# Patient Record
Sex: Female | Born: 1935 | Race: White | Hispanic: No | Smoking: Former smoker
Health system: Southern US, Community
[De-identification: ages and names within clinical notes are randomized; demographics above are authoritative.]

## PROBLEM LIST (undated history)

## (undated) DIAGNOSIS — Z9221 Personal history of antineoplastic chemotherapy: Secondary | ICD-10-CM

## (undated) DIAGNOSIS — H9190 Unspecified hearing loss, unspecified ear: Secondary | ICD-10-CM

## (undated) DIAGNOSIS — C139 Malignant neoplasm of hypopharynx, unspecified: Secondary | ICD-10-CM

## (undated) DIAGNOSIS — K5790 Diverticulosis of intestine, part unspecified, without perforation or abscess without bleeding: Secondary | ICD-10-CM

## (undated) DIAGNOSIS — C801 Malignant (primary) neoplasm, unspecified: Secondary | ICD-10-CM

## (undated) DIAGNOSIS — Z923 Personal history of irradiation: Secondary | ICD-10-CM

## (undated) DIAGNOSIS — C189 Malignant neoplasm of colon, unspecified: Secondary | ICD-10-CM

## (undated) DIAGNOSIS — K649 Unspecified hemorrhoids: Secondary | ICD-10-CM

## (undated) DIAGNOSIS — T884XXA Failed or difficult intubation, initial encounter: Secondary | ICD-10-CM

## (undated) DIAGNOSIS — Z8619 Personal history of other infectious and parasitic diseases: Secondary | ICD-10-CM

## (undated) DIAGNOSIS — M1990 Unspecified osteoarthritis, unspecified site: Secondary | ICD-10-CM

## (undated) DIAGNOSIS — R0600 Dyspnea, unspecified: Secondary | ICD-10-CM

## (undated) DIAGNOSIS — I10 Essential (primary) hypertension: Secondary | ICD-10-CM

## (undated) DIAGNOSIS — M8580 Other specified disorders of bone density and structure, unspecified site: Secondary | ICD-10-CM

## (undated) HISTORY — PX: ABDOMINAL HYSTERECTOMY: SHX81

## (undated) HISTORY — PX: JOINT REPLACEMENT: SHX530

## (undated) HISTORY — DX: Malignant neoplasm of hypopharynx, unspecified: C13.9

## (undated) HISTORY — DX: Malignant neoplasm of colon, unspecified: C18.9

## (undated) HISTORY — PX: BREAST CYST ASPIRATION: SHX578

## (undated) HISTORY — PX: APPENDECTOMY: SHX54

## (undated) HISTORY — DX: Diverticulosis of intestine, part unspecified, without perforation or abscess without bleeding: K57.90

## (undated) HISTORY — PX: BREAST SURGERY: SHX581

## (undated) HISTORY — PX: TONSILLECTOMY: SUR1361

## (undated) HISTORY — DX: Other specified disorders of bone density and structure, unspecified site: M85.80

## (undated) HISTORY — PX: COLONOSCOPY: SHX174

## (undated) HISTORY — DX: Personal history of other infectious and parasitic diseases: Z86.19

## (undated) HISTORY — DX: Unspecified hemorrhoids: K64.9

---
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
## 1958-11-02 HISTORY — PX: BREAST SURGERY: SHX581

## 1978-11-02 HISTORY — PX: ABDOMINAL HYSTERECTOMY: SHX81

## 2004-08-25 ENCOUNTER — Ambulatory Visit: Payer: Self-pay | Admitting: Family Medicine

## 2005-09-16 ENCOUNTER — Ambulatory Visit: Payer: Self-pay | Admitting: Family Medicine

## 2006-10-07 ENCOUNTER — Ambulatory Visit: Payer: Self-pay | Admitting: General Surgery

## 2007-10-14 LAB — HM DEXA SCAN

## 2007-11-09 ENCOUNTER — Ambulatory Visit: Payer: Self-pay | Admitting: General Surgery

## 2008-03-10 ENCOUNTER — Other Ambulatory Visit: Payer: Self-pay

## 2008-03-11 ENCOUNTER — Inpatient Hospital Stay: Payer: Self-pay | Admitting: Surgery

## 2008-03-11 HISTORY — PX: APPENDECTOMY: SHX54

## 2008-03-24 ENCOUNTER — Inpatient Hospital Stay: Payer: Self-pay | Admitting: Surgery

## 2008-04-02 ENCOUNTER — Emergency Department: Payer: Self-pay | Admitting: Emergency Medicine

## 2008-04-11 ENCOUNTER — Ambulatory Visit: Payer: Self-pay | Admitting: Family Medicine

## 2008-11-14 ENCOUNTER — Ambulatory Visit: Payer: Self-pay | Admitting: General Surgery

## 2009-12-30 ENCOUNTER — Ambulatory Visit: Payer: Self-pay | Admitting: Family Medicine

## 2010-11-02 DIAGNOSIS — C801 Malignant (primary) neoplasm, unspecified: Secondary | ICD-10-CM

## 2010-11-02 HISTORY — DX: Malignant (primary) neoplasm, unspecified: C80.1

## 2011-02-12 ENCOUNTER — Ambulatory Visit: Payer: Self-pay | Admitting: Family Medicine

## 2012-02-17 DIAGNOSIS — H2510 Age-related nuclear cataract, unspecified eye: Secondary | ICD-10-CM | POA: Diagnosis not present

## 2012-03-04 DIAGNOSIS — R5383 Other fatigue: Secondary | ICD-10-CM | POA: Diagnosis not present

## 2012-03-04 DIAGNOSIS — M899 Disorder of bone, unspecified: Secondary | ICD-10-CM | POA: Diagnosis not present

## 2012-03-04 DIAGNOSIS — M949 Disorder of cartilage, unspecified: Secondary | ICD-10-CM | POA: Diagnosis not present

## 2012-03-04 DIAGNOSIS — R5381 Other malaise: Secondary | ICD-10-CM | POA: Diagnosis not present

## 2012-03-04 DIAGNOSIS — I10 Essential (primary) hypertension: Secondary | ICD-10-CM | POA: Diagnosis not present

## 2012-03-04 DIAGNOSIS — E559 Vitamin D deficiency, unspecified: Secondary | ICD-10-CM | POA: Diagnosis not present

## 2012-03-04 DIAGNOSIS — L989 Disorder of the skin and subcutaneous tissue, unspecified: Secondary | ICD-10-CM | POA: Diagnosis not present

## 2012-03-10 ENCOUNTER — Ambulatory Visit: Payer: Self-pay | Admitting: Family Medicine

## 2012-03-10 DIAGNOSIS — Z1231 Encounter for screening mammogram for malignant neoplasm of breast: Secondary | ICD-10-CM | POA: Diagnosis not present

## 2012-04-28 DIAGNOSIS — L821 Other seborrheic keratosis: Secondary | ICD-10-CM | POA: Diagnosis not present

## 2012-05-03 ENCOUNTER — Ambulatory Visit: Payer: Self-pay | Admitting: Family Medicine

## 2012-05-03 DIAGNOSIS — J449 Chronic obstructive pulmonary disease, unspecified: Secondary | ICD-10-CM | POA: Diagnosis not present

## 2012-05-03 DIAGNOSIS — J40 Bronchitis, not specified as acute or chronic: Secondary | ICD-10-CM | POA: Diagnosis not present

## 2012-05-03 DIAGNOSIS — K649 Unspecified hemorrhoids: Secondary | ICD-10-CM | POA: Diagnosis not present

## 2012-05-03 DIAGNOSIS — R059 Cough, unspecified: Secondary | ICD-10-CM | POA: Diagnosis not present

## 2012-05-03 DIAGNOSIS — R195 Other fecal abnormalities: Secondary | ICD-10-CM | POA: Diagnosis not present

## 2012-05-03 DIAGNOSIS — R05 Cough: Secondary | ICD-10-CM | POA: Diagnosis not present

## 2012-05-24 DIAGNOSIS — K922 Gastrointestinal hemorrhage, unspecified: Secondary | ICD-10-CM | POA: Diagnosis not present

## 2012-08-02 DIAGNOSIS — J40 Bronchitis, not specified as acute or chronic: Secondary | ICD-10-CM | POA: Diagnosis not present

## 2012-08-02 DIAGNOSIS — Z23 Encounter for immunization: Secondary | ICD-10-CM | POA: Diagnosis not present

## 2012-08-02 DIAGNOSIS — R05 Cough: Secondary | ICD-10-CM | POA: Diagnosis not present

## 2012-12-22 DIAGNOSIS — R197 Diarrhea, unspecified: Secondary | ICD-10-CM | POA: Diagnosis not present

## 2012-12-26 DIAGNOSIS — K529 Noninfective gastroenteritis and colitis, unspecified: Secondary | ICD-10-CM | POA: Diagnosis not present

## 2012-12-26 LAB — HEPATIC FUNCTION PANEL
ALT: 12 U/L (ref 7–35)
AST: 20 U/L (ref 13–35)

## 2012-12-26 LAB — CBC AND DIFFERENTIAL
HEMATOCRIT: 42 % (ref 36–46)
HEMOGLOBIN: 13.8 g/dL (ref 12.0–16.0)
Platelets: 253 10*3/uL (ref 150–399)
WBC: 7.5 10^3/mL

## 2012-12-26 LAB — BASIC METABOLIC PANEL
BUN: 37 mg/dL — AB (ref 4–21)
Creatinine: 1.7 mg/dL — AB (ref 0.5–1.1)
Glucose: 118 mg/dL
Potassium: 4.1 mmol/L (ref 3.4–5.3)
SODIUM: 139 mmol/L (ref 137–147)

## 2012-12-26 LAB — HM SIGMOIDOSCOPY

## 2012-12-30 DIAGNOSIS — K922 Gastrointestinal hemorrhage, unspecified: Secondary | ICD-10-CM | POA: Diagnosis not present

## 2012-12-31 ENCOUNTER — Ambulatory Visit: Payer: Self-pay | Admitting: Oncology

## 2013-01-02 ENCOUNTER — Inpatient Hospital Stay: Payer: Self-pay | Admitting: Internal Medicine

## 2013-01-02 DIAGNOSIS — E876 Hypokalemia: Secondary | ICD-10-CM | POA: Diagnosis present

## 2013-01-02 DIAGNOSIS — N3000 Acute cystitis without hematuria: Secondary | ICD-10-CM | POA: Diagnosis not present

## 2013-01-02 DIAGNOSIS — D62 Acute posthemorrhagic anemia: Secondary | ICD-10-CM | POA: Diagnosis present

## 2013-01-02 DIAGNOSIS — K5731 Diverticulosis of large intestine without perforation or abscess with bleeding: Secondary | ICD-10-CM | POA: Diagnosis not present

## 2013-01-02 DIAGNOSIS — C20 Malignant neoplasm of rectum: Secondary | ICD-10-CM | POA: Diagnosis present

## 2013-01-02 DIAGNOSIS — K5289 Other specified noninfective gastroenteritis and colitis: Secondary | ICD-10-CM | POA: Diagnosis not present

## 2013-01-02 DIAGNOSIS — E860 Dehydration: Secondary | ICD-10-CM | POA: Diagnosis not present

## 2013-01-02 DIAGNOSIS — R109 Unspecified abdominal pain: Secondary | ICD-10-CM | POA: Diagnosis not present

## 2013-01-02 DIAGNOSIS — D126 Benign neoplasm of colon, unspecified: Secondary | ICD-10-CM | POA: Diagnosis present

## 2013-01-02 LAB — URINALYSIS, COMPLETE
Bilirubin,UR: NEGATIVE
Hyaline Cast: 14
Ketone: NEGATIVE
Protein: NEGATIVE

## 2013-01-02 LAB — PROTIME-INR: INR: 0.9

## 2013-01-02 LAB — CBC
HCT: 44.1 % (ref 35.0–47.0)
HGB: 14.4 g/dL (ref 12.0–16.0)
MCH: 30.5 pg (ref 26.0–34.0)
MCHC: 32.6 g/dL (ref 32.0–36.0)
Platelet: 261 10*3/uL (ref 150–440)
RDW: 13.1 % (ref 11.5–14.5)
WBC: 18.1 10*3/uL — ABNORMAL HIGH (ref 3.6–11.0)

## 2013-01-02 LAB — COMPREHENSIVE METABOLIC PANEL
Albumin: 2.8 g/dL — ABNORMAL LOW (ref 3.4–5.0)
Alkaline Phosphatase: 79 U/L (ref 50–136)
Anion Gap: 12 (ref 7–16)
BUN: 27 mg/dL — ABNORMAL HIGH (ref 7–18)
Bilirubin,Total: 0.3 mg/dL (ref 0.2–1.0)
Calcium, Total: 7.9 mg/dL — ABNORMAL LOW (ref 8.5–10.1)
Co2: 23 mmol/L (ref 21–32)
Creatinine: 1.1 mg/dL (ref 0.60–1.30)
EGFR (African American): 56 — ABNORMAL LOW
EGFR (Non-African Amer.): 48 — ABNORMAL LOW
Osmolality: 284 (ref 275–301)
Potassium: 2.2 mmol/L — CL (ref 3.5–5.1)
SGOT(AST): 25 U/L (ref 15–37)
SGPT (ALT): 14 U/L (ref 12–78)

## 2013-01-02 LAB — MAGNESIUM: Magnesium: 2.5 mg/dL — ABNORMAL HIGH

## 2013-01-03 LAB — BASIC METABOLIC PANEL
Creatinine: 1.57 mg/dL — ABNORMAL HIGH (ref 0.60–1.30)
EGFR (African American): 36 — ABNORMAL LOW
EGFR (Non-African Amer.): 31 — ABNORMAL LOW
Osmolality: 280 (ref 275–301)
Potassium: 3.4 mmol/L — ABNORMAL LOW (ref 3.5–5.1)
Sodium: 135 mmol/L — ABNORMAL LOW (ref 136–145)

## 2013-01-03 LAB — CBC WITH DIFFERENTIAL/PLATELET
Basophil #: 0 10*3/uL (ref 0.0–0.1)
Basophil %: 0.1 %
Eosinophil %: 0.1 %
HCT: 31 % — ABNORMAL LOW (ref 35.0–47.0)
HGB: 12.8 g/dL (ref 12.0–16.0)
Lymphocyte #: 1.4 10*3/uL (ref 1.0–3.6)
MCH: 30.9 pg (ref 26.0–34.0)
MCH: 32.2 pg (ref 26.0–34.0)
MCHC: 34.5 g/dL (ref 32.0–36.0)
MCV: 93 fL (ref 80–100)
MCV: 93 fL (ref 80–100)
Monocyte #: 0.9 x10 3/mm (ref 0.2–0.9)
Monocyte #: 1.1 x10 3/mm — ABNORMAL HIGH (ref 0.2–0.9)
Monocyte %: 6.5 %
Monocyte %: 9.7 %
Neutrophil #: 11.2 10*3/uL — ABNORMAL HIGH (ref 1.4–6.5)
Neutrophil %: 82.9 %
Neutrophil %: 76.5 %
RBC: 4.13 10*6/uL (ref 3.80–5.20)
RDW: 13.1 % (ref 11.5–14.5)
WBC: 11.1 10*3/uL — ABNORMAL HIGH (ref 3.6–11.0)

## 2013-01-03 LAB — HEMOGLOBIN: HGB: 11.8 g/dL — ABNORMAL LOW (ref 12.0–16.0)

## 2013-01-04 LAB — CBC WITH DIFFERENTIAL/PLATELET
Basophil #: 0 10*3/uL (ref 0.0–0.1)
Basophil %: 0.3 %
Eosinophil %: 0.4 %
HCT: 28.2 % — ABNORMAL LOW (ref 35.0–47.0)
HGB: 10.1 g/dL — ABNORMAL LOW (ref 12.0–16.0)
Lymphocyte #: 1.6 10*3/uL (ref 1.0–3.6)
Lymphocyte %: 16.7 %
Monocyte #: 0.9 x10 3/mm (ref 0.2–0.9)
Neutrophil #: 7.1 10*3/uL — ABNORMAL HIGH (ref 1.4–6.5)
RBC: 3.03 10*6/uL — ABNORMAL LOW (ref 3.80–5.20)
RDW: 13.2 % (ref 11.5–14.5)
WBC: 9.7 10*3/uL (ref 3.6–11.0)

## 2013-01-04 LAB — BASIC METABOLIC PANEL
Anion Gap: 8 (ref 7–16)
BUN: 18 mg/dL (ref 7–18)
Calcium, Total: 8.6 mg/dL (ref 8.5–10.1)
Chloride: 106 mmol/L (ref 98–107)
Co2: 25 mmol/L (ref 21–32)
Creatinine: 1.37 mg/dL — ABNORMAL HIGH (ref 0.60–1.30)
Glucose: 136 mg/dL — ABNORMAL HIGH (ref 65–99)
Osmolality: 282 (ref 275–301)
Potassium: 3 mmol/L — ABNORMAL LOW (ref 3.5–5.1)
Sodium: 139 mmol/L (ref 136–145)

## 2013-01-05 LAB — BASIC METABOLIC PANEL
Anion Gap: 7 (ref 7–16)
Chloride: 111 mmol/L — ABNORMAL HIGH (ref 98–107)
Creatinine: 1.35 mg/dL — ABNORMAL HIGH (ref 0.60–1.30)
EGFR (African American): 44 — ABNORMAL LOW
Glucose: 119 mg/dL — ABNORMAL HIGH (ref 65–99)
Potassium: 3.3 mmol/L — ABNORMAL LOW (ref 3.5–5.1)
Sodium: 139 mmol/L (ref 136–145)

## 2013-01-05 LAB — CBC WITH DIFFERENTIAL/PLATELET
Basophil #: 0 10*3/uL (ref 0.0–0.1)
Basophil %: 0.3 %
HCT: 27.7 % — ABNORMAL LOW (ref 35.0–47.0)
Lymphocyte #: 1.9 10*3/uL (ref 1.0–3.6)
MCHC: 33.6 g/dL (ref 32.0–36.0)
MCV: 94 fL (ref 80–100)
Monocyte #: 0.9 x10 3/mm (ref 0.2–0.9)
Monocyte %: 10 %
RBC: 2.96 10*6/uL — ABNORMAL LOW (ref 3.80–5.20)
RDW: 13.2 % (ref 11.5–14.5)
WBC: 8.9 10*3/uL (ref 3.6–11.0)

## 2013-01-05 LAB — HM COLONOSCOPY

## 2013-01-06 LAB — CBC WITH DIFFERENTIAL/PLATELET
Eosinophil #: 0.2 10*3/uL (ref 0.0–0.7)
Eosinophil %: 1.9 %
HCT: 26.5 % — ABNORMAL LOW (ref 35.0–47.0)
HGB: 8.9 g/dL — ABNORMAL LOW (ref 12.0–16.0)
Lymphocyte %: 27.3 %
MCHC: 33.7 g/dL (ref 32.0–36.0)
MCV: 94 fL (ref 80–100)
Neutrophil #: 4.6 10*3/uL (ref 1.4–6.5)
RBC: 2.84 10*6/uL — ABNORMAL LOW (ref 3.80–5.20)
RDW: 14 % (ref 11.5–14.5)
WBC: 7.7 10*3/uL (ref 3.6–11.0)

## 2013-01-06 LAB — BASIC METABOLIC PANEL
Anion Gap: 10 (ref 7–16)
BUN: 7 mg/dL (ref 7–18)
Calcium, Total: 8.3 mg/dL — ABNORMAL LOW (ref 8.5–10.1)
Creatinine: 1.2 mg/dL (ref 0.60–1.30)
EGFR (African American): 50 — ABNORMAL LOW
EGFR (Non-African Amer.): 44 — ABNORMAL LOW
Osmolality: 281 (ref 275–301)
Potassium: 4.1 mmol/L (ref 3.5–5.1)
Sodium: 142 mmol/L (ref 136–145)

## 2013-01-07 LAB — CBC WITH DIFFERENTIAL/PLATELET
Basophil #: 0 10*3/uL (ref 0.0–0.1)
Basophil %: 0.4 %
MCV: 93 fL (ref 80–100)
Monocyte #: 0.8 x10 3/mm (ref 0.2–0.9)
Monocyte %: 11.5 %
Neutrophil #: 3.9 10*3/uL (ref 1.4–6.5)
Neutrophil %: 59.2 %
Platelet: 175 10*3/uL (ref 150–440)
RBC: 2.84 10*6/uL — ABNORMAL LOW (ref 3.80–5.20)
RDW: 13.5 % (ref 11.5–14.5)
WBC: 6.6 10*3/uL (ref 3.6–11.0)

## 2013-01-09 LAB — CBC WITH DIFFERENTIAL/PLATELET
Basophil %: 0.6 %
Eosinophil %: 1.9 %
HCT: 26.2 % — ABNORMAL LOW (ref 35.0–47.0)
Lymphocyte #: 1.7 10*3/uL (ref 1.0–3.6)
Lymphocyte %: 26.5 %
MCH: 31.7 pg (ref 26.0–34.0)
MCV: 93 fL (ref 80–100)
Monocyte #: 0.8 x10 3/mm (ref 0.2–0.9)
Monocyte %: 13.1 %
Neutrophil #: 3.7 10*3/uL (ref 1.4–6.5)
Platelet: 184 10*3/uL (ref 150–440)
RBC: 2.81 10*6/uL — ABNORMAL LOW (ref 3.80–5.20)
RDW: 13.6 % (ref 11.5–14.5)
WBC: 6.4 10*3/uL (ref 3.6–11.0)

## 2013-01-09 LAB — CREATININE, SERUM: EGFR (African American): 48 — ABNORMAL LOW

## 2013-01-11 ENCOUNTER — Ambulatory Visit: Payer: Self-pay | Admitting: Oncology

## 2013-01-11 DIAGNOSIS — Z79899 Other long term (current) drug therapy: Secondary | ICD-10-CM | POA: Diagnosis not present

## 2013-01-11 DIAGNOSIS — K649 Unspecified hemorrhoids: Secondary | ICD-10-CM | POA: Diagnosis not present

## 2013-01-11 DIAGNOSIS — M7989 Other specified soft tissue disorders: Secondary | ICD-10-CM | POA: Diagnosis not present

## 2013-01-11 DIAGNOSIS — K5730 Diverticulosis of large intestine without perforation or abscess without bleeding: Secondary | ICD-10-CM | POA: Diagnosis not present

## 2013-01-11 DIAGNOSIS — I10 Essential (primary) hypertension: Secondary | ICD-10-CM | POA: Diagnosis not present

## 2013-01-11 DIAGNOSIS — C20 Malignant neoplasm of rectum: Secondary | ICD-10-CM | POA: Diagnosis not present

## 2013-01-11 LAB — PATHOLOGY REPORT

## 2013-01-16 ENCOUNTER — Other Ambulatory Visit: Payer: Self-pay

## 2013-01-16 ENCOUNTER — Telehealth: Payer: Self-pay

## 2013-01-16 NOTE — Telephone Encounter (Signed)
Pt has been instructed and meds reviewed paperwork was also faxed to her son Jonny Ruiz at [phone] per pt request Son is also aware of the appt

## 2013-01-20 ENCOUNTER — Ambulatory Visit (HOSPITAL_COMMUNITY)
Admission: RE | Admit: 2013-01-20 | Discharge: 2013-01-20 | Disposition: A | Discharge status: Home / Self Care | Payer: Medicare Other | Source: Ambulatory Visit | Attending: Gastroenterology | Admitting: Gastroenterology

## 2013-01-20 ENCOUNTER — Encounter (HOSPITAL_COMMUNITY): Payer: Self-pay | Admitting: *Deleted

## 2013-01-20 ENCOUNTER — Encounter (HOSPITAL_COMMUNITY)
Admission: RE | Disposition: A | Discharge status: Home / Self Care | Payer: Self-pay | Source: Ambulatory Visit | Attending: Gastroenterology

## 2013-01-20 DIAGNOSIS — K625 Hemorrhage of anus and rectum: Secondary | ICD-10-CM | POA: Insufficient documentation

## 2013-01-20 DIAGNOSIS — Z9089 Acquired absence of other organs: Secondary | ICD-10-CM | POA: Insufficient documentation

## 2013-01-20 DIAGNOSIS — I10 Essential (primary) hypertension: Secondary | ICD-10-CM | POA: Diagnosis not present

## 2013-01-20 DIAGNOSIS — D129 Benign neoplasm of anus and anal canal: Secondary | ICD-10-CM | POA: Diagnosis not present

## 2013-01-20 DIAGNOSIS — R198 Other specified symptoms and signs involving the digestive system and abdomen: Secondary | ICD-10-CM | POA: Diagnosis not present

## 2013-01-20 DIAGNOSIS — C20 Malignant neoplasm of rectum: Secondary | ICD-10-CM | POA: Diagnosis not present

## 2013-01-20 DIAGNOSIS — D128 Benign neoplasm of rectum: Secondary | ICD-10-CM | POA: Diagnosis not present

## 2013-01-20 HISTORY — DX: Unspecified osteoarthritis, unspecified site: M19.90

## 2013-01-20 HISTORY — DX: Malignant (primary) neoplasm, unspecified: C80.1

## 2013-01-20 HISTORY — PX: EUS: SHX5427

## 2013-01-20 HISTORY — DX: Essential (primary) hypertension: I10

## 2013-01-20 SURGERY — ULTRASOUND, LOWER GI TRACT, ENDOSCOPIC
Anesthesia: Moderate Sedation

## 2013-01-20 MED ORDER — SODIUM CHLORIDE 0.9 % IV SOLN
INTRAVENOUS | Status: DC
  Administered 2013-01-20: 500 mL via INTRAVENOUS

## 2013-01-20 MED ORDER — FENTANYL CITRATE 0.05 MG/ML IJ SOLN
INTRAMUSCULAR | Status: AC
  Filled 2013-01-20: qty 2

## 2013-01-20 MED ORDER — FENTANYL CITRATE 0.05 MG/ML IJ SOLN
INTRAMUSCULAR | Status: DC | PRN
  Administered 2013-01-20: 25 ug via INTRAVENOUS

## 2013-01-20 MED ORDER — MIDAZOLAM HCL 10 MG/2ML IJ SOLN
INTRAMUSCULAR | Status: DC | PRN
  Administered 2013-01-20: 2 mg via INTRAVENOUS

## 2013-01-20 MED ORDER — MIDAZOLAM HCL 10 MG/2ML IJ SOLN
INTRAMUSCULAR | Status: AC
  Filled 2013-01-20: qty 2

## 2013-01-20 MED ORDER — FLEET ENEMA 7-19 GM/118ML RE ENEM: 1.0000 | ENEMA | Freq: Once | RECTAL | Status: DC

## 2013-01-20 NOTE — OR Nursing (Signed)
Patient arrived at 9:00 am for her 12:00 appt stating that she had a half teaspoon of blood in her stool after drinking Mag citrate and wanted to be in a controlled environment for the enema.  Spoke with Clayborne Dana at [facility] who wanted the enemas given 1 hour prior to the procedure.  The patient is admitted to the department at 9:15am with the enemas administered at 9:45am.  Results - small amount of blood in toilet.  Patient tolerated enemas with minimal amount of discomfort.

## 2013-01-20 NOTE — H&P (Signed)
  HPI: This is a woman with rectal bleeding, underwent flex sig earlier this month, found to have several polyps (Iftikhar) and malignant apperaing rectal mass. PET showed no distant disease.    Past Medical History  Diagnosis Date  . Hypertension   . Cancer     rectal  . Arthritis     fingers    Past Surgical History  Procedure Laterality Date  . Breast surgery      cyst removal  . Appendectomy    . Abdominal hysterectomy    . Tonsillectomy      Current Facility-Administered Medications  Medication Dose Route Frequency Provider Last Rate Last Dose  . 0.9 %  sodium chloride infusion   Intravenous Continuous Rachael Fee, MD      . sodium phosphate (FLEET) 7-19 GM/118ML enema 1 enema  1 enema Rectal Once Rachael Fee, MD        Allergies as of 01/16/2013  . (Not on File)    History reviewed. No pertinent family history.  History   Social History  . Marital Status: Widowed    Spouse Name: N/A    Number of Children: N/A  . Years of Education: N/A   Occupational History  . Not on file.   Social History Main Topics  . Smoking status: Current Every Day Smoker -- 0.50 packs/day    Types: Cigarettes  . Smokeless tobacco: Never Used  . Alcohol Use: Yes  . Drug Use: No  . Sexually Active: Not on file   Other Topics Concern  . Not on file   Social History Narrative  . No narrative on file      Physical Exam: BP 146/74  Temp(Src) 97.5 F (36.4 C) (Oral)  Resp 23  Ht 5\' 5"  (1.651 m)  Wt 118 lb (53.524 kg)  BMI 19.64 kg/m2  SpO2 97% Constitutional: generally well-appearing Psychiatric: alert and oriented x3 Abdomen: soft, nontender, nondistended, no obvious ascites, no peritoneal signs, normal bowel sounds     Assessment and plan: 79 y.o. female with likely rectal cancer  For lower EUS today (staging and repeat biopsy of mass)

## 2013-01-23 ENCOUNTER — Encounter (HOSPITAL_COMMUNITY): Payer: Self-pay | Admitting: Gastroenterology

## 2013-01-26 DIAGNOSIS — M7989 Other specified soft tissue disorders: Secondary | ICD-10-CM | POA: Diagnosis not present

## 2013-01-26 DIAGNOSIS — C20 Malignant neoplasm of rectum: Secondary | ICD-10-CM | POA: Diagnosis not present

## 2013-01-26 LAB — COMPREHENSIVE METABOLIC PANEL
Anion Gap: 10 (ref 7–16)
BUN: 36 mg/dL — ABNORMAL HIGH (ref 7–18)
Calcium, Total: 10.6 mg/dL — ABNORMAL HIGH (ref 8.5–10.1)
Chloride: 102 mmol/L (ref 98–107)
Creatinine: 1.9 mg/dL — ABNORMAL HIGH (ref 0.60–1.30)
EGFR (African American): 29 — ABNORMAL LOW
EGFR (Non-African Amer.): 25 — ABNORMAL LOW
Glucose: 105 mg/dL — ABNORMAL HIGH (ref 65–99)
Osmolality: 288 (ref 275–301)
Potassium: 3.8 mmol/L (ref 3.5–5.1)
SGOT(AST): 25 U/L (ref 15–37)
SGPT (ALT): 24 U/L (ref 12–78)
Sodium: 140 mmol/L (ref 136–145)

## 2013-01-26 LAB — CBC CANCER CENTER
Basophil #: 0.1 x10 3/mm (ref 0.0–0.1)
Eosinophil %: 0.9 %
HCT: 37.2 % (ref 35.0–47.0)
HGB: 12.3 g/dL (ref 12.0–16.0)
MCHC: 33.2 g/dL (ref 32.0–36.0)
MCV: 95 fL (ref 80–100)
Monocyte #: 1.2 x10 3/mm — ABNORMAL HIGH (ref 0.2–0.9)
Monocyte %: 11.8 %
Neutrophil %: 65.3 %
Platelet: 313 x10 3/mm (ref 150–440)
RBC: 3.92 10*6/uL (ref 3.80–5.20)
RDW: 14.6 % — ABNORMAL HIGH (ref 11.5–14.5)
WBC: 10.3 x10 3/mm (ref 3.6–11.0)

## 2013-01-27 DIAGNOSIS — R198 Other specified symptoms and signs involving the digestive system and abdomen: Secondary | ICD-10-CM | POA: Diagnosis not present

## 2013-01-30 NOTE — Op Note (Signed)
[facility] [address] [place], [address]   ENDOSCOPIC ULTRASOUND PROCEDURE REPORT  PATIENT: NESIAH, JUMP  MR#: [id number] BIRTHDATE: [birth year]  GENDER: Female ENDOSCOPIST: Rachael Fee, MD REFERRED BY:  Sallee Lange, M.D. PROCEDURE DATE:  01/20/2013 PROCEDURE:   Lower EUS  , flex sig with biopsy ASA CLASS:      Class III INDICATIONS:   1.  newly diagnosed rectal mass, previous biopsies Dr.  Niel Hummer did not prove invasive cancer on incomplete colonoscopy; PET avid rectal lesion, no clear distant disease. MEDICATIONS: Fentanyl 25 mcg IV and Versed 2 mg IV  DESCRIPTION OF PROCEDURE:   After the risks benefits and alternatives of the procedure were  explained, informed consent was obtained. The patient was then placed in the left, lateral, decubitus postion and IV sedation was administered. Throughout the procedure, the patients blood pressure, pulse and oxygen saturations were monitored continuously.  Under direct visualization, the PENTAX EUS SCOPE  endoscope was introduced through the anus  and advanced to the sigmoid colon .  Water was used as necessary to provide an acoustic interface.  Upon completion of the imaging, water was removed and the patient was sent to the recovery room in satisfactory condition.   Sigmoidoscopy findings: 1. Large, bulky, friable mass in rectum with distal edge 5-6cm from anal verge. The mass was non-circumferential but somewhat obstructing given its size (4cm). This was biopsied extensively following EUS examination, sent to pathology.  EUS findings: 1. The mass above was laying on a rectal fold and imaging was somewhat tangential. This can lead to erroneous [place] T staging, however I did not see clear invasion through the muscularis propria layer. The mass does invade into the muscularis propria and so is uT2 staging with the above caveat. 2. No perirectal adenopathy  Impression: uT2N0 rectal adencarcinoma  (repeat biopsies taken today to prove invasive nature). This is 4cm across, non-circumferential and distal edge is 5-6cm from anal verge.  Should consider surgical resection as first therapy.  I will communicate these recommendations with Dr. Doylene Canning at [facility].  She has not had complete colonoscopy evaluation and that should be considered as well.  _______________________________ eSigned:  Rachael Fee, MD 01/20/2013 1:50 PM Revised: 01/20/2013 1:50 PM

## 2013-01-31 ENCOUNTER — Ambulatory Visit: Payer: Self-pay | Admitting: Oncology

## 2013-01-31 DIAGNOSIS — C20 Malignant neoplasm of rectum: Secondary | ICD-10-CM | POA: Diagnosis not present

## 2013-02-02 DIAGNOSIS — Z79899 Other long term (current) drug therapy: Secondary | ICD-10-CM | POA: Diagnosis not present

## 2013-02-02 DIAGNOSIS — M7989 Other specified soft tissue disorders: Secondary | ICD-10-CM | POA: Diagnosis not present

## 2013-02-02 DIAGNOSIS — I10 Essential (primary) hypertension: Secondary | ICD-10-CM | POA: Diagnosis not present

## 2013-02-02 DIAGNOSIS — C20 Malignant neoplasm of rectum: Secondary | ICD-10-CM | POA: Diagnosis not present

## 2013-02-02 DIAGNOSIS — K5730 Diverticulosis of large intestine without perforation or abscess without bleeding: Secondary | ICD-10-CM | POA: Diagnosis not present

## 2013-02-02 DIAGNOSIS — R630 Anorexia: Secondary | ICD-10-CM | POA: Diagnosis not present

## 2013-02-02 DIAGNOSIS — Z5111 Encounter for antineoplastic chemotherapy: Secondary | ICD-10-CM | POA: Diagnosis not present

## 2013-02-02 DIAGNOSIS — K649 Unspecified hemorrhoids: Secondary | ICD-10-CM | POA: Diagnosis not present

## 2013-02-02 DIAGNOSIS — Z510 Encounter for antineoplastic radiation therapy: Secondary | ICD-10-CM | POA: Diagnosis not present

## 2013-02-06 ENCOUNTER — Encounter: Payer: Self-pay | Admitting: General Surgery

## 2013-02-06 ENCOUNTER — Ambulatory Visit (INDEPENDENT_AMBULATORY_CARE_PROVIDER_SITE_OTHER): Payer: Medicare Other | Admitting: General Surgery

## 2013-02-06 VITALS — BP 128/60 | HR 70 | Resp 16 | Ht 65.0 in | Wt 119.0 lb

## 2013-02-06 DIAGNOSIS — Z79899 Other long term (current) drug therapy: Secondary | ICD-10-CM | POA: Diagnosis not present

## 2013-02-06 DIAGNOSIS — Z5111 Encounter for antineoplastic chemotherapy: Secondary | ICD-10-CM | POA: Diagnosis not present

## 2013-02-06 DIAGNOSIS — C20 Malignant neoplasm of rectum: Secondary | ICD-10-CM | POA: Diagnosis not present

## 2013-02-06 DIAGNOSIS — K5730 Diverticulosis of large intestine without perforation or abscess without bleeding: Secondary | ICD-10-CM | POA: Diagnosis not present

## 2013-02-06 DIAGNOSIS — I10 Essential (primary) hypertension: Secondary | ICD-10-CM | POA: Diagnosis not present

## 2013-02-06 DIAGNOSIS — Z510 Encounter for antineoplastic radiation therapy: Secondary | ICD-10-CM | POA: Diagnosis not present

## 2013-02-06 DIAGNOSIS — K649 Unspecified hemorrhoids: Secondary | ICD-10-CM | POA: Diagnosis not present

## 2013-02-06 DIAGNOSIS — R630 Anorexia: Secondary | ICD-10-CM | POA: Diagnosis not present

## 2013-02-06 DIAGNOSIS — M7989 Other specified soft tissue disorders: Secondary | ICD-10-CM | POA: Diagnosis not present

## 2013-02-06 NOTE — Patient Instructions (Addendum)
The risks associated with central venous access including arterial, pulmonary and venous injury were reviewed. The possible need for additional treatment if pulmonary injury occurs (chest tube placement) was discussed.  Patient's surgery has been scheduled for 02-09-13 at [facility].

## 2013-02-06 NOTE — Progress Notes (Signed)
  Patient ID: Emily [name], female   DOB: 1932-12-08, 79 y.o.   MRN: [id number]  Chief Complaint  Patient presents with  . Other    eval port placement    HPI Emily [name] is a 79 y.o. female here for a evaluation of a port placement.Marland Kitchen HPI  Past Medical History  Diagnosis Date  . Hypertension   . Cancer     rectal  . Arthritis     fingers    Past Surgical History  Procedure Laterality Date  . Breast surgery      cyst removal  . Appendectomy    . Abdominal hysterectomy    . Tonsillectomy    . Eus N/A 01/20/2013    Procedure: LOWER ENDOSCOPIC ULTRASOUND (EUS);  Surgeon: Rachael Fee, MD;  Location: [facility];  Service: Endoscopy;  Laterality: N/A;  . Colonoscopy      No family history on file.  Social History History  Substance Use Topics  . Smoking status: Current Every Day Smoker -- 0.50 packs/day    Types: Cigarettes  . Smokeless tobacco: Never Used  . Alcohol Use: Yes    No Known Allergies  Current Outpatient Prescriptions  Medication Sig Dispense Refill  . Ferrous Sulfate (IRON SUPPLEMENT PO) Take 65 mg by mouth 3 (three) times daily.      . metoprolol tartrate (LOPRESSOR) 25 MG tablet Take 25 mg by mouth 2 (two) times daily. Took 1/2 tab      . pantoprazole (PROTONIX) 40 MG tablet Take 40 mg by mouth daily.      . potassium chloride SA (KLOR-CON M20) 20 MEQ tablet Take 20 mEq by mouth 2 (two) times daily.      Marland Kitchen triamterene-hydrochlorothiazide (MAXZIDE) 75-50 MG per tablet Take 1 tablet by mouth daily.       No current facility-administered medications for this visit.    Review of Systems Review of Systems  Constitutional: Negative.   Respiratory: Negative.   Cardiovascular: Negative.     Blood pressure 128/60, pulse 70, resp. rate 16, height 5\' 5"  (1.651 m), weight 119 lb (53.978 kg).  Physical Exam Physical Exam  Constitutional: She appears well-developed and well-nourished.  Eyes: Conjunctivae are normal. Pupils are equal,  round, and reactive to light.  Neck: Normal range of motion. Neck supple.  Cardiovascular: Normal rate, regular rhythm and normal heart sounds.   Pulmonary/Chest: Effort normal and breath sounds normal.  Abdominal: Soft. Bowel sounds are normal.   no evidence of venous obstruction or arm swelling noted on either side. Data Reviewed Laboratory studies dated 01/26/2013 showed a hemoglobin of 12.3, white blood cell count 10,300, platelet count 3 13,000. Electrolytes were notable for an elevated serum creatinine of 1.9 and a serum calcium of 10.6. Liver function studies were normal. Estimated GFR is depressed at 25. Pro time dated 01/02/2013 was 12.5 with an INR of 0.9. Previous colonoscopy showed evidence of a 6 cm mass at approximately 6-8 cm above the anal verge. He'll S. #1 showed a T2 N0 lesion, EUS #2 showed a T3, N1 lesion. CEA elevated at 63. Assessment    Advanced rectal cancer, candidate for neoadjuvant chemoradiation. Need for central venous access.    Plan    Power port placement was reviewed. The patient is right-hand dominant and likely make use of the left subclavian vein.       Ples Specter 02/06/2013, 3:25 PM

## 2013-02-07 ENCOUNTER — Encounter: Payer: Self-pay | Admitting: General Surgery

## 2013-02-07 ENCOUNTER — Other Ambulatory Visit: Payer: Self-pay | Admitting: General Surgery

## 2013-02-07 DIAGNOSIS — C20 Malignant neoplasm of rectum: Secondary | ICD-10-CM | POA: Diagnosis not present

## 2013-02-07 DIAGNOSIS — Z510 Encounter for antineoplastic radiation therapy: Secondary | ICD-10-CM | POA: Diagnosis not present

## 2013-02-07 DIAGNOSIS — M7989 Other specified soft tissue disorders: Secondary | ICD-10-CM | POA: Diagnosis not present

## 2013-02-07 DIAGNOSIS — Z79899 Other long term (current) drug therapy: Secondary | ICD-10-CM | POA: Diagnosis not present

## 2013-02-07 DIAGNOSIS — R630 Anorexia: Secondary | ICD-10-CM | POA: Diagnosis not present

## 2013-02-07 DIAGNOSIS — Z5111 Encounter for antineoplastic chemotherapy: Secondary | ICD-10-CM | POA: Diagnosis not present

## 2013-02-09 ENCOUNTER — Ambulatory Visit: Payer: Self-pay | Admitting: General Surgery

## 2013-02-09 ENCOUNTER — Encounter: Payer: Self-pay | Admitting: *Deleted

## 2013-02-09 DIAGNOSIS — C785 Secondary malignant neoplasm of large intestine and rectum: Secondary | ICD-10-CM | POA: Diagnosis not present

## 2013-02-09 DIAGNOSIS — Z452 Encounter for adjustment and management of vascular access device: Secondary | ICD-10-CM | POA: Diagnosis not present

## 2013-02-09 DIAGNOSIS — M129 Arthropathy, unspecified: Secondary | ICD-10-CM | POA: Diagnosis not present

## 2013-02-09 DIAGNOSIS — R609 Edema, unspecified: Secondary | ICD-10-CM | POA: Diagnosis not present

## 2013-02-09 DIAGNOSIS — C20 Malignant neoplasm of rectum: Secondary | ICD-10-CM | POA: Diagnosis not present

## 2013-02-09 DIAGNOSIS — R059 Cough, unspecified: Secondary | ICD-10-CM | POA: Diagnosis not present

## 2013-02-09 DIAGNOSIS — I10 Essential (primary) hypertension: Secondary | ICD-10-CM | POA: Diagnosis not present

## 2013-02-09 DIAGNOSIS — F17200 Nicotine dependence, unspecified, uncomplicated: Secondary | ICD-10-CM | POA: Diagnosis not present

## 2013-02-09 DIAGNOSIS — Z79899 Other long term (current) drug therapy: Secondary | ICD-10-CM | POA: Diagnosis not present

## 2013-02-13 ENCOUNTER — Encounter: Payer: Self-pay | Admitting: General Surgery

## 2013-02-13 DIAGNOSIS — M7989 Other specified soft tissue disorders: Secondary | ICD-10-CM | POA: Diagnosis not present

## 2013-02-13 DIAGNOSIS — Z510 Encounter for antineoplastic radiation therapy: Secondary | ICD-10-CM | POA: Diagnosis not present

## 2013-02-13 DIAGNOSIS — R630 Anorexia: Secondary | ICD-10-CM | POA: Diagnosis not present

## 2013-02-13 DIAGNOSIS — Z79899 Other long term (current) drug therapy: Secondary | ICD-10-CM | POA: Diagnosis not present

## 2013-02-13 DIAGNOSIS — Z5111 Encounter for antineoplastic chemotherapy: Secondary | ICD-10-CM | POA: Diagnosis not present

## 2013-02-13 DIAGNOSIS — C20 Malignant neoplasm of rectum: Secondary | ICD-10-CM | POA: Diagnosis not present

## 2013-02-13 LAB — COMPREHENSIVE METABOLIC PANEL
Alkaline Phosphatase: 71 U/L (ref 50–136)
Anion Gap: 13 (ref 7–16)
BUN: 31 mg/dL — ABNORMAL HIGH (ref 7–18)
Calcium, Total: 10.3 mg/dL — ABNORMAL HIGH (ref 8.5–10.1)
Chloride: 101 mmol/L (ref 98–107)
Co2: 27 mmol/L (ref 21–32)
Creatinine: 1.61 mg/dL — ABNORMAL HIGH (ref 0.60–1.30)
Glucose: 133 mg/dL — ABNORMAL HIGH (ref 65–99)
Osmolality: 290 (ref 275–301)
Potassium: 3.2 mmol/L — ABNORMAL LOW (ref 3.5–5.1)
SGOT(AST): 21 U/L (ref 15–37)
Total Protein: 6.8 g/dL (ref 6.4–8.2)

## 2013-02-13 LAB — CBC CANCER CENTER
Basophil #: 0 x10 3/mm (ref 0.0–0.1)
HGB: 11.2 g/dL — ABNORMAL LOW (ref 12.0–16.0)
Lymphocyte #: 2.5 x10 3/mm (ref 1.0–3.6)
Lymphocyte %: 34.4 %
MCV: 94 fL (ref 80–100)
Monocyte #: 0.7 x10 3/mm (ref 0.2–0.9)
Monocyte %: 9 %
Neutrophil %: 54.5 %
Platelet: 184 x10 3/mm (ref 150–440)
RBC: 3.53 10*6/uL — ABNORMAL LOW (ref 3.80–5.20)
WBC: 7.3 x10 3/mm (ref 3.6–11.0)

## 2013-02-14 DIAGNOSIS — Z5111 Encounter for antineoplastic chemotherapy: Secondary | ICD-10-CM | POA: Diagnosis not present

## 2013-02-14 DIAGNOSIS — R630 Anorexia: Secondary | ICD-10-CM | POA: Diagnosis not present

## 2013-02-14 DIAGNOSIS — C20 Malignant neoplasm of rectum: Secondary | ICD-10-CM | POA: Diagnosis not present

## 2013-02-14 DIAGNOSIS — Z510 Encounter for antineoplastic radiation therapy: Secondary | ICD-10-CM | POA: Diagnosis not present

## 2013-02-14 DIAGNOSIS — M7989 Other specified soft tissue disorders: Secondary | ICD-10-CM | POA: Diagnosis not present

## 2013-02-14 DIAGNOSIS — Z79899 Other long term (current) drug therapy: Secondary | ICD-10-CM | POA: Diagnosis not present

## 2013-02-15 DIAGNOSIS — Z510 Encounter for antineoplastic radiation therapy: Secondary | ICD-10-CM | POA: Diagnosis not present

## 2013-02-15 DIAGNOSIS — Z5111 Encounter for antineoplastic chemotherapy: Secondary | ICD-10-CM | POA: Diagnosis not present

## 2013-02-15 DIAGNOSIS — R630 Anorexia: Secondary | ICD-10-CM | POA: Diagnosis not present

## 2013-02-15 DIAGNOSIS — M7989 Other specified soft tissue disorders: Secondary | ICD-10-CM | POA: Diagnosis not present

## 2013-02-15 DIAGNOSIS — Z79899 Other long term (current) drug therapy: Secondary | ICD-10-CM | POA: Diagnosis not present

## 2013-02-15 DIAGNOSIS — C20 Malignant neoplasm of rectum: Secondary | ICD-10-CM | POA: Diagnosis not present

## 2013-02-16 DIAGNOSIS — C20 Malignant neoplasm of rectum: Secondary | ICD-10-CM | POA: Diagnosis not present

## 2013-02-16 DIAGNOSIS — Z510 Encounter for antineoplastic radiation therapy: Secondary | ICD-10-CM | POA: Diagnosis not present

## 2013-02-16 DIAGNOSIS — R630 Anorexia: Secondary | ICD-10-CM | POA: Diagnosis not present

## 2013-02-16 DIAGNOSIS — Z79899 Other long term (current) drug therapy: Secondary | ICD-10-CM | POA: Diagnosis not present

## 2013-02-16 DIAGNOSIS — Z5111 Encounter for antineoplastic chemotherapy: Secondary | ICD-10-CM | POA: Diagnosis not present

## 2013-02-16 DIAGNOSIS — M7989 Other specified soft tissue disorders: Secondary | ICD-10-CM | POA: Diagnosis not present

## 2013-02-18 DIAGNOSIS — R630 Anorexia: Secondary | ICD-10-CM | POA: Diagnosis not present

## 2013-02-18 DIAGNOSIS — Z510 Encounter for antineoplastic radiation therapy: Secondary | ICD-10-CM | POA: Diagnosis not present

## 2013-02-18 DIAGNOSIS — M7989 Other specified soft tissue disorders: Secondary | ICD-10-CM | POA: Diagnosis not present

## 2013-02-18 DIAGNOSIS — Z5111 Encounter for antineoplastic chemotherapy: Secondary | ICD-10-CM | POA: Diagnosis not present

## 2013-02-18 DIAGNOSIS — Z79899 Other long term (current) drug therapy: Secondary | ICD-10-CM | POA: Diagnosis not present

## 2013-02-18 DIAGNOSIS — C20 Malignant neoplasm of rectum: Secondary | ICD-10-CM | POA: Diagnosis not present

## 2013-02-20 DIAGNOSIS — Z510 Encounter for antineoplastic radiation therapy: Secondary | ICD-10-CM | POA: Diagnosis not present

## 2013-02-20 DIAGNOSIS — M7989 Other specified soft tissue disorders: Secondary | ICD-10-CM | POA: Diagnosis not present

## 2013-02-20 DIAGNOSIS — R630 Anorexia: Secondary | ICD-10-CM | POA: Diagnosis not present

## 2013-02-20 DIAGNOSIS — Z79899 Other long term (current) drug therapy: Secondary | ICD-10-CM | POA: Diagnosis not present

## 2013-02-20 DIAGNOSIS — Z5111 Encounter for antineoplastic chemotherapy: Secondary | ICD-10-CM | POA: Diagnosis not present

## 2013-02-20 DIAGNOSIS — C20 Malignant neoplasm of rectum: Secondary | ICD-10-CM | POA: Diagnosis not present

## 2013-02-20 LAB — CBC CANCER CENTER
Basophil #: 0 x10 3/mm (ref 0.0–0.1)
Basophil %: 0.5 %
Eosinophil %: 2.6 %
HCT: 33.5 % — ABNORMAL LOW (ref 35.0–47.0)
Lymphocyte %: 33.4 %
MCH: 31.5 pg (ref 26.0–34.0)
MCHC: 33.4 g/dL (ref 32.0–36.0)
MCV: 94 fL (ref 80–100)
Monocyte #: 0.2 x10 3/mm (ref 0.2–0.9)
Monocyte %: 4.5 %
RBC: 3.55 10*6/uL — ABNORMAL LOW (ref 3.80–5.20)
RDW: 13.7 % (ref 11.5–14.5)

## 2013-02-21 DIAGNOSIS — Z79899 Other long term (current) drug therapy: Secondary | ICD-10-CM | POA: Diagnosis not present

## 2013-02-21 DIAGNOSIS — Z5111 Encounter for antineoplastic chemotherapy: Secondary | ICD-10-CM | POA: Diagnosis not present

## 2013-02-21 DIAGNOSIS — R630 Anorexia: Secondary | ICD-10-CM | POA: Diagnosis not present

## 2013-02-21 DIAGNOSIS — Z510 Encounter for antineoplastic radiation therapy: Secondary | ICD-10-CM | POA: Diagnosis not present

## 2013-02-21 DIAGNOSIS — M7989 Other specified soft tissue disorders: Secondary | ICD-10-CM | POA: Diagnosis not present

## 2013-02-21 DIAGNOSIS — C20 Malignant neoplasm of rectum: Secondary | ICD-10-CM | POA: Diagnosis not present

## 2013-02-22 DIAGNOSIS — M7989 Other specified soft tissue disorders: Secondary | ICD-10-CM | POA: Diagnosis not present

## 2013-02-22 DIAGNOSIS — R630 Anorexia: Secondary | ICD-10-CM | POA: Diagnosis not present

## 2013-02-22 DIAGNOSIS — Z79899 Other long term (current) drug therapy: Secondary | ICD-10-CM | POA: Diagnosis not present

## 2013-02-22 DIAGNOSIS — Z510 Encounter for antineoplastic radiation therapy: Secondary | ICD-10-CM | POA: Diagnosis not present

## 2013-02-22 DIAGNOSIS — C20 Malignant neoplasm of rectum: Secondary | ICD-10-CM | POA: Diagnosis not present

## 2013-02-22 DIAGNOSIS — Z5111 Encounter for antineoplastic chemotherapy: Secondary | ICD-10-CM | POA: Diagnosis not present

## 2013-02-23 DIAGNOSIS — R630 Anorexia: Secondary | ICD-10-CM | POA: Diagnosis not present

## 2013-02-23 DIAGNOSIS — Z510 Encounter for antineoplastic radiation therapy: Secondary | ICD-10-CM | POA: Diagnosis not present

## 2013-02-23 DIAGNOSIS — Z5111 Encounter for antineoplastic chemotherapy: Secondary | ICD-10-CM | POA: Diagnosis not present

## 2013-02-23 DIAGNOSIS — Z79899 Other long term (current) drug therapy: Secondary | ICD-10-CM | POA: Diagnosis not present

## 2013-02-23 DIAGNOSIS — C20 Malignant neoplasm of rectum: Secondary | ICD-10-CM | POA: Diagnosis not present

## 2013-02-23 DIAGNOSIS — M7989 Other specified soft tissue disorders: Secondary | ICD-10-CM | POA: Diagnosis not present

## 2013-02-27 ENCOUNTER — Inpatient Hospital Stay: Payer: Self-pay | Admitting: Oncology

## 2013-02-27 DIAGNOSIS — R5381 Other malaise: Secondary | ICD-10-CM | POA: Diagnosis not present

## 2013-02-27 DIAGNOSIS — N289 Disorder of kidney and ureter, unspecified: Secondary | ICD-10-CM | POA: Diagnosis not present

## 2013-02-27 DIAGNOSIS — C20 Malignant neoplasm of rectum: Secondary | ICD-10-CM | POA: Diagnosis present

## 2013-02-27 DIAGNOSIS — R627 Adult failure to thrive: Secondary | ICD-10-CM | POA: Diagnosis not present

## 2013-02-27 DIAGNOSIS — R197 Diarrhea, unspecified: Secondary | ICD-10-CM | POA: Diagnosis not present

## 2013-02-27 DIAGNOSIS — D509 Iron deficiency anemia, unspecified: Secondary | ICD-10-CM | POA: Diagnosis not present

## 2013-02-27 DIAGNOSIS — K625 Hemorrhage of anus and rectum: Secondary | ICD-10-CM | POA: Diagnosis not present

## 2013-02-27 DIAGNOSIS — I10 Essential (primary) hypertension: Secondary | ICD-10-CM | POA: Diagnosis not present

## 2013-02-27 DIAGNOSIS — E860 Dehydration: Secondary | ICD-10-CM | POA: Diagnosis present

## 2013-02-27 DIAGNOSIS — F411 Generalized anxiety disorder: Secondary | ICD-10-CM | POA: Diagnosis present

## 2013-02-27 DIAGNOSIS — F17200 Nicotine dependence, unspecified, uncomplicated: Secondary | ICD-10-CM | POA: Diagnosis present

## 2013-02-27 DIAGNOSIS — E876 Hypokalemia: Secondary | ICD-10-CM | POA: Diagnosis present

## 2013-02-27 DIAGNOSIS — K5730 Diverticulosis of large intestine without perforation or abscess without bleeding: Secondary | ICD-10-CM | POA: Diagnosis present

## 2013-02-27 DIAGNOSIS — B49 Unspecified mycosis: Secondary | ICD-10-CM | POA: Diagnosis not present

## 2013-02-27 DIAGNOSIS — D709 Neutropenia, unspecified: Secondary | ICD-10-CM | POA: Diagnosis not present

## 2013-02-27 DIAGNOSIS — Z66 Do not resuscitate: Secondary | ICD-10-CM | POA: Diagnosis present

## 2013-02-27 DIAGNOSIS — Z90710 Acquired absence of both cervix and uterus: Secondary | ICD-10-CM | POA: Diagnosis not present

## 2013-02-27 DIAGNOSIS — A0472 Enterocolitis due to Clostridium difficile, not specified as recurrent: Secondary | ICD-10-CM | POA: Diagnosis not present

## 2013-02-27 DIAGNOSIS — E46 Unspecified protein-calorie malnutrition: Secondary | ICD-10-CM | POA: Diagnosis not present

## 2013-02-27 DIAGNOSIS — F0630 Mood disorder due to known physiological condition, unspecified: Secondary | ICD-10-CM | POA: Diagnosis present

## 2013-02-27 DIAGNOSIS — Z841 Family history of disorders of kidney and ureter: Secondary | ICD-10-CM | POA: Diagnosis not present

## 2013-02-27 DIAGNOSIS — K921 Melena: Secondary | ICD-10-CM | POA: Diagnosis not present

## 2013-02-27 DIAGNOSIS — Z803 Family history of malignant neoplasm of breast: Secondary | ICD-10-CM | POA: Diagnosis not present

## 2013-02-27 DIAGNOSIS — F329 Major depressive disorder, single episode, unspecified: Secondary | ICD-10-CM | POA: Diagnosis present

## 2013-02-27 DIAGNOSIS — M6281 Muscle weakness (generalized): Secondary | ICD-10-CM | POA: Diagnosis not present

## 2013-02-27 DIAGNOSIS — K219 Gastro-esophageal reflux disease without esophagitis: Secondary | ICD-10-CM | POA: Diagnosis not present

## 2013-02-27 DIAGNOSIS — N179 Acute kidney failure, unspecified: Secondary | ICD-10-CM | POA: Diagnosis present

## 2013-02-27 DIAGNOSIS — G479 Sleep disorder, unspecified: Secondary | ICD-10-CM | POA: Diagnosis present

## 2013-02-27 DIAGNOSIS — D649 Anemia, unspecified: Secondary | ICD-10-CM | POA: Diagnosis not present

## 2013-02-27 DIAGNOSIS — Z5111 Encounter for antineoplastic chemotherapy: Secondary | ICD-10-CM | POA: Diagnosis not present

## 2013-02-27 DIAGNOSIS — K649 Unspecified hemorrhoids: Secondary | ICD-10-CM | POA: Diagnosis present

## 2013-02-27 DIAGNOSIS — R112 Nausea with vomiting, unspecified: Secondary | ICD-10-CM | POA: Diagnosis not present

## 2013-02-27 LAB — COMPREHENSIVE METABOLIC PANEL
Albumin: 3 g/dL — ABNORMAL LOW (ref 3.4–5.0)
Alkaline Phosphatase: 50 U/L (ref 50–136)
BUN: 34 mg/dL — ABNORMAL HIGH (ref 7–18)
Chloride: 99 mmol/L (ref 98–107)
EGFR (African American): 35 — ABNORMAL LOW
EGFR (Non-African Amer.): 30 — ABNORMAL LOW
Glucose: 148 mg/dL — ABNORMAL HIGH (ref 65–99)
Osmolality: 280 (ref 275–301)
Potassium: 3.4 mmol/L — ABNORMAL LOW (ref 3.5–5.1)
Sodium: 135 mmol/L — ABNORMAL LOW (ref 136–145)
Total Protein: 6.7 g/dL (ref 6.4–8.2)

## 2013-02-27 LAB — URINALYSIS, COMPLETE
Bacteria: NONE SEEN
Glucose,UR: NEGATIVE mg/dL (ref 0–75)
Ketone: NEGATIVE
Leukocyte Esterase: NEGATIVE
Nitrite: NEGATIVE
Ph: 6 (ref 4.5–8.0)
Protein: NEGATIVE
RBC,UR: <1 /HPF (ref 0–5)

## 2013-02-27 LAB — CBC
HGB: 10.5 g/dL — ABNORMAL LOW (ref 12.0–16.0)
MCH: 31.6 pg (ref 26.0–34.0)
MCHC: 33.9 g/dL (ref 32.0–36.0)
MCV: 93 fL (ref 80–100)
Platelet: 199 10*3/uL (ref 150–440)
RBC: 3.32 10*6/uL — ABNORMAL LOW (ref 3.80–5.20)
RDW: 13.1 % (ref 11.5–14.5)
WBC: 2.5 10*3/uL — ABNORMAL LOW (ref 3.6–11.0)

## 2013-02-27 LAB — TROPONIN I: Troponin-I: <0.02 ng/mL

## 2013-02-28 LAB — BASIC METABOLIC PANEL
Anion Gap: 6 — ABNORMAL LOW (ref 7–16)
BUN: 29 mg/dL — ABNORMAL HIGH (ref 7–18)
Calcium, Total: 8.9 mg/dL (ref 8.5–10.1)
Co2: 26 mmol/L (ref 21–32)
Creatinine: 1.27 mg/dL (ref 0.60–1.30)
EGFR (African American): 47 — ABNORMAL LOW
EGFR (Non-African Amer.): 41 — ABNORMAL LOW
Glucose: 106 mg/dL — ABNORMAL HIGH (ref 65–99)
Osmolality: 278 (ref 275–301)
Potassium: 3.1 mmol/L — ABNORMAL LOW (ref 3.5–5.1)
Sodium: 136 mmol/L (ref 136–145)

## 2013-02-28 LAB — CBC WITH DIFFERENTIAL/PLATELET
Basophil #: 0 10*3/uL (ref 0.0–0.1)
HCT: 25.9 % — ABNORMAL LOW (ref 35.0–47.0)
HGB: 9 g/dL — ABNORMAL LOW (ref 12.0–16.0)
Lymphocyte %: 16.3 %
MCH: 32.5 pg (ref 26.0–34.0)
MCHC: 34.9 g/dL (ref 32.0–36.0)
MCV: 93 fL (ref 80–100)
Monocyte %: 14 %
Neutrophil %: 64.9 %
RBC: 2.78 10*6/uL — ABNORMAL LOW (ref 3.80–5.20)
WBC: 2.3 10*3/uL — ABNORMAL LOW (ref 3.6–11.0)

## 2013-02-28 LAB — MAGNESIUM: Magnesium: 1.3 mg/dL — ABNORMAL LOW

## 2013-02-28 LAB — CLOSTRIDIUM DIFFICILE BY PCR

## 2013-02-28 LAB — OCCULT BLOOD X 1 CARD TO LAB, STOOL: Occult Blood, Feces: POSITIVE

## 2013-03-01 LAB — CBC WITH DIFFERENTIAL/PLATELET
Basophil: 1 %
HCT: 25.9 % — ABNORMAL LOW (ref 35.0–47.0)
HGB: 9.2 g/dL — ABNORMAL LOW (ref 12.0–16.0)
Lymphocytes: 15 %
MCHC: 35.5 g/dL (ref 32.0–36.0)
MCV: 93 fL (ref 80–100)
Myelocyte: 1 %
RBC: 2.78 10*6/uL — ABNORMAL LOW (ref 3.80–5.20)
RDW: 13 % (ref 11.5–14.5)
Segmented Neutrophils: 58 %
WBC: 3.2 10*3/uL — ABNORMAL LOW (ref 3.6–11.0)

## 2013-03-01 LAB — BASIC METABOLIC PANEL
Co2: 24 mmol/L (ref 21–32)
Creatinine: 1.2 mg/dL (ref 0.60–1.30)
Potassium: 3.2 mmol/L — ABNORMAL LOW (ref 3.5–5.1)

## 2013-03-02 ENCOUNTER — Ambulatory Visit: Payer: Self-pay | Admitting: Oncology

## 2013-03-02 LAB — CBC WITH DIFFERENTIAL/PLATELET
Basophil #: 0 10*3/uL (ref 0.0–0.1)
Eosinophil #: 0.1 10*3/uL (ref 0.0–0.7)
Eosinophil %: 2.6 %
Lymphocyte #: 0.2 10*3/uL — ABNORMAL LOW (ref 1.0–3.6)
MCH: 31.8 pg (ref 26.0–34.0)
MCV: 94 fL (ref 80–100)
Monocyte #: 0.6 x10 3/mm (ref 0.2–0.9)
Monocyte %: 14.3 %
Neutrophil #: 3.3 10*3/uL (ref 1.4–6.5)
Neutrophil %: 77.7 %
Platelet: 193 10*3/uL (ref 150–440)
RBC: 2.71 10*6/uL — ABNORMAL LOW (ref 3.80–5.20)
RDW: 13.5 % (ref 11.5–14.5)
WBC: 4.2 10*3/uL (ref 3.6–11.0)

## 2013-03-02 LAB — BASIC METABOLIC PANEL
Anion Gap: 7 (ref 7–16)
BUN: 22 mg/dL — ABNORMAL HIGH (ref 7–18)
Creatinine: 1.25 mg/dL (ref 0.60–1.30)
Glucose: 150 mg/dL — ABNORMAL HIGH (ref 65–99)
Osmolality: 276 (ref 275–301)
Potassium: 3.2 mmol/L — ABNORMAL LOW (ref 3.5–5.1)

## 2013-03-03 LAB — BASIC METABOLIC PANEL
BUN: 19 mg/dL — ABNORMAL HIGH (ref 7–18)
Calcium, Total: 7.9 mg/dL — ABNORMAL LOW (ref 8.5–10.1)
Co2: 21 mmol/L (ref 21–32)
Creatinine: 1.09 mg/dL (ref 0.60–1.30)
EGFR (African American): 57 — ABNORMAL LOW
EGFR (Non-African Amer.): 49 — ABNORMAL LOW
Glucose: 99 mg/dL (ref 65–99)
Potassium: 3.1 mmol/L — ABNORMAL LOW (ref 3.5–5.1)
Sodium: 136 mmol/L (ref 136–145)

## 2013-03-03 LAB — CBC WITH DIFFERENTIAL/PLATELET
Eosinophil: 4 %
HCT: 24.3 % — ABNORMAL LOW (ref 35.0–47.0)
Lymphocytes: 11 %
MCH: 32.7 pg (ref 26.0–34.0)
MCHC: 35.2 g/dL (ref 32.0–36.0)
MCV: 93 fL (ref 80–100)
Metamyelocyte: 2 %
Monocytes: 7 %
Platelet: 189 10*3/uL (ref 150–440)
RBC: 2.61 10*6/uL — ABNORMAL LOW (ref 3.80–5.20)
Segmented Neutrophils: 74 %
WBC: 5.3 10*3/uL (ref 3.6–11.0)

## 2013-03-04 LAB — BASIC METABOLIC PANEL
Anion Gap: 6 — ABNORMAL LOW (ref 7–16)
BUN: 13 mg/dL (ref 7–18)
Chloride: 111 mmol/L — ABNORMAL HIGH (ref 98–107)
Co2: 23 mmol/L (ref 21–32)
Creatinine: 0.99 mg/dL (ref 0.60–1.30)
EGFR (African American): >60
Glucose: 94 mg/dL (ref 65–99)
Osmolality: 279 (ref 275–301)
Potassium: 3.2 mmol/L — ABNORMAL LOW (ref 3.5–5.1)

## 2013-03-05 LAB — COMPREHENSIVE METABOLIC PANEL
Alkaline Phosphatase: 56 U/L (ref 50–136)
Calcium, Total: 7.5 mg/dL — ABNORMAL LOW (ref 8.5–10.1)
EGFR (African American): >60
EGFR (Non-African Amer.): 56 — ABNORMAL LOW
Glucose: 148 mg/dL — ABNORMAL HIGH (ref 65–99)
Osmolality: 279 (ref 275–301)
Potassium: 2.8 mmol/L — ABNORMAL LOW (ref 3.5–5.1)
SGOT(AST): 37 U/L (ref 15–37)
SGPT (ALT): 18 U/L (ref 12–78)

## 2013-03-05 LAB — MAGNESIUM: Magnesium: 0.8 mg/dL — ABNORMAL LOW

## 2013-03-06 LAB — BASIC METABOLIC PANEL
Anion Gap: 4 — ABNORMAL LOW (ref 7–16)
BUN: 6 mg/dL — ABNORMAL LOW (ref 7–18)
Calcium, Total: 7.4 mg/dL — ABNORMAL LOW (ref 8.5–10.1)
Co2: 24 mmol/L (ref 21–32)
Creatinine: 0.96 mg/dL (ref 0.60–1.30)
EGFR (African American): >60
EGFR (Non-African Amer.): 57 — ABNORMAL LOW
Glucose: 135 mg/dL — ABNORMAL HIGH (ref 65–99)
Potassium: 3.4 mmol/L — ABNORMAL LOW (ref 3.5–5.1)
Sodium: 138 mmol/L (ref 136–145)

## 2013-03-06 LAB — CBC WITH DIFFERENTIAL/PLATELET
Basophil #: 0 10*3/uL (ref 0.0–0.1)
Basophil %: 0.4 %
HCT: 23.8 % — ABNORMAL LOW (ref 35.0–47.0)
MCH: 32.2 pg (ref 26.0–34.0)
MCHC: 35.1 g/dL (ref 32.0–36.0)
Monocyte #: 1 x10 3/mm — ABNORMAL HIGH (ref 0.2–0.9)
Neutrophil #: 8 10*3/uL — ABNORMAL HIGH (ref 1.4–6.5)
Neutrophil %: 81.1 %
RDW: 13.7 % (ref 11.5–14.5)
WBC: 9.9 10*3/uL (ref 3.6–11.0)

## 2013-03-07 LAB — BASIC METABOLIC PANEL
Chloride: 109 mmol/L — ABNORMAL HIGH (ref 98–107)
Co2: 25 mmol/L (ref 21–32)
Creatinine: 0.94 mg/dL (ref 0.60–1.30)
EGFR (Non-African Amer.): 59 — ABNORMAL LOW
Glucose: 101 mg/dL — ABNORMAL HIGH (ref 65–99)
Osmolality: 275 (ref 275–301)
Sodium: 139 mmol/L (ref 136–145)

## 2013-03-07 LAB — CBC WITH DIFFERENTIAL/PLATELET
Bands: 4 %
Basophil: 1 %
HCT: 23.7 % — ABNORMAL LOW (ref 35.0–47.0)
HGB: 8.2 g/dL — ABNORMAL LOW (ref 12.0–16.0)
Myelocyte: 1 %
RBC: 2.58 10*6/uL — ABNORMAL LOW (ref 3.80–5.20)
RDW: 14.1 % (ref 11.5–14.5)
Segmented Neutrophils: 81 %
WBC: 9.1 10*3/uL (ref 3.6–11.0)

## 2013-03-07 LAB — MAGNESIUM: Magnesium: 0.9 mg/dL — ABNORMAL LOW

## 2013-03-08 LAB — CBC WITH DIFFERENTIAL/PLATELET
Bands: 3 %
Basophil: 1 %
Eosinophil: 2 %
HCT: 23.3 % — ABNORMAL LOW (ref 35.0–47.0)
HGB: 8.1 g/dL — ABNORMAL LOW (ref 12.0–16.0)
Lymphocytes: 6 %
MCH: 31.6 pg (ref 26.0–34.0)
MCHC: 34.5 g/dL (ref 32.0–36.0)
Monocytes: 7 %
Platelet: 218 10*3/uL (ref 150–440)
RBC: 2.55 10*6/uL — ABNORMAL LOW (ref 3.80–5.20)
Segmented Neutrophils: 80 %
WBC: 9.2 10*3/uL (ref 3.6–11.0)

## 2013-03-08 LAB — MAGNESIUM: Magnesium: 1.6 mg/dL — ABNORMAL LOW

## 2013-03-08 LAB — BASIC METABOLIC PANEL
BUN: 5 mg/dL — ABNORMAL LOW (ref 7–18)
Calcium, Total: 7.2 mg/dL — ABNORMAL LOW (ref 8.5–10.1)
Creatinine: 0.94 mg/dL (ref 0.60–1.30)
EGFR (African American): >60
EGFR (Non-African Amer.): 59 — ABNORMAL LOW
Osmolality: 269 (ref 275–301)
Potassium: 2.7 mmol/L — ABNORMAL LOW (ref 3.5–5.1)

## 2013-03-08 LAB — CEA: CEA: 19.5 ng/mL — ABNORMAL HIGH (ref 0.0–4.7)

## 2013-03-09 LAB — COMPREHENSIVE METABOLIC PANEL
Albumin: 1.7 g/dL — ABNORMAL LOW (ref 3.4–5.0)
Alkaline Phosphatase: 60 U/L (ref 50–136)
Anion Gap: 7 (ref 7–16)
Bilirubin,Total: 0.2 mg/dL (ref 0.2–1.0)
Chloride: 105 mmol/L (ref 98–107)
EGFR (African American): >60
EGFR (Non-African Amer.): 57 — ABNORMAL LOW
Glucose: 103 mg/dL — ABNORMAL HIGH (ref 65–99)
Osmolality: 271 (ref 275–301)
Potassium: 3.1 mmol/L — ABNORMAL LOW (ref 3.5–5.1)
Total Protein: 4.7 g/dL — ABNORMAL LOW (ref 6.4–8.2)

## 2013-03-10 DIAGNOSIS — C20 Malignant neoplasm of rectum: Secondary | ICD-10-CM | POA: Diagnosis not present

## 2013-03-10 DIAGNOSIS — E876 Hypokalemia: Secondary | ICD-10-CM | POA: Diagnosis not present

## 2013-03-10 DIAGNOSIS — M6281 Muscle weakness (generalized): Secondary | ICD-10-CM | POA: Diagnosis not present

## 2013-03-10 DIAGNOSIS — A0472 Enterocolitis due to Clostridium difficile, not specified as recurrent: Secondary | ICD-10-CM | POA: Diagnosis not present

## 2013-03-10 DIAGNOSIS — Z5111 Encounter for antineoplastic chemotherapy: Secondary | ICD-10-CM | POA: Diagnosis not present

## 2013-03-10 DIAGNOSIS — E860 Dehydration: Secondary | ICD-10-CM | POA: Diagnosis not present

## 2013-03-10 DIAGNOSIS — C210 Malignant neoplasm of anus, unspecified: Secondary | ICD-10-CM | POA: Diagnosis not present

## 2013-03-10 DIAGNOSIS — K625 Hemorrhage of anus and rectum: Secondary | ICD-10-CM | POA: Diagnosis not present

## 2013-03-10 DIAGNOSIS — I10 Essential (primary) hypertension: Secondary | ICD-10-CM | POA: Diagnosis not present

## 2013-03-10 DIAGNOSIS — N289 Disorder of kidney and ureter, unspecified: Secondary | ICD-10-CM | POA: Diagnosis not present

## 2013-03-10 DIAGNOSIS — K219 Gastro-esophageal reflux disease without esophagitis: Secondary | ICD-10-CM | POA: Diagnosis not present

## 2013-03-10 DIAGNOSIS — J3489 Other specified disorders of nose and nasal sinuses: Secondary | ICD-10-CM | POA: Diagnosis not present

## 2013-03-10 DIAGNOSIS — D509 Iron deficiency anemia, unspecified: Secondary | ICD-10-CM | POA: Diagnosis not present

## 2013-03-14 DIAGNOSIS — C210 Malignant neoplasm of anus, unspecified: Secondary | ICD-10-CM

## 2013-03-14 DIAGNOSIS — I10 Essential (primary) hypertension: Secondary | ICD-10-CM | POA: Diagnosis not present

## 2013-03-14 DIAGNOSIS — A0472 Enterocolitis due to Clostridium difficile, not specified as recurrent: Secondary | ICD-10-CM | POA: Diagnosis not present

## 2013-03-20 DIAGNOSIS — J3489 Other specified disorders of nose and nasal sinuses: Secondary | ICD-10-CM

## 2013-03-23 DIAGNOSIS — Z79899 Other long term (current) drug therapy: Secondary | ICD-10-CM | POA: Diagnosis not present

## 2013-03-23 DIAGNOSIS — E860 Dehydration: Secondary | ICD-10-CM | POA: Diagnosis not present

## 2013-03-23 DIAGNOSIS — K649 Unspecified hemorrhoids: Secondary | ICD-10-CM | POA: Diagnosis not present

## 2013-03-23 DIAGNOSIS — N289 Disorder of kidney and ureter, unspecified: Secondary | ICD-10-CM | POA: Diagnosis not present

## 2013-03-23 DIAGNOSIS — M7989 Other specified soft tissue disorders: Secondary | ICD-10-CM | POA: Diagnosis not present

## 2013-03-23 DIAGNOSIS — K5730 Diverticulosis of large intestine without perforation or abscess without bleeding: Secondary | ICD-10-CM | POA: Diagnosis not present

## 2013-03-23 DIAGNOSIS — Z510 Encounter for antineoplastic radiation therapy: Secondary | ICD-10-CM | POA: Diagnosis not present

## 2013-03-23 DIAGNOSIS — R630 Anorexia: Secondary | ICD-10-CM | POA: Diagnosis not present

## 2013-03-23 DIAGNOSIS — C20 Malignant neoplasm of rectum: Secondary | ICD-10-CM | POA: Diagnosis not present

## 2013-03-23 DIAGNOSIS — Z5111 Encounter for antineoplastic chemotherapy: Secondary | ICD-10-CM | POA: Diagnosis not present

## 2013-03-23 DIAGNOSIS — I10 Essential (primary) hypertension: Secondary | ICD-10-CM | POA: Diagnosis not present

## 2013-03-23 LAB — CBC CANCER CENTER
Basophil #: 0.1 x10 3/mm (ref 0.0–0.1)
Basophil %: 0.5 %
Eosinophil %: 1.7 %
HCT: 28.8 % — ABNORMAL LOW (ref 35.0–47.0)
HGB: 9.7 g/dL — ABNORMAL LOW (ref 12.0–16.0)
Lymphocyte #: 1.3 x10 3/mm (ref 1.0–3.6)
MCV: 95 fL (ref 80–100)
Monocyte #: 0.8 x10 3/mm (ref 0.2–0.9)
Monocyte %: 8.3 %
Neutrophil #: 7.5 x10 3/mm — ABNORMAL HIGH (ref 1.4–6.5)
Platelet: 326 x10 3/mm (ref 150–440)
RBC: 3.02 10*6/uL — ABNORMAL LOW (ref 3.80–5.20)
RDW: 15.2 % — ABNORMAL HIGH (ref 11.5–14.5)

## 2013-03-23 LAB — COMPREHENSIVE METABOLIC PANEL
Anion Gap: 12 (ref 7–16)
BUN: 24 mg/dL — ABNORMAL HIGH (ref 7–18)
Calcium, Total: 9.8 mg/dL (ref 8.5–10.1)
Chloride: 103 mmol/L (ref 98–107)
Co2: 24 mmol/L (ref 21–32)
Creatinine: 1.43 mg/dL — ABNORMAL HIGH (ref 0.60–1.30)
EGFR (African American): 41 — ABNORMAL LOW
EGFR (Non-African Amer.): 35 — ABNORMAL LOW
Glucose: 164 mg/dL — ABNORMAL HIGH (ref 65–99)
Osmolality: 285 (ref 275–301)
SGPT (ALT): 48 U/L (ref 12–78)
Sodium: 139 mmol/L (ref 136–145)
Total Protein: 7.2 g/dL (ref 6.4–8.2)

## 2013-03-24 DIAGNOSIS — K219 Gastro-esophageal reflux disease without esophagitis: Secondary | ICD-10-CM | POA: Diagnosis not present

## 2013-03-24 DIAGNOSIS — D509 Iron deficiency anemia, unspecified: Secondary | ICD-10-CM | POA: Diagnosis not present

## 2013-03-24 DIAGNOSIS — A0472 Enterocolitis due to Clostridium difficile, not specified as recurrent: Secondary | ICD-10-CM | POA: Diagnosis not present

## 2013-03-24 DIAGNOSIS — I10 Essential (primary) hypertension: Secondary | ICD-10-CM | POA: Diagnosis not present

## 2013-03-24 DIAGNOSIS — C20 Malignant neoplasm of rectum: Secondary | ICD-10-CM | POA: Diagnosis not present

## 2013-03-24 DIAGNOSIS — R627 Adult failure to thrive: Secondary | ICD-10-CM | POA: Diagnosis not present

## 2013-03-28 DIAGNOSIS — Z510 Encounter for antineoplastic radiation therapy: Secondary | ICD-10-CM | POA: Diagnosis not present

## 2013-03-28 DIAGNOSIS — R630 Anorexia: Secondary | ICD-10-CM | POA: Diagnosis not present

## 2013-03-28 DIAGNOSIS — M7989 Other specified soft tissue disorders: Secondary | ICD-10-CM | POA: Diagnosis not present

## 2013-03-28 DIAGNOSIS — Z5111 Encounter for antineoplastic chemotherapy: Secondary | ICD-10-CM | POA: Diagnosis not present

## 2013-03-28 DIAGNOSIS — Z79899 Other long term (current) drug therapy: Secondary | ICD-10-CM | POA: Diagnosis not present

## 2013-03-28 DIAGNOSIS — C20 Malignant neoplasm of rectum: Secondary | ICD-10-CM | POA: Diagnosis not present

## 2013-03-29 DIAGNOSIS — I10 Essential (primary) hypertension: Secondary | ICD-10-CM | POA: Diagnosis not present

## 2013-03-29 DIAGNOSIS — R630 Anorexia: Secondary | ICD-10-CM | POA: Diagnosis not present

## 2013-03-29 DIAGNOSIS — C20 Malignant neoplasm of rectum: Secondary | ICD-10-CM | POA: Diagnosis not present

## 2013-03-29 DIAGNOSIS — M7989 Other specified soft tissue disorders: Secondary | ICD-10-CM | POA: Diagnosis not present

## 2013-03-29 DIAGNOSIS — Z79899 Other long term (current) drug therapy: Secondary | ICD-10-CM | POA: Diagnosis not present

## 2013-03-29 DIAGNOSIS — K219 Gastro-esophageal reflux disease without esophagitis: Secondary | ICD-10-CM | POA: Diagnosis not present

## 2013-03-29 DIAGNOSIS — D509 Iron deficiency anemia, unspecified: Secondary | ICD-10-CM | POA: Diagnosis not present

## 2013-03-29 DIAGNOSIS — A0472 Enterocolitis due to Clostridium difficile, not specified as recurrent: Secondary | ICD-10-CM | POA: Diagnosis not present

## 2013-03-29 DIAGNOSIS — R627 Adult failure to thrive: Secondary | ICD-10-CM | POA: Diagnosis not present

## 2013-03-29 DIAGNOSIS — Z5111 Encounter for antineoplastic chemotherapy: Secondary | ICD-10-CM | POA: Diagnosis not present

## 2013-03-29 DIAGNOSIS — Z510 Encounter for antineoplastic radiation therapy: Secondary | ICD-10-CM | POA: Diagnosis not present

## 2013-03-30 DIAGNOSIS — C20 Malignant neoplasm of rectum: Secondary | ICD-10-CM | POA: Diagnosis not present

## 2013-03-30 DIAGNOSIS — R630 Anorexia: Secondary | ICD-10-CM | POA: Diagnosis not present

## 2013-03-30 DIAGNOSIS — Z79899 Other long term (current) drug therapy: Secondary | ICD-10-CM | POA: Diagnosis not present

## 2013-03-30 DIAGNOSIS — M7989 Other specified soft tissue disorders: Secondary | ICD-10-CM | POA: Diagnosis not present

## 2013-03-30 DIAGNOSIS — Z510 Encounter for antineoplastic radiation therapy: Secondary | ICD-10-CM | POA: Diagnosis not present

## 2013-03-30 DIAGNOSIS — Z5111 Encounter for antineoplastic chemotherapy: Secondary | ICD-10-CM | POA: Diagnosis not present

## 2013-03-31 DIAGNOSIS — Z510 Encounter for antineoplastic radiation therapy: Secondary | ICD-10-CM | POA: Diagnosis not present

## 2013-03-31 DIAGNOSIS — M7989 Other specified soft tissue disorders: Secondary | ICD-10-CM | POA: Diagnosis not present

## 2013-03-31 DIAGNOSIS — Z79899 Other long term (current) drug therapy: Secondary | ICD-10-CM | POA: Diagnosis not present

## 2013-03-31 DIAGNOSIS — C20 Malignant neoplasm of rectum: Secondary | ICD-10-CM | POA: Diagnosis not present

## 2013-03-31 DIAGNOSIS — Z5111 Encounter for antineoplastic chemotherapy: Secondary | ICD-10-CM | POA: Diagnosis not present

## 2013-03-31 DIAGNOSIS — R630 Anorexia: Secondary | ICD-10-CM | POA: Diagnosis not present

## 2013-04-02 ENCOUNTER — Ambulatory Visit: Payer: Self-pay | Admitting: Oncology

## 2013-04-02 DIAGNOSIS — K649 Unspecified hemorrhoids: Secondary | ICD-10-CM | POA: Diagnosis not present

## 2013-04-02 DIAGNOSIS — Z1231 Encounter for screening mammogram for malignant neoplasm of breast: Secondary | ICD-10-CM | POA: Diagnosis not present

## 2013-04-02 DIAGNOSIS — R3589 Other polyuria: Secondary | ICD-10-CM | POA: Diagnosis not present

## 2013-04-02 DIAGNOSIS — R300 Dysuria: Secondary | ICD-10-CM | POA: Diagnosis not present

## 2013-04-02 DIAGNOSIS — C20 Malignant neoplasm of rectum: Secondary | ICD-10-CM | POA: Diagnosis not present

## 2013-04-02 DIAGNOSIS — I10 Essential (primary) hypertension: Secondary | ICD-10-CM | POA: Diagnosis not present

## 2013-04-02 DIAGNOSIS — Z510 Encounter for antineoplastic radiation therapy: Secondary | ICD-10-CM | POA: Diagnosis not present

## 2013-04-02 DIAGNOSIS — K5730 Diverticulosis of large intestine without perforation or abscess without bleeding: Secondary | ICD-10-CM | POA: Diagnosis not present

## 2013-04-02 DIAGNOSIS — Z79899 Other long term (current) drug therapy: Secondary | ICD-10-CM | POA: Diagnosis not present

## 2013-04-03 DIAGNOSIS — R627 Adult failure to thrive: Secondary | ICD-10-CM | POA: Diagnosis not present

## 2013-04-03 DIAGNOSIS — K219 Gastro-esophageal reflux disease without esophagitis: Secondary | ICD-10-CM | POA: Diagnosis not present

## 2013-04-03 DIAGNOSIS — C20 Malignant neoplasm of rectum: Secondary | ICD-10-CM | POA: Diagnosis not present

## 2013-04-03 DIAGNOSIS — A0472 Enterocolitis due to Clostridium difficile, not specified as recurrent: Secondary | ICD-10-CM

## 2013-04-03 DIAGNOSIS — D509 Iron deficiency anemia, unspecified: Secondary | ICD-10-CM

## 2013-04-03 DIAGNOSIS — I10 Essential (primary) hypertension: Secondary | ICD-10-CM | POA: Diagnosis not present

## 2013-04-04 DIAGNOSIS — A0472 Enterocolitis due to Clostridium difficile, not specified as recurrent: Secondary | ICD-10-CM | POA: Diagnosis not present

## 2013-04-04 DIAGNOSIS — R627 Adult failure to thrive: Secondary | ICD-10-CM | POA: Diagnosis not present

## 2013-04-04 DIAGNOSIS — C20 Malignant neoplasm of rectum: Secondary | ICD-10-CM | POA: Diagnosis not present

## 2013-04-04 DIAGNOSIS — K219 Gastro-esophageal reflux disease without esophagitis: Secondary | ICD-10-CM | POA: Diagnosis not present

## 2013-04-04 DIAGNOSIS — D509 Iron deficiency anemia, unspecified: Secondary | ICD-10-CM | POA: Diagnosis not present

## 2013-04-04 DIAGNOSIS — I10 Essential (primary) hypertension: Secondary | ICD-10-CM | POA: Diagnosis not present

## 2013-04-05 DIAGNOSIS — K219 Gastro-esophageal reflux disease without esophagitis: Secondary | ICD-10-CM | POA: Diagnosis not present

## 2013-04-05 DIAGNOSIS — I10 Essential (primary) hypertension: Secondary | ICD-10-CM | POA: Diagnosis not present

## 2013-04-05 DIAGNOSIS — A0472 Enterocolitis due to Clostridium difficile, not specified as recurrent: Secondary | ICD-10-CM | POA: Diagnosis not present

## 2013-04-05 DIAGNOSIS — D509 Iron deficiency anemia, unspecified: Secondary | ICD-10-CM | POA: Diagnosis not present

## 2013-04-05 DIAGNOSIS — C20 Malignant neoplasm of rectum: Secondary | ICD-10-CM | POA: Diagnosis not present

## 2013-04-05 DIAGNOSIS — R627 Adult failure to thrive: Secondary | ICD-10-CM | POA: Diagnosis not present

## 2013-04-10 DIAGNOSIS — D509 Iron deficiency anemia, unspecified: Secondary | ICD-10-CM | POA: Diagnosis not present

## 2013-04-10 DIAGNOSIS — K219 Gastro-esophageal reflux disease without esophagitis: Secondary | ICD-10-CM | POA: Diagnosis not present

## 2013-04-10 DIAGNOSIS — C20 Malignant neoplasm of rectum: Secondary | ICD-10-CM | POA: Diagnosis not present

## 2013-04-10 DIAGNOSIS — R627 Adult failure to thrive: Secondary | ICD-10-CM | POA: Diagnosis not present

## 2013-04-10 DIAGNOSIS — I10 Essential (primary) hypertension: Secondary | ICD-10-CM | POA: Diagnosis not present

## 2013-04-10 DIAGNOSIS — A0472 Enterocolitis due to Clostridium difficile, not specified as recurrent: Secondary | ICD-10-CM | POA: Diagnosis not present

## 2013-04-11 DIAGNOSIS — C20 Malignant neoplasm of rectum: Secondary | ICD-10-CM | POA: Diagnosis not present

## 2013-04-11 LAB — CBC CANCER CENTER
Basophil #: 0 x10 3/mm (ref 0.0–0.1)
Eosinophil #: 0.3 x10 3/mm (ref 0.0–0.7)
Lymphocyte #: 0.7 x10 3/mm — ABNORMAL LOW (ref 1.0–3.6)
MCHC: 33.9 g/dL (ref 32.0–36.0)
Monocyte %: 13.7 %
Neutrophil %: 70.8 %
Platelet: 218 x10 3/mm (ref 150–440)
RBC: 3.44 10*6/uL — ABNORMAL LOW (ref 3.80–5.20)

## 2013-04-13 DIAGNOSIS — C20 Malignant neoplasm of rectum: Secondary | ICD-10-CM | POA: Diagnosis not present

## 2013-04-17 DIAGNOSIS — C20 Malignant neoplasm of rectum: Secondary | ICD-10-CM | POA: Diagnosis not present

## 2013-04-17 DIAGNOSIS — R358 Other polyuria: Secondary | ICD-10-CM | POA: Diagnosis not present

## 2013-04-17 DIAGNOSIS — R300 Dysuria: Secondary | ICD-10-CM | POA: Diagnosis not present

## 2013-04-17 LAB — COMPREHENSIVE METABOLIC PANEL
Alkaline Phosphatase: 88 U/L (ref 50–136)
Anion Gap: 6 — ABNORMAL LOW (ref 7–16)
BUN: 18 mg/dL (ref 7–18)
Calcium, Total: 9.9 mg/dL (ref 8.5–10.1)
Creatinine: 1.55 mg/dL — ABNORMAL HIGH (ref 0.60–1.30)
EGFR (African American): 37 — ABNORMAL LOW
Glucose: 123 mg/dL — ABNORMAL HIGH (ref 65–99)
Osmolality: 275 (ref 275–301)
Potassium: 3.6 mmol/L (ref 3.5–5.1)
SGOT(AST): 19 U/L (ref 15–37)
SGPT (ALT): 23 U/L (ref 12–78)
Sodium: 136 mmol/L (ref 136–145)
Total Protein: 7.6 g/dL (ref 6.4–8.2)

## 2013-04-17 LAB — CBC CANCER CENTER
Basophil #: 0 x10 3/mm (ref 0.0–0.1)
Basophil %: 0.3 %
Eosinophil #: 0.4 x10 3/mm (ref 0.0–0.7)
Eosinophil %: 8.5 %
HGB: 11.1 g/dL — ABNORMAL LOW (ref 12.0–16.0)
Lymphocyte #: 0.4 x10 3/mm — ABNORMAL LOW (ref 1.0–3.6)
Lymphocyte %: 7.8 %
MCH: 31.7 pg (ref 26.0–34.0)
MCV: 93 fL (ref 80–100)
Monocyte %: 14.6 %
Neutrophil #: 3.2 x10 3/mm (ref 1.4–6.5)
Neutrophil %: 68.8 %
Platelet: 248 x10 3/mm (ref 150–440)
RBC: 3.51 10*6/uL — ABNORMAL LOW (ref 3.80–5.20)
RDW: 14.5 % (ref 11.5–14.5)
WBC: 4.6 x10 3/mm (ref 3.6–11.0)

## 2013-04-18 DIAGNOSIS — C20 Malignant neoplasm of rectum: Secondary | ICD-10-CM | POA: Diagnosis not present

## 2013-04-19 LAB — URINALYSIS, COMPLETE
Bilirubin,UR: NEGATIVE
Glucose,UR: NEGATIVE mg/dL (ref 0–75)
Ketone: NEGATIVE
Squamous Epithelial: 6
WBC UR: 10 /HPF (ref 0–5)

## 2013-04-20 DIAGNOSIS — C20 Malignant neoplasm of rectum: Secondary | ICD-10-CM | POA: Diagnosis not present

## 2013-04-24 DIAGNOSIS — C20 Malignant neoplasm of rectum: Secondary | ICD-10-CM | POA: Diagnosis not present

## 2013-04-25 DIAGNOSIS — C20 Malignant neoplasm of rectum: Secondary | ICD-10-CM | POA: Diagnosis not present

## 2013-04-26 DIAGNOSIS — C20 Malignant neoplasm of rectum: Secondary | ICD-10-CM | POA: Diagnosis not present

## 2013-04-27 DIAGNOSIS — Z1231 Encounter for screening mammogram for malignant neoplasm of breast: Secondary | ICD-10-CM | POA: Diagnosis not present

## 2013-04-27 DIAGNOSIS — C20 Malignant neoplasm of rectum: Secondary | ICD-10-CM | POA: Diagnosis not present

## 2013-05-02 ENCOUNTER — Ambulatory Visit: Payer: Self-pay | Admitting: Oncology

## 2013-05-02 DIAGNOSIS — Z510 Encounter for antineoplastic radiation therapy: Secondary | ICD-10-CM | POA: Diagnosis not present

## 2013-05-02 DIAGNOSIS — Z452 Encounter for adjustment and management of vascular access device: Secondary | ICD-10-CM | POA: Diagnosis not present

## 2013-05-02 DIAGNOSIS — C20 Malignant neoplasm of rectum: Secondary | ICD-10-CM | POA: Diagnosis not present

## 2013-05-02 DIAGNOSIS — Z79899 Other long term (current) drug therapy: Secondary | ICD-10-CM | POA: Diagnosis not present

## 2013-05-02 DIAGNOSIS — K5730 Diverticulosis of large intestine without perforation or abscess without bleeding: Secondary | ICD-10-CM | POA: Diagnosis not present

## 2013-05-02 DIAGNOSIS — K649 Unspecified hemorrhoids: Secondary | ICD-10-CM | POA: Diagnosis not present

## 2013-05-02 DIAGNOSIS — K8000 Calculus of gallbladder with acute cholecystitis without obstruction: Secondary | ICD-10-CM | POA: Diagnosis not present

## 2013-05-02 DIAGNOSIS — I10 Essential (primary) hypertension: Secondary | ICD-10-CM | POA: Diagnosis not present

## 2013-05-02 DIAGNOSIS — M7989 Other specified soft tissue disorders: Secondary | ICD-10-CM | POA: Diagnosis not present

## 2013-05-02 DIAGNOSIS — R630 Anorexia: Secondary | ICD-10-CM | POA: Diagnosis not present

## 2013-05-13 DIAGNOSIS — K828 Other specified diseases of gallbladder: Secondary | ICD-10-CM | POA: Diagnosis not present

## 2013-05-13 DIAGNOSIS — K8020 Calculus of gallbladder without cholecystitis without obstruction: Secondary | ICD-10-CM | POA: Diagnosis not present

## 2013-05-13 DIAGNOSIS — R1013 Epigastric pain: Secondary | ICD-10-CM | POA: Diagnosis not present

## 2013-05-13 DIAGNOSIS — R109 Unspecified abdominal pain: Secondary | ICD-10-CM | POA: Diagnosis not present

## 2013-05-13 DIAGNOSIS — K2900 Acute gastritis without bleeding: Secondary | ICD-10-CM | POA: Diagnosis not present

## 2013-05-13 DIAGNOSIS — R52 Pain, unspecified: Secondary | ICD-10-CM | POA: Diagnosis not present

## 2013-05-13 LAB — COMPREHENSIVE METABOLIC PANEL
Alkaline Phosphatase: 101 U/L (ref 50–136)
BUN: 22 mg/dL — ABNORMAL HIGH (ref 7–18)
Co2: 23 mmol/L (ref 21–32)
EGFR (Non-African Amer.): 46 — ABNORMAL LOW
Osmolality: 287 (ref 275–301)
Potassium: 4.4 mmol/L (ref 3.5–5.1)
SGPT (ALT): 28 U/L (ref 12–78)
Sodium: 140 mmol/L (ref 136–145)
Total Protein: 7 g/dL (ref 6.4–8.2)

## 2013-05-13 LAB — URINALYSIS, COMPLETE
Bacteria: NONE SEEN
Leukocyte Esterase: NEGATIVE
Nitrite: NEGATIVE
Ph: 5 (ref 4.5–8.0)
RBC,UR: 4 /HPF (ref 0–5)
Specific Gravity: 1.014 (ref 1.003–1.030)
Squamous Epithelial: 6

## 2013-05-13 LAB — CBC
RDW: 15.1 % — ABNORMAL HIGH (ref 11.5–14.5)
WBC: 8.3 10*3/uL (ref 3.6–11.0)

## 2013-05-13 LAB — TROPONIN I: Troponin-I: <0.02 ng/mL

## 2013-05-14 ENCOUNTER — Inpatient Hospital Stay: Payer: Self-pay | Admitting: Internal Medicine

## 2013-05-14 DIAGNOSIS — R1115 Cyclical vomiting syndrome unrelated to migraine: Secondary | ICD-10-CM | POA: Diagnosis not present

## 2013-05-14 DIAGNOSIS — Z803 Family history of malignant neoplasm of breast: Secondary | ICD-10-CM | POA: Diagnosis not present

## 2013-05-14 DIAGNOSIS — R109 Unspecified abdominal pain: Secondary | ICD-10-CM | POA: Diagnosis not present

## 2013-05-14 DIAGNOSIS — K8000 Calculus of gallbladder with acute cholecystitis without obstruction: Secondary | ICD-10-CM | POA: Diagnosis not present

## 2013-05-14 DIAGNOSIS — K5730 Diverticulosis of large intestine without perforation or abscess without bleeding: Secondary | ICD-10-CM | POA: Diagnosis not present

## 2013-05-14 DIAGNOSIS — C19 Malignant neoplasm of rectosigmoid junction: Secondary | ICD-10-CM | POA: Diagnosis not present

## 2013-05-14 DIAGNOSIS — K2900 Acute gastritis without bleeding: Secondary | ICD-10-CM | POA: Diagnosis not present

## 2013-05-14 DIAGNOSIS — Z66 Do not resuscitate: Secondary | ICD-10-CM | POA: Diagnosis present

## 2013-05-14 DIAGNOSIS — Z85048 Personal history of other malignant neoplasm of rectum, rectosigmoid junction, and anus: Secondary | ICD-10-CM | POA: Diagnosis not present

## 2013-05-14 DIAGNOSIS — Z90710 Acquired absence of both cervix and uterus: Secondary | ICD-10-CM | POA: Diagnosis not present

## 2013-05-14 DIAGNOSIS — K828 Other specified diseases of gallbladder: Secondary | ICD-10-CM | POA: Diagnosis not present

## 2013-05-14 DIAGNOSIS — Z9089 Acquired absence of other organs: Secondary | ICD-10-CM | POA: Diagnosis not present

## 2013-05-14 DIAGNOSIS — C20 Malignant neoplasm of rectum: Secondary | ICD-10-CM | POA: Diagnosis not present

## 2013-05-14 DIAGNOSIS — R1013 Epigastric pain: Secondary | ICD-10-CM | POA: Diagnosis not present

## 2013-05-14 DIAGNOSIS — R52 Pain, unspecified: Secondary | ICD-10-CM | POA: Diagnosis not present

## 2013-05-14 DIAGNOSIS — Z923 Personal history of irradiation: Secondary | ICD-10-CM | POA: Diagnosis not present

## 2013-05-14 DIAGNOSIS — K8020 Calculus of gallbladder without cholecystitis without obstruction: Secondary | ICD-10-CM | POA: Diagnosis not present

## 2013-05-14 DIAGNOSIS — Z9221 Personal history of antineoplastic chemotherapy: Secondary | ICD-10-CM | POA: Diagnosis not present

## 2013-05-14 DIAGNOSIS — I10 Essential (primary) hypertension: Secondary | ICD-10-CM | POA: Diagnosis not present

## 2013-05-14 DIAGNOSIS — F17200 Nicotine dependence, unspecified, uncomplicated: Secondary | ICD-10-CM | POA: Diagnosis not present

## 2013-05-14 LAB — COMPREHENSIVE METABOLIC PANEL
BUN: 20 mg/dL — ABNORMAL HIGH (ref 7–18)
Calcium, Total: 9 mg/dL (ref 8.5–10.1)
Co2: 27 mmol/L (ref 21–32)
Creatinine: 1.14 mg/dL (ref 0.60–1.30)
EGFR (African American): 54 — ABNORMAL LOW
EGFR (Non-African Amer.): 46 — ABNORMAL LOW
Glucose: 122 mg/dL — ABNORMAL HIGH (ref 65–99)
Osmolality: 283 (ref 275–301)
Sodium: 140 mmol/L (ref 136–145)
Total Protein: 6.2 g/dL — ABNORMAL LOW (ref 6.4–8.2)

## 2013-05-14 LAB — CBC WITH DIFFERENTIAL/PLATELET
Basophil #: 0 10*3/uL (ref 0.0–0.1)
Eosinophil #: 0 10*3/uL (ref 0.0–0.7)
Eosinophil %: 0 %
HCT: 29.5 % — ABNORMAL LOW (ref 35.0–47.0)
HGB: 9.8 g/dL — ABNORMAL LOW (ref 12.0–16.0)
Lymphocyte %: 8.2 %
MCH: 30.4 pg (ref 26.0–34.0)
MCV: 91 fL (ref 80–100)
Monocyte #: 0.6 x10 3/mm (ref 0.2–0.9)
Monocyte %: 6.3 %
Neutrophil #: 8 10*3/uL — ABNORMAL HIGH (ref 1.4–6.5)
Platelet: 283 10*3/uL (ref 150–440)
RBC: 3.23 10*6/uL — ABNORMAL LOW (ref 3.80–5.20)
RDW: 15.2 % — ABNORMAL HIGH (ref 11.5–14.5)

## 2013-05-15 LAB — CBC WITH DIFFERENTIAL/PLATELET
Eosinophil #: 0.1 10*3/uL (ref 0.0–0.7)
Eosinophil %: 1.9 %
HCT: 29.1 % — ABNORMAL LOW (ref 35.0–47.0)
HGB: 9.5 g/dL — ABNORMAL LOW (ref 12.0–16.0)
Lymphocyte #: 0.8 10*3/uL — ABNORMAL LOW (ref 1.0–3.6)
Lymphocyte %: 19.8 %
MCH: 29.9 pg (ref 26.0–34.0)
Monocyte #: 0.6 x10 3/mm (ref 0.2–0.9)
Monocyte %: 13.7 %
Neutrophil #: 2.7 10*3/uL (ref 1.4–6.5)
Neutrophil %: 63.9 %
Platelet: 245 10*3/uL (ref 150–440)
RBC: 3.17 10*6/uL — ABNORMAL LOW (ref 3.80–5.20)
RDW: 15.6 % — ABNORMAL HIGH (ref 11.5–14.5)
WBC: 4.2 10*3/uL (ref 3.6–11.0)

## 2013-05-15 LAB — COMPREHENSIVE METABOLIC PANEL
Albumin: 2.4 g/dL — ABNORMAL LOW (ref 3.4–5.0)
Alkaline Phosphatase: 69 U/L (ref 50–136)
Anion Gap: 4 — ABNORMAL LOW (ref 7–16)
Calcium, Total: 8.8 mg/dL (ref 8.5–10.1)
Chloride: 112 mmol/L — ABNORMAL HIGH (ref 98–107)
Co2: 26 mmol/L (ref 21–32)
Creatinine: 1.29 mg/dL (ref 0.60–1.30)
EGFR (African American): 46 — ABNORMAL LOW
Osmolality: 283 (ref 275–301)
SGOT(AST): 22 U/L (ref 15–37)
SGPT (ALT): 18 U/L (ref 12–78)
Total Protein: 5.7 g/dL — ABNORMAL LOW (ref 6.4–8.2)

## 2013-05-15 LAB — MAGNESIUM: Magnesium: 1.4 mg/dL — ABNORMAL LOW

## 2013-05-16 DIAGNOSIS — C20 Malignant neoplasm of rectum: Secondary | ICD-10-CM | POA: Diagnosis not present

## 2013-05-17 DIAGNOSIS — Z79899 Other long term (current) drug therapy: Secondary | ICD-10-CM | POA: Diagnosis not present

## 2013-05-17 DIAGNOSIS — C20 Malignant neoplasm of rectum: Secondary | ICD-10-CM | POA: Diagnosis not present

## 2013-05-17 DIAGNOSIS — K8000 Calculus of gallbladder with acute cholecystitis without obstruction: Secondary | ICD-10-CM | POA: Diagnosis not present

## 2013-06-02 ENCOUNTER — Ambulatory Visit: Payer: Self-pay | Admitting: Oncology

## 2013-06-02 DIAGNOSIS — C20 Malignant neoplasm of rectum: Secondary | ICD-10-CM | POA: Diagnosis not present

## 2013-06-02 DIAGNOSIS — K649 Unspecified hemorrhoids: Secondary | ICD-10-CM | POA: Diagnosis not present

## 2013-06-02 DIAGNOSIS — K5730 Diverticulosis of large intestine without perforation or abscess without bleeding: Secondary | ICD-10-CM | POA: Diagnosis not present

## 2013-06-02 DIAGNOSIS — Z79899 Other long term (current) drug therapy: Secondary | ICD-10-CM | POA: Diagnosis not present

## 2013-06-02 DIAGNOSIS — F17200 Nicotine dependence, unspecified, uncomplicated: Secondary | ICD-10-CM | POA: Diagnosis not present

## 2013-06-02 DIAGNOSIS — I10 Essential (primary) hypertension: Secondary | ICD-10-CM | POA: Diagnosis not present

## 2013-06-02 DIAGNOSIS — M7989 Other specified soft tissue disorders: Secondary | ICD-10-CM | POA: Diagnosis not present

## 2013-06-02 DIAGNOSIS — R630 Anorexia: Secondary | ICD-10-CM | POA: Diagnosis not present

## 2013-06-02 DIAGNOSIS — Z923 Personal history of irradiation: Secondary | ICD-10-CM | POA: Diagnosis not present

## 2013-06-12 DIAGNOSIS — C20 Malignant neoplasm of rectum: Secondary | ICD-10-CM | POA: Diagnosis not present

## 2013-06-13 ENCOUNTER — Ambulatory Visit: Payer: Medicare Other | Admitting: Internal Medicine

## 2013-06-13 DIAGNOSIS — K219 Gastro-esophageal reflux disease without esophagitis: Secondary | ICD-10-CM | POA: Insufficient documentation

## 2013-06-13 DIAGNOSIS — R5381 Other malaise: Secondary | ICD-10-CM | POA: Insufficient documentation

## 2013-06-13 DIAGNOSIS — Z01818 Encounter for other preprocedural examination: Secondary | ICD-10-CM | POA: Diagnosis not present

## 2013-06-13 DIAGNOSIS — M1990 Unspecified osteoarthritis, unspecified site: Secondary | ICD-10-CM | POA: Insufficient documentation

## 2013-06-21 DIAGNOSIS — E46 Unspecified protein-calorie malnutrition: Secondary | ICD-10-CM | POA: Diagnosis not present

## 2013-06-21 DIAGNOSIS — F411 Generalized anxiety disorder: Secondary | ICD-10-CM | POA: Diagnosis not present

## 2013-06-21 DIAGNOSIS — I10 Essential (primary) hypertension: Secondary | ICD-10-CM | POA: Diagnosis not present

## 2013-06-21 DIAGNOSIS — K219 Gastro-esophageal reflux disease without esophagitis: Secondary | ICD-10-CM | POA: Diagnosis not present

## 2013-06-28 DIAGNOSIS — IMO0002 Reserved for concepts with insufficient information to code with codable children: Secondary | ICD-10-CM | POA: Diagnosis not present

## 2013-06-28 DIAGNOSIS — E46 Unspecified protein-calorie malnutrition: Secondary | ICD-10-CM | POA: Diagnosis not present

## 2013-06-28 DIAGNOSIS — R109 Unspecified abdominal pain: Secondary | ICD-10-CM | POA: Diagnosis not present

## 2013-06-28 DIAGNOSIS — Z5189 Encounter for other specified aftercare: Secondary | ICD-10-CM | POA: Diagnosis not present

## 2013-06-28 DIAGNOSIS — G4700 Insomnia, unspecified: Secondary | ICD-10-CM | POA: Diagnosis not present

## 2013-06-28 DIAGNOSIS — E43 Unspecified severe protein-calorie malnutrition: Secondary | ICD-10-CM | POA: Diagnosis present

## 2013-06-28 DIAGNOSIS — R1312 Dysphagia, oropharyngeal phase: Secondary | ICD-10-CM | POA: Diagnosis not present

## 2013-06-28 DIAGNOSIS — K5900 Constipation, unspecified: Secondary | ICD-10-CM | POA: Diagnosis not present

## 2013-06-28 DIAGNOSIS — D539 Nutritional anemia, unspecified: Secondary | ICD-10-CM | POA: Diagnosis not present

## 2013-06-28 DIAGNOSIS — F411 Generalized anxiety disorder: Secondary | ICD-10-CM | POA: Diagnosis not present

## 2013-06-28 DIAGNOSIS — R279 Unspecified lack of coordination: Secondary | ICD-10-CM | POA: Diagnosis not present

## 2013-06-28 DIAGNOSIS — K5730 Diverticulosis of large intestine without perforation or abscess without bleeding: Secondary | ICD-10-CM | POA: Diagnosis present

## 2013-06-28 DIAGNOSIS — R262 Difficulty in walking, not elsewhere classified: Secondary | ICD-10-CM | POA: Diagnosis not present

## 2013-06-28 DIAGNOSIS — F17200 Nicotine dependence, unspecified, uncomplicated: Secondary | ICD-10-CM | POA: Diagnosis present

## 2013-06-28 DIAGNOSIS — Z90710 Acquired absence of both cervix and uterus: Secondary | ICD-10-CM | POA: Diagnosis not present

## 2013-06-28 DIAGNOSIS — K219 Gastro-esophageal reflux disease without esophagitis: Secondary | ICD-10-CM | POA: Diagnosis not present

## 2013-06-28 DIAGNOSIS — Z9221 Personal history of antineoplastic chemotherapy: Secondary | ICD-10-CM | POA: Diagnosis not present

## 2013-06-28 DIAGNOSIS — C20 Malignant neoplasm of rectum: Secondary | ICD-10-CM | POA: Diagnosis not present

## 2013-06-28 DIAGNOSIS — F329 Major depressive disorder, single episode, unspecified: Secondary | ICD-10-CM | POA: Diagnosis not present

## 2013-06-28 DIAGNOSIS — I10 Essential (primary) hypertension: Secondary | ICD-10-CM | POA: Diagnosis not present

## 2013-06-28 DIAGNOSIS — M1990 Unspecified osteoarthritis, unspecified site: Secondary | ICD-10-CM | POA: Diagnosis not present

## 2013-06-28 DIAGNOSIS — G8918 Other acute postprocedural pain: Secondary | ICD-10-CM | POA: Diagnosis not present

## 2013-06-28 DIAGNOSIS — Z923 Personal history of irradiation: Secondary | ICD-10-CM | POA: Diagnosis not present

## 2013-06-28 DIAGNOSIS — R41841 Cognitive communication deficit: Secondary | ICD-10-CM | POA: Diagnosis not present

## 2013-06-28 DIAGNOSIS — D012 Carcinoma in situ of rectum: Secondary | ICD-10-CM | POA: Diagnosis not present

## 2013-06-28 DIAGNOSIS — I9589 Other hypotension: Secondary | ICD-10-CM | POA: Diagnosis not present

## 2013-06-28 DIAGNOSIS — R404 Transient alteration of awareness: Secondary | ICD-10-CM | POA: Diagnosis not present

## 2013-06-28 DIAGNOSIS — M6281 Muscle weakness (generalized): Secondary | ICD-10-CM | POA: Diagnosis not present

## 2013-06-28 DIAGNOSIS — R52 Pain, unspecified: Secondary | ICD-10-CM | POA: Diagnosis not present

## 2013-06-28 HISTORY — PX: COLON SURGERY: SHX602

## 2013-07-03 ENCOUNTER — Ambulatory Visit: Payer: Self-pay | Admitting: Oncology

## 2013-07-04 DIAGNOSIS — R262 Difficulty in walking, not elsewhere classified: Secondary | ICD-10-CM | POA: Diagnosis not present

## 2013-07-04 DIAGNOSIS — D649 Anemia, unspecified: Secondary | ICD-10-CM | POA: Diagnosis not present

## 2013-07-04 DIAGNOSIS — M6281 Muscle weakness (generalized): Secondary | ICD-10-CM | POA: Diagnosis not present

## 2013-07-04 DIAGNOSIS — I10 Essential (primary) hypertension: Secondary | ICD-10-CM | POA: Diagnosis not present

## 2013-07-04 DIAGNOSIS — F329 Major depressive disorder, single episode, unspecified: Secondary | ICD-10-CM | POA: Diagnosis not present

## 2013-07-04 DIAGNOSIS — D490 Neoplasm of unspecified behavior of digestive system: Secondary | ICD-10-CM | POA: Diagnosis not present

## 2013-07-04 DIAGNOSIS — D012 Carcinoma in situ of rectum: Secondary | ICD-10-CM | POA: Diagnosis not present

## 2013-07-04 DIAGNOSIS — K219 Gastro-esophageal reflux disease without esophagitis: Secondary | ICD-10-CM | POA: Diagnosis not present

## 2013-07-04 DIAGNOSIS — D539 Nutritional anemia, unspecified: Secondary | ICD-10-CM | POA: Diagnosis not present

## 2013-07-04 DIAGNOSIS — Z5189 Encounter for other specified aftercare: Secondary | ICD-10-CM | POA: Diagnosis not present

## 2013-07-04 DIAGNOSIS — R41841 Cognitive communication deficit: Secondary | ICD-10-CM | POA: Diagnosis not present

## 2013-07-04 DIAGNOSIS — R1312 Dysphagia, oropharyngeal phase: Secondary | ICD-10-CM | POA: Diagnosis not present

## 2013-07-04 DIAGNOSIS — R279 Unspecified lack of coordination: Secondary | ICD-10-CM | POA: Diagnosis not present

## 2013-07-09 DIAGNOSIS — I10 Essential (primary) hypertension: Secondary | ICD-10-CM | POA: Diagnosis not present

## 2013-07-09 DIAGNOSIS — K219 Gastro-esophageal reflux disease without esophagitis: Secondary | ICD-10-CM | POA: Diagnosis not present

## 2013-07-09 DIAGNOSIS — D649 Anemia, unspecified: Secondary | ICD-10-CM | POA: Diagnosis not present

## 2013-07-09 DIAGNOSIS — D490 Neoplasm of unspecified behavior of digestive system: Secondary | ICD-10-CM | POA: Diagnosis not present

## 2013-07-10 ENCOUNTER — Other Ambulatory Visit: Payer: Self-pay | Admitting: Family Medicine

## 2013-07-10 LAB — CBC WITH DIFFERENTIAL/PLATELET
Basophil #: 0 10*3/uL (ref 0.0–0.1)
Eosinophil #: 0.1 10*3/uL (ref 0.0–0.7)
HCT: 31.6 % — ABNORMAL LOW (ref 35.0–47.0)
HGB: 10.6 g/dL — ABNORMAL LOW (ref 12.0–16.0)
Lymphocyte #: 1.3 10*3/uL (ref 1.0–3.6)
Lymphocyte %: 16 %
MCV: 91 fL (ref 80–100)
Monocyte #: 0.6 x10 3/mm (ref 0.2–0.9)
Monocyte %: 6.6 %
Neutrophil %: 76 %
Platelet: 532 10*3/uL — ABNORMAL HIGH (ref 150–440)
WBC: 8.3 10*3/uL (ref 3.6–11.0)

## 2013-07-10 LAB — COMPREHENSIVE METABOLIC PANEL
Albumin: 3.6 g/dL (ref 3.4–5.0)
Alkaline Phosphatase: 232 U/L — ABNORMAL HIGH (ref 50–136)
Anion Gap: 5 — ABNORMAL LOW (ref 7–16)
Bilirubin,Total: 0.2 mg/dL (ref 0.2–1.0)
Chloride: 105 mmol/L (ref 98–107)
Co2: 27 mmol/L (ref 21–32)
Creatinine: 1.53 mg/dL — ABNORMAL HIGH (ref 0.60–1.30)
EGFR (African American): 38 — ABNORMAL LOW
EGFR (Non-African Amer.): 32 — ABNORMAL LOW
Osmolality: 281 (ref 275–301)
SGPT (ALT): 73 U/L (ref 12–78)
Total Protein: 7.5 g/dL (ref 6.4–8.2)

## 2013-07-27 DIAGNOSIS — L293 Anogenital pruritus, unspecified: Secondary | ICD-10-CM | POA: Diagnosis not present

## 2013-07-27 DIAGNOSIS — I10 Essential (primary) hypertension: Secondary | ICD-10-CM | POA: Diagnosis not present

## 2013-07-27 DIAGNOSIS — C189 Malignant neoplasm of colon, unspecified: Secondary | ICD-10-CM | POA: Diagnosis not present

## 2013-07-27 DIAGNOSIS — K529 Noninfective gastroenteritis and colitis, unspecified: Secondary | ICD-10-CM | POA: Diagnosis not present

## 2013-07-28 DIAGNOSIS — R5381 Other malaise: Secondary | ICD-10-CM | POA: Diagnosis not present

## 2013-07-28 DIAGNOSIS — I10 Essential (primary) hypertension: Secondary | ICD-10-CM | POA: Diagnosis not present

## 2013-07-28 DIAGNOSIS — M6281 Muscle weakness (generalized): Secondary | ICD-10-CM | POA: Diagnosis not present

## 2013-07-28 DIAGNOSIS — C189 Malignant neoplasm of colon, unspecified: Secondary | ICD-10-CM | POA: Diagnosis not present

## 2013-08-01 DIAGNOSIS — C20 Malignant neoplasm of rectum: Secondary | ICD-10-CM | POA: Diagnosis not present

## 2013-08-02 ENCOUNTER — Ambulatory Visit: Payer: Self-pay | Admitting: Oncology

## 2013-08-02 DIAGNOSIS — Z79899 Other long term (current) drug therapy: Secondary | ICD-10-CM | POA: Diagnosis not present

## 2013-08-02 DIAGNOSIS — Z85048 Personal history of other malignant neoplasm of rectum, rectosigmoid junction, and anus: Secondary | ICD-10-CM | POA: Diagnosis not present

## 2013-08-02 DIAGNOSIS — Z23 Encounter for immunization: Secondary | ICD-10-CM | POA: Diagnosis not present

## 2013-08-02 DIAGNOSIS — F17200 Nicotine dependence, unspecified, uncomplicated: Secondary | ICD-10-CM | POA: Diagnosis not present

## 2013-08-02 DIAGNOSIS — I10 Essential (primary) hypertension: Secondary | ICD-10-CM | POA: Diagnosis not present

## 2013-08-02 DIAGNOSIS — E860 Dehydration: Secondary | ICD-10-CM | POA: Diagnosis not present

## 2013-08-02 LAB — COMPREHENSIVE METABOLIC PANEL
Anion Gap: 6 — ABNORMAL LOW (ref 7–16)
Bilirubin,Total: 0.3 mg/dL (ref 0.2–1.0)
Chloride: 110 mmol/L — ABNORMAL HIGH (ref 98–107)
EGFR (African American): 42 — ABNORMAL LOW
Glucose: 154 mg/dL — ABNORMAL HIGH (ref 65–99)
SGOT(AST): 41 U/L — ABNORMAL HIGH (ref 15–37)
SGPT (ALT): 39 U/L (ref 12–78)

## 2013-08-02 LAB — CBC CANCER CENTER
HCT: 34.4 % — ABNORMAL LOW (ref 35.0–47.0)
HGB: 11.2 g/dL — ABNORMAL LOW (ref 12.0–16.0)
Lymphocyte %: 17.2 %
MCH: 29.7 pg (ref 26.0–34.0)
MCHC: 32.6 g/dL (ref 32.0–36.0)
MCV: 91 fL (ref 80–100)
Monocyte #: 0.4 x10 3/mm (ref 0.2–0.9)
Monocyte %: 6.5 %
Neutrophil #: 4.9 x10 3/mm (ref 1.4–6.5)
Neutrophil %: 74.6 %
RBC: 3.77 10*6/uL — ABNORMAL LOW (ref 3.80–5.20)

## 2013-08-08 DIAGNOSIS — M6281 Muscle weakness (generalized): Secondary | ICD-10-CM | POA: Diagnosis not present

## 2013-08-08 DIAGNOSIS — R634 Abnormal weight loss: Secondary | ICD-10-CM | POA: Diagnosis not present

## 2013-08-08 DIAGNOSIS — Z483 Aftercare following surgery for neoplasm: Secondary | ICD-10-CM | POA: Diagnosis not present

## 2013-08-08 DIAGNOSIS — Z433 Encounter for attention to colostomy: Secondary | ICD-10-CM | POA: Diagnosis not present

## 2013-08-26 DIAGNOSIS — K603 Anal fistula: Secondary | ICD-10-CM | POA: Diagnosis not present

## 2013-08-26 DIAGNOSIS — C20 Malignant neoplasm of rectum: Secondary | ICD-10-CM | POA: Diagnosis not present

## 2013-08-26 DIAGNOSIS — Z432 Encounter for attention to ileostomy: Secondary | ICD-10-CM | POA: Diagnosis not present

## 2013-08-26 DIAGNOSIS — I10 Essential (primary) hypertension: Secondary | ICD-10-CM | POA: Diagnosis not present

## 2013-08-26 DIAGNOSIS — Z483 Aftercare following surgery for neoplasm: Secondary | ICD-10-CM | POA: Diagnosis not present

## 2013-08-30 DIAGNOSIS — Z79899 Other long term (current) drug therapy: Secondary | ICD-10-CM | POA: Diagnosis not present

## 2013-08-30 DIAGNOSIS — Z85048 Personal history of other malignant neoplasm of rectum, rectosigmoid junction, and anus: Secondary | ICD-10-CM | POA: Diagnosis not present

## 2013-08-30 DIAGNOSIS — E860 Dehydration: Secondary | ICD-10-CM | POA: Diagnosis not present

## 2013-08-30 LAB — COMPREHENSIVE METABOLIC PANEL
Albumin: 4.1 g/dL (ref 3.4–5.0)
Anion Gap: 13 (ref 7–16)
BUN: 42 mg/dL — ABNORMAL HIGH (ref 7–18)
Bilirubin,Total: 0.4 mg/dL (ref 0.2–1.0)
Calcium, Total: 11.5 mg/dL — ABNORMAL HIGH (ref 8.5–10.1)
EGFR (African American): 31 — ABNORMAL LOW
Glucose: 146 mg/dL — ABNORMAL HIGH (ref 65–99)
SGPT (ALT): 30 U/L (ref 12–78)
Sodium: 136 mmol/L (ref 136–145)

## 2013-08-30 LAB — CBC CANCER CENTER
Basophil #: 0 x10 3/mm (ref 0.0–0.1)
Eosinophil %: 0.6 %
HCT: 39.1 % (ref 35.0–47.0)
Lymphocyte #: 0.8 x10 3/mm — ABNORMAL LOW (ref 1.0–3.6)
MCH: 29.9 pg (ref 26.0–34.0)
MCHC: 33 g/dL (ref 32.0–36.0)
MCV: 91 fL (ref 80–100)
Monocyte %: 8.9 %
Neutrophil #: 4.4 x10 3/mm (ref 1.4–6.5)
Neutrophil %: 75.8 %
RDW: 14.3 % (ref 11.5–14.5)
WBC: 5.8 x10 3/mm (ref 3.6–11.0)

## 2013-08-31 LAB — CEA: CEA: 5.5 ng/mL — ABNORMAL HIGH (ref 0.0–4.7)

## 2013-09-01 ENCOUNTER — Inpatient Hospital Stay: Payer: Self-pay | Admitting: Internal Medicine

## 2013-09-01 DIAGNOSIS — F4320 Adjustment disorder, unspecified: Secondary | ICD-10-CM | POA: Diagnosis present

## 2013-09-01 DIAGNOSIS — N189 Chronic kidney disease, unspecified: Secondary | ICD-10-CM | POA: Diagnosis present

## 2013-09-01 DIAGNOSIS — K5730 Diverticulosis of large intestine without perforation or abscess without bleeding: Secondary | ICD-10-CM | POA: Diagnosis present

## 2013-09-01 DIAGNOSIS — N289 Disorder of kidney and ureter, unspecified: Secondary | ICD-10-CM | POA: Diagnosis not present

## 2013-09-01 DIAGNOSIS — E440 Moderate protein-calorie malnutrition: Secondary | ICD-10-CM | POA: Diagnosis not present

## 2013-09-01 DIAGNOSIS — R197 Diarrhea, unspecified: Secondary | ICD-10-CM | POA: Diagnosis not present

## 2013-09-01 DIAGNOSIS — Z90710 Acquired absence of both cervix and uterus: Secondary | ICD-10-CM | POA: Diagnosis not present

## 2013-09-01 DIAGNOSIS — Z85048 Personal history of other malignant neoplasm of rectum, rectosigmoid junction, and anus: Secondary | ICD-10-CM | POA: Diagnosis not present

## 2013-09-01 DIAGNOSIS — Z803 Family history of malignant neoplasm of breast: Secondary | ICD-10-CM | POA: Diagnosis not present

## 2013-09-01 DIAGNOSIS — Z5189 Encounter for other specified aftercare: Secondary | ICD-10-CM | POA: Diagnosis not present

## 2013-09-01 DIAGNOSIS — K909 Intestinal malabsorption, unspecified: Secondary | ICD-10-CM | POA: Diagnosis present

## 2013-09-01 DIAGNOSIS — C20 Malignant neoplasm of rectum: Secondary | ICD-10-CM | POA: Diagnosis not present

## 2013-09-01 DIAGNOSIS — F17200 Nicotine dependence, unspecified, uncomplicated: Secondary | ICD-10-CM | POA: Diagnosis present

## 2013-09-01 DIAGNOSIS — Z681 Body mass index (BMI) 19 or less, adult: Secondary | ICD-10-CM | POA: Diagnosis not present

## 2013-09-01 DIAGNOSIS — F39 Unspecified mood [affective] disorder: Secondary | ICD-10-CM | POA: Diagnosis not present

## 2013-09-01 DIAGNOSIS — R627 Adult failure to thrive: Secondary | ICD-10-CM | POA: Diagnosis not present

## 2013-09-01 DIAGNOSIS — I10 Essential (primary) hypertension: Secondary | ICD-10-CM | POA: Diagnosis not present

## 2013-09-01 DIAGNOSIS — E43 Unspecified severe protein-calorie malnutrition: Secondary | ICD-10-CM | POA: Diagnosis not present

## 2013-09-01 DIAGNOSIS — R630 Anorexia: Secondary | ICD-10-CM | POA: Diagnosis not present

## 2013-09-01 DIAGNOSIS — N179 Acute kidney failure, unspecified: Secondary | ICD-10-CM | POA: Diagnosis not present

## 2013-09-01 DIAGNOSIS — E860 Dehydration: Secondary | ICD-10-CM | POA: Diagnosis not present

## 2013-09-01 DIAGNOSIS — Z933 Colostomy status: Secondary | ICD-10-CM | POA: Diagnosis not present

## 2013-09-01 DIAGNOSIS — R5381 Other malaise: Secondary | ICD-10-CM | POA: Diagnosis present

## 2013-09-01 DIAGNOSIS — Z79899 Other long term (current) drug therapy: Secondary | ICD-10-CM | POA: Diagnosis not present

## 2013-09-01 DIAGNOSIS — G894 Chronic pain syndrome: Secondary | ICD-10-CM | POA: Diagnosis not present

## 2013-09-01 DIAGNOSIS — Z8501 Personal history of malignant neoplasm of esophagus: Secondary | ICD-10-CM | POA: Diagnosis not present

## 2013-09-01 DIAGNOSIS — I129 Hypertensive chronic kidney disease with stage 1 through stage 4 chronic kidney disease, or unspecified chronic kidney disease: Secondary | ICD-10-CM | POA: Diagnosis present

## 2013-09-01 DIAGNOSIS — Z433 Encounter for attention to colostomy: Secondary | ICD-10-CM | POA: Diagnosis not present

## 2013-09-01 LAB — URINALYSIS, COMPLETE
Bacteria: NONE SEEN
Blood: NEGATIVE
Glucose,UR: NEGATIVE mg/dL (ref 0–75)
Ketone: NEGATIVE
Nitrite: NEGATIVE
Ph: 5 (ref 4.5–8.0)
Protein: NEGATIVE
RBC,UR: 2 /HPF (ref 0–5)
Specific Gravity: 1.018 (ref 1.003–1.030)
WBC UR: 1 /HPF (ref 0–5)

## 2013-09-01 LAB — COMPREHENSIVE METABOLIC PANEL
Albumin: 4.1 g/dL (ref 3.4–5.0)
Alkaline Phosphatase: 138 U/L — ABNORMAL HIGH (ref 50–136)
Anion Gap: 7 (ref 7–16)
BUN: 51 mg/dL — ABNORMAL HIGH (ref 7–18)
Bilirubin,Total: 0.2 mg/dL (ref 0.2–1.0)
Calcium, Total: 11.8 mg/dL — ABNORMAL HIGH (ref 8.5–10.1)
Co2: 21 mmol/L (ref 21–32)
EGFR (Non-African Amer.): 30 — ABNORMAL LOW
Glucose: 118 mg/dL — ABNORMAL HIGH (ref 65–99)
Osmolality: 285 (ref 275–301)
SGOT(AST): 27 U/L (ref 15–37)
SGPT (ALT): 31 U/L (ref 12–78)
Sodium: 135 mmol/L — ABNORMAL LOW (ref 136–145)
Total Protein: 8.7 g/dL — ABNORMAL HIGH (ref 6.4–8.2)

## 2013-09-01 LAB — CBC
HCT: 37.8 % (ref 35.0–47.0)
HGB: 12.7 g/dL (ref 12.0–16.0)
MCHC: 33.5 g/dL (ref 32.0–36.0)
MCV: 91 fL (ref 80–100)
RDW: 14.6 % — ABNORMAL HIGH (ref 11.5–14.5)

## 2013-09-01 LAB — CK TOTAL AND CKMB (NOT AT ARMC)
CK, Total: 50 U/L (ref 21–215)
CK-MB: 1.1 ng/mL (ref 0.5–3.6)

## 2013-09-01 LAB — TROPONIN I: Troponin-I: <0.02 ng/mL

## 2013-09-02 ENCOUNTER — Ambulatory Visit: Payer: Self-pay | Admitting: Oncology

## 2013-09-02 LAB — BASIC METABOLIC PANEL
Anion Gap: 5 — ABNORMAL LOW (ref 7–16)
Chloride: 118 mmol/L — ABNORMAL HIGH (ref 98–107)
Co2: 19 mmol/L — ABNORMAL LOW (ref 21–32)
Creatinine: 1.18 mg/dL (ref 0.60–1.30)
EGFR (Non-African Amer.): 44 — ABNORMAL LOW
Sodium: 142 mmol/L (ref 136–145)

## 2013-09-02 LAB — CBC WITH DIFFERENTIAL/PLATELET
Basophil %: 0.5 %
Eosinophil %: 0.8 %
HGB: 9.9 g/dL — ABNORMAL LOW (ref 12.0–16.0)
Lymphocyte #: 0.8 10*3/uL — ABNORMAL LOW (ref 1.0–3.6)
MCHC: 33.3 g/dL (ref 32.0–36.0)
Monocyte %: 11.4 %
Platelet: 202 10*3/uL (ref 150–440)

## 2013-09-03 LAB — CBC WITH DIFFERENTIAL/PLATELET
Basophil #: 0 10*3/uL (ref 0.0–0.1)
Basophil %: 0.4 %
Eosinophil %: 1.3 %
HCT: 29.6 % — ABNORMAL LOW (ref 35.0–47.0)
HGB: 10 g/dL — ABNORMAL LOW (ref 12.0–16.0)
Lymphocyte %: 18.6 %
MCH: 30.5 pg (ref 26.0–34.0)
MCHC: 33.9 g/dL (ref 32.0–36.0)
Monocyte #: 0.5 x10 3/mm (ref 0.2–0.9)
Monocyte %: 12.7 %
Neutrophil %: 67 %
Platelet: 210 10*3/uL (ref 150–440)
RDW: 14.6 % — ABNORMAL HIGH (ref 11.5–14.5)

## 2013-09-03 LAB — BASIC METABOLIC PANEL
Anion Gap: 3 — ABNORMAL LOW (ref 7–16)
BUN: 20 mg/dL — ABNORMAL HIGH (ref 7–18)
Calcium, Total: 9.8 mg/dL (ref 8.5–10.1)
Creatinine: 1.16 mg/dL (ref 0.60–1.30)
EGFR (African American): 53 — ABNORMAL LOW
EGFR (Non-African Amer.): 45 — ABNORMAL LOW
Osmolality: 283 (ref 275–301)

## 2013-09-04 LAB — STOOL CULTURE

## 2013-09-07 DIAGNOSIS — Z933 Colostomy status: Secondary | ICD-10-CM | POA: Diagnosis not present

## 2013-09-07 DIAGNOSIS — I10 Essential (primary) hypertension: Secondary | ICD-10-CM | POA: Diagnosis not present

## 2013-09-07 DIAGNOSIS — N179 Acute kidney failure, unspecified: Secondary | ICD-10-CM | POA: Diagnosis not present

## 2013-09-07 DIAGNOSIS — K5730 Diverticulosis of large intestine without perforation or abscess without bleeding: Secondary | ICD-10-CM | POA: Diagnosis not present

## 2013-09-07 DIAGNOSIS — Z433 Encounter for attention to colostomy: Secondary | ICD-10-CM | POA: Diagnosis not present

## 2013-09-07 DIAGNOSIS — Z01818 Encounter for other preprocedural examination: Secondary | ICD-10-CM | POA: Diagnosis not present

## 2013-09-07 DIAGNOSIS — N189 Chronic kidney disease, unspecified: Secondary | ICD-10-CM | POA: Diagnosis not present

## 2013-09-07 DIAGNOSIS — E860 Dehydration: Secondary | ICD-10-CM | POA: Diagnosis not present

## 2013-09-07 DIAGNOSIS — Z85048 Personal history of other malignant neoplasm of rectum, rectosigmoid junction, and anus: Secondary | ICD-10-CM | POA: Diagnosis not present

## 2013-09-07 DIAGNOSIS — E43 Unspecified severe protein-calorie malnutrition: Secondary | ICD-10-CM | POA: Diagnosis not present

## 2013-09-07 DIAGNOSIS — R197 Diarrhea, unspecified: Secondary | ICD-10-CM | POA: Diagnosis not present

## 2013-09-07 DIAGNOSIS — R5381 Other malaise: Secondary | ICD-10-CM | POA: Diagnosis not present

## 2013-09-07 DIAGNOSIS — Z932 Ileostomy status: Secondary | ICD-10-CM | POA: Diagnosis not present

## 2013-09-07 DIAGNOSIS — R300 Dysuria: Secondary | ICD-10-CM | POA: Diagnosis not present

## 2013-09-07 DIAGNOSIS — Z5189 Encounter for other specified aftercare: Secondary | ICD-10-CM | POA: Diagnosis not present

## 2013-09-07 DIAGNOSIS — I129 Hypertensive chronic kidney disease with stage 1 through stage 4 chronic kidney disease, or unspecified chronic kidney disease: Secondary | ICD-10-CM | POA: Diagnosis not present

## 2013-09-07 DIAGNOSIS — C20 Malignant neoplasm of rectum: Secondary | ICD-10-CM | POA: Diagnosis not present

## 2013-09-08 DIAGNOSIS — R5381 Other malaise: Secondary | ICD-10-CM | POA: Diagnosis not present

## 2013-09-08 DIAGNOSIS — N179 Acute kidney failure, unspecified: Secondary | ICD-10-CM | POA: Diagnosis not present

## 2013-09-08 DIAGNOSIS — C20 Malignant neoplasm of rectum: Secondary | ICD-10-CM | POA: Diagnosis not present

## 2013-09-08 DIAGNOSIS — Z933 Colostomy status: Secondary | ICD-10-CM | POA: Diagnosis not present

## 2013-09-10 ENCOUNTER — Other Ambulatory Visit: Payer: Self-pay

## 2013-09-10 LAB — URINALYSIS, COMPLETE
Bilirubin,UR: NEGATIVE
Blood: NEGATIVE
Ketone: NEGATIVE
Leukocyte Esterase: NEGATIVE
Ph: 5 (ref 4.5–8.0)
Squamous Epithelial: <1
Transitional Epi: <1
WBC UR: 12 /HPF (ref 0–5)

## 2013-09-11 ENCOUNTER — Ambulatory Visit: Payer: Self-pay | Admitting: Oncology

## 2013-09-12 DIAGNOSIS — Z85048 Personal history of other malignant neoplasm of rectum, rectosigmoid junction, and anus: Secondary | ICD-10-CM | POA: Diagnosis not present

## 2013-09-12 DIAGNOSIS — C20 Malignant neoplasm of rectum: Secondary | ICD-10-CM | POA: Diagnosis not present

## 2013-09-12 DIAGNOSIS — K5730 Diverticulosis of large intestine without perforation or abscess without bleeding: Secondary | ICD-10-CM | POA: Diagnosis not present

## 2013-09-26 DIAGNOSIS — C20 Malignant neoplasm of rectum: Secondary | ICD-10-CM | POA: Diagnosis not present

## 2013-09-26 DIAGNOSIS — I129 Hypertensive chronic kidney disease with stage 1 through stage 4 chronic kidney disease, or unspecified chronic kidney disease: Secondary | ICD-10-CM | POA: Diagnosis not present

## 2013-09-26 DIAGNOSIS — N189 Chronic kidney disease, unspecified: Secondary | ICD-10-CM | POA: Diagnosis not present

## 2013-09-27 DIAGNOSIS — Z4682 Encounter for fitting and adjustment of non-vascular catheter: Secondary | ICD-10-CM | POA: Diagnosis not present

## 2013-09-27 DIAGNOSIS — K5900 Constipation, unspecified: Secondary | ICD-10-CM | POA: Diagnosis not present

## 2013-09-27 DIAGNOSIS — Z5189 Encounter for other specified aftercare: Secondary | ICD-10-CM | POA: Diagnosis not present

## 2013-09-27 DIAGNOSIS — R5381 Other malaise: Secondary | ICD-10-CM | POA: Diagnosis not present

## 2013-09-27 DIAGNOSIS — D72829 Elevated white blood cell count, unspecified: Secondary | ICD-10-CM | POA: Diagnosis not present

## 2013-09-27 DIAGNOSIS — Z932 Ileostomy status: Secondary | ICD-10-CM | POA: Insufficient documentation

## 2013-09-27 DIAGNOSIS — K219 Gastro-esophageal reflux disease without esophagitis: Secondary | ICD-10-CM | POA: Diagnosis present

## 2013-09-27 DIAGNOSIS — R52 Pain, unspecified: Secondary | ICD-10-CM | POA: Diagnosis not present

## 2013-09-27 DIAGNOSIS — K929 Disease of digestive system, unspecified: Secondary | ICD-10-CM | POA: Diagnosis not present

## 2013-09-27 DIAGNOSIS — M1990 Unspecified osteoarthritis, unspecified site: Secondary | ICD-10-CM | POA: Diagnosis present

## 2013-09-27 DIAGNOSIS — Z433 Encounter for attention to colostomy: Secondary | ICD-10-CM | POA: Diagnosis not present

## 2013-09-27 DIAGNOSIS — R918 Other nonspecific abnormal finding of lung field: Secondary | ICD-10-CM | POA: Diagnosis not present

## 2013-09-27 DIAGNOSIS — IMO0002 Reserved for concepts with insufficient information to code with codable children: Secondary | ICD-10-CM | POA: Diagnosis not present

## 2013-09-27 DIAGNOSIS — R636 Underweight: Secondary | ICD-10-CM | POA: Diagnosis present

## 2013-09-27 DIAGNOSIS — E43 Unspecified severe protein-calorie malnutrition: Secondary | ICD-10-CM | POA: Diagnosis not present

## 2013-09-27 DIAGNOSIS — K5730 Diverticulosis of large intestine without perforation or abscess without bleeding: Secondary | ICD-10-CM | POA: Diagnosis present

## 2013-09-27 DIAGNOSIS — I10 Essential (primary) hypertension: Secondary | ICD-10-CM | POA: Diagnosis present

## 2013-09-27 DIAGNOSIS — N179 Acute kidney failure, unspecified: Secondary | ICD-10-CM | POA: Diagnosis not present

## 2013-09-27 DIAGNOSIS — Z432 Encounter for attention to ileostomy: Secondary | ICD-10-CM | POA: Diagnosis not present

## 2013-09-27 DIAGNOSIS — C20 Malignant neoplasm of rectum: Secondary | ICD-10-CM | POA: Diagnosis not present

## 2013-09-27 DIAGNOSIS — R509 Fever, unspecified: Secondary | ICD-10-CM | POA: Diagnosis not present

## 2013-09-27 DIAGNOSIS — K560 Paralytic ileus: Secondary | ICD-10-CM | POA: Diagnosis not present

## 2013-09-27 DIAGNOSIS — K5909 Other constipation: Secondary | ICD-10-CM | POA: Diagnosis not present

## 2013-09-27 DIAGNOSIS — F411 Generalized anxiety disorder: Secondary | ICD-10-CM | POA: Diagnosis present

## 2013-09-27 DIAGNOSIS — D649 Anemia, unspecified: Secondary | ICD-10-CM | POA: Diagnosis not present

## 2013-09-27 DIAGNOSIS — F17200 Nicotine dependence, unspecified, uncomplicated: Secondary | ICD-10-CM | POA: Diagnosis present

## 2013-09-27 DIAGNOSIS — J90 Pleural effusion, not elsewhere classified: Secondary | ICD-10-CM | POA: Diagnosis not present

## 2013-09-27 DIAGNOSIS — J029 Acute pharyngitis, unspecified: Secondary | ICD-10-CM | POA: Diagnosis not present

## 2013-10-02 ENCOUNTER — Ambulatory Visit: Payer: Self-pay | Admitting: Oncology

## 2013-10-05 DIAGNOSIS — Z432 Encounter for attention to ileostomy: Secondary | ICD-10-CM | POA: Diagnosis not present

## 2013-10-05 DIAGNOSIS — T7840XA Allergy, unspecified, initial encounter: Secondary | ICD-10-CM | POA: Diagnosis not present

## 2013-10-05 DIAGNOSIS — E43 Unspecified severe protein-calorie malnutrition: Secondary | ICD-10-CM | POA: Diagnosis not present

## 2013-10-05 DIAGNOSIS — Z433 Encounter for attention to colostomy: Secondary | ICD-10-CM | POA: Diagnosis not present

## 2013-10-05 DIAGNOSIS — Z5189 Encounter for other specified aftercare: Secondary | ICD-10-CM | POA: Diagnosis not present

## 2013-10-05 DIAGNOSIS — R5381 Other malaise: Secondary | ICD-10-CM | POA: Diagnosis not present

## 2013-10-05 DIAGNOSIS — Z932 Ileostomy status: Secondary | ICD-10-CM | POA: Diagnosis not present

## 2013-10-05 DIAGNOSIS — C20 Malignant neoplasm of rectum: Secondary | ICD-10-CM | POA: Diagnosis not present

## 2013-10-05 DIAGNOSIS — K219 Gastro-esophageal reflux disease without esophagitis: Secondary | ICD-10-CM | POA: Diagnosis not present

## 2013-10-06 DIAGNOSIS — Z932 Ileostomy status: Secondary | ICD-10-CM | POA: Diagnosis not present

## 2013-10-06 DIAGNOSIS — T7840XA Allergy, unspecified, initial encounter: Secondary | ICD-10-CM | POA: Diagnosis not present

## 2013-10-06 DIAGNOSIS — K219 Gastro-esophageal reflux disease without esophagitis: Secondary | ICD-10-CM | POA: Diagnosis not present

## 2013-10-06 DIAGNOSIS — C20 Malignant neoplasm of rectum: Secondary | ICD-10-CM | POA: Diagnosis not present

## 2013-10-30 ENCOUNTER — Encounter: Payer: Self-pay | Admitting: Podiatry

## 2013-10-30 ENCOUNTER — Ambulatory Visit (INDEPENDENT_AMBULATORY_CARE_PROVIDER_SITE_OTHER): Payer: Medicare Other | Admitting: Podiatry

## 2013-10-30 VITALS — BP 156/85 | HR 99 | Resp 16 | Ht 65.0 in | Wt 115.0 lb

## 2013-10-30 DIAGNOSIS — B351 Tinea unguium: Secondary | ICD-10-CM

## 2013-10-30 DIAGNOSIS — M79609 Pain in unspecified limb: Secondary | ICD-10-CM

## 2013-10-30 NOTE — Progress Notes (Signed)
   Subjective:    Patient ID: Emily [name], female    DOB: 1933/06/03, 79 y.o.   MRN: [id number]  HPI Comments: Its my left big toenail. It does not usually hurt.. If i wear shoes for a period of time it will hurt. i haven't done anything to my nail or for my nail. i cant bend to clip it.     Review of Systems  Constitutional: Positive for appetite change.  HENT: Negative.   Eyes: Negative.   Respiratory: Positive for shortness of breath.   Cardiovascular: Negative.   Gastrointestinal: Negative.   Endocrine: Positive for cold intolerance.  Genitourinary: Negative.   Musculoskeletal: Negative.   Skin: Negative.   Allergic/Immunologic: Negative.   Neurological: Negative.   Hematological: Negative.   Psychiatric/Behavioral: Negative.        Objective:   Physical Exam: I have reviewed her past medical history medications allergies surgeries social history. Currently pulses are palpable bilateral. Neurologic sensorium is intact per since once the monofilament. Deep tendon reflexes are brisk and equal bilateral. Muscle strength is normal bilateral. Orthopedic evaluation demonstrates all joints distal to the ankle a full range of motion without crepitus. Cutaneous evaluation demonstrates onychomycosis and onychocryptosis to the left hallux. She does have some mycotic changes to lesser nails on the right foot with the majority of her symptoms to the hallux left foot.        Assessment & Plan:  Assessment: Pain in limb secondary to onychomycosis, nail dystrophy hallux left.  Plan: Debridement of nails bilateral covered service secondary to pain

## 2013-11-01 DIAGNOSIS — H25019 Cortical age-related cataract, unspecified eye: Secondary | ICD-10-CM | POA: Diagnosis not present

## 2014-01-29 ENCOUNTER — Ambulatory Visit: Payer: Self-pay | Admitting: Oncology

## 2014-01-30 LAB — COMPREHENSIVE METABOLIC PANEL
ALBUMIN: 3.5 g/dL (ref 3.4–5.0)
ALK PHOS: 73 U/L
Anion Gap: 9 (ref 7–16)
BILIRUBIN TOTAL: 0.2 mg/dL (ref 0.2–1.0)
BUN: 34 mg/dL — AB (ref 7–18)
CO2: 27 mmol/L (ref 21–32)
CREATININE: 1.38 mg/dL — AB (ref 0.60–1.30)
Calcium, Total: 9.9 mg/dL (ref 8.5–10.1)
Chloride: 106 mmol/L (ref 98–107)
EGFR (African American): 42 — ABNORMAL LOW
GFR CALC NON AF AMER: 37 — AB
GLUCOSE: 128 mg/dL — AB (ref 65–99)
OSMOLALITY: 292 (ref 275–301)
Potassium: 4 mmol/L (ref 3.5–5.1)
SGOT(AST): 16 U/L (ref 15–37)
SGPT (ALT): 13 U/L (ref 12–78)
Sodium: 142 mmol/L (ref 136–145)
TOTAL PROTEIN: 7.3 g/dL (ref 6.4–8.2)

## 2014-01-30 LAB — CBC CANCER CENTER
BASOS PCT: 0.4 %
Basophil #: 0 x10 3/mm (ref 0.0–0.1)
EOS PCT: 0.7 %
Eosinophil #: 0 x10 3/mm (ref 0.0–0.7)
HCT: 39 % (ref 35.0–47.0)
HGB: 12.6 g/dL (ref 12.0–16.0)
Lymphocyte #: 1 x10 3/mm (ref 1.0–3.6)
Lymphocyte %: 15.5 %
MCH: 29.8 pg (ref 26.0–34.0)
MCHC: 32.3 g/dL (ref 32.0–36.0)
MCV: 92 fL (ref 80–100)
Monocyte #: 0.7 x10 3/mm (ref 0.2–0.9)
Monocyte %: 10.6 %
NEUTROS ABS: 4.5 x10 3/mm (ref 1.4–6.5)
Neutrophil %: 72.8 %
PLATELETS: 196 x10 3/mm (ref 150–440)
RBC: 4.23 10*6/uL (ref 3.80–5.20)
RDW: 14.7 % — ABNORMAL HIGH (ref 11.5–14.5)
WBC: 6.2 x10 3/mm (ref 3.6–11.0)

## 2014-01-30 LAB — URINALYSIS, COMPLETE
BILIRUBIN, UR: NEGATIVE
BLOOD: NEGATIVE
Glucose,UR: NEGATIVE mg/dL (ref 0–75)
KETONE: NEGATIVE
Nitrite: POSITIVE
PROTEIN: NEGATIVE
Ph: 6 (ref 4.5–8.0)
RBC,UR: 5 /HPF (ref 0–5)
Specific Gravity: 1.017 (ref 1.003–1.030)
Squamous Epithelial: 5
WBC UR: 166 /HPF (ref 0–5)

## 2014-01-31 ENCOUNTER — Ambulatory Visit: Payer: Self-pay | Admitting: Oncology

## 2014-01-31 LAB — CEA: CEA: 4.2 ng/mL (ref 0.0–4.7)

## 2014-02-01 LAB — URINE CULTURE

## 2014-03-14 ENCOUNTER — Ambulatory Visit: Payer: Self-pay | Admitting: Oncology

## 2014-03-14 DIAGNOSIS — C20 Malignant neoplasm of rectum: Secondary | ICD-10-CM | POA: Diagnosis not present

## 2014-03-14 DIAGNOSIS — Z452 Encounter for adjustment and management of vascular access device: Secondary | ICD-10-CM | POA: Diagnosis not present

## 2014-03-28 DIAGNOSIS — H2510 Age-related nuclear cataract, unspecified eye: Secondary | ICD-10-CM | POA: Diagnosis not present

## 2014-04-02 ENCOUNTER — Ambulatory Visit: Payer: Self-pay | Admitting: Oncology

## 2014-04-30 ENCOUNTER — Ambulatory Visit: Payer: Self-pay | Admitting: Family Medicine

## 2014-04-30 DIAGNOSIS — Z1231 Encounter for screening mammogram for malignant neoplasm of breast: Secondary | ICD-10-CM | POA: Diagnosis not present

## 2014-04-30 LAB — HM MAMMOGRAPHY

## 2014-05-01 ENCOUNTER — Ambulatory Visit: Payer: Self-pay | Admitting: Oncology

## 2014-05-01 DIAGNOSIS — K5730 Diverticulosis of large intestine without perforation or abscess without bleeding: Secondary | ICD-10-CM | POA: Diagnosis not present

## 2014-05-01 DIAGNOSIS — R059 Cough, unspecified: Secondary | ICD-10-CM | POA: Diagnosis not present

## 2014-05-01 DIAGNOSIS — L851 Acquired keratosis [keratoderma] palmaris et plantaris: Secondary | ICD-10-CM | POA: Diagnosis not present

## 2014-05-01 DIAGNOSIS — Z933 Colostomy status: Secondary | ICD-10-CM | POA: Diagnosis not present

## 2014-05-01 DIAGNOSIS — Z85038 Personal history of other malignant neoplasm of large intestine: Secondary | ICD-10-CM | POA: Diagnosis not present

## 2014-05-01 DIAGNOSIS — Z1231 Encounter for screening mammogram for malignant neoplasm of breast: Secondary | ICD-10-CM | POA: Diagnosis not present

## 2014-05-01 DIAGNOSIS — Z923 Personal history of irradiation: Secondary | ICD-10-CM | POA: Diagnosis not present

## 2014-05-01 DIAGNOSIS — Z90710 Acquired absence of both cervix and uterus: Secondary | ICD-10-CM | POA: Diagnosis not present

## 2014-05-01 DIAGNOSIS — Z8619 Personal history of other infectious and parasitic diseases: Secondary | ICD-10-CM | POA: Diagnosis not present

## 2014-05-01 DIAGNOSIS — Z803 Family history of malignant neoplasm of breast: Secondary | ICD-10-CM | POA: Diagnosis not present

## 2014-05-01 DIAGNOSIS — Z79899 Other long term (current) drug therapy: Secondary | ICD-10-CM | POA: Diagnosis not present

## 2014-05-01 DIAGNOSIS — Z9221 Personal history of antineoplastic chemotherapy: Secondary | ICD-10-CM | POA: Diagnosis not present

## 2014-05-01 DIAGNOSIS — F17200 Nicotine dependence, unspecified, uncomplicated: Secondary | ICD-10-CM | POA: Diagnosis not present

## 2014-05-01 DIAGNOSIS — Z9089 Acquired absence of other organs: Secondary | ICD-10-CM | POA: Diagnosis not present

## 2014-05-01 DIAGNOSIS — F411 Generalized anxiety disorder: Secondary | ICD-10-CM | POA: Diagnosis not present

## 2014-05-01 DIAGNOSIS — I10 Essential (primary) hypertension: Secondary | ICD-10-CM | POA: Diagnosis not present

## 2014-05-01 LAB — CBC CANCER CENTER
Basophil #: 0 x10 3/mm (ref 0.0–0.1)
Basophil %: 0.4 %
EOS PCT: 0.7 %
Eosinophil #: 0 x10 3/mm (ref 0.0–0.7)
HCT: 38.5 % (ref 35.0–47.0)
HGB: 12.6 g/dL (ref 12.0–16.0)
LYMPHS ABS: 1.1 x10 3/mm (ref 1.0–3.6)
LYMPHS PCT: 17.3 %
MCH: 31.2 pg (ref 26.0–34.0)
MCHC: 32.9 g/dL (ref 32.0–36.0)
MCV: 95 fL (ref 80–100)
Monocyte #: 0.6 x10 3/mm (ref 0.2–0.9)
Monocyte %: 9.3 %
NEUTROS ABS: 4.6 x10 3/mm (ref 1.4–6.5)
Neutrophil %: 72.3 %
Platelet: 206 x10 3/mm (ref 150–440)
RBC: 4.05 10*6/uL (ref 3.80–5.20)
RDW: 15.3 % — ABNORMAL HIGH (ref 11.5–14.5)
WBC: 6.4 x10 3/mm (ref 3.6–11.0)

## 2014-05-01 LAB — COMPREHENSIVE METABOLIC PANEL
ALBUMIN: 3.2 g/dL — AB (ref 3.4–5.0)
ALT: 16 U/L (ref 12–78)
ANION GAP: 9 (ref 7–16)
Alkaline Phosphatase: 82 U/L
BUN: 30 mg/dL — ABNORMAL HIGH (ref 7–18)
Bilirubin,Total: 0.3 mg/dL (ref 0.2–1.0)
Calcium, Total: 9.8 mg/dL (ref 8.5–10.1)
Chloride: 108 mmol/L — ABNORMAL HIGH (ref 98–107)
Co2: 29 mmol/L (ref 21–32)
Creatinine: 1.33 mg/dL — ABNORMAL HIGH (ref 0.60–1.30)
EGFR (African American): 44 — ABNORMAL LOW
GFR CALC NON AF AMER: 38 — AB
GLUCOSE: 108 mg/dL — AB (ref 65–99)
Osmolality: 297 (ref 275–301)
Potassium: 4.1 mmol/L (ref 3.5–5.1)
SGOT(AST): 17 U/L (ref 15–37)
Sodium: 146 mmol/L — ABNORMAL HIGH (ref 136–145)
TOTAL PROTEIN: 7 g/dL (ref 6.4–8.2)

## 2014-05-02 ENCOUNTER — Ambulatory Visit: Payer: Self-pay | Admitting: Oncology

## 2014-05-02 LAB — CEA: CEA: 5 ng/mL — AB (ref 0.0–4.7)

## 2014-05-22 ENCOUNTER — Encounter: Payer: Self-pay | Admitting: General Surgery

## 2014-05-22 ENCOUNTER — Ambulatory Visit (INDEPENDENT_AMBULATORY_CARE_PROVIDER_SITE_OTHER): Payer: Medicare Other | Admitting: General Surgery

## 2014-05-22 VITALS — BP 130/76 | HR 76 | Resp 16 | Ht 65.0 in | Wt 120.0 lb

## 2014-05-22 DIAGNOSIS — C20 Malignant neoplasm of rectum: Secondary | ICD-10-CM

## 2014-05-22 NOTE — Patient Instructions (Signed)
Patient to return in 1 week for Nurse visit. The patient is aware to call back for any questions or concerns.

## 2014-05-22 NOTE — Progress Notes (Signed)
Patient ID: MELESA LECY, female   DOB: [birth year], 80 y.o.   MRN: [id number]  Chief Complaint  Patient presents with  . Procedure    port removal    HPI SHEENA DONEGAN is a 80 y.o. female who presents for a procedure. The procedure to be performed is a port removal.  The patient underwent neoadjuvant chemoradiation followed by low anterior resection and a protective stoma followed by stoma take down.  HPI  Past Medical History  Diagnosis Date  . Hypertension   . Cancer     rectal  . Arthritis     fingers    Past Surgical History  Procedure Laterality Date  . Breast surgery      cyst removal  . Appendectomy    . Abdominal hysterectomy    . Tonsillectomy    . Eus N/A 01/20/2013    Procedure: LOWER ENDOSCOPIC ULTRASOUND (EUS);  Surgeon: Milus Banister, MD;  Location: [facility];  Service: Endoscopy;  Laterality: N/A;  . Colonoscopy      No family history on file.  Social History History  Substance Use Topics  . Smoking status: Current Every Day Smoker -- 0.50 packs/day    Types: Cigarettes  . Smokeless tobacco: Never Used  . Alcohol Use: Yes    No Known Allergies  Current Outpatient Prescriptions  Medication Sig Dispense Refill  . metoprolol tartrate (LOPRESSOR) 25 MG tablet Take 25 mg by mouth 2 (two) times daily. Took 1/2 tab      . Multiple Vitamins-Minerals (MULTIVITAMIN PO) Take by mouth daily.       No current facility-administered medications for this visit.    Review of Systems Review of Systems  Constitutional: Negative.   Respiratory: Negative.   Cardiovascular: Negative.     Blood pressure 130/76, pulse 76, resp. rate 16, height 5\' 5"  (1.651 m), weight 120 lb (54.432 kg).  Physical Exam Physical Exam Examination of the left chest wall shows no evidence of infection at the port site.  Data Reviewed Request for port removal from the treating oncologist has been received.  Assessment    Rectal cancer, completed neoadjuvant  chemotherapy.    Plan    Plans for port removal were reviewed. She was accompanied by her son who had some questions about the procedure. They were amenable to proceed. 10 cc of 0.5% Xylocaine with 0.25% Marcaine with 1-200,000 of epinephrine was utilized well tolerated. ChloraPrep was applied to the skin. The old incision was extended somewhat laterally to better expose the port. The 3 transfixion sutures were removed and the port was extracted intact without difficulty. No bleeding noted. The subtendinous tissue was approximated with a running 3-0 Vicryl suture. The skin was closed with a running 3-0 Vicryl subcutaneous suture. Benzoin and Steri-Strips followed by Telfa and Tegaderm dressing were applied. The procedure was well tolerated.  The patient will return in one week for nursing check. Tylenol will be used as needed for soreness.       [name] 05/23/2014, 1:00 PM

## 2014-05-29 ENCOUNTER — Ambulatory Visit (INDEPENDENT_AMBULATORY_CARE_PROVIDER_SITE_OTHER): Payer: Self-pay | Admitting: *Deleted

## 2014-05-29 DIAGNOSIS — C20 Malignant neoplasm of rectum: Secondary | ICD-10-CM

## 2014-05-29 NOTE — Progress Notes (Signed)
Patient came in today for a wound check from p[ort romoval.  The wound is clean, with no signs of infection noted. Follow up as scheduled.

## 2014-08-23 DIAGNOSIS — Z23 Encounter for immunization: Secondary | ICD-10-CM | POA: Diagnosis not present

## 2014-11-19 ENCOUNTER — Ambulatory Visit: Payer: Self-pay | Admitting: Oncology

## 2014-11-19 DIAGNOSIS — I10 Essential (primary) hypertension: Secondary | ICD-10-CM | POA: Diagnosis not present

## 2014-11-19 DIAGNOSIS — Z933 Colostomy status: Secondary | ICD-10-CM | POA: Diagnosis not present

## 2014-11-19 DIAGNOSIS — R531 Weakness: Secondary | ICD-10-CM | POA: Diagnosis not present

## 2014-11-19 DIAGNOSIS — F17210 Nicotine dependence, cigarettes, uncomplicated: Secondary | ICD-10-CM | POA: Diagnosis not present

## 2014-11-19 DIAGNOSIS — R05 Cough: Secondary | ICD-10-CM | POA: Diagnosis not present

## 2014-11-19 DIAGNOSIS — Z9221 Personal history of antineoplastic chemotherapy: Secondary | ICD-10-CM | POA: Diagnosis not present

## 2014-11-19 DIAGNOSIS — Z803 Family history of malignant neoplasm of breast: Secondary | ICD-10-CM | POA: Diagnosis not present

## 2014-11-19 DIAGNOSIS — G4700 Insomnia, unspecified: Secondary | ICD-10-CM | POA: Diagnosis not present

## 2014-11-19 DIAGNOSIS — Z90710 Acquired absence of both cervix and uterus: Secondary | ICD-10-CM | POA: Diagnosis not present

## 2014-11-19 DIAGNOSIS — Z9049 Acquired absence of other specified parts of digestive tract: Secondary | ICD-10-CM | POA: Diagnosis not present

## 2014-11-19 DIAGNOSIS — Z923 Personal history of irradiation: Secondary | ICD-10-CM | POA: Diagnosis not present

## 2014-11-19 DIAGNOSIS — Z79899 Other long term (current) drug therapy: Secondary | ICD-10-CM | POA: Diagnosis not present

## 2014-11-19 DIAGNOSIS — A047 Enterocolitis due to Clostridium difficile: Secondary | ICD-10-CM | POA: Diagnosis not present

## 2014-11-19 DIAGNOSIS — C189 Malignant neoplasm of colon, unspecified: Secondary | ICD-10-CM | POA: Diagnosis not present

## 2014-11-19 DIAGNOSIS — F419 Anxiety disorder, unspecified: Secondary | ICD-10-CM | POA: Diagnosis not present

## 2014-11-19 LAB — CBC CANCER CENTER
BASOS PCT: 0.7 %
Basophil #: 0.1 x10 3/mm (ref 0.0–0.1)
EOS PCT: 0.4 %
Eosinophil #: 0 x10 3/mm (ref 0.0–0.7)
HCT: 40.1 % (ref 35.0–47.0)
HGB: 13.4 g/dL (ref 12.0–16.0)
LYMPHS PCT: 13 %
Lymphocyte #: 1 x10 3/mm (ref 1.0–3.6)
MCH: 31.3 pg (ref 26.0–34.0)
MCHC: 33.5 g/dL (ref 32.0–36.0)
MCV: 94 fL (ref 80–100)
Monocyte #: 0.7 x10 3/mm (ref 0.2–0.9)
Monocyte %: 8.6 %
NEUTROS PCT: 77.3 %
Neutrophil #: 5.9 x10 3/mm (ref 1.4–6.5)
Platelet: 210 x10 3/mm (ref 150–440)
RBC: 4.28 10*6/uL (ref 3.80–5.20)
RDW: 13.8 % (ref 11.5–14.5)
WBC: 7.6 x10 3/mm (ref 3.6–11.0)

## 2014-11-19 LAB — COMPREHENSIVE METABOLIC PANEL
ALT: 18 U/L
AST: 14 U/L — AB (ref 15–37)
Albumin: 3.6 g/dL (ref 3.4–5.0)
Alkaline Phosphatase: 78 U/L
Anion Gap: 5 — ABNORMAL LOW (ref 7–16)
BUN: 24 mg/dL — ABNORMAL HIGH (ref 7–18)
Bilirubin,Total: 0.3 mg/dL (ref 0.2–1.0)
CO2: 30 mmol/L (ref 21–32)
Calcium, Total: 9.8 mg/dL (ref 8.5–10.1)
Chloride: 108 mmol/L — ABNORMAL HIGH (ref 98–107)
Creatinine: 1.29 mg/dL (ref 0.60–1.30)
EGFR (African American): 51 — ABNORMAL LOW
GFR CALC NON AF AMER: 42 — AB
Glucose: 116 mg/dL — ABNORMAL HIGH (ref 65–99)
Osmolality: 290 (ref 275–301)
Potassium: 4.1 mmol/L (ref 3.5–5.1)
Sodium: 143 mmol/L (ref 136–145)
Total Protein: 7.3 g/dL (ref 6.4–8.2)

## 2014-11-20 LAB — CEA: CEA: 4.6 ng/mL (ref 0.0–4.7)

## 2014-12-03 ENCOUNTER — Ambulatory Visit: Payer: Self-pay | Admitting: Oncology

## 2015-02-07 DIAGNOSIS — H2513 Age-related nuclear cataract, bilateral: Secondary | ICD-10-CM | POA: Diagnosis not present

## 2015-02-15 ENCOUNTER — Other Ambulatory Visit: Payer: Self-pay | Admitting: Oncology

## 2015-02-15 DIAGNOSIS — Z1231 Encounter for screening mammogram for malignant neoplasm of breast: Secondary | ICD-10-CM

## 2015-02-22 NOTE — Op Note (Signed)
PATIENT NAME:  Emily [name], Emily [name] MR#:  [id number] DATE OF BIRTH:  Mar 23, 1933  DATE OF PROCEDURE:  02/09/2013  PREOPERATIVE DIAGNOSIS:  Advanced rectal cancer, need for central venous access.   POSTOPERATIVE DIAGNOSIS:  Advanced rectal cancer, need for central venous access.  OPERATIVE PROCEDURE:  Left subclavian PowerPort placement.   OPERATING SURGEON:  Hervey Ard.   ANESTHESIA:  Attended local, 10 mL of 1% plain Xylocaine.   ESTIMATED BLOOD LOSS:  Minimum.  CLINICAL NOTE:  This 81 year old woman was found to have advanced rectal cancer and was felt to be a candidate for neoadjuvant chemotherapy. Central venous access was requested by her treating oncologist.   OPERATIVE NOTE:  With the patient under adequate sedation, the left chest and neck was prepped with ChloraPrep and draped. Under ultrasound guidance, the subclavian vein was cannulated on a single stick. The guidewire was advanced followed by a dilator and the catheter. This was positioned in the junction of the SVC and right atrium under fluoroscopic guidance. The catheter was then tunneled to a pocket on the left anterior chest. The catheter easily irrigated and aspirated. The port was anchored with 3-point fixation of 3-0 Prolene. The wound was closed in layers with 3-0 Vicryl to the adipose tissue and a running 4-0 Vicryl subcuticular suture for the skin. Benzoin, Steri-Strips, Telfa and Tegaderm dressing applied.   Erect portable chest x-ray in the recovery room showed no evidence of pneumothorax and the catheter tip as described above.   ____________________________ [name], MD jwb:dmm D: 02/09/2013 10:53:26 ET T: 02/09/2013 11:33:16 ET JOB#: [id number]  cc: [name], MD, <Dictator> Kirstie Peri. Caryn Section, MD [place] Oliva Bustard, MD [place] Zhen Amedeo Kinsman MD ELECTRONICALLY SIGNED 02/09/2013 11:50

## 2015-02-22 NOTE — Consult Note (Signed)
CC: C. Diff colitis/diarrhea.  Pt may go tomorrow.  Diarrhea gone.  I will sign off, if diarrhea reoccurs in next 3 months get stool for C, diff PCR test and reconsult me.  Electronic Signatures: Manya Silvas (MD)  (Signed on 07-May-14 16:25)  Authored  Last Updated: 07-May-14 16:25 by Manya Silvas (MD)

## 2015-02-22 NOTE — Consult Note (Signed)
PATIENT NAME:  Emily [name], Emily [name] MR#:  [id number] DATE OF BIRTH:  01-19-1933  DATE OF CONSULTATION:  01/06/2013  REFERRING PHYSICIAN:   CONSULTING PHYSICIAN:  Rodena Goldmann III, MD  PRIMARY CARE PHYSICIAN:  Dr. Caryn Section.   ADMITTING PHYSICIAN:  Prime Doc.   CHIEF COMPLAINT:  Lower gastrointestinal bleeding.   BRIEF HISTORY OF PRESENT ILLNESS:  The patient is a pleasant 81 year old woman who is noted to have the onset of rectal bleeding over the last several months.  She has history of diverticular bleeding in the past and also has a history of hemorrhoids.  She was seen by her gastroenterologist who recommended colonoscopy.  She was set up for that procedure and had begun the prep when she began to have some significant rectal bleeding and syncope-like symptoms.  She presented to the Emergency Room with markedly abnormal potassium, a hemoglobin of 14.4.  She was noted to have guaiac-positive stool and some blood on the exam glove.  She is admitted to the hospital, resuscitated and prepared for colonoscopy.  She underwent colonoscopy on March 6th.  The colonoscopy did demonstrate a large fungating lesion at 5 cm from the anal verge.  It was biopsied and the biopsy results are unavailable.  There was a large pedunculated polyp noted at approximately 20 cm which was removed in total and there was an area of abnormal mucosa from 30 to 40 cm from the anal verge in the sigmoid colon suggestive of possible ischemic versus infectious colitis.  The patient has a history of known diverticulosis.  The primary service asked for a surgical consultation and she has been evaluated by oncology.  Her hemoglobin has stabilized at approximately 9 grams.  Electrolytes are improving and her creatinine is near normal.   PAST SURGICAL HISTORY:  The only previous surgical history is an appendectomy performed with an open procedure for a large appendiceal abscess.  She also has had hysterectomy in the past.    SOCIAL HISTORY:   She lives by herself.  Continues to smoke cigarettes on a regular basis, but does not have any significant alcohol usage.   PAST MEDICAL HISTORY:  Significant for diverticulosis and hypertension, but no evidence of cardiac disease or diabetes.   MEDICATIONS:  Are listed on her admission note.   REVIEW OF SYSTEMS:  As outlined, reviewed and I agree with those findings.   PHYSICAL EXAMINATION: GENERAL:  She is an alert, pleasant, comfortable woman in no significant distress.  VITAL SIGNS:  Her temperature is 98.1.  Blood pressure is 101/60, heart rate is 80 and regular.  Oxygen saturation 97% on room air.  HEENT:  Reveals no scleral icterus.  No facial deformity.  No pupillary abnormalities.  She does have some strabismus.  NECK:  Supple without adenopathy or tenderness, and she has a midline trachea.  CHEST:  Clear with no adventitious sounds and she has normal pulmonary excursion.  CARDIAC:  2/6 murmur heard best along the left sternal border.  She has no other gallop rhythms.  She has no cardiac dysrhythmia.  ABDOMEN:  Benign with no significant abdominal tenderness.  Normal bowel sounds.  No rebound.  No guarding.  No masses.  No hernias noted.  EXTREMITIES:  There is full range of motion.  No deformities.  PSYCHIATRIC:  Normal orientation, normal affect.   ASSESSMENT AND RECOMMENDATIONS:  I have talked with the patient in detail.  I reviewed the oncology consultation.  I do not have a documented plan of care from oncology, but  I would anticipate that further imaging will be recommended if indeed this lesion is malignant.  Staging imaging would be of benefit followed likely by chemoradiation as neoadjuvant therapy.  If she were to choose surgical intervention, we would discuss with her the opportunity for possible low anterior resection, but I suspect because of the low nature of the lesion and the fact that she will have undergone radiation and chemotherapy with the previous hysterectomy, she  would likely require abdominal perineal resection.  If she did have low anterior resection, which is a less likely possibility, she would probably need a diverting ileostomy to protect that anastomosis and radiated tissue.  A robotic approach to low anterior resection may be of some benefit in this woman since the tissue planes may be difficult to visualize with the standard or laparoscopic approach and the magnification and 3-D imaging with robotics may be of benefit in this case.  We will continue to follow her and be available to assist in her management.  I have discussed the plan with the patient and with her son.        ____________________________ Micheline Maze, MD rle:ea D: 01/06/2013 21:05:53 ET T: 01/07/2013 00:40:42 ET JOB#: [id number]  cc: Micheline Maze, MD, <Dictator> Kirstie Peri. Caryn Section, MD Jill Side, MD Dr. Oliva Bustard Rodena Goldmann MD ELECTRONICALLY SIGNED 01/08/2013 19:41

## 2015-02-22 NOTE — Consult Note (Signed)
CC: colostomy problems.  No bleeding, no C. diff, variable amt in bag at different times.  No specific treatment except low dose Imodium at this time.  Electronic Signatures: Manya Silvas (MD)  (Signed on 31-Oct-14 20:58)  Authored  Last Updated: 31-Oct-14 20:58 by Manya Silvas (MD)

## 2015-02-22 NOTE — Consult Note (Signed)
Details:   - Oncology point of view patient could go home.  I will arrange for outpatient workup   Electronic Signatures: Jobe Gibbon (MD)  (Signed 08-Mar-14 15:43)  Authored: Details   Last Updated: 08-Mar-14 15:43 by Jobe Gibbon (MD)

## 2015-02-22 NOTE — Consult Note (Signed)
Patient son did not believe that his mother with previous cancer surgery, weight loss,recurrent ostomy leakage, decreased oral intake has depression and because I consulted a psychiatrist to help with this he has asked me to withdraw from the case.  Electronic Signatures: Manya Silvas (MD)  (Signed on 02-Nov-14 08:56)  Authored  Last Updated: 02-Nov-14 08:56 by Manya Silvas (MD)

## 2015-02-22 NOTE — Consult Note (Signed)
patient with rectal mass seen today . detail note will follow. I will arrange for pet scan   and treatment planning as out patient , detail instruction was given to son   and daughter in law   Electronic Signatures: Jobe Gibbon (MD)  (Signed on 07-Mar-14 08:24)  Authored  Last Updated: 07-Mar-14 08:24 by Jobe Gibbon (MD)

## 2015-02-22 NOTE — Consult Note (Signed)
History of Present Illness:  Reason for Consult Chief Complaint/Diagnosis:  rectal cancer T3, N1, M0 tumor on chemoradiation therapy Hospitalizations secondary to diarrhea(April, 2014) chemoradiation intrupted. C. difficile diarrhea. 2.Status post abdominal perineal resection pT3  pNO stage II  disease.  (As September, 2014) patient underwent surgery at [facility]   HPI   81 year old lady was admitted in the hospital because of diarrhea.  Poor appetite.  Not able to eat.  According to family is she has been feeling weak tired.  Patient had a recent surgery for rectal cancer and the next few weeks he is going to go back and get reversal of colostomy.  Patient is somewhat depressed.  PFSH:  Additional Past Medical and Surgical History PAST MEDICAL HISTORY: Hypertension, hemorrhoids, diverticulosis.   PAST SURGICAL HISTORY: Appendectomy, hysterectomy.   PSYCHOSOCIAL HISTORY: Lives alone.  Smokes half pack a day. Denies any alcohol or illicit drug usage.   FAMILY HISTORY: Grandmother had breast cancer.  The patient's dad had end-stage renal disease.   Review of Systems:  General weakness  fatigue   Performance Status (ECOG) 1   HEENT no complaints   Lungs no complaints   Cardiac no complaints   GI as per HPI   GU no complaints   Musculoskeletal no complaints   Extremities no complaints   Skin no complaints   Neuro no complaints   Psych anxiety   NURSING NOTES: **Vital Signs.:   03-Nov-14 15:17   Vital Signs Type: Routine   Temperature Temperature (F): 98.2   Celsius: 36.7   Temperature Source: oral   Pulse Pulse: 73   Respirations Respirations: 20   Systolic BP Systolic BP: 443   Diastolic BP (mmHg) Diastolic BP (mmHg): 50   Mean BP: 66   Pulse Ox % Pulse Ox %: 96   Pulse Ox Activity Level: At rest   Oxygen Delivery: Room Air/ 21 %   Physical Exam:  General Patient is alert oriented not in acute distress   HEENT: normal   Lungs:  clear   Cardiac: regular rate, rhythm   Abdomen: soft  nontender  Colostomy is functioning well   Skin: intact   Musculoskeletal: No abnormality detected   Extremities: No swelling   Neuro: AAOx3  cranial nerves intact   Psych: depressed   Physical Exam LYMPHATICS:   No cervical, axillary, or inguinal lymphadenopathy     C-Diff:    Cancer, Colon or Rectal: Rectal Cancer - dx this year 2014.   Hemorrhoids:    htn:    Other, see comments: Rectal Tumor excision, Aug 2014   Colostomy: Aug 2014   Appendectomy: May 2009   Hysterectomy - Partial:    No Known Allergies:   Assessment and Plan: Impression:   Adenocarcinoma of the rectum.  .  status post chemoradiation therapy and status post rectal surgery.was admitted in the hospital with dehydration and moderate mild nutrition.has improved.is negative for C. difficileGen. condition is improved during hospitalization. had prolonged discussion with patient and son (on the telephone).main concern is patient not able to eat or drink at home and does better in the super reason He desires to place patient in  to rehabilitation.  I discussed situation with social worker was waiting forevaluation by physiotherapy. antidepressant medication like Celexa 10 mg daily Plan:      Electronic Signatures: Jobe Gibbon (MD)  (Signed [phone] 16:15)  Authored: HISTORY OF PRESENT ILLNESS, PFSH, ROS, NURSING NOTES, PE, PAST MEDICAL HISTORY, ALLERGIES, HOME MEDICATIONS, ASSESSMENT AND  PLAN   Last Updated: 03-Nov-14 16:15 by Jobe Gibbon (MD)

## 2015-02-22 NOTE — Consult Note (Signed)
rectal mass result is pendingis taking now PET scan done today,will evaluate patient as outpatient on Wednesday for further providing of treatment and these may include Tanisha radiation  chemotherapyversus chemotherapy(and if you find  distant  metaplastic disease)  I discussed  situation dictation ends he is in agreement with that  Electronic Signatures: Jobe Gibbon (MD)  (Signed on 10-Mar-14 09:48)  Authored  Last Updated: 10-Mar-14 09:48 by Jobe Gibbon (MD)

## 2015-02-22 NOTE — Consult Note (Signed)
Chief Complaint:  Subjective/Chief Complaint Case discussed with Dr. Candace Cruise this morning. I will be out of town today. Please call Dr. Candace Cruise or on call GI if needed. Nurse notified.   Electronic Signatures: Jill Side (MD)  (Signed 10-Mar-14 09:01)  Authored: Chief Complaint   Last Updated: 10-Mar-14 09:01 by Jill Side (MD)

## 2015-02-22 NOTE — Consult Note (Signed)
Chief Complaint:  Subjective/Chief Complaint Covering for Dr. Dionne Milo. Some low abd cramping. On solids now. Path pending.   VITAL SIGNS/ANCILLARY NOTES: **Vital Signs.:   08-Mar-14 04:39  Vital Signs Type Routine  Temperature Temperature (F) 98.3  Celsius 36.8  Temperature Source oral  Pulse Pulse 63  Respirations Respirations 20  Systolic BP Systolic BP 683  Diastolic BP (mmHg) Diastolic BP (mmHg) 53  Mean BP 74  BP Source  if not from Vital Sign Device non-invasive  Pulse Ox % Pulse Ox % 96  Pulse Ox Activity Level  At rest  Oxygen Delivery Room Air/ 21 %   Brief Assessment:  Cardiac Regular   Respiratory clear BS   Gastrointestinal mild low abd tenderness   Lab Results: Routine Hem:  08-Mar-14 05:04   WBC (CBC) 6.6  RBC (CBC)  2.84  Hemoglobin (CBC)  8.9  Hematocrit (CBC)  26.5  Platelet Count (CBC) 175  MCV 93  MCH 31.3  MCHC 33.5  RDW 13.5  Neutrophil % 59.2  Lymphocyte % 27.0  Monocyte % 11.5  Eosinophil % 1.9  Basophil % 0.4  Neutrophil # 3.9  Lymphocyte # 1.8  Monocyte # 0.8  Eosinophil # 0.1  Basophil # 0.0 (Result(s) reported on 07 Jan 2013 at [facility].)   Radiology Results: Cardiology:    03-Mar-14 21:05, ED ECG  Ventricular Rate 58  Atrial Rate 60  P-R Interval 168  QRS Duration 98  QT 450  QTc 441  P Axis 45  R Axis -24  T Axis 56  ECG interpretation   Junctional rhythm  Septal infarct (cited on or before 10-Mar-2008)  Abnormal ECG  When compared with ECG of 10-Mar-2008 21:45,  Junctional rhythm has replaced Sinus rhythm  Vent. rate has decreased BY  35 BPM  ----------unconfirmed----------  Confirmed by OVERREAD, NOT ([id number]), editor PEARSON, BARBARA ([id number]) on 01/06/2013 8:56:20 AM  ED ECG    Assessment/Plan:  Assessment/Plan:  Assessment Rectal mass, likely malignant but path not back. Colitis?   Plan Oncology to see patient today. Little to add from GI point of view. Wil check back tomorrow if patient still here.   Electronic  Signatures: Verdie Shire (MD)  (Signed 08-Mar-14 10:27)  Authored: Chief Complaint, VITAL SIGNS/ANCILLARY NOTES, Brief Assessment, Lab Results, Radiology Results, Assessment/Plan   Last Updated: 08-Mar-14 10:27 by Verdie Shire (MD)

## 2015-02-22 NOTE — Consult Note (Signed)
CC: C. diff diarrhea.  She is feeling better, able to eat some, diarrhea less frequent and forming up.  Recommend continue vancomycin for a total of 14 days.  May stop flagyl and cipro unless you have a non GI reason to use these.   Electronic Signatures: Manya Silvas (MD)  (Signed on 05-May-14 18:28)  Authored  Last Updated: 05-May-14 18:28 by Manya Silvas (MD)

## 2015-02-22 NOTE — Discharge Summary (Signed)
PATIENT NAME:  Emily [name], Emily [name] MR#:  [id number] DATE OF BIRTH:  1932-11-10  DATE OF ADMISSION:  09/01/2013 DATE OF DISCHARGE:  09/07/2013  DISCHARGE DIAGNOSES: 1.  Rectal cancer, status post colostomy.  2.  Chronic diarrhea due to malabsorption.  3.  Hypertension.  4.  Chronic renal disease.  5.  Moderate malnutrition.  6.  Mood disorder.   CONDITION ON DISCHARGE: Stable.   CODE STATUS: Full code.   MEDICATIONS ON DISCHARGE: 1.  Metoprolol 25 mg oral tablet extended-release 1/2 tablet once a day.  2.  Omeprazole 40 mg delayed-release capsule once a day.  3.  Loperamide 2 mg oral capsule 3 times a day as needed for diarrhea.  4.  Megestrol 40 mg/mL oral suspension 10 mL once a day.  5.  Ensure 240 mL oral 3 times a day.   DIET ON DISCHARGE: Low-sodium diet. Supplement: Ensure Plus. Consistency: Regular.   ACTIVITY: As tolerated and advised to have followup with PMD and oncologist, Dr. Oliva Bustard.  HISTORY OF PRESENT ILLNESS: On 31st of October, this 81 year old female with history of rectal cancer, underwent colonic resection and colostomy placement, presented to Emergency Room with increased colostomy output, which is totally watery, so came to Emergency Room. She had a history of C. difficile when she had surgery for colostomy and was treated with Flagyl for that. Recently she noticed 4 to 5 bags of colostomy output, causing her irritation on the skin and great inconvenience and so decided to come to Emergency Room. Denied any abdominal pain, fever, nausea or vomiting.   HOSPITAL COURSE AND STAY: As she was admitted with increased colostomy output and diarrhea, C. difficile was checked, which was negative, and GI consult was called in. GI, Dr. Vira Agar, saw the patient and he cleared the patient for further medical management and reassurance that this might be due to malabsorption, and as the patient had decreased oral intake because of fear of having output from colostomy bag, he suggested  to get psychiatry consult. We consulted psych and we gave loperamide to control her diarrhea and reassured her about having liquidy stool in the colostomy bag because of malabsorption. Psychiatry started her on some anti-depression medication because of her adjustment disorder with this new situation. Overall, she remained stable with some ups and downs in her electrolytes in the hospital course, and because of her overall weakness and weight loss secondary to cancer, we arranged for her to go to rehab.  Other medical issues in this hospital course:  1.  Acute kidney injury. This was present on admission, possibly due to dehydration and not having enough oral intake, but after IV fluid it resolved. 2.  Hypertension was very well controlled with metoprolol.  3.  Mood disorder. As mentioned above, psychiatry consult called in and they suggested Prozac.  CONSULTS [place]: Psychiatry consult with Dr. Camie Patience. GI consult with Dr. Vira Agar. Oncology consult with Dr. Oliva Bustard.  IMPORTANT LABORATORY RESULTS IN THE HOSPITAL: White cell count was 5.8 on admission. Hemoglobin was 12.9, platelet count 381. Creatinine was 1.79, sodium 136 and potassium 4.5. CEA was 5.5. Hemoglobin was 12.7. Glucose 118, creatinine 1.62; potassium was 4. C. difficile was negative and stool culture did not show any Salmonella or Shigella, no E. coli and no Campylobacter. Her creatinine was 1.79 on admission, gradually came down to 1.16 after IV fluids.   TOTAL TIME SPENT IN THIS DISCHARGE: 40 minutes.   ____________________________ Ceasar Lund Anselm Jungling, MD vgv:jcm D: 09/08/2013 13:15:24 ET T: 09/08/2013  13:33:24 ET JOB#: [id number]  cc: Ceasar Lund. Anselm Jungling, MD, <Dictator> Martie Lee. Oliva Bustard, MD Kirstie Peri Caryn Section, MD Vaughan Basta MD ELECTRONICALLY SIGNED 09/18/2013 8:15

## 2015-02-22 NOTE — Consult Note (Signed)
Chief Complaint:  Subjective/Chief Complaint No specific complaints. Somewhat lethargic.   VITAL SIGNS/ANCILLARY NOTES: **Vital Signs.:   09-Mar-14 05:30  Vital Signs Type Routine  Temperature Temperature (F) 97.7  Celsius 36.5  Temperature Source oral  Pulse Pulse 71  Respirations Respirations 20  Systolic BP Systolic BP 338  Diastolic BP (mmHg) Diastolic BP (mmHg) 67  Mean BP 88  Pulse Ox % Pulse Ox % 98  Pulse Ox Activity Level  At rest  Oxygen Delivery Room Air/ 21 %   Brief Assessment:  Cardiac Regular   Respiratory clear BS   Gastrointestinal Normal   Lab Results: Routine Chem:  07-Mar-14 04:48   Glucose, Serum 98  BUN 7  Creatinine (comp) 1.20  Sodium, Serum 142  Potassium, Serum 4.1  Chloride, Serum  112  CO2, Serum  20  Calcium (Total), Serum  8.3  Anion Gap 10  Osmolality (calc) 281  eGFR (African American)  50  eGFR (Non-African American)  44 (eGFR values <37m/min/1.73 m2 may be an indication of chronic kidney disease (CKD). Calculated eGFR is useful in patients with stable renal function. The eGFR calculation will not be reliable in acutely ill patients when serum creatinine is changing rapidly. It is not useful in  patients on dialysis. The eGFR calculation may not be applicable to patients at the low and high extremes of body sizes, pregnant women, and vegetarians.)  Routine Hem:  07-Mar-14 04:48   WBC (CBC) 7.7  RBC (CBC)  2.84  Hemoglobin (CBC)  8.9  Hematocrit (CBC)  26.5  Platelet Count (CBC) 169  MCV 94  MCH 31.5  MCHC 33.7  RDW 14.0  Neutrophil % 60.3  Lymphocyte % 27.3  Monocyte % 10.0  Eosinophil % 1.9  Basophil % 0.5  Neutrophil # 4.6  Lymphocyte # 2.1  Monocyte # 0.8  Eosinophil # 0.2  Basophil # 0.0 (Result(s) reported on 06 Jan 2013 at 05:21AM.)  08-Mar-14 05:04   WBC (CBC) 6.6  RBC (CBC)  2.84  Hemoglobin (CBC)  8.9  Hematocrit (CBC)  26.5  Platelet Count (CBC) 175  MCV 93  MCH 31.3  MCHC 33.5  RDW 13.5   Neutrophil % 59.2  Lymphocyte % 27.0  Monocyte % 11.5  Eosinophil % 1.9  Basophil % 0.4  Neutrophil # 3.9  Lymphocyte # 1.8  Monocyte # 0.8  Eosinophil # 0.1  Basophil # 0.0 (Result(s) reported on 07 Jan 2013 at 06:04AM.)   Assessment/Plan:  Assessment/Plan:  Assessment Rectal mass. Path pending.   Plan To f/u with oncology. Will sign off. If further questions, contact Dr. IDionne Milo Thanks.   Electronic Signatures: OVerdie Shire(MD)  (Signed 09-Mar-14 09:19)  Authored: Chief Complaint, VITAL SIGNS/ANCILLARY NOTES, Brief Assessment, Lab Results, Assessment/Plan   Last Updated: 09-Mar-14 09:19 by OVerdie Shire(MD)

## 2015-02-22 NOTE — Consult Note (Signed)
PATIENT NAME:  Emily [name], Emily [name] MR#:  [id number] DATE OF BIRTH:  26-Dec-1932  DATE OF CONSULTATION:  05/14/2013  REFERRING PHYSICIAN:   CONSULTING PHYSICIAN:  Simonne Come. Inez Pilgrim, MD  HISTORY OF PRESENT ILLNESS:  Emily [name] 81 year old patient known to me and primarily followed by Emily [name] in the [place]. She was admitted on July 12, with abdominal pain. She had epigastric pain radiating to the back, nausea. She had some vomiting. She was seen by initially the Emergency Room and then admitted by the hospitalists, she was in the Emergency Room yesterday after calling me with similar symptoms at the Emergency Room, she was actually finally first admitted early this morning. She is found to have abnormality of the gallbladder, but did not have acute cholecystitis. As per surgeon's evaluation, CT scan shows a gallstone in the gallbladder neck and some gallbladder distention, but no acute cholecystitis and actually by today, her pain is resolved. Her diet is being advanced. She is being followed by surgery in the hospital. She is afebrile. She has a history of rectal cancer has had a short course of chemotherapy prior but was complicated by diarrhea and C. difficile and so this was discontinued. She has completed radiation treatment two weeks ago. She is planned for surgical follow-up at [facility] in three days' time and is planned for rectal cancer resection. Oncology is consulted for comanagement and to evaluate, given her recent cancer surgeries.   PAST MEDICAL HISTORY: The patient had a T3N1 lesion. She also had intraluminal, intramucosal cancer and a villous adenoma more distally. She has been staged with a PET scan. Her past history also includes diverticulosis, appendectomy and hysterectomy.    HOME MEDICATIONS:  Are listed as:  Carafate 1 gram 4 times a day, peridium 100 mg twice a day, Phenergan p.r.n. Zofran p.r.n., nystatin swish and swallow, Nexium 40 mg daily MiraLAX p.r.n., metoprolol , potassium  20 mEq daily and oral iron 325 mg daily.   ALLERGIES: No known drug allergies.   SOCIAL HISTORY: No alcohol and there is a history of smoking half pack of cigarettes daily.   REVIEW OF SYSTEMS: Her symptoms, which started a few days ago and were more severe yesterday and brought her to the Emergency Room have really resolved. She is starting to take p.o. now, tolerated liquids and is going to go on to clear liquid diet. She has a mild appetite. She has no current abdominal back pain or bloating. No fever, chills, or sweats. No headache or dizziness. No chest pain. No bruising or rash. No numbness of extremities. No focal weakness. No dysuria or hematuria.   PHYSICAL EXAMINATION: GENERAL: Alert and cooperative, slight pallor. No acute distress. She was noted to be confused on admission but I find her alert and oriented with no confusion currently. No gross focal weakness. Moving all extremities against gravity in the bed. I did not test her gait. No thrush.  LYMPHATICS: No palpable lymph nodes in the neck, supraclavicular, submandibular.  LUNGS: Clear. No wheezing or rales.  HEART: Regular. Abdomen is nondistended and soft and not tender. No edema.   LABORATORY, DIAGNOSTIC AND RADIOLOGIC DATA: On admission white count 8.3, hemoglobin 10.7, platelets 279, hemoglobin was then down to 9.8 since admission. Liver functions were normal. Creatinine was unremarkable.   CT as already noted, showed distended gallbladder with a stone in the gallbladder neck.    IMPRESSION AND PLAN: The patient is with underlying cancer status post radiation and abbreviated course of chemotherapy. This has  been described as having N1 disease previously. She is scheduled for surgical evaluation in two days and then plan for cervical resection at [facility]. Initially planned for diverting colostomy, but later for reanastomosis. The patient under consideration for additional FOLFOX chemotherapy later. The patient has some anemia, but  she has had anemia recently and  this appears to be attributed to malignancy and effects of radiation treatment. There has been no active bleeding.   IMPRESSION AND PLAN: Nothing active or new from the oncology point of view. No evidence on CT of progressive cancer. Her symptoms are currently not considered to be related to cancer. Gallbladder to be followed as per surgery. Again, if no acute problems as pain is resolved as patient can progress on diet, she will be discharged to keep her [facility] surgical appointment. Ongoing and needs to be determined if she should have her gallbladder removed prophylactically to prevent flare, acute complications in the area of post surgical time after colon surgery or if gallbladder is only to be removed if she has recurrent attacks. Otherwise, would continue to follow the CBC, watch the hemoglobin. The patient can be seen in follow up tomorrow by Emily [name] and was followed her throughout her chemotherapy course.    ____________________________ Simonne Come. Inez Pilgrim, MD rgg:cc D: 05/14/2013 14:27:08 ET T: 05/14/2013 17:26:45 ET JOB#: [id number]  cc: Simonne Come. Inez Pilgrim, MD, <Dictator> Dallas Schimke MD ELECTRONICALLY SIGNED 05/31/2013 15:44

## 2015-02-22 NOTE — Consult Note (Signed)
PATIENT NAME:  Emily [name], Emily [name] MR#:  [id number] DATE OF BIRTH:  25-Feb-1933  DATE OF CONSULTATION:  03/05/2013  CONSULTING PHYSICIAN:  Haru Anspaugh K. Zarahi Fuerst, MD  SUBJECTIVE:  The patient was seen in consultation in room #125.  The patient is a 81 year old white female who is widowed for several years and lives by herself in a townhouse.  Her son and daughter-in-law visit as often as they can, and this happens to be once a day.  The patient has multiple physical problems which include stage III rectal cancer and is receiving chemotherapy through a pump mechanism Monday through Friday.  She is planning to move in with her son upon hospital discharge according to information obtained.  The patient was seen in consultation for depression.    MENTAL STATUS EXAM: The patient lying in bed, alert and oriented to place, person and time, calm, pleasant and cooperative.  No agitation.  Affect is flat, mood depressed.  She admits feeling hopeless and helpless.  She admits feeling worthless and useless because of her inability to care for herself.  No psychosis.  She denies any suicidal or homicidal ideas or plans but wants to get help and feels lonely and depressed.  Cognition is intact.  General knowledge of information is fair.  Insight and judgment are guarded.   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   RECOMMENDATIONS:  Increase her Celexa to 20 mg p.o. every day for better control of her depression.   ____________________________ Wallace Cullens. Franchot Mimes, MD skc:cb D: 03/05/2013 21:42:20 ET T: 03/05/2013 23:04:42 ET JOB#: [id number]  cc: Arlyn Leak K. Franchot Mimes, MD, <Dictator> Dewain Penning MD ELECTRONICALLY SIGNED 03/11/2013 22:08

## 2015-02-22 NOTE — Consult Note (Signed)
Had long talk with son about patient.  She is scheduled to have reanastamosis of her ostomy to the ano rectal area in  a month at [facility].  She has had failure to thrive in last 8 weeks.  She hates the ostomy and is not eating or drinking much so she will have to not fiddle with the ostomy bag as much.  He would very much like her to go to nursing home for the next 4 weeks so she can get close care and better than current home nourishment.  She has lost 5 lbs in last month from not eating., about 30 lbs since radiation started.  was tried on Remeron but did not tolerate side effects.  Son requests consultation for something for depression which might help her appetite.    spoke to Hospitalist on the case about their concerns and needs.  Electronic Signatures: Manya Silvas (MD)  (Signed on 01-Nov-14 15:11)  Authored  Last Updated: 01-Nov-14 15:11 by Manya Silvas (MD)

## 2015-02-22 NOTE — H&P (Signed)
PATIENT NAME:  Emily [name], Emily [name] MR#:  [id number] DATE OF BIRTH:  11-19-1932  DATE OF ADMISSION:  01/02/2013  PRIMARY CARE PHYSICIAN:  Lelon Huh, MD  REFERRING PHYSICIAN:  Pollie Friar, MD  CHIEF COMPLAINT: Abdominal pain, dizziness and blood in the stool.   HISTORY OF PRESENT ILLNESS: The patient is a 81 year old female with a past medical history of hypertension, hemorrhoids and diverticula who is scheduled to get colonoscopy by Dr. Dionne Milo, gastroenterologist this morning. The patient started taking bowel prep this evening and she started with 2 Dulcolax and then she took 3 glasses of MiraLAX at around 15 hours. Following that she started having abdominal pain with cramping and went to the bathroom. In the bathroom she felt very dizzy and felt like she was going to pass out.  She vomited x1. She  has noticed blood in her stool. Spouse called EMS and the patient was brought into the ER. Her potassium was extremely low at 2.2 and potassium chloride 20 mEq IV was ordered by the ER physician. The patient denies any chest pain, shortness of breath or palpitations. She denies any passing out.  She was still complaining of lower abdominal pain with pressure and stool with blood. Stool for blood was positive in the ER and the patient has hemorrhoids as reported by the ER physician. Magnesium is at 2.5. WBC is elevated at 18. INR is 0.9. PT is 12.5. Urinalysis was revealed nitrate negative and leukocyte esterase 2+. The patient denies any other complaints. I had called the GI on call. Dr. Dionne Milo, who has suggested to start the patient on clear liquid diet and that he might consider scoping the patient on Wednesday once hypokalemia is resolved.   PAST MEDICAL HISTORY: Hypertension, hemorrhoids, diverticulosis.   PAST SURGICAL HISTORY: Appendectomy, hysterectomy.   PSYCHOSOCIAL HISTORY: Lives alone.  Smokes half pack a day. Denies any alcohol or illicit drug usage.   FAMILY HISTORY: Grandmother had  breast cancer.  The patient's dad had end-stage renal disease.   HOME MEDICATIONS: Metoprolol succinate 20 mg half tablet once a day, Klor-Con mEq once daily, hydrochlorothiazide 1 tablet p.o. once daily dose unknown,.  REVIEW OF SYSTEMS:   CONSTITUTIONAL:  Complaining of weakness and fatigue and abdominal pain. Denies any weight loss or weight gain. Denies fevers.  EYES: Denies blurry vision, glaucoma, cataracts.  EARS, NOSE, THROAT: No tinnitus, ear pain, snoring, postnasal drip. RESPIRATORY:  Denies cough, COPD, hemoptysis.  CARDIOVASCULAR: No chest pain, palpitations or syncope.  Complaining of dizziness. GASTROINTESTINAL:  The patient had 1 episode of vomiting at home.  Denies any hematemesis. Complaining of lower abdominal pain, positive blood in the stool.  GENITOURINARY: No dysuria or hematuria. GYNECOLOGIC:  Denies any breast mass or vaginal discharge.   ENDOCRINE:  Denies any polyuria, nocturia or thyroid problems. HEMATOLOGIC/LYMPHATIC:  Denies any anemia or easy bruising. INTEGUMENT:  No acne, rash, lesions.  MUSCULOSKELETAL: No neck pain, back pain, shoulder pain. Denies any gout. NEUROLOGIC:  No vertigo, ataxia or dementia.  PSYCHIATRIC: No insomnia, ADD, OCD.   PHYSICAL EXAMINATION: VITAL SIGNS: Temperature 97.5, pulse 63, respirations 18, blood pressure 166/72, pulse ox 96%. GENERAL APPEARANCE: Not in any acute distress, moderately built and moderately nourished.  HEENT: Normocephalic, atraumatic. Pupils are equally reactive to light and accommodation. Extraocular movements are intact. No sinus tenderness. No nasal discharge. No postnasal drip.  External ear canals are intact. NECK: Supple. No JVD.  No thyromegaly.  Range of motion is intact.  LUNGS: Clear to auscultation bilaterally. No accessory  muscle use and no anterior chest wall tenderness on palpation.  CARDIAC: S1, S2 normal. Regular rate and rhythm. No murmurs. No peripheral edema.  GASTROINTESTINAL: Soft. Bowel  sounds are positive in all 4 quadrants. No rebound tenderness.  Tender in the lower quadrants, both left and right. No masses felt. No hepatosplenomegaly. NEUROLOGIC:  Awake alert and oriented x 3. Motor and sensory are grossly intact.  Reflexes are 2+. BACK:  Examination shows no CVA tenderness.   EXTREMITIES: No edema. No cyanosis. No clubbing.  PSYCHIATRIC: Normal mood and affect.  MUSCULOSKELETAL: No joint effusion. No tenderness. No erythema.  SKIN: No acne, rash, lesions. Normal turgor. Warm to touch. 2+ peripheral pulses.  LABORATORY AND DIAGNOSTIC DATA:  A 12-lead EKG has revealed sinus bradycardia at 58 beats per minute, junctional rhythm, age undetermined septal infarct.  Glucose 130, BUN 27, creatinine 1.1, sodium 139, potassium 4.0, chloride 104, CO2 23, GFR 48, anion gap 12, serum osmolality 284, calcium 7.9. Magnesium 2.5, total protein 5.1, albumin 2.8, ALT, AST are within normal limits, bilirubin total and alkaline phosphatase are normal. WBC 18.1, hemoglobin 14.4, hematocrit 44.1, platelet count 251, MCV 94, PT 12.5, INR 0.9. Urinalysis cloudy in appearance, glucose negative, bilirubin negative, ketones negative, nitrite negative, leukocyte esterase 2+, hyaline casts are present 14 in low-power field.  ASSESSMENT and PLAN:  The patient is a 81 year old female taking bowel prep for a.m. colonoscopy felt dizzy and started having abdominal pain associated with blood in her stool.  She will be admitted with the following assessment and plan:   1.  Lower abdominal pain at the lower gastrointestinal bleed.  Admit to tele bed. We will start her on clear liquid diet. Protonix 40 mg intravenous q.12 hours. Check CBC in a.m. Repeat hemoglobin and hematocrit at 3:00 a.m. Hemodynamically the patient is stable. Gastrointestinal  consult is placed and discussed with Dr. Dionne Milo who has recommended to start the patient on clear liquid diet. He might consider doing a colonoscopy on Wednesday once the  patient's  hypokalemia is resolved.  2.  Hypokalemia. Potassium chloride 20 mEq intravenous and potassium chloride 40 mEq p.o. was given. Will continue intravenous fluids with potassium supplements.  3.  Acute cystitis.  Urine cultures are ordered. We will give her intravenous levofloxacin.  4.  Hypertension.  Her blood pressure is stable at this time. We will give her antihypertensive as needed.  5. Gastrointestinal prophylaxis with Protonix and deep vein thrombosis prophylaxis with sequential compression devices.   The diagnosis and plan of care was discussed in detail with the patient. She is agreeable with the plain.   Total time spent on the admission: 50 minutes.   ____________________________ Nicholes Mango, MD ag:ct D: 01/03/2013 01:00:19 ET T: 01/03/2013 07:34:53 ET JOB#: [id number]  cc: Nicholes Mango, MD, <Dictator> Kirstie Peri. Caryn Section, MD Jill Side, MD  Nicholes Mango MD ELECTRONICALLY SIGNED 01/06/2013 2:15

## 2015-02-22 NOTE — Consult Note (Signed)
Due to uncertain effects on vancomycin and the C. diff will suspend the ferrous sulfate for now.  Electronic Signatures: Manya Silvas (MD)  (Signed on 02-May-14 13:04)  Authored  Last Updated: 02-May-14 13:04 by Manya Silvas (MD)

## 2015-02-22 NOTE — Consult Note (Signed)
Chief Complaint:  Subjective/Chief Complaint Colonoscopy done. Full report in chart.  Impression: Diverticular bleeding. Sigmoid polyp. Hemorrhoids.  Recommendations: Clear liquid diet. Follow H and H. If signs of acrive GI bleed please obtain stat bleeding scan and inform vascular surgeon of active bleeding. Discussed with Dr. Bridgett Larsson.   Electronic Signatures: Jill Side (MD)  (Signed 04-Mar-14 15:47)  Authored: Chief Complaint   Last Updated: 04-Mar-14 15:47 by Jill Side (MD)

## 2015-02-22 NOTE — Discharge Summary (Signed)
PATIENT NAME:  Emily [name], Emily [name] MR#:  [id number] DATE OF BIRTH:  [birth year]  DATE OF ADMISSION:  05/14/2013 DATE OF DISCHARGE:  05/15/2013  ADMITTING DIAGNOSIS: Gastritis.  DISCHARGE DIAGNOSES:  1. Biliary colic.  2. Gallstones in gallbladder neck region with distended gallbladder.  3. Hypertension.  4. History of colorectal cancer, status post chemotherapy and radiation therapy. 5. History of diverticulosis. 6. History of hemorrhoids.   DISCHARGE CONDITION: Stable.   DISCHARGE MEDICATIONS: The patient is to resume her outpatient medications, which are: 1. Zofran 4 mg every 4 hours as needed.  2. Klor-Con 20 mg p.o. daily.  3. Potassium chloride 20 mg p.o. daily.  4. Metoprolol succinate 25 mg p.o. 1/2 tablet once daily.  5. Iron sulfate 325 mg p.o. once at bedtime.  6. Nystatin oral suspension 5 mL 4 times daily swish and swallow.  7. Promethazine 25 mg every 6 hours as needed.  8. Nexium 40 mg p.o. once daily.  HOME OXYGEN: None.   DIET: 2 grams salt, low fat, low cholesterol, regular consistency.   ACTIVITY LIMITATIONS: As tolerated.   FOLLOW-UP APPOINTMENT:  Dr. Bronwen Betters in [facility] in 1 day. Dr. Oliva Bustard in the next 2 days.    CONSULTANTS: Dr. Inez Pilgrim, Dr. Rexene Edison, Dr. Oliva Bustard.   RADIOLOGIC STUDIES: Three-way abdominal x-ray including PA of chest showed no acute cardiopulmonary disease. Emphysematous lung disease was demonstrated. No evidence of bowel obstruction or perforation was noted. CT scan of abdomen and pelvis without contrast 05/13/2013, revealed a distended gallbladder with a stone appearing to be present in the area of the gallbladder.  Correlate clinically (Dictation Anomaly)Nonvisualization   of the appendix. Correlate with surgical history.  Colonic diverticulosis without evidence of acute diverticulitis was also noted. Ultrasound of abdomen, limited survey, 05/14/2013 revealed cholelithiasis with distended gallbladder.  The stone appears to be  in the gallbladder neck region and is nonmobile.  There is a trace amount of fluid adjacent to the gallbladder wall.  The wall is not thickened.  No physiologic common bile duct dilatation was noted, although the measurement was significantly higher than the others at 8.5 mm.  The mean measurement was 5.6 mm, which would be within normal limits. There is no intrahepatic biliary ductal dilatation. Pancreatic ductal dilatation is 3.1 mm noted. Correlate with clinical and laboratory evidence of acute cholecystitis. Surgical consultation was suggested. The patient is a 81 year old Caucasian female with past medical history significant for history of colonic carcinoma who presents to the hospital on 05/14/2013 with complaints of abdominal pain, nausea as well as vomiting.  Please refer to Dr. Rinaldo Ratel admission on 05/14/2013. On arrival to the hospital, the patient's temperature was 98.8, pulse was 57, respiratory rate was 20, blood pressure 189/79, saturation was 96% on room air.   PHYSICAL EXAM: Unremarkable. The patient had no abdominal tenderness after IV medications for pain.   LABORATORY DATA: The patient's lab data done on the day of admission 05/13/2013, showed elevation of glucose to 168, BUN was 22, otherwise BMP was unremarkable. Estimated GFR for a normal African American would be 46. Liver enzymes were unremarkable; however, albumin level was somewhat low at 3.1. Cardiac enzymes, troponin was less than 0.02.  White blood cell count was normal at 8.3, hemoglobin was 10.7, platelet count was 279. Urinalysis was unremarkable. However, it was somewhat yellow, hazy, 50 mg/dL glucose was noted, negative for bilirubin or ketones. Specific gravity was 1.014, pH was 5.0, 1+ blood, negative for protein, nitrites or leukocyte esterase, 4 red blood  cells as well as 4 white blood cells were noted. No bacteria was seen, 6 epithelial cells as well as mucus was present. The patient was admitted to the hospital with  diagnosis of abdominal pain, likely gastritis. The patient was consulted by Dr. Rexene Edison, surgeon.  Dr. Rexene Edison saw the patient in consultation the same day, 05/16/2013. He felt that the patient clinically does not have acute cholecystitis and her pain resolved. He did not feel that the patient needs emergent cholecystectomy.  He recommended to continue patient and if she develops discomfort then they would revisit the issue. However, Dr. Rexene Edison felt that they would prefer to avoid cholecystectomy. She is to have surgery in approximately 2 weeks and he would prefer not to delay that surgery. The patient was initiated on clear liquid diet and advanced to low fat, low cholesterol diet.  On 05/15/2013, she was able to tolerate a diet and she had no symptomatology. Her vital signs as well as labs were checked and her liver enzymes remained stable. I felt the patient is ready to be discharged home. We tried to reach surgeons however, as they were not available for assessment.   ASSESSMENT AND PLAN: Again, there was no symptoms, clinical or concerns of pain during examination. It was felt the patient is stable to be discharged and that she is to follow up however, at [facility] in the next day; in fact, on 05/16/2013, and make decisions about a possible cholecystectomy during her colectomy operation. The patient was also consulted by Dr. Inez Pilgrim, oncologist on call.  Dr. Inez Pilgrim felt that there was no evidence on CT of progression of cancer and her symptoms were not considered to be related to cancer.  Dr. Inez Pilgrim felt that if patient has no acute pain and the patient is able to progress on diet, she will be discharged to follow up with [facility] for colectomy evaluation. Dr. Inez Pilgrim,  however, felt the patient may need to have her gallbladder removed prophylactically to prevent flare up of complications in the area of postsurgical time of the colon surgery or in the gallbladder. Dr.  Oliva Bustard is to see patient today and he will relate all findings and concerns to the patient's surgical list team at [facility]. On the day of discharge, the patient was (Dictation Anomaly)comfortable, did not  complain of any significant discomfort. Her vitals were stable with temperature of 97.6, pulse was 79, respirations were 18 to 20, blood pressure 116/54, saturation was 96% on room air at rest.   TIME SPENT: 40 minutes.   ____________________________ Theodoro Grist, MD rv:rw D: 05/15/2013 16:26:50 ET T: 05/15/2013 17:36:48 ET JOB#: [id number]  cc: Theodoro Grist, MD, <Dictator> Janak K. Oliva Bustard, MD  Theodoro Grist MD ELECTRONICALLY SIGNED 05/25/2013 11:37

## 2015-02-22 NOTE — Discharge Summary (Signed)
PATIENT NAME:  Emily [name], Emily [name] MR#:  [id number] DATE OF BIRTH:  Dec 23, 1932  DATE OF ADMISSION:  01/02/2013.  DATE OF DISCHARGE:  01/09/2013.  DISCHARGE DIAGNOSES:  Lower GI bleed, diverticulitis, colitis, rectal mass, possible malignancy, anemia, stable, acute renal failure, improved.   PRIMARY CARE PHYSICIAN:  Lelon Huh, MD  PRIMARY ONCOLOGIST: [name], MD.   HISTORY OF PRESENTING ILLNESS:  The patient is a 81 year old female with a past medical history of hypertension, hemorrhoids and diverticula, scheduled to get colonoscopy with Dr. Dionne Milo, and started taking bowel preparation in the evening   took 3 glasses of MiraLAX, and around 3:00 p.m. started having abdominal pain, went to the bathroom, felt very dizzy and vomited x 1. Noticed some blood in her stool and came to the Emergency Room. Stool for blood was positive by ER. The patient had hemorrhoids. Urinalysis was also positive on admission.   HOSPITAL COURSE AND STAY:  For gastrointestinal bleed, she was admitted and hemoglobin was monitored. She again started on preparation while in the hospital and colonoscopy was done. 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 Biopsy was done, and a 5 mm polyp in sigmoid colon resected, diverticulosis of the sigmoid colon mucosa ulceration, ischemia versus infection, possible colitis, internal hemorrhoids. And so possibility of this mass, Surgery and Oncology consult was called in. As due to weekend, Oncology wanted to do PET scan to do staging, and due to weekend, it was not possible. She stayed over the weekend, tolerated diet very well. Her hemoglobin remained stable and her weakness and overall status improved slowly, and so after PET scan was done on Monday, she was discharged home with advice to follow up with Dr. [name] in the clinic. Surgeon, Dr. Marina Gravel, also saw her but she wanted to see exact diagnosis, biopsy result and PET scan result before following with any surgeon  doctor.   OTHER MEDICAL ISSUES: 1.  Anemia due to blood loss. Her hemoglobin dropped slightly due to dilution but then remained stable.  2.  Colitis. On colonoscopy there were findings suggestive of colitis and she was started on metronidazole oral and discharged with the same.  3.  Hypokalemia, replaced with KCl oral and improved.  4.  Cystitis and leukocytosis. Urinalysis was positive. We treated with Levaquin.  5.  Hypertension, which was controlled very well with Lopressor.  6.  Acute renal failure presented on admission, improved with IV fluid on followup BMP.  [place]:  On presentation, WBC was 18,000 and hemoglobin was 14.4, creatinine was 1.1, potassium was 2.2. Urinalysis was +5 WBCs and 2+ leukocyte esterase, 3+ bacteria. Culture was suggestive of contamination. INR was normal 0.9, hemoglobin dropped to 10.7, may be dilation. Creatinine went up to 1.57. Finally hemoglobin remained stable a level of 8.9 for the last 3 days without any further drop and her creatinine on discharge was 1.2.   CONSULTS DURING THE HOSPITAL STAY: Dr. Vira Agar for GI, Dr. [name] for the Oncology, Dr. Sherri Rad for Surgery.   CONDITION ON DISCHARGE:  Stable.   CODE STATUS:  FULL CODE.   DISCHARGE MEDICATIONS:  Metoprolol 25 mg oral once a day, metronidazole 250 mg oral tablet every 8 hours for 7 days, pantoprazole 40 mg once a day, Ensure 3 times a day, Cipro 250 mg oral tablet every 12 hours for 7 days and Feosol 325 mg oral tablet 3 times a day.  DIET ON DISCHARGE:  Regular.   DIET CONSISTENCY:  Regular.  ACTIVITY:  As tolerated.   TIMEFRAME TO FOLLOWUP:  In 1 to 2 weeks with Dr. Oliva Bustard. Dr. Oliva Bustard agreed to give her an appointment on coming Wednesday, on March 12th, in the clinic.   TOTAL TIME SPENT ON THIS DISCHARGE:  45 minutes.    ____________________________ Ceasar Lund Anselm Jungling, MD [id number] D: 01/13/2013 22:36:00 ET T: 01/14/2013 14:55:33  ET JOB#: [id number]  cc: Kirstie Peri. Caryn Section, MD [place] Oliva Bustard, MD Ceasar Lund Anselm Jungling, MD, <Dictator> Vaughan Basta MD ELECTRONICALLY SIGNED 01/30/2013 9:29

## 2015-02-22 NOTE — Consult Note (Signed)
Chief Complaint:  Subjective/Chief Complaint Two small bowel movements reported by nurse this afternoon befotre initiating Gatorade. Patient seen. No new symptoms. Bleeding is minimal and therefore bleeding scan will not be helpful and will continue to prep for a colobnoscopy as a well prepped colonoscopy will be very helpfull.   Electronic Signatures: Jill Side (MD)  (Signed 05-Mar-14 16:39)  Authored: Chief Complaint   Last Updated: 05-Mar-14 16:39 by Jill Side (MD)

## 2015-02-22 NOTE — Consult Note (Signed)
Brief Consult Note: Diagnosis: GI bleed.   Comments: will follow if rebleeds nuc scan and possible embolization.  Electronic Signatures: Hortencia Pilar (MD)  (Signed 04-Mar-14 19:19)  Authored: Brief Consult Note   Last Updated: 04-Mar-14 19:19 by Hortencia Pilar (MD)

## 2015-02-22 NOTE — Consult Note (Signed)
CC: C. Diff infection.  She is starting to slow down in diarrhea with less volume, no bleeding per nurse today.  ABd only slightly tender. Expect continued improvement over weekend.  Dr. Candace Cruise on call for me but will not see unless you call him.  Electronic Signatures: Manya Silvas (MD)  (Signed on 02-May-14 17:24)  Authored  Last Updated: 02-May-14 17:24 by Manya Silvas (MD)

## 2015-02-22 NOTE — Consult Note (Signed)
PATIENT NAME:  Emily [name], SANAGUSTIN MR#:  [id number] DATE OF BIRTH:  14-Jul-1933  DATE OF CONSULTATION:  01/03/2013  CONSULTING PHYSICIAN:  Jill Side, MD PRIMARY CARE PHYSICIAN: Kirstie Peri. Fisher, MD   REASON FOR ADMISSION: Abdominal pain, diarrhea and hematochezia.   HISTORY OF PRESENT ILLNESS: The patient is a 81 year old female with a somewhat complicated GI history. The patient was initially sent to me in July 2013 by Dr. Caryn Section secondary to small amount of rectal bleeding and heme positive stool. The patient was recommended to have a colonoscopy done, and colonoscopy was scheduled, although for some reason the patient did not come for her procedure. Apparently for the last 2 to 3 months, the patient has been having problems with some diarrhea as well as rectal bleeding on and off. About 10 days ago, apparently the patient had more significant hematochezia and was seen by Dr. Caryn Section in his office. GI referral was made, and the patient was seen by me 3 days ago on Friday. At the time of that evaluation, according to the patient, the bleeding had almost completely subsided with only a minimal amount of rectal bleeding after bowel movement. She denied any nausea, vomiting, hematemesis or coffee-ground emesis. According to her, her appetite was poor; and she had not been eating well, although she was able to drink and take Ensure without any problems. Physical examination was quite unremarkable and was negative for any signs of significant dehydration or acute abdominal pathology. The patient was recommended a colonoscopy as previously, especially since she is now losing weight as well. She agreed to colonoscopy, and colonoscopy was rescheduled for today.  The patient had some concern about using a large volume of GoLYTELY and Dulcolax, and she was recommended to take only 2 tablets of Dulcolax and drink only 1/2 of the GoLYTELY unless she is not clean, which she should drink the entire volume. Apparently  the patient took the Dulcolax yesterday without any problems. She did not have any results Dulcolax and then she started to drink GoLYTELY. According to the patient, drinking GoLYTELY made her sick. She threw up, and then she started to have some abdominal cramping followed by diarrhea and bleeding. She started to feel dizzy. She came to the Emergency Room where here vitals  were stable. She was not tachycardic or hypertensive. Hemoglobin was normal at around 12.8. Her potassium was low at 2.2. I was called by the Emergency Room physician, and decision was made to admit the patient under hospitalist care for correction of her potassium. It was decided to keep the patient on a clear liquid diet. As the patient has  had enough GoLYTELY, it seemed improbable that she would be able to have a colonoscopy done today, and it was decided that that will be addressed later. The case was later discussed with the admitting hospitalist, and the same plan was discussed with her as well. The patient was evaluated this morning. She continues to have some bloody bowel movements. She is complaining of some left lower quadrant abdominal pain. There has been no further nausea or vomiting. She overall feels weak.   PAST MEDICAL HISTORY: Significant for history of hypertension, hemorrhoids and diverticulosis; but I do not believe she has ever had a colonoscopy.   PAST SURGICAL HISTORY: Significant for ruptured appendix which was repaired requiring a laparotomy, also history of hysterectomy.   SOCIAL HISTORY: She smokes about 1/2 pack a day.   FAMILY HISTORY: Positive for breast cancer in grandmother.   REVIEW OF  SYSTEMS: As above.   PHYSICAL EXAMINATION: GENERAL: Frail, elderly female, does not appear to be in any acute distress, does not appear to be anemic or jaundiced.  VITAL SIGNS: Her vitals are fairly stable with a heart rate in the 70s, mostly respirations 18 to 20, blood pressure about 120 to 150/60 to 80.  HEENT:  Grossly unremarkable.  NECK: Veins are flat.  LUNGS: Grossly clear to auscultation bilaterally with fair air entry.  CARDIOVASCULAR: Regular rate and rhythm. No gallops or murmur.  ABDOMEN: Examination showed minimal left lower quadrant tenderness. There is no rebound or guarding. Bowel sounds are positive and hyperactive. The rest of the abdominal examination is quite benign.  EXTREMITIES: No edema.  NEUROLOGIC: Examination appears to be unremarkable.   LABORATORY AND RADIOLOGICAL DATA:  White cell count was 18,000 on admission. After hydration it is down to 13,000. Hemoglobin was 14.4 on admission, is down to 12.8 with hydration, and most recent hemoglobin is 11.8. Platelet count is normal at 218.  PT and INR are within normal limits. BUN 33, creatinine 1.57. Serum sodium is 135. Potassium was 2.2, is now 3.4. Magnesium is normal.   ASSESSMENT AND PLAN: The patient is with what appears to be diverticular bleeding. Apparently she had an episode of significant bleeding 10 days ago and then another episode last night quite consistent with diverticular bleed. In between, the patient has been having some diarrhea with some rectal bleeding and weight loss, raising concerns about other lesions as well such as colon polyps, infectious or inflammatory colitis, as well as colonic ischemia. Internal hemorrhoids are also possible. The pattern of the bleeding is different at different times, raising concerns about possible multiple lesions in the colon as mentioned above. The patient has been admitted to the hospital. I will continue to follow hemoglobin and hematocrit.  He is being hydrated.  A full colonoscopy is not possible, and therefore a sigmoidoscopy was recommended and agreed by the patient and her family. Please see the full report in chart. A sigmoidoscopy did show extensive diverticulosis, blood throughout the sigmoid and descending colon, a single large polyp in the sigmoid colon which does not appear  to be the cause of acute significant bleeding as noted above. Some hemorrhoids were noted. Examination was fairly limited due to the presence of blood in stool, and therefore polypectomy was not attempted although 1 mL of epinephrine was injected into the base of the polyp; but polypectomy was not possible due to the presence of large volume of blood in stool and poor visualization. The case has been discussed with Dr. Delana Meyer from Vascular Surgery. The plan has been discussed with Dr. Bridgett Larsson as well. In case the patient shows signs of significant active bleeding, we will obtain a bleeding scan followed by an angiogram with embolization. In case the bleeding continues to decrease and eventually subside, the patient will require a full colonoscopy with polypectomy and evaluation of the rest of the colon. I will maintain her on a clear liquid diet, follow H and H closely, and further recommendations depending upon the hospital course over the next 12 to 24 hours. The plan has been discussed extensively with the patient's family members.  The patient's son was quite concerned this morning, and his questions have been answered and concerns have been addressed. The patient's family seems to be in full agreement with our current management plan have no further concerns. The case has been discussed with Dr. Delana Meyer and Dr. Bridgett Larsson as mentioned above. Further  recommendations to follow.   ____________________________ Jill Side, MD si:cb D: 01/03/2013 15:58:29 ET T: 01/03/2013 16:18:31 ET JOB#: [id number]  cc: Jill Side, MD, <Dictator> Jill Side MD ELECTRONICALLY SIGNED 01/04/2013 10:47

## 2015-02-22 NOTE — H&P (Signed)
PATIENT NAME:  Emily [name], Emily [name] MR#:  [id number] DATE OF BIRTH:  1933/02/06  DATE OF ADMISSION:  05/14/2013  PRIMARY CARE PHYSICIAN: Dr. Lelon Huh.  REFERRING PHYSICIAN: Dr. Beather Arbour.   CHIEF COMPLAINT: Epigastric abdominal pain with nausea and vomiting.   HISTORY OF PRESENT ILLNESS: The patient is a 81 year old pleasant Caucasian female with past medical history of hypertension, hemorrhoids, is presently diagnosed with colorectal cancer and is scheduled for the rectal surgery at [facility] in 1 week. The patient is reporting that she has finished her chemotherapy and radiation therapy just lately. The patient comes into the ER with 1-day history of epigastric abdominal pain associated with nausea and vomiting. In the ER, the patient was given antinausea medications and IV fluids, but she is still having intractable nausea and vomiting. CAT scan of the abdomen and pelvis was done without contrast, which showed tiny gallstones; regarding which, right upper quadrant ultrasound is done, which has revealed echogenic focus in the gallbladder neck compatible with tiny gallstone and some dilatation of the common bile duct with trace amount of pericholecystic fluid. Oncologist, Dr. Rexene Edison, was consulted regarding this who has recommended medical admission as the patient's white count is normal and LFTs are not elevated. During my examination, the patient denies any abdominal pain, as she has received pain medications. She was getting IV fluids. Denies any other complaints. She denies any fever. No similar complaints in the past. Denies any chest pain or shortness of breath.   PAST MEDICAL HISTORY: Hypertension, diverticulosis, recent diagnosis of colorectal cancer status post chemotherapy and radiation therapy, hemorrhoids.   PAST SURGICAL HISTORY: Appendectomy, hysterectomy.   PSYCHOSOCIAL HISTORY: Lives alone, smokes half pack a day. Denies any alcohol or illicit drug usage. The patient is trying to quit  smoking.   FAMILY HISTORY: The patient's dad had end-stage renal disease. Grandmother had breast cancer.   HOME MEDICATIONS:  Sucralfate 1 gram p.o. 4 times a day, promethazine 25 mg q.6 hours, nystatin, thiamine p.o. 4 times a day, Nexium 40 mg once daily, metoprolol succinate 1/2 tablet once a day, Klor-Con 20 mEq once daily, iron sulfate 325 mg once daily.  REVIEW OF SYSTEMS:  CONSTITUTIONAL: Denies any fever, fatigue, weakness. Complaining of epigastric abdominal pain. No weight loss or weight gain.  EYES: Denies any blurry vision, glaucoma.  EARS, NOSE, THROAT: Denies any ear pain, hearing loss.  RESPIRATION: Denies cough, COPD.  CARDIOVASCULAR: No chest pain or palpitations.  GASTROINTESTINAL: Complaining of nausea, vomiting and epigastric abdominal pain. No diarrhea.  GENITOURINARY: No dysuria or hematuria.  GYNECOLOGICAL: Denies any breast mass or vaginal discharge.  ENDOCRINE: Denies any polyuria, nocturia, thyroid problems.  HEMATOLOGIC AND LYMPHATICS:  Chronic history of anemia is present. No easy bruising or bleeding.  INTEGUMENTARY: No acne, rash, lesions.  MUSCULOSKELETAL: No joint pain in the neck, back, shoulder.  NEUROLOGICAL: Denies any history of vertigo, ataxia, dementia.  PSYCHIATRIC: No ADD, OCD, bipolar disorder.   PHYSICAL EXAMINATION: VITAL SIGNS: Temperature 98.8, pulse 57, respirations 20, blood pressure is 189/79, pulse oximetry 96%.  GENERAL APPEARANCE: Not in acute distress. Moderately built and moderately nourished.  HEENT: Normocephalic, atraumatic. Pupils are equally reactive in light and accommodation. No scleral icterus. No conjunctival injection. No sinus tenderness. No postnasal drip.  NECK: Supple. No JVD. No thyromegaly. No  lymphadenopathy. Range of motion is intact.  LUNGS: Clear to auscultation bilaterally. No accessory muscle use and no anterior chest wall tenderness on palpation.  CARDIAC: S1, S2 normal. Regular rate and rhythm. No murmurs.  GASTROINTESTINAL: Soft. Bowel sounds are positive in all 4 quadrants. No abdominal tenderness, as the patient just received IV pain medication. No hepatosplenomegaly. No rebound tenderness.  NEUROLOGIC: Awake, alert, oriented x3. Cranial nerves II through XII are intact. Motor and sensory are intact. Reflexes are 2+.  EXTREMITIES: No edema. No cyanosis. No clubbing.  SKIN: Warm to touch. Normal turgor. No rashes. No lesions.  MUSCULOSKELETAL: No joint effusion, tenderness or erythema.  PSYCHIATRIC: Normal mood and affect.   LABORATORY AND IMAGING STUDIES:  Urinalysis: Yellow in color, hazy in appearance, leukocyte esterase and nitrites are negative. WBC 8.3, hemoglobin 10.7, hematocrit 31.8,  platelets 279. LFTs are normal, except for albumin low at 3.1, troponin less than 0.02. Glucose 168, BUN 22, creatinine 1.15, sodium 140, potassium 4.4. Abdominal x-ray, 3-view, no acute cardiopulmonary pathology.  No evidence of bowel obstruction or perforation. CAT scan of the abdomen and pelvis without the contrast have revealed distended gallbladder with a stone appearing to be present in the area of the gallbladder neck, correlate clinically. Correlate with surgical history. Colonic diverticulosis without definite evidence of acute diverticulitis. Ultrasound of the right upper quadrant has revealed echogenic focus in the gallbladder neck compatible with a tiny gallstone. There is some dilation of the common bile duct with decreased amount of pericholecystic fluid. Findings can be seen in cholecystitis.   ASSESSMENT AND PLAN: A 81 year old Caucasian female presenting to the emergency room with a chief complaint of 1-day history of nausea, vomiting and epigastric abdominal pain. Will be admitted with the following assessment and plan.  1.  Acute epigastric abdominal pain with nausea and vomiting, probably from combination of acute gastritis and  acute cholecystitis. We will keep her n.p.o., provide her IV fluids  and pain management with morphine IV. Surgical consult is placed to Dr. Rexene Edison.  We will repeat a.m. labs including LFTs, amylase and lipase. Pain management with morphine as-needed basis.  2.  Hypertension. Blood pressure is elevated, probably from pain. We will provide her pain management.  3.  Colorectal cancer. We will consult Oncology. 4.  History of diverticulosis, stable at this time.  5.  We will provide her gastrointestinal and deep vein thrombosis prophylaxis.  6.  She is DO NOT RESUSCITATE.   The diagnosis and plan of care were discussed with the patient. The patient reports that her daughter will be her medical POA.   Total time spent on admission is 50 minutes.    ____________________________ Nicholes Mango, MD ag:nts D: 05/14/2013 02:40:41 ET T: 05/14/2013 02:58:14 ET JOB#: [id number]  cc: Nicholes Mango, MD, <Dictator> Nicholes Mango MD ELECTRONICALLY SIGNED 05/27/2013 1:18

## 2015-02-22 NOTE — Consult Note (Signed)
Chief Complaint:  Subjective/Chief Complaint No BM since last night. No nausea or vomiting.   VITAL SIGNS/ANCILLARY NOTES: **Vital Signs.:   05-Mar-14 07:38  Vital Signs Type Routine  Temperature Temperature (F) 98.7  Celsius 37  Temperature Source oral  Pulse Pulse 76  Respirations Respirations 18  Systolic BP Systolic BP 277  Diastolic BP (mmHg) Diastolic BP (mmHg) 63  Mean BP 79  Pulse Ox % Pulse Ox % 94  Pulse Ox Activity Level  At rest  Oxygen Delivery Room Air/ 21 %   Brief Assessment:  Additional Physical Exam Overall comfortable. Non toxic or septic. Mild LLQ abdominal tenderness without rebound.   Lab Results: Routine Chem:  05-Mar-14 05:02   Glucose, Serum  136  BUN 18  Creatinine (comp)  1.37  Sodium, Serum 139  Potassium, Serum  3.0  Chloride, Serum 106  CO2, Serum 25  Calcium (Total), Serum 8.6  Anion Gap 8 (Result(s) reported on 04 Jan 2013 at 06:50AM.)  Osmolality (calc) 282  Routine Hem:  05-Mar-14 00:04   Hemoglobin (CBC)  10.8 (Result(s) reported on 04 Jan 2013 at 12:17AM.)    05:02   WBC (CBC) 9.7  RBC (CBC)  3.03  Hemoglobin (CBC)  10.1  Hematocrit (CBC)  28.2  Platelet Count (CBC) 172  MCV 93  MCH 33.2  MCHC 35.7  RDW 13.2  Neutrophil % 72.9  Lymphocyte % 16.7  Monocyte % 9.7  Eosinophil % 0.4  Basophil % 0.3  Neutrophil #  7.1  Lymphocyte # 1.6  Monocyte # 0.9  Eosinophil # 0.0  Basophil # 0.0 (Result(s) reported on 04 Jan 2013 at 06:50AM.)   Assessment/Plan:  Assessment/Plan:  Assessment Lower GI bleed most likely diverticular, clinically resolved. Sigmoid polyp noted on sigmoidoscopy yesterday. ? Colitis as suggested by LLQ tenderness alhough her WBC count is now normal and she is afebrile.   Plan Continue Levaquin. Will prep and plan for full colonoscopy tomorrow as there are no signs of active bleeding and this gives [place] a window of opportunity to complete evaluation. Procedure was discussed with her and her family  members and they are in full agreement. Xhange Protonix as there are no signs of upper GI bleed.   Electronic Signatures: Jill Side (MD)  (Signed 05-Mar-14 09:45)  Authored: Chief Complaint, VITAL SIGNS/ANCILLARY NOTES, Brief Assessment, Lab Results, Assessment/Plan   Last Updated: 05-Mar-14 09:45 by Jill Side (MD)

## 2015-02-22 NOTE — H&P (Signed)
PATIENT NAME:  Emily [name], Emily [name] MR#:  [id number] DATE OF BIRTH:  03-Dec-1932  DATE OF ADMISSION:  09/01/2013  PRIMARY CARE PHYSICIAN: Dr. Lelon Huh.   REFERRING PHYSICIAN: Dr. Marta Antu.   CHIEF COMPLAINT: Increased colostomy output.   HISTORY OF PRESENT ILLNESS: Emily [name] is a 81 year old female with a history of rectal cancer. Underwent colonic resection and colostomy placement. Undergoing  therapy. Presented to the Emergency Department with increased colostomy output. The patient had profuse diarrhea after the surgery and was diagnosed with C. diff colitis which was treated with Flagyl. The patient states that she has been having 4 to 5 bags of colostomy output which is causing her to have irritated skin. It is profuse watery output. The patient states she has had significant weight loss. Has good appetite; however, concerned about eating. Limits herself from eating concerned about the diarrhea. Denies having any fever. Denies having any abdominal pain. Concerning this, came to the Emergency Department. No obvious signs of dehydration are noted. The patient's son also stated that the patient needs to be placed in a nursing home as the patient is unable to stay by herself; however, the patient refuses this.   PAST MEDICAL HISTORY:  1. Hypertension.  2. Rectal cancer. Underwent surgery in September 2014 at [facility]. The patient refused chemotherapy.  3. History of C. difficult colitis.  4. Appendectomy.  5. Hysterectomy.   ALLERGIES: No known drug allergies.   HOME MEDICATIONS:  1. Omeprazole 40 mg daily.  2. Klor-Con 20 mEq daily.  3. Toprol-XL  once a day.  4.  mg once a day.   SOCIAL HISTORY: Continues to smoke. Denies drinking alcohol or using illicit drugs. Has a son who follows her up closely. Independent of ADLs.   FAMILY HISTORY: Grandmother with breast cancer. Father with end-stage renal disease.   REVIEW OF SYSTEMS:  CONSTITUTIONAL: Generalized weakness.  Weight loss.   EYES: No change in vision.  ENT: No change in hearing.  RESPIRATORY: No cough, shortness of breath.  CARDIOVASCULAR: No chest pain, palpitations.  GASTROINTESTINAL: Has no nausea or vomiting. Has increased colostomy output.  GENITOURINARY: No dysuria or hematuria.  ENDOCRINE: No polyuria or polydipsia.  HEMATOLOGIC: No easy bruising or bleeding.  MUSCULOSKELETAL: No joint pains and aches.  NEUROLOGIC: No  or numbness in any part of the body.   PHYSICAL EXAMINATION:  GENERAL: This is a frail-looking female lying down in the bed, not in distress.  VITAL SIGNS: Temperature 97.5, pulse 67, blood pressure 111/57, respiratory rate of 16, oxygen saturation 98% on room air.  HEENT: Head normocephalic, atraumatic. There is no scleral icterus. Conjunctivae normal. Pupils equal and react to light. Extraocular movements are intact. Mucous membranes: Mild dryness. No pharyngeal erythema.  NECK: Supple. No lymphadenopathy. No JVD. No carotid bruit. No thyromegaly.  CHEST: Has no focal tenderness.  LUNGS: Bilaterally clear to auscultation.  HEART: S1 and S2 regular. No murmurs are heard. No pedal edema. Pulses 2+.  ABDOMEN: Has colostomy bag with watery greenish stools. No fecal material is noted. Abdomen has mild tenderness. No rebound or guarding.  NEUROLOGIC: The patient is alert, oriented to place, person and time. Cranial nerves II through XII intact. Motor 5/5 in upper and lower extremities.  SKIN: No rash or lesions.  MUSCULOSKELETAL: Good range of motion in all of the extremities.   LABORATORIES: UA negative for nitrites and leukocyte esterase. CMP: BUN 51, creatinine of 1.62, calcium 11.8. The rest of all of the values are within normal  limits.   CBC is completely within normal limits.   ASSESSMENT AND PLAN: Emily [name] is a 80 year old female who comes to the Emergency Department with increased colostomy output.  1. Increased colostomy output: Will check the Clostridium  difficile toxin. Will also consult gastroenterology. This seems to be more of an osmotic diarrhea. Also, consider adding Questran.  2. Chronic renal insufficiency: Some mild dehydration with elevated BUN. Will continue with intravenous fluids and follow up.  3. Hypertension: Currently well controlled.  4. Rectal cancer: Follows up with Dr. Oliva Bustard. The patient refused chemotherapy.  5. Debility: Will involve physical therapy and occupational therapy. The patient does not want to be placed in a nursing home. Will need to have a discussion after being evaluated by physical therapy.   TIME SPENT: 50 minutes.   ____________________________ Monica Becton, MD pv:gb D: 09/01/2013 04:53:59 ET T: 09/01/2013 05:10:40 ET JOB#: [id number]  cc: Monica Becton, MD, <Dictator> Kirstie Peri. Caryn Section, MD Monica Becton MD ELECTRONICALLY SIGNED 09/02/2013 3:23

## 2015-02-22 NOTE — Consult Note (Signed)
Pt CC is C. diff diarrhea.  Spoke to Dr. Jeb Levering and will cancel the Mg oxide due to tendency to cause diarrhea.  Would continue vancomycin for total of 14 days,  there is at least a 20% relapse rate given her overall condition and the disease.    Electronic Signatures: Manya Silvas (MD)  (Signed on 06-May-14 17:07)  Authored  Last Updated: 06-May-14 17:07 by Manya Silvas (MD)

## 2015-02-22 NOTE — H&P (Signed)
PATIENT NAME:  Emily [name], Emily [name] MR#:  [id number] DATE OF BIRTH:  Feb 23, 1933  DATE OF ADMISSION:  02/27/2013  PRIMARY CARE PHYSICIAN: Dr. Lelon Huh.   ONCOLOGY: Dr. Oliva Bustard.   REFERRING PHYSICIAN: Dr. Marjean Donna.   CHIEF COMPLAINT: Generalized weakness and decreased p.o. intake.    HISTORY OF PRESENT ILLNESS: The patient is a 81 year old Caucasian female with a recent diagnosis of stage III rectal cancer on chemotherapy as well as radiation therapy with 5-fluorouracil pump. Has been not feeling well for the past few days following chemotherapy. The patient usually will be started on 5-fluorouracil pump from Monday through Friday, and the pump will be disconnected on Friday as reported by the patient's family. As the patient is supposed to restart her chemotherapy today via 5-fluorouracil pump, she was unable to go to Dr. Metro Kung office as scheduled. She was feeling weak and tired and has been not eating or drinking well for the past few days. The patient's son has called Dr. Oliva Bustard who has recommended to send the patient to the ER for hydration. During my examination, the patient is complaining of extreme weakness and hematochezia associated with abdominal cramps. She has reported that she has been persistently having bloody diarrhea since the chemotherapy which was anticipated before. The patient denies any chest pain or shortness of breath. No fevers either. She has no appetite and has been not eating or drinking for the past few days. Denies any dizziness or loss of consciousness.   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   PAST SURGICAL HISTORY: Appendectomy, hysterectomy.   ALLERGIES: The patient has no known drug allergies.   PSYCHOSOCIAL HISTORY: Lives at home. Lives alone. Still smoking but has cut down to 1 to 2 cigarettes per day.  Denies any alcohol or illicit drug usage.   FAMILY HISTORY: Grandmother had breast cancer. The patient's father had end-stage renal disease.   HOME MEDICATIONS: Pantoprazole 40 mg once daily. Zofran 4 mg 1 tablet p.o. q.4 hours. Metoprolol succinate 25 mg 1/2 tablet orally once a day. Potassium chloride 20 mEq 1 tablet p.o. once a day. Hydrochlorothiazide/triamterene 50/75 p.o. once daily. Iron sulfate 325 mg once a day. Tylenol 1 tablet p.o. 4 to 6 hours.   REVIEW OF SYSTEMS:  CONSTITUTIONAL: Denies any fever but complaining of fatigue and weakness. Complaining of abdominal cramps. No weight gain or weight loss.  EYES: No blurry vision, inflammation, glaucoma.  ENT: Denies any epistaxis, discharge, snoring, postnasal drip.  RESPIRATIONS: No cough or wheezing. Denies any COPD.   CARDIOVASCULAR: No chest pain, palpitations or syncope.  GASTROINTESTINAL: The patient is feeling nauseous. Denies any vomiting. Having diarrhea with blood. Positive abdominal cramps. No jaundice.  GENITOURINARY: No dysuria or hematuria.  GYNECOLOGIC AND BREASTS: No breast mass or vaginal discharge.  ENDOCRINE: No polyuria, nocturia or thyroid problems.  HEMATOLOGIC AND LYMPHATIC: Denies any easy bruising but complaining of rectal bleeding.   INTEGUMENTARY: No acne, rash, lesions.  MUSCULOSKELETAL: No joint pain in the neck, back, shoulder, knee or hip. Denies any gout.  NEUROLOGIC: No vertigo, ataxia, dementia, weakness, dysarthria, epilepsy.  PSYCHIATRIC: Denies insomnia, ADD, OCD.   PHYSICAL EXAMINATION:  VITAL SIGNS: Temperature 98.2, pulse 75, respirations 16, blood pressure is 128/57. Pulse ox is 100%.  GENERAL APPEARANCE: Not in acute distress. Moderately built and moderately nourished. Looks weak and tired.  HEENT: Normocephalic, atraumatic. Pupils are equally reacting to light and accommodation. Extraocular movements are  intact. No scleral icterus. No sinus tenderness. No postnasal drip. No pharyngeal  exudate.  NECK: Supple. No JVD. No thyromegaly. Range of motion is intact.  LUNGS: Clear to auscultation bilaterally. No accessory muscle usage. No anterior chest wall tenderness on palpation.  CARDIAC: S1, S2 normal. Regular rate and rhythm. No murmur. Intact Port-A-Cath in the left side of the anterior chest wall. No erythema noticed. No edema.  GASTROINTESTINAL: Soft. Bowel sounds are positive in all 4 quadrants. Hyperactive bowel sounds. Diffuse generalized tenderness is present. No hepatosplenomegaly. No rebound tenderness.  NEUROLOGIC: Awake, alert and oriented x3. Cranial nerves II through XII are intact. Motor and sensory are intact, and reflexes are 2+.  SKIN: No lesions or rashes. Warm to touch. Dry in nature. Decreased turgor.  EXTREMITIES: No edema. No cyanosis, clubbing.  MUSCULOSKELETAL: No joint effusion, tenderness or erythema.   LABS AND IMAGING STUDIES: Glucose is 148, BUN 34, creatinine 1.63, sodium 135, potassium 3.4, chloride 99, CO2 27. GFR 30. Anion gap is 9. Serum osmolality 280. Calcium 10.6. LFTs: Total protein is 6.7, albumin 3.0, total bili 0.4, alkaline phosphatase 50, AST 15, ALT 11. Troponin less than 0.02. WBC 2.5, hemoglobin 10.5, hematocrit 31.0, platelet count is 199, MCV 93. URINALYSIS: Yellow in color, clear in appearance, glucose negative, bili negative, ketones negative, specific gravity 1.025, pH 6.0, blood is 1+, protein negative, nitrite negative, leukocyte esterase negative. Mucus is present.   ASSESSMENT AND PLAN: A 81 year old female recently diagnosed with stage III rectal cancer arising in tubulovillous adenoma, on chemotherapy and radiation treatment with 5-fluorouracil by continuous infusion via Port-A-Cath from 02/13/2013, is presenting to the Emergency Room with a chief complaint of generalized weakness and decreased p.o. intake.  1. Acute kidney injury: It is from prerenal etiology because of dehydration. Plan is to hydrate her with intravenous fluids.  The patient has received 2 liters of fluid bolus in the Emergency Room. Will avoid nephrotoxins. Monitor renal function closely. If necessary, will consider nephrology consult. Acute renal insufficiency is presumed to be from decreased p.o. intake related to chemoradiation therapy.  2. Failure to thrive and dehydration: The plan is to give her gentle hydration with intravenous fluids. Dietitian consult is ordered. Will provide her Ensure 1 can daily.  3. Hypokalemia: Will provide her intravenous fluids with potassium supplement.  4. Stage III rectal cancer on chemoradiation therapy with 5-fluorouracil and currently off from the chemoradiation therapy. Oncology consult is placed to Dr. Oliva Bustard.  5. Hypertension: Will resume home medications except hydrochlorothiazide.  6. Will provide her gastrointestinal prophylaxis with Protonix and deep vein thrombosis prophylaxis with SCDs.   The patient is DNR. The patient's son, Mr. Kwanza Cancelliere, is the patient's medical POA. His contact information: Home number [phone] and mobile number is [phone].   The diagnosis and plan of care was discussed in detail with the patient and her family who are at bedside.   TOTAL TIME SPENT ON ADMISSION: 50 minutes.    ____________________________ Nicholes Mango, MD ag:gb D: 02/28/2013 00:06:28 ET T: 02/28/2013 00:41:12 ET JOB#: [id number]  cc: Nicholes Mango, MD, <Dictator> Kirstie Peri. Caryn Section, MD [place] Oliva Bustard, MD Nicholes Mango MD ELECTRONICALLY SIGNED 03/03/2013 5:31

## 2015-02-22 NOTE — Consult Note (Signed)
PATIENT NAME:  Emily [name], Emily [name] MR#:  [id number] DATE OF BIRTH:  May 04, 1933  DATE OF CONSULTATION:  09/01/2013  REFERRING PHYSICIAN:  Dr. Lunette Stands  CONSULTING PHYSICIAN:  Gaylyn Cheers, MD/Zaniyah Wernette A. Jerelene Redden, ANP (Adult Nurse Practitioner).  PRIMARY CARE PHYSICIAN:  Dr. Lelon Huh.   REASON FOR CONSULTATION:  Increased colostomy output.   HISTORY OF PRESENT ILLNESS:  This 81 year old, pleasant Caucasian female has a past medical history of hypertension, rectal cancer treated locally with chemoradiation by Dr. Jeb Levering. She  underwent surgery in June 28, 2013, at [facility] for abdominal perineal resection and has a colostomy.  She has declined further chemotherapy. The patient also had an admission in May 2014 with C. difficile colitis.  She reports increasing stool output from her colostomy bag that waxes and wanes. She says she has had diarrhea more often than not since she had the colostomy bag placed in September. She has noted no blood or melena in the bag. Some days she may change her bag anywhere from 3 to 6 times per day, usually not at night. She is noticing leakage, and skin excoriation around the bag. She presented to the Emergency Room with this concern, and she was subsequently hospitalized. Stool studies have returned negative for C. difficile. She has been seen by the wound care care nurse.  The patient has reported 5 pound weight loss, negative for nausea, vomiting, abdominal pain and reports excellent appetite. GI is asked to see her for the diarrhea.   PAST MEDICAL HISTORY:  1.  Hypertension.  2.  Rectal cancer, T3, N1, M0 tumor, on chemoradiation therapy.   3.  Hospitalization secondary to C. difficele  diarrhea, April 2014. 4.  Diverticulosis.  5.  A history of hemorrhoids.   PAST SURGICAL HISTORY:  1.  Appendectomy.  2.  Hysterectomy, 3.  Abdominal perineal resection, [facility] June 28, 2013.  ALLERGIES:  NKDA.     HOME MEDICATIONS:   1.  Omeprazole 40 mg daily.  2.   Klor-Con 20 mEq daily.  3.  Toprol-XL 25 mg.   HABITS:  Positive tobacco, lifelong negative alcohol. The patient lives at home and her son follows her closely.   FAMILY HISTORY:  Grandmother with breast cancer. Father with end-stage renal disease.   REVIEW OF SYSTEMS:  Ten systems reviewed. The patient reports weakness, 5 pound weight loss over the last two weeks, positive long-standing weight loss. The patient unable to identify. She feels a little woozy when she stands, feels a little unsteady on her feet. Feels mild shortness of breath with walking. She has recently stopped driving. GI history as noted. Otherwise negative review of systems.   PHYSICAL EXAMINATION: VITAL SIGNS:  97.4, 71, 18, 115/64, 98% on room air.  GENERAL:  Thin, frail, chronically ill-looking Caucasian female, resting in bed. The family is not present.  HEENT:  Head is normocephalic. Conjunctivae pink. Anicteric.  NECK:  Supple. Trachea midline.  LUNGS:  Breath sounds are bilateral, clear. Respirations are nonlabored.  CARDIAC:  S1, S2, without murmur.  ABDOMEN:  Soft, nontender, colostomy bag on the right with thick, brown stool, non-formed, thick liquid.  NEUROLOGIC:  The patient just recently awakened from sleep, a little groggy, becomes oriented to person and place, a fair historian but vague on details of her cancer history and hospitalizations.  SKIN:  Without rash or edema.  MUSCULOSKELETAL:  Good range of motion, able to turn about in bed and assists with position changes. No joint swelling noted.  LABORATORY, DIAGNOSTIC, AND RADIOLOGICAL DATA:  Admission lab work notable for glucose 146, BUN 42, creatinine 1.79, albumin 4.1, alkaline phosphatase 137, calcium 11.5. WBC 5.8, hemoglobin 12.9, platelet count 281.   LABORATORY STUDIES 08/31/2013:  BUN 51, creatinine 1.62. CPK-MB and troponin negative x 1 set. CBC remains unremarkable. C. difficile negative. Urinalysis unremarkable.   RADIOLOGY:  No Radiology  reports to review.   IMPRESSION:  The patient with rectal cancer history, T3, N1, M0 tumor, treated initially with chemoradiation therapy that was interrupted. She is status post abdominal perineal resection at [facility] and with colostomy. The patient was admitted for diarrhea. Fortunately, her C. difficile returns negative. The etiology and extent of her  diarrhea is unclear. I am not certain how much of her colon was removed.   PLAN:  1.  Recommend stool for comprehensive culture.  2.  Trial of loperamide t.i.d. p.r.n. per Dr. Vira Agar recommendation.  3.  No abdominal pain, tenderness and excellent appetite and diet at this time. We will continue with regular diet and monitor patient's response.   Further GI recommendations pending. This case was discussed with Dr. Vira Agar in collaboration of care.  Thank you for this consultation.   These services provided by Denice Paradise, MS, APRN, BC, ANP, under collaborative agreement with Dr. Gaylyn Cheers.   ____________________________ Janalyn Harder. Jerelene Redden, ANP (Adult Nurse Practitioner) kam:jm D: 09/01/2013 15:56:26 ET T: 09/01/2013 16:30:17 ET JOB#: [id number]  cc: Joelene Millin A. Jerelene Redden, ANP (Adult Nurse Practitioner), <Dictator> Janalyn Harder Sherlyn Hay, MSN, ANP-BC Adult Nurse Practitioner ELECTRONICALLY SIGNED 09/01/2013 17:32

## 2015-02-22 NOTE — Consult Note (Signed)
Brief Consult Note: Diagnosis: Hematochezia.   Patient was seen by consultant.   Discussed with Attending MD.   Comments: Patient with chronic intermittent rectal bleeding. She had an episode of more significant rectal bleeding about 10 days ago which resolved spontaneously. She has had trouble with diarrhea and minimal bleeding and was scheduled for a colonoscopy but became sick while drinking golytly and started to pass more blood per rectum. She was admitted last night. Hemoglobin was normal on admission but has dropped some with hydration. WBC count is somewhat elevated but she is afebrile with no tachycardia. Vitals stable. Mild LLQ tenderness on palpation but no rebound.  Impression: ? Diverticular bleed. Other possibilities include bleeding from hemorrhoids, a neoplastic process or an infectious or inflammatory process.  Recommendations: IV levaquin. Follow H and H. I doubt that patient will be able to rolerate a colonoscopy and thererfore will perform a sigmoidoscopy today. Ischemic colitis is another consideration and flexible sigmoidoscopy should help. CT scn may be needed but will wait for creatinine to improve. If signs of significant active bleeding, will consider a bleeding scan. Plan discussed with patient and her son. Further recommendations to follow.  Electronic Signatures: Jill Side (MD)  (Signed 04-Mar-14 12:53)  Authored: Brief Consult Note   Last Updated: 04-Mar-14 12:53 by Jill Side (MD)

## 2015-02-22 NOTE — Consult Note (Signed)
Pt ostomy appears to be functioning OK.  She has some skin problems around the ostomy likely from irritation of the adhesive.  I recommend further visits from the ostomy nurse.  I do not normally take care of this problem.  I will sign off,  reconsult if any other GI problems  Electronic Signatures: Manya Silvas (MD)  (Signed on 01-Nov-14 13:06)  Authored  Last Updated: 01-Nov-14 13:06 by Manya Silvas (MD)

## 2015-02-22 NOTE — Consult Note (Signed)
Chief Complaint:  Subjective/Chief Complaint Please see colonoscopy report. Case discussed with Dr. Oliva Bustard. Consult placed for general surgery. Will follow.   Electronic Signatures: Jill Side (MD)  (Signed 06-Mar-14 17:26)  Authored: Chief Complaint   Last Updated: 06-Mar-14 17:26 by Jill Side (MD)

## 2015-02-22 NOTE — Consult Note (Signed)
Chief Complaint:  Subjective/Chief Complaint No further bleeding. Hemoglobin is slight lower than yesterday. No complaints.  Impression: Lower GI bleed. No signs of active bleeding.  Probable infectious or ischemic colitis of left colon. Diverticulosis. Large sigmoid polyp, removed. Rectal mass.  Recommendations: Appreciate consult by Dr. Oliva Bustard. Dr. Melvenia Needles opinion awaited. Follow up on biopsies, PET and CEA. Soft diet. Continue antibiotics. [facility] will follow over the weekend. Obtain stool studies if she develops diarrhea.   Electronic Signatures: Jill Side (MD)  (Signed 07-Mar-14 13:01)  Authored: Chief Complaint   Last Updated: 07-Mar-14 13:01 by Jill Side (MD)

## 2015-02-22 NOTE — Consult Note (Signed)
PATIENT NAME:  Emily [name], LIPSKY MR#:  [id number] DATE OF BIRTH:  1932/12/25  DATE OF CONSULTATION:  05/14/2013  REFERRING PHYSICIAN:   CONSULTING PHYSICIAN:  Britton Perkinson A. Christien Frankl, MD  REASON FOR CONSULTATION:  Epigastric pain x 3 to 4 days and nausea.   HISTORY OF PRESENT ILLNESS:  Emily [name] is a pleasant 81 year old female who has been seen by her oncologist and a surgical oncologist at [facility] for chemoradiation for a low rectal cancer complicated by C. diff. during her therapy who presents with 3 to 4 days of epigastric pain.  She is slightly confused when I see her due to the fact that I just awoken her, but according to her it sounds like that her pain has been going on intermittently for 3 to 4 days.  She says that it is epigastric and seems to go up into her chest and she feels like it is affecting her esophagus.  She also has had intermittent nausea and vomiting which she is unable to describe, but is not currently nauseated, currently has no pain.  No associated fevers, chills, night sweats, shortness of breath, cough, chest pain, current nausea, vomiting, diarrhea, constipation, dysuria or hematuria.   PAST MEDICAL HISTORY: 1.  Rectal cancer, status post chemoradiation diagnosed in March.  2.  History of diverticulosis.  3.  History of appendectomy.  4.  History of hysterectomy.  5.  Clostridium difficile.  HOME MEDICATIONS:   1.  Carafate 1 gram 4 times daily.  2.  Pyridium 100 mg by mouth twice daily.  3.  Phenergan 25 mg by mouth q. 6 hours as needed nausea.  4.  Zofran 4 mg by mouth q. 4 hours as needed nausea.    5.  Nystatin 5 mL 4 times daily swish and swallow.  6.  Nexium 40 mg by mouth daily.  7.  MiraLAX 17 mg by mouth daily as needed constipation.  8.  Metoprolol 12.5 by mouth q.a.m.  9.  Klor-Con 20 mEq q.a.m.  10.  Ferrous sulfate 325 by mouth daily.   ALLERGIES:  No known drug allergies.   SOCIAL HISTORY:  Denies alcohol use.   REVIEW OF SYSTEMS:  A 12  point review of systems was obtained.  Pertinent positives and negatives as above.   PHYSICAL EXAMINATION:  VITAL SIGNS:  Temperature 98.5, pulse 60, blood pressure 190/84, respirations 20, 95% on room air.  GENERAL:  No acute distress.  Alert and oriented x 3, but somewhat confused and apologize for her confusion.  HEAD:  Normocephalic, atraumatic.  EYES:  No scleral icterus.  No conjunctivitis.  FACE:  No obvious facial trauma.  Normal external nose.  Normal external ears.  CHEST:  Lungs clear to auscultation.  Moving air well.  HEART:  Regular rate and rhythm.  No murmurs, rubs or gallops.  ABDOMEN:  Soft, definitely nontender, nondistended.  EXTREMITIES:  Moves all extremities well.  Strength 5 out of 5.  NEUROLOGIC:  Cranial nerves II through XII grossly intact.  Sensation intact in all four extremities.   LABORATORY DATA:  White cell count of 8.3, hemoglobin 10.7, hematocrit 31.8, platelets 279.  Urinalysis is negative leukocyte esterase, negative nitrate.  LFTs are normal.  BMP is normal.   RADIOLOGIC DATA:  CT shows a distended gallbladder with a stone in the area of the gallbladder neck.   Ultrasound shows a gallbladder with a possible stone at gallbladder neck and in gallbladder.  Common bile duct is 8.5 mm.  Trace pericholecystic fluid.  ASSESSMENT AND PLAN:  Emily [name] is a pleasant 81 year old female with history of epigastric pain, nausea, vomiting and a gallbladder stone, possibly it is in the neck and trace pericholecystic fluid.  Clinically she does not have acute cholecystitis and her pain is currently resolved.  No need for emergent cholecystectomy.  We will continue to follow to see if she develops the discomfort that she was having prior to admission.  Would prefer to avoid a cholecystectomy as she is to have surgery in approximately two weeks and would prefer not to delay that surgery.    Appreciate oncology's recommendations.  We will continue to follow.     ____________________________ Glena Norfolk Anvita Hirata, MD cal:ea D: 05/14/2013 03:34:54 ET T: 05/14/2013 04:29:35 ET JOB#: [id number]  cc: Harrell Gave A. Kingjames Coury, MD, <Dictator> Floyde Parkins MD ELECTRONICALLY SIGNED 05/18/2013 17:55

## 2015-02-22 NOTE — Consult Note (Signed)
PATIENT NAME:  Emily [name], Emily [name] MR#:  [id number] DATE OF BIRTH:  September 07, 1933  DATE OF CONSULTATION:    REFERRING PHYSICIAN:   CONSULTING PHYSICIAN:  Manya Silvas, MD  PRIMARY CARE PHYSCIAN:  Dr. Lelon Huh.    ONCOLOGIST:  Dr. Oliva Bustard.   HISTORY OF PRESENT ILLNESS:  The patient is a 81 year old white female who was diagnosed recently with stage III rectal cancer who is on radiation and chemotherapy with a 5-FU pump.  She began feeling weak and fatigued, not eating and drinking.  In the ER, she was noted to have weakness and hematochezia with associated cramps, was having bloody stool and bloody diarrhea since chemotherapy.  She was admitted to the hospital.   PAST MEDICAL HISTORY:  She has stage III adenocarcinoma of the rectum on chemoradiation treatment.   PAST SURGICAL HISTORY:  Includes hysterectomy and appendectomy.   ALLERGIES:  No known drug allergies.   HABITS:  History of smoking, has cut way back.   FAMILY HISTORY:  Positive for kidney disease and breast cancer.   MEDICATIONS ON ADMISSION:   1.  HCTZ/triamterene 50/75 once a day.  2.  Iron sulfate 325 mg once a day.  3.  KCl 20 mEq once a day.  4.  Metoprolol 25 mg half tablet daily.  5.  Pantoprazole 40 mg a day.  5.  Zofran 4 mg q. 4 as needed.   REVIEW OF SYSTEMS:  The patient is weak and complaining of fatigue, not able to give a great review of systems.  She denies any shortness of breath or coughing.  No chest pains or irregular skipping heartbeats.  She has maybe 4 or 5 stools a day, some with blood.  There is no difficulty swallowing.  No hematemesis.   Since admission she has been started on Flagyl, a smaller than usual dose of oral and some IV as well.  Also started on Cipro.   Previous admission she had a colonoscopy by Dr. Dionne Milo, showed the mass and also showed inflamed areas, possibly ischemic or infectious colitis.  She apparently had a rough time with the prep.   PHYSICAL EXAMINATION: GENERAL:   White female looking older than her stated age, appearing kind of weak.  HEENT:  Sclerae are nonicteric.  Conjunctivae negative.  Tongue negative, slight coating on it.  NECK:  Shows a right carotid bruit.  CHEST:  Clear anterolateral fields.  HEART:  No murmurs or gallops I can hear.  ABDOMEN:  Not distended.  She is not obese.  Bowel sounds are present.  No hepatosplenomegaly.  No masses.  No bruits.  No peritoneal signs.  RECTAL:  Exam was not done at this time.  EXTREMITIES:  Showed no edema.  No cyanosis.   LABORATORY STUDIES:  On admission include sodium 135, potassium 3.4, chloride 99, CO2 27, glucose 148, BUN 34, calcium 10.6, albumin 3, total bilirubin 0.4, alkaline phosphatase 50, AST 15, ALT 11, troponin negative.  White count 2.5, hemoglobin 10.5.   Stool for Clostridium difficile came back positive.    RECOMMENDATIONS:  Vancomycin and/or Dificid are both more effective treatment for Clostridium difficile than Flagyl which is usually the first line of therapy because of cost.  Recommend starting vancomycin 250 mg by mouth 4 times daily.  If patient is intolerant of that, then I would switch to Dificid 200 mg by mouth twice daily.  She will need a 14 day course of this.  There is about a 20% relapse rate after the initial treatment,  probably 30% to 40% relapse after second or third treatment.  It normally takes 3 or 4 days for the infection to respond with a decrease in diarrhea.  She is on chemo and radiation and these may need to be held up from further treatments until her Clostridium difficile is successfully treated.     ____________________________ Manya Silvas, MD rte:ea D: 03/03/2013 62:95:28 ET T: 03/03/2013 07:02:27 ET JOB#: [id number]  cc: Manya Silvas, MD, <Dictator> Kirstie Peri. Caryn Section, MD [place] Oliva Bustard, MD Manya Silvas, MD  Manya Silvas MD ELECTRONICALLY SIGNED 03/15/2013 14:15

## 2015-02-22 NOTE — Consult Note (Signed)
PATIENT NAME:  Emily [name], TERWILLIGER MR#:  [id number] DATE OF BIRTH:  01/25/1933  DATE OF CONSULTATION:  09/02/2013  REFERRING PHYSICIAN:   CONSULTING PHYSICIAN:  Anam Bobby K. Henretta Quist, MD  AGE:  81 years.  SEX:  Female.  RACE: White.  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 The patient is widowed since 1996. The patient lives in a house by herself. The patient comes to [facility] inpatient with a chief complaint "trouble with leaking in my colostomy bag."  PAST PSYCHIATRIC HISTORY: No previous history of inpatient psychiatry. No history of suicide attempt. Not being followed by any psychiatrist.  ALCOHOL AND DRUGS: Denied. Does admit smoking nicotine cigarettes at a rate of half to one pack a day for many years.  MENTAL STATUS EXAMINATION: The patient was seen lying in bed, comfortable, alert and oriented, calm, pleasant and cooperative. No agitation. Affect is appropriate with her mood, which is low and down, but absolutely denies feeling depressed, and she stated that she just needs help, but probably she is getting adjusted to the new life, but not really depressed. Denies feeling hopeless or helpless. Denies feeling worthless or useless.  No psychosis.  Denies auditory or visual hallucinations, delusional or paranoid thinking. Memory is intact, cognition intact to generalized information. Denies any ideas or plans to hurt herself or others. Insight and judgment fair and adequate.  IMPRESSION:  Mood disorder, secondary to physical illness--colostomy bag secondary to colon cancer.  RECOMMENDATIONS: Start patient on Prozac 20 mg daily, which can be increased after a week to 40 mg if needed. The patient may need a few sessions of supportive therapy and learn coping skills and dealing with the stress of having to live with a colostomy bag, and social services is to be contacted to find out if there are any group sessions in the community where patients with colon  cancer can get help with the same.    ____________________________ Wallace Cullens. Franchot Mimes, MD skc:cg D: 09/02/2013 18:32:00 ET T: 09/02/2013 23:36:00 ET JOB#: [id number]  cc: Arlyn Leak K. Franchot Mimes, MD, <Dictator> Dewain Penning MD ELECTRONICALLY SIGNED 09/03/2013 18:39

## 2015-02-22 NOTE — Consult Note (Signed)
History of Present Illness:  Reason for Consult Dehydration Anemia Depression Cancer of rectum, adenocarcinoma T3, N1, M0 tumor on chemotherapy and radiation therapy   HPI   81 year old lady started having difficulty with nausea or vomiting over the weekend.  Did not eat.  Had some bleeding of the rectum.  Felt extremely weak tired and was brought to emergency room and was admitted in the hospital.was started on IV fluid.  Low-grade fever.  Persistent nausea but no vomiting.  Poor appetite.  This also is extremely depressed.  PFSH:  Additional Past Medical and Surgical History PAST MEDICAL HISTORY: Hypertension, hemorrhoids, diverticulosis.   PAST SURGICAL HISTORY: Appendectomy, hysterectomy.   PSYCHOSOCIAL HISTORY: Lives alone.  Smokes half pack a day. Denies any alcohol or illicit drug usage.   FAMILY HISTORY: Grandmother had breast cancer.  The patient's dad had end-stage renal disease.   Review of Systems:  General weakness  fatigue   Performance Status (ECOG) 1   HEENT no complaints   Lungs SOB   Cardiac no complaints   GI nausea  Bleeding fom rectum   Musculoskeletal no complaints   Extremities swelling   Skin no complaints   Neuro headache   Psych anxiety  sleep disturbance   NURSING NOTES: **Vital Signs.:   29-Apr-14 14:15   Vital Signs Type: Routine   Temperature Temperature (F): 97.7   Celsius: 36.5   Temperature Source: oral   Pulse Pulse: 70   Respirations Respirations: 20   Systolic BP Systolic BP: 536   Diastolic BP (mmHg) Diastolic BP (mmHg): 68   Mean BP: 81   Pulse Ox % Pulse Ox %: 97   Pulse Ox Activity Level: At rest   Oxygen Delivery: Room Air/ 21 %  *Intake and Output.:   29-Apr-14 12:07   Grand Totals: Intake: 120, Output: , Net: 120, 24 Hr.: 120   Oral Intake: In: 120   Percentage of Meal Eaten: 0   Physical Exam:  General Patient is alert oriented not very  communicating.  Lying in the bed.  Refuses to get out  of bed.  Does not want to eat too much.   HEENT: Alopecia.  Mucous membranes are dry   Lungs: clear  rales   Cardiac: Tachycardia   Abdomen: soft  nontender  positive bowel sounds   Skin: Poor skin turgor   Musculoskeletal: No abnormality   Extremities: edema  1+   Neuro: AAOx3  cranial nerves intact   Psych: mood agitated  depressed   Physical Exam LYMPHATICS:   No cervical, axillary, or inguinal lymphadenopathy     Cancer, Colon or Rectal: Rectal Cancer - dx this year 2014.   Hemorrhoids:    htn:    Denies medical history:    Appendectomy: May 2009   Hysterectomy - Partial:    No Known Allergies:     ondansetron 4 mg oral tablet: 1 tab(s) orally every 4 hours, As Needed, Status: Active, Quantity: 60, Refills: None   acetaminophen-HYDROcodone 325 mg-5 mg oral tablet: 1 tab(s) orally every 4 to 6 hours, As needed, pain, Status: Active, Quantity: 0, Refills: None   pantoprazole 40 mg oral delayed release tablet: 1 tab(s) orally once a day (at bedtime), Status: Active, Quantity: 30, Refills: None   Klor-Con M20 20 mEq oral tablet, extended release: 1 tab(s) orally once a day (in the morning), Status: Active, Quantity: 0, Refills: None   metoprolol succinate 25 mg oral tablet, extended release: 0.5 tab(s) orally once a day (in the  morning), Status: Active, Quantity: 0, Refills: None   ferrous sulfate 325 mg oral tablet: 1 tab(s) orally once a day (at bedtime), Status: Active, Quantity: 0, Refills: None   hydrochlorothiazide-triamterene 50 mg-75 mg oral tablet: 1 tab(s) orally once a day (in the morning), Status: Active, Quantity: 0, Refills: None  Laboratory Results: Routine Chem:  29-Apr-14 05:18   Glucose, Serum  106  BUN  29  Creatinine (comp) 1.27  Sodium, Serum 136  Potassium, Serum  3.1  Chloride, Serum 104  CO2, Serum 26  Calcium (Total), Serum 8.9  Anion Gap  6  Osmolality (calc) 278  eGFR (African American)  47  eGFR (Non-African American)  41  (eGFR values <26m/min/1.73 m2 may be an indication of chronic kidney disease (CKD). Calculated eGFR is useful in patients with stable renal function. The eGFR calculation will not be reliable in acutely ill patients when serum creatinine is changing rapidly. It is not useful in  patients on dialysis. The eGFR calculation may not be applicable to patients at the low and high extremes of body sizes, pregnant women, and vegetarians.)  Magnesium, Serum  1.3 (1.8-2.4 THERAPEUTIC RANGE: 4-7 mg/dL TOXIC: > 10 mg/dL  -----------------------)  Routine Hem:  29-Apr-14 05:18   WBC (CBC)  2.3  RBC (CBC)  2.78  Hemoglobin (CBC)  9.0  Hematocrit (CBC)  25.9  Platelet Count (CBC) 160  MCV 93  MCH 32.5  MCHC 34.9  RDW 12.9  Neutrophil % 64.9  Lymphocyte % 16.3  Monocyte % 14.0  Eosinophil % 4.7  Basophil % 0.1  Neutrophil # 1.5  Lymphocyte #  0.4  Monocyte # 0.3  Eosinophil # 0.1  Basophil # 0.0 (Result(s) reported on 28 Feb 2013 at 05:48AM.)   Assessment and Plan: Impression:   1. Adenocarcinoma of the rectum. Plan:   1. Adenocarcinoma of rectum. Clinical stage IIIB.  insufficiency due to dehydrationthe creatinine now 1.27 Her rectal bleeding due to rectal cancer has been stabilizedwe will start patient on Celexacarcinoma of rectum T3, N1, M0 tumor.  Will hold chemotherapy and radiation therapy for next 2 daystherapy and occupational therapyprophylaxisa prolonged discussion the patient's son and daughter-in-law. various  issues have been discussed.IV fluid,  ambulation.hemoglobin drops below   8 gms   will  transfuse  Electronic Signatures: Brain Honeycutt, JMartie Lee(MD)  (Signed 29-Apr-14 15:42)  Authored: HISTORY OF PRESENT ILLNESS, PFSH, ROS, NURSING NOTES, PE, PAST MEDICAL HISTORY, ALLERGIES, HOME MEDICATIONS, LABS, ASSESSMENT AND PLAN   Last Updated: 29-Apr-14 15:42 by CJobe Gibbon(MD)

## 2015-02-22 NOTE — Consult Note (Signed)
Reason for Visit: This 81 year old Female patient presents to the clinic for initial evaluation of  rectal cancer .   Referred by Dr. Oliva Bustard.  Diagnosis:  Chief Complaint/Diagnosis   81 year old female with clinical stage IIIB (T3, N1, M0) adenocarcinoma of the rectumarising in tubulovillous adenoma  Pathology Report pathology report reviewed   Imaging Report PET/CT scan and CT scans reviewed   Referral Report clinical notes reviewed   Planned Treatment Regimen preoperative chemoradiation followed by surgical resection   HPI   patient is a 81 year old female evaluated for rectal bleeding. She underwent colonoscopy was found to have a tubulo-adenoma at 20 cm. She also was noted to have a fungating mass at 6 cm biopsy positive for intramucosal adenocarcinoma arising in a tubulovillous adenoma. She was referred to [facility] where endoscopic ultrasound was performed showing a T3, N1 lesion.no further biopsies were performed. Recommendation at [facility] was made forpreoperative chemoradiation to be followed by surgical resection. She did have a PET/CT scanshowing intense avid uptake in the distal sigmoid and rectum consistent with known malignancy.she's been evaluated by medical oncology here and recommendation has been made for concurrent chemotherapy and radiation prior to surgery. She seen today for radiation collagen opinion. She is doing fairly well although was quite fatigued most likely from her anemia. She continues to have rectal bleeding. No significant change in caliber of her stools.she does have a significantly elevated CEA at 63.  Past Hx:    Hemorrhoids:    htn:    Denies medical history:    Appendectomy: May 2009   Hysterectomy - Partial:   Past, Family and Social History:  Past Medical History positive   Cardiovascular hypertension   Gastrointestinal hemorrhoids   Past Surgical History appendectomy; hysterectomy   Family History positive   Family History  Comments grandmother with breast cancer, father with end-stage renal disease   Social History positive   Social History Comments 40-pack-year smoking history continues to smoke half a pack a day.   Additional Past Medical and Surgical History seen accompanied by son and daughter-in-law today   Allergies:   No Known Allergies:   Home Meds:  Home Medications: Medication Instructions Status  FeroSul 325 mg oral tablet 1 tab(s) orally 3 times a day Active  pantoprazole 40 mg oral delayed release tablet 1 tab(s) orally once a day Active  metoprolol succinate 25 mg oral tablet, extended release 0.5 tab(s) orally once a day Active  potassium chloride 1 tab(s) orally once a day Active   Review of Systems:  General negative   Performance Status (ECOG) 0   Skin negative   Breast negative   Ophthalmologic negative   ENMT negative   Respiratory and Thorax negative   Cardiovascular negative   Gastrointestinal see HPI   Genitourinary negative   Musculoskeletal negative   Neurological negative   Psychiatric negative   Hematology/Lymphatics negative   Endocrine negative   Allergic/Immunologic negative   Review of Systems   except for fatigue and significant hematochezia according to nurse's notes Patient denies any weight loss, fatigue, weakness, fever, chills or night sweats. Patient denies any loss of vision, blurred vision. Patient denies any ringing  of the ears or hearing loss. No irregular heartbeat. Patient denies heart murmur or history of fainting. Patient denies any chest pain or pain radiating to her upper extremities. Patient denies any shortness of breath, difficulty breathing at night, cough or hemoptysis. Patient denies any swelling in the lower legs. Patient denies any nausea vomiting,  vomiting of blood, or coffee ground material in the vomitus. Patient denies any stomach pain. Patient states has had normal bowel movements no significant constipation or diarrhea.  Patient denies any dysuria, hematuria or significant nocturia. Patient denies any problems walking, swelling in the joints or loss of balance. Patient denies any skin changes, loss of hair or loss of weight. Patient denies any excessive worrying or anxiety or significant depression. Patient denies any problems with insomnia. Patient denies excessive thirst, polyuria, polydipsia. Patient denies any swollen glands, patient denies easy bruising or easy bleeding. Patient denies any recent infections, allergies or URI. Patient "s visual fields have not changed significantly in recent time.  Nursing Notes:  Nursing Vital Signs and Chemo Nursing Nursing Notes: *CC Vital Signs Flowsheet:   03-Apr-14 15:05  Temp Temperature 97.6  Pulse Pulse 81  Respirations Respirations 18  SBP SBP 131  DBP DBP 72  Pain Scale (0-10)  0  Height (cm) centimeters 165   Physical Exam:  General/Skin/HEENT:  General normal   Skin normal   Eyes normal   ENMT normal   Head and Neck normal   Additional PE well-developed elderly female in NAD. No cervical or supraclavicular adenopathy is appreciated. Lungs are clear to A&P cardiac examination shows regular rate and rhythm. Abdomen is benign with no organomegaly or masses noted. No inguinal adenopathy is appreciated. Rectal exam is deferred today.   Breasts/Resp/CV/GI/GU:  Respiratory and Thorax normal   Cardiovascular normal   Gastrointestinal normal   Genitourinary normal   MS/Neuro/Psych/Lymph:  Musculoskeletal normal   Neurological normal   Lymphatics normal   Other Results:  Radiology Results: LabUnknown:    10-Mar-14 11:51, PET/CT Scan Colorectal Cancer Initial Stage  PACS Image   Nuclear Med:  PET/CT Scan Colorectal Cancer Initial Stage   REASON FOR EXAM:    Colorectal Cancer Staging  COMMENTS:   LMP: Post-Menopausal    PROCEDURE: PET - PET/CT INIT STAGING COLORECTAL  - Jan 09 2013 11:51AM     RESULT: The patient is undergoing initial  staging of colorectal   malignancy. The patient's fasting blood glucose level is 88 mg/dL. The   patient received 12.12 mCi of F-18 labeled FDG at 9:49 a.m. with scanning   beginning at 10:56 a.m. A noncontrast CT scan was performed for   coregistration and attenuation correction.    Uptake of the radiopharmaceutical within the neck is normal. A small   amount of laryngeal activity is demonstrated likely due to phonation.   Within the thorax no abnormal uptake is demonstrated. Within the abdomen   and pelvis normal expected urinary tract activityis demonstrated.     Intensely increased uptake is demonstrated within the distal sigmoid and   rectum. The maximal SUV is 12.3 with a mean of 18. No abnormal uptake is   demonstrated in the pelvic sidewall. Elsewhere no abnormal bowel uptake   is demonstrated. There is no abnormal skeletal uptake.    On the CT images there is atelectasis versus infiltrate in the   costophrenic gutters bilaterally but most conspicuously on the left.   There is a small pericardial effusion the cardiac chambers areborderline   enlarged. There is dense calcification in the wall of the thoracic aorta.   Within the abdomen there are no adrenal masses. The liver exhibits no   focal mass. There is no evidence of obstruction of either kidney. No   gallstones are evident. The caliber of the abdominal aorta is normal.    IMPRESSION:  1. There is intensely increased uptake within the distal sigmoid and     rectum consistent with known malignancy. I do not see abnormal uptake   elsewhere.  2. There is bibasilar atelectasis/infiltrate and small amounts of pleural   fluid.  3. There is a small pericardial effusion.     Dictation Site: 1        Verified By: DAVID A. Martinique, M.D., MD   Relevent Results:   Relevant Scans and Labs PET/CT scan is reviewed and compatible with the above-stated findings   Assessment and Plan: Impression:   clinical stage IIIB  adenocarcinoma of the rectum in 81 year old female Plan:   interesting case based on the initial pathologic review believe this is much further locally advanced disease than initial biopsy would suggest. Based on the PET/CT findings, large mass, and endoscopic ultrasound believe patient would benefit from preoperative chemotherapy radiation. Would plan on delivering 4500 cGy to her whole pelvis followed by up to 2 boosts to bring the total tumor dose to approximately 5400 cGy over 6 weeks using three-dimensional treatment planning. This will be done in conjunction with concurrent chemotherapy. I have set the patient up for CT simulation early next week. Risks and benefits of treatment including exacerbation of diarrhea, possible increase in urinary frequency and urgency, fatigue, minor skin reaction, all were explained in detail to the patient and her family. They all seem to comprehend my treatment plan well.  I would like to take this opportunity to thank you for allowing me to continue to participate in this patient's care.  CC Referral:  cc: Dr. Burnett Harry, Dr.Mantyh [employer]   Electronic Signatures: Baruch Gouty Roda Shutters (MD)  (Signed 04-Apr-14 11:08)  Authored: HPI, Diagnosis, Past Hx, PFSH, Allergies, Home Meds, ROS, Nursing Notes, Physical Exam, Other Results, Relevent Results, Encounter Assessment and Plan, CC Referring Physician   Last Updated: 04-Apr-14 11:08 by Armstead Peaks (MD)

## 2015-02-22 NOTE — Discharge Summary (Signed)
Dates of Admission and Diagnosis:  Date of Admission 27-Feb-2013   Date of Discharge 10-Mar-2013   Admitting Diagnosis Diarrhea.  Rectal bleeding   Final Diagnosis Clostridium difficile colitis   Discharge Diagnosis 1 Dehydration   2 Rectal bleeding and anemia   3 malnutrition secondary to poor overall intake and malignancy   4 Anemia secondary to rectal bleeding   5 Electrolyte imbalance with hypomagnesemia and hypokalemia   6 Abdominal pain due to colitis   7 Depression   8 Carcinoma of rectum locally advanced T3, N1, M0 tumor responding by CEA criteria    Chief Complaint/History of Present Illness HISTORY OF PRESENT ILLNESS: The patient is a 81 year old Caucasian female with a recent diagnosis of stage III rectal cancer on chemotherapy as well as radiation therapy with 5-fluorouracil pump. Has been not feeling well for the past few days following chemotherapy. The patient usually will be started on 5-fluorouracil pump from Monday through Friday, and the pump will be disconnected on Friday as reported by the patient's family. As the patient is supposed to restart her chemotherapy today via 5-fluorouracil pump, she was unable to go to Dr. Oliva Bustard???s office as scheduled. She was feeling weak and tired and has been not eating or drinking well for the past few days. The patient's son has called Dr. Oliva Bustard who has recommended to send the patient to the ER for hydration. During my examination, the patient is complaining of extreme weakness and hematochezia associated with abdominal cramps. She has reported that she has been persistently having bloody diarrhea since the chemotherapy which was anticipated before. The patient denies any chest pain or shortness of breath. No fevers either. She has no appetite and has been not eating or drinking for the past few days. Denies any dizziness or loss of consciousness.     Oncology:  06-May-14 03:53   Carcinoembryonic Antigen (CEA)  19.5 (Roche  ECLIA methodology       Nonsmokers  <3.9                                                     Smokers     <5.6            [facility]            No: [id number]           [address], [place], [place] [address]           Lindon Romp, MD         [phone] Result(s) reported on 08 Mar 2013 at 02:49PM.)  Routine Chem:  06-May-14 03:53   Magnesium, Serum  0.9 (1.8-2.4 THERAPEUTIC RANGE: 4-7 mg/dL TOXIC: > 10 mg/dL  -----------------------)  Glucose, Serum  101  BUN  6  Creatinine (comp) 0.94  Sodium, Serum 139  Potassium, Serum  3.1  Chloride, Serum  109  CO2, Serum 25  Calcium (Total), Serum  7.4  Osmolality (calc) 275  eGFR (African American) >60  eGFR (Non-African American)  59 (eGFR values <64mL/min/1.73 m2 may be an indication of chronic kidney disease (CKD). Calculated eGFR is useful in patients with stable renal function. The eGFR calculation will not be reliable in acutely ill patients when serum creatinine is changing rapidly. It is not useful in  patients on dialysis. The eGFR calculation may not be applicable to  patients at the low and high extremes of body sizes, pregnant women, and vegetarians.)  Result Comment LABS - This specimen was collected through an   - indwelling catheter or arterial line.  - A minimum of of blood was wasted prior    - to collecting the sample.  Interpret  - results with caution.  Result(s) reported on 07 Mar 2013 at 05:52AM.  Anion Gap  5  Routine Hem:  06-May-14 03:53   WBC (CBC) 9.1  RBC (CBC)  2.58  Hemoglobin (CBC)  8.2  Hematocrit (CBC)  23.7  Platelet Count (CBC) 198  MCV 92  MCH 31.8  MCHC 34.7  RDW 14.1  Bands 4  Segmented Neutrophils 81  Lymphocytes 3  Monocytes 8  Basophil 1  Metamyelocyte 2  Diff Comment 1 PLTS VARIED IN SIZE  Diff Comment 2 ANISOCYTOSIS  Myelocyte 1  Diff Comment 3 POLYCHROMASIA  Diff Comment 4 DOHLE BODIES  Diff Comment 5 TOXIC GRANULATION  Result(s) reported on 07 Mar 2013 at 05:52AM.   PERTINENT RADIOLOGY STUDIES: XRay:    10-Apr-14 10:41, Chest Portable Single View  Chest Portable Single View   REASON FOR EXAM:    PACU BED 2, power port placement. Please complete   erect. Thanks.  COMMENTS:       PROCEDURE: DXR - DXR PORTABLE CHEST SINGLE VIEW  - Feb 09 2013 10:41AM     RESULT: History: Port placement.    Comparison Study: Prior chest x-ray72,013.     Findings: Port-A-Cath noted with tip projecting over superior vena cava.   Lungs clear. No evidence of pneumothorax. Heart size normal.    IMPRESSION:  Good anatomic positioning of Port-A-Cath.    Verified By: Gwynn Burly, M.D., MD  LabUnknown:  PACS Image    Hospital Course:  Hospital Course During hospital stay patient had stool evaluate bed which was positive for Clostridium difficile toxin.  Initially was patient started on IV Flagyl r and changed to vancomycin by mouth. Patient was found to be depressed started on Celexa Physical therapy was started DVT prophylaxis was started. Diarrhea and abdominal pain got better.  Patient made some progress with physiotherapy.  Also was treated with Diflucan for fungal infection. Chemotherapy and radiation therapy was put on hold .  Oral intake gradually increased. Patient is now being discharged to a skilled nursing facility for rehabilitation therapy   Condition on Discharge Fair   DISCHARGE INSTRUCTIONS HOME MEDS:  Medication Reconciliation: Patient's Home Medications at Discharge:     Medication Instructions  ondansetron 4 mg oral tablet  1 tab(s) orally every 4 hours, As Needed   klor-con m20 20 meq oral tablet, extended release  1 tab(s) orally once a day (in the morning)   metoprolol succinate 25 mg oral tablet, extended release  0.5 tab(s) orally once a day (in the morning)   ferrous sulfate 325 mg oral tablet  1 tab(s) orally once a day (at bedtime)   hydrochlorothiazide-triamterene 50 mg-75 mg oral tablet  1 tab(s) orally  once a day (in the morning)   pantoprazole 40 mg oral delayed release tablet  1 tab(s) orally once a day (at bedtime)   acetaminophen-hydrocodone 325 mg-5 mg oral tablet  1 tab(s) orally every 4 to 6 hours, As needed, pain   vancomycin 250 mg/5 ml oral solution  5 milliliter(s) orally every 6 hours x 10 days   lactobacillus acidophilus  1 cap(s) orally 3 times a day x 10 days  magnesium oxide 400 mg oral tablet  1 tab(s) orally once a day x 5 days     Physician's Instructions:  Home Health? No   Treatments None   Home Oxygen? No   Diet Regular   Dietary Supplements Ensure   Dietary Supplements Frequency Three times per day   Diet Consistency Regular Consistency   Activity Limitations As tolerated  physiotherapy    Referrals None   Return to Work Not Applicable   Time frame for Follow Up Appointment See Dr. Donella Stade in 10 days   Other Comments See Dr. Oliva Bustard in  2 weeks   Electronic Signatures: Jobe Gibbon (MD)  (Signed [phone] 08:42)  Authored: ADMISSION DATE AND DIAGNOSIS, CHIEF COMPLAINT/HPI, PERTINENT LABS, [place], PATIENT INSTRUCTIONS   Last Updated: 09-May-14 08:42 by Jobe Gibbon (MD)

## 2015-05-02 ENCOUNTER — Ambulatory Visit
Admission: RE | Admit: 2015-05-02 | Discharge: 2015-05-02 | Disposition: A | Discharge status: Home / Self Care | Payer: Medicare Other | Source: Ambulatory Visit | Attending: Oncology | Admitting: Oncology

## 2015-05-02 ENCOUNTER — Other Ambulatory Visit: Payer: Self-pay | Admitting: Oncology

## 2015-05-02 DIAGNOSIS — Z1231 Encounter for screening mammogram for malignant neoplasm of breast: Secondary | ICD-10-CM | POA: Diagnosis not present

## 2015-05-02 HISTORY — DX: Personal history of antineoplastic chemotherapy: Z92.21

## 2015-05-02 HISTORY — DX: Personal history of irradiation: Z92.3

## 2015-05-07 ENCOUNTER — Other Ambulatory Visit: Payer: Self-pay | Admitting: Family Medicine

## 2015-05-07 DIAGNOSIS — I10 Essential (primary) hypertension: Secondary | ICD-10-CM

## 2015-05-07 NOTE — Telephone Encounter (Signed)
This is  [facility] patient. Pharmacy request refill ([facility] [address]). Last office visit was 07/28/2013.

## 2015-05-09 DIAGNOSIS — M8580 Other specified disorders of bone density and structure, unspecified site: Secondary | ICD-10-CM | POA: Insufficient documentation

## 2015-05-09 DIAGNOSIS — R5383 Other fatigue: Secondary | ICD-10-CM | POA: Insufficient documentation

## 2015-05-09 DIAGNOSIS — E559 Vitamin D deficiency, unspecified: Secondary | ICD-10-CM | POA: Insufficient documentation

## 2015-05-09 DIAGNOSIS — Z85038 Personal history of other malignant neoplasm of large intestine: Secondary | ICD-10-CM | POA: Insufficient documentation

## 2015-05-09 DIAGNOSIS — K5790 Diverticulosis of intestine, part unspecified, without perforation or abscess without bleeding: Secondary | ICD-10-CM | POA: Insufficient documentation

## 2015-05-09 DIAGNOSIS — K649 Unspecified hemorrhoids: Secondary | ICD-10-CM | POA: Insufficient documentation

## 2015-05-09 DIAGNOSIS — R634 Abnormal weight loss: Secondary | ICD-10-CM | POA: Insufficient documentation

## 2015-05-09 DIAGNOSIS — Z720 Tobacco use: Secondary | ICD-10-CM | POA: Insufficient documentation

## 2015-05-09 DIAGNOSIS — R8290 Unspecified abnormal findings in urine: Secondary | ICD-10-CM | POA: Insufficient documentation

## 2015-05-09 DIAGNOSIS — I10 Essential (primary) hypertension: Secondary | ICD-10-CM | POA: Insufficient documentation

## 2015-05-09 DIAGNOSIS — L989 Disorder of the skin and subcutaneous tissue, unspecified: Secondary | ICD-10-CM | POA: Insufficient documentation

## 2015-05-09 DIAGNOSIS — G4700 Insomnia, unspecified: Secondary | ICD-10-CM | POA: Insufficient documentation

## 2015-05-13 ENCOUNTER — Ambulatory Visit (INDEPENDENT_AMBULATORY_CARE_PROVIDER_SITE_OTHER): Payer: Medicare Other | Admitting: Family Medicine

## 2015-05-13 ENCOUNTER — Ambulatory Visit
Admission: RE | Admit: 2015-05-13 | Discharge: 2015-05-13 | Disposition: A | Discharge status: Home / Self Care | Payer: Medicare Other | Source: Ambulatory Visit | Attending: Family Medicine | Admitting: Family Medicine

## 2015-05-13 ENCOUNTER — Encounter: Payer: Self-pay | Admitting: Family Medicine

## 2015-05-13 VITALS — BP 132/74 | HR 96 | Temp 97.7°F | Resp 16 | Ht 64.0 in | Wt 127.0 lb

## 2015-05-13 DIAGNOSIS — Z720 Tobacco use: Secondary | ICD-10-CM | POA: Diagnosis not present

## 2015-05-13 DIAGNOSIS — Z85038 Personal history of other malignant neoplasm of large intestine: Secondary | ICD-10-CM

## 2015-05-13 DIAGNOSIS — I517 Cardiomegaly: Secondary | ICD-10-CM | POA: Diagnosis not present

## 2015-05-13 DIAGNOSIS — I10 Essential (primary) hypertension: Secondary | ICD-10-CM

## 2015-05-13 DIAGNOSIS — M2550 Pain in unspecified joint: Secondary | ICD-10-CM | POA: Diagnosis not present

## 2015-05-13 DIAGNOSIS — R05 Cough: Secondary | ICD-10-CM | POA: Insufficient documentation

## 2015-05-13 DIAGNOSIS — E559 Vitamin D deficiency, unspecified: Secondary | ICD-10-CM

## 2015-05-13 DIAGNOSIS — R0609 Other forms of dyspnea: Secondary | ICD-10-CM

## 2015-05-13 DIAGNOSIS — R0602 Shortness of breath: Secondary | ICD-10-CM | POA: Diagnosis present

## 2015-05-13 NOTE — Progress Notes (Signed)
Patient: Emily [name] Female    DOB: 27-Oct-1933   81 y.o.   MRN: [id number] Visit Date: 05/13/2015  Today's Provider: Lelon Huh, MD   Chief Complaint  Patient presents with  . Hypertension  . Joint Pain   Subjective:    HPI  Hypertension, follow-up:  BP Readings from Last 3 Encounters:  05/13/15 132/74  07/28/13 124/60    She was last seen for hypertension 2 years ago.  BP at that visit was 124/60. Management changes since that visit include D/C Amlodipine. She reports fair compliance with treatment. (Pt has stopped taking Maxzide, but is taking Metoprolol.) She is not having side effects.  She is exercising. She is adherent to low salt diet.   Outside blood pressures are not being checked. She is experiencing chest pain, dyspnea and fatigue.  Patient denies claudication, lower extremity edema, near-syncope, orthopnea, palpitations, syncope and tachypnea.   Cardiovascular risk factors include advanced age (older than 71 for men, 19 for women), hypertension and smoking/ tobacco exposure.  Use of agents associated with hypertension: none.     Weight trend: stable Wt Readings from Last 3 Encounters:  05/13/15 127 lb (57.607 kg)  07/28/13 108 lb (48.988 kg)    Current diet: on average, 2 meals per day  ------------------------------------------------------------------------     Joint/Muscle Pain:  Patient complains of arthralgias for which has been present for several months. Pain is located in the right MCP(s): 4th and 5th, is described as aching, and is intermittent .  Associated symptoms include: crepitation and edema.  The patient has tried nothing for pain relief.  Related to injury:   no.  Follow up colon cancer: Was last seen by Dr. Paulino Door in June 2015 and reports that no follow up was arranged at that time. Has not had follow up colonoscopy  Allergies  Allergen Reactions  . Amlodipine Besylate Other (See Comments)  . Oxycodone    confusion   Previous Medications   METOPROLOL SUCCINATE (TOPROL-XL) 25 MG 24 HR TABLET    Take 0.5 tablets by mouth daily.   MULTIPLE VITAMINS-MINERALS (MULTIVITAMIN ADULT PO)    Take 1 tablet by mouth daily.   MULTIPLE VITAMINS-MINERALS (MULTIVITAMIN ADULT PO)    Take by mouth.    Review of Systems  Constitutional: Positive for fatigue. Negative for activity change and appetite change.  Respiratory: Positive for shortness of breath.   Cardiovascular: Positive for chest pain. Negative for palpitations and leg swelling.  Musculoskeletal: Positive for arthralgias.    History  Substance Use Topics  . Smoking status: Current Every Day Smoker -- 0.75 packs/day for 64 years    Types: Cigarettes  . Smokeless tobacco: Never Used  . Alcohol Use: 0.0 oz/week    0 Standard drinks or equivalent per week     Comment: rarely   Objective:   BP 132/74 mmHg  Pulse 96  Temp(Src) 97.7 F (36.5 C) (Oral)  Resp 16  Ht 5\' 4"  (1.626 m)  Wt 127 lb (57.607 kg)  BMI 21.79 kg/m2  Physical Exam   General Appearance:    Alert, cooperative, no distress  Eyes:    PERRL, conjunctiva/corneas clear, EOM's intact       Lungs:     Clear to auscultation bilaterally, respirations unlabored  Heart:    Regular rate and rhythm  Neurologic:   Awake, alert, oriented x 3. No apparent focal neurological           defect.  Assessment & Plan:     1. Essential hypertension well controlled Continue current medications.   - Renal function panel  2. Vitamin D deficiency  - Vit D  25 hydroxy (rtn osteoporosis monitoring)  3. Arthralgia   4. Dyspnea on exertion  - CBC - DG Chest 2 View; Future  5. History of colon cancer  - Ambulatory referral to Gastroenterology  6. Tobacco abuse Encourage to continue working on smoking cessation. She does not want to try any prescription medications.     Lelon Huh, MD  [facility]

## 2015-05-13 NOTE — Patient Instructions (Signed)
Please check with your pharmacist and see if you have had the Prevnar-13 vaccine. If not, you should have one.

## 2015-05-14 ENCOUNTER — Telehealth: Payer: Self-pay | Admitting: *Deleted

## 2015-05-14 ENCOUNTER — Telehealth: Payer: Self-pay

## 2015-05-14 LAB — RENAL FUNCTION PANEL
Albumin: 4.3 g/dL (ref 3.5–4.8)
BUN / CREAT RATIO: 20 (ref 11–26)
BUN: 24 mg/dL (ref 8–27)
CHLORIDE: 104 mmol/L (ref 97–108)
CO2: 25 mmol/L (ref 18–29)
Calcium: 10.4 mg/dL — ABNORMAL HIGH (ref 8.7–10.3)
Creatinine, Ser: 1.21 mg/dL — ABNORMAL HIGH (ref 0.57–1.00)
GFR calc Af Amer: 49 mL/min/{1.73_m2} — ABNORMAL LOW (ref >59)
GFR calc non Af Amer: 43 mL/min/{1.73_m2} — ABNORMAL LOW (ref >59)
Glucose: 107 mg/dL — ABNORMAL HIGH (ref 65–99)
POTASSIUM: 4.6 mmol/L (ref 3.5–5.2)
Phosphorus: 3 mg/dL (ref 2.5–4.5)
SODIUM: 144 mmol/L (ref 134–144)

## 2015-05-14 LAB — CBC
HEMOGLOBIN: 14.7 g/dL (ref 11.1–15.9)
Hematocrit: 43.6 % (ref 34.0–46.6)
MCH: 31.5 pg (ref 26.6–33.0)
MCHC: 33.7 g/dL (ref 31.5–35.7)
MCV: 94 fL (ref 79–97)
PLATELETS: 255 10*3/uL (ref 150–379)
RBC: 4.66 x10E6/uL (ref 3.77–5.28)
RDW: 13.9 % (ref 12.3–15.4)
WBC: 7.1 10*3/uL (ref 3.4–10.8)

## 2015-05-14 LAB — VITAMIN D 25 HYDROXY (VIT D DEFICIENCY, FRACTURES): VIT D 25 HYDROXY: 29.4 ng/mL — AB (ref 30.0–100.0)

## 2015-05-14 MED ORDER — LEVOFLOXACIN 500 MG PO TABS: 500.0000 mg | ORAL_TABLET | Freq: Every day | ORAL | Status: DC

## 2015-05-14 NOTE — Telephone Encounter (Signed)
-----   Message from [name], MD sent at 05/14/2015  8:22 AM EDT ----- Chest xr shows area in right lower lung suggestive of early pneumonia. Need to start levoquin 500mg  one a day for 10 days. Need to repeat chest XR in 3 weeks.

## 2015-05-14 NOTE — Telephone Encounter (Signed)
Pt advised as directed below.   Thanks,   -Rida Loudin  

## 2015-05-14 NOTE — Telephone Encounter (Signed)
-----   Message from [name], MD sent at 05/14/2015  7:58 AM EDT ----- Labs are good. Kidney functions are stable. Normal blood count, sugar and electrolytes. Continue current medications.

## 2015-05-14 NOTE — Telephone Encounter (Signed)
Rx sent in to pharmacy. 

## 2015-05-17 ENCOUNTER — Other Ambulatory Visit: Payer: Self-pay | Admitting: *Deleted

## 2015-05-17 DIAGNOSIS — Z85038 Personal history of other malignant neoplasm of large intestine: Secondary | ICD-10-CM

## 2015-05-20 ENCOUNTER — Inpatient Hospital Stay: Payer: Medicare Other | Attending: Oncology | Admitting: Oncology

## 2015-05-20 ENCOUNTER — Inpatient Hospital Stay: Payer: Medicare Other

## 2015-05-20 ENCOUNTER — Encounter: Payer: Self-pay | Admitting: Oncology

## 2015-05-20 VITALS — BP 154/74 | HR 87 | Temp 97.7°F | Resp 20 | Ht 64.0 in | Wt 126.0 lb

## 2015-05-20 DIAGNOSIS — I10 Essential (primary) hypertension: Secondary | ICD-10-CM | POA: Diagnosis not present

## 2015-05-20 DIAGNOSIS — Z923 Personal history of irradiation: Secondary | ICD-10-CM | POA: Insufficient documentation

## 2015-05-20 DIAGNOSIS — F17210 Nicotine dependence, cigarettes, uncomplicated: Secondary | ICD-10-CM | POA: Diagnosis not present

## 2015-05-20 DIAGNOSIS — C20 Malignant neoplasm of rectum: Secondary | ICD-10-CM

## 2015-05-20 DIAGNOSIS — M8580 Other specified disorders of bone density and structure, unspecified site: Secondary | ICD-10-CM | POA: Diagnosis not present

## 2015-05-20 DIAGNOSIS — Z85048 Personal history of other malignant neoplasm of rectum, rectosigmoid junction, and anus: Secondary | ICD-10-CM | POA: Insufficient documentation

## 2015-05-20 DIAGNOSIS — M129 Arthropathy, unspecified: Secondary | ICD-10-CM | POA: Diagnosis not present

## 2015-05-20 DIAGNOSIS — K5790 Diverticulosis of intestine, part unspecified, without perforation or abscess without bleeding: Secondary | ICD-10-CM | POA: Insufficient documentation

## 2015-05-20 DIAGNOSIS — Z9221 Personal history of antineoplastic chemotherapy: Secondary | ICD-10-CM | POA: Diagnosis not present

## 2015-05-20 DIAGNOSIS — Z8619 Personal history of other infectious and parasitic diseases: Secondary | ICD-10-CM | POA: Insufficient documentation

## 2015-05-20 DIAGNOSIS — Z85038 Personal history of other malignant neoplasm of large intestine: Secondary | ICD-10-CM

## 2015-05-20 DIAGNOSIS — Z803 Family history of malignant neoplasm of breast: Secondary | ICD-10-CM | POA: Insufficient documentation

## 2015-05-20 LAB — COMPREHENSIVE METABOLIC PANEL
ALK PHOS: 62 U/L (ref 38–126)
ALT: 12 U/L — AB (ref 14–54)
ANION GAP: 7 (ref 5–15)
AST: 20 U/L (ref 15–41)
Albumin: 4.1 g/dL (ref 3.5–5.0)
BUN: 22 mg/dL — ABNORMAL HIGH (ref 6–20)
CALCIUM: 8.9 mg/dL (ref 8.9–10.3)
CHLORIDE: 105 mmol/L (ref 101–111)
CO2: 25 mmol/L (ref 22–32)
Creatinine, Ser: 1.27 mg/dL — ABNORMAL HIGH (ref 0.44–1.00)
GFR calc Af Amer: 45 mL/min — ABNORMAL LOW (ref >60)
GFR calc non Af Amer: 39 mL/min — ABNORMAL LOW (ref >60)
Glucose, Bld: 110 mg/dL — ABNORMAL HIGH (ref 65–99)
POTASSIUM: 3.7 mmol/L (ref 3.5–5.1)
Sodium: 137 mmol/L (ref 135–145)
Total Bilirubin: 0.7 mg/dL (ref 0.3–1.2)
Total Protein: 7.2 g/dL (ref 6.5–8.1)

## 2015-05-20 LAB — CBC WITH DIFFERENTIAL/PLATELET
BASOS ABS: 0 10*3/uL (ref 0–0.1)
Basophils Relative: 0 %
Eosinophils Absolute: 0.1 10*3/uL (ref 0–0.7)
Eosinophils Relative: 1 %
HEMATOCRIT: 42.3 % (ref 35.0–47.0)
Hemoglobin: 13.8 g/dL (ref 12.0–16.0)
LYMPHS ABS: 1.1 10*3/uL (ref 1.0–3.6)
Lymphocytes Relative: 19 %
MCH: 31 pg (ref 26.0–34.0)
MCHC: 32.6 g/dL (ref 32.0–36.0)
MCV: 94.9 fL (ref 80.0–100.0)
Monocytes Absolute: 0.5 10*3/uL (ref 0.2–0.9)
Monocytes Relative: 10 %
NEUTROS ABS: 3.9 10*3/uL (ref 1.4–6.5)
NEUTROS PCT: 70 %
Platelets: 189 10*3/uL (ref 150–440)
RBC: 4.46 MIL/uL (ref 3.80–5.20)
RDW: 14.3 % (ref 11.5–14.5)
WBC: 5.6 10*3/uL (ref 3.6–11.0)

## 2015-05-20 NOTE — Progress Notes (Signed)
Patient currently smokes 3/4 PPD and has smoked for >60 years. Patient states she has a living will

## 2015-05-21 LAB — CEA: CEA: 4.9 ng/mL — ABNORMAL HIGH (ref 0.0–4.7)

## 2015-05-26 ENCOUNTER — Encounter: Payer: Self-pay | Admitting: Oncology

## 2015-05-26 NOTE — Progress Notes (Signed)
[place] @ [facility] Telephone:([phone]) [phone]  Fax:([phone]) [place]: Aug 25, 1933  MR#: [id number]  LDJ#:[id number]  Patient Care Team: [name], MD as PCP - General (Family Medicine) [name], MD (General Surgery) [name], MD (Unknown Physician Specialty) [name], MD (Family Medicine) [name], MD (General Surgery)  CHIEF COMPLAINT:  Chief Complaint  Patient presents with  . Follow-up    Rectal cancer    Oncology History   rectal cancer T3, N1, M0 tumor on chemoradiation therapy Hospitalizations secondary to diarrhea(April, 2014) chemoradiation intrupted. C. difficile diarrhea. 2.Status post abdominal perineal resection pT3  pNO stage II  disease.  (As September, 2014) patient underwent surgery at [facility] 3.  Patient could not tolerate post operative chemotherapy FOLFOX     Rectal cancer   01/20/2013 Initial Diagnosis Rectal cancer   [age]-year-old lady with a history of rectal cancer INTERVAL HISTORY: Patient came for follow-up.  No rectal bleeding or abdominal pain no nausea no vomiting no diarrhea.  Patient continues to smoke.  Last colonoscopy was a few years ago and new one is being scheduled.  REVIEW OF SYSTEMS:   GENERAL:  Feels good.  Active.  No fevers, sweats or weight loss. PERFORMANCE STATUS (ECOG)01 HEENT:  No visual changes, runny nose, sore throat, mouth sores or tenderness. Lungs: No shortness of breath or cough.  No hemoptysis. Cardiac:  No chest pain, palpitations, orthopnea, or PND. GI:  No nausea, vomiting, diarrhea, constipation, melena or hematochezia. GU:  No urgency, frequency, dysuria, or hematuria. Musculoskeletal:  No back pain.  No joint pain.  No muscle tenderness. Extremities:  No pain or swelling. Skin:  No rashes or skin changes. Neuro:  No headache, numbness or weakness, balance or coordination issues. Endocrine:  No diabetes, thyroid issues, hot flashes or night  sweats. Psych:  No mood changes, depression or anxiety. Pain:  No focal pain. Review of systems:  All other systems reviewed and found to be negative. As per HPI. Otherwise, a complete review of systems is negatve.  PAST MEDICAL HISTORY: Past Medical History  Diagnosis Date  . Arthritis     fingers  . Cancer     rectal  . Status post chemotherapy     colon cancer  . Status post radiation therapy     colon cancer 2013  . Cancer     colon cancer  . Hypertension   . Diverticulosis   . Hemorrhoids   . Osteopenia   . History of chicken pox     PAST SURGICAL HISTORY: Past Surgical History  Procedure Laterality Date  . Breast surgery      cyst removal  . Appendectomy    . Abdominal hysterectomy    . Tonsillectomy    . Eus N/A 01/20/2013    Procedure: LOWER ENDOSCOPIC ULTRASOUND (EUS);  Surgeon: Milus Banister, MD;  Location: [facility];  Service: Endoscopy;  Laterality: N/A;  . Colonoscopy    . Breast cyst aspiration Bilateral     negative  . Colon surgery  06/28/2013    resected tumor from colon; [facility]  . Appendectomy  [id number]    Dr. Pat Patrick  . Abdominal hysterectomy  1980    partial  . Breast surgery  1960    Breast Biopsy  . Dilation and curettage of uterus  1957    FAMILY HISTORY Family History  Problem Relation Age of Onset  . Breast cancer Maternal  Grandmother   . Kidney failure Father     ADVANCED DIRECTIVES:  Patient does have advance healthcare directive, Patient   does not desire to make any changes  HEALTH MAINTENANCE: History  Substance Use Topics  . Smoking status: Current Every Day Smoker -- 0.75 packs/day for 64 years    Types: Cigarettes  . Smokeless tobacco: Never Used  . Alcohol Use: 0.0 oz/week    0 Standard drinks or equivalent per week     Comment: rarely      Allergies  Allergen Reactions  . Amlodipine Besylate Other (See Comments)  . Oxycodone     confusion    Current Outpatient Prescriptions    Medication Sig Dispense Refill  . levofloxacin (LEVAQUIN) 500 MG tablet Take 1 tablet (500 mg total) by mouth daily. 10 tablet 0  . metoprolol succinate (TOPROL-XL) 25 MG 24 hr tablet TAKE ONE-HALF TABLET BY MOUTH ONCE DAILY 30 tablet 0  . metoprolol succinate (TOPROL-XL) 25 MG 24 hr tablet Take 0.5 tablets by mouth daily.    . Multiple Vitamins-Minerals (MULTIVITAMIN ADULT PO) Take 1 tablet by mouth daily.     No current facility-administered medications for this visit.    OBJECTIVE:  Filed Vitals:   05/20/15 1455  BP: 154/74  Pulse: 87  Temp: 97.7 F (36.5 C)  Resp: 20     Body mass index is 21.62 kg/(m^2).    ECOG FS:1 - Symptomatic but completely ambulatory  PHYSICAL EXAM: Gneral  status: Performance status is good.  Patient has not lost significant weight HEENT: No evidence of stomatitis. Sclera and conjunctivae :: No jaundice.   pale looking. Lungs: Air  entry equal on both sides.  No rhonchi.  No rales.  Cardiac: Heart sounds are normal.  No pericardial rub.  No murmur. Lymphatic system: Cervical, axillary, inguinal, lymph nodes not palpable GI: Abdomen is soft.  No ascites.  Liver spleen not palpable.  No tenderness.  Bowel sounds are within normal limit Lower extremity: No edema Neurological system: Higher functions, cranial nerves intact no evidence of peripheral neuropathy. Skin: No rash.  No ecchymosis.Marland Kitchen   LAB RESULTS:  Appointment on 05/20/2015  Component Date Value Ref Range Status  . WBC 05/20/2015 5.6  3.6 - 11.0 K/uL Final  . RBC 05/20/2015 4.46  3.80 - 5.20 MIL/uL Final  . Hemoglobin 05/20/2015 13.8  12.0 - 16.0 g/dL Final  . HCT 05/20/2015 42.3  35.0 - 47.0 % Final  . MCV 05/20/2015 94.9  80.0 - 100.0 fL Final  . MCH 05/20/2015 31.0  26.0 - 34.0 pg Final  . MCHC 05/20/2015 32.6  32.0 - 36.0 g/dL Final  . RDW 05/20/2015 14.3  11.5 - 14.5 % Final  . Platelets 05/20/2015 189  150 - 440 K/uL Final  . Neutrophils Relative % 05/20/2015 70   Final  . Neutro  Abs 05/20/2015 3.9  1.4 - 6.5 K/uL Final  . Lymphocytes Relative 05/20/2015 19   Final  . Lymphs Abs 05/20/2015 1.1  1.0 - 3.6 K/uL Final  . Monocytes Relative 05/20/2015 10   Final  . Monocytes Absolute 05/20/2015 0.5  0.2 - 0.9 K/uL Final  . Eosinophils Relative 05/20/2015 1   Final  . Eosinophils Absolute 05/20/2015 0.1  0 - 0.7 K/uL Final  . Basophils Relative 05/20/2015 0   Final  . Basophils Absolute 05/20/2015 0.0  0 - 0.1 K/uL Final  . Sodium 05/20/2015 137  135 - 145 mmol/L Final  . Potassium 05/20/2015 3.7  3.5 -  5.1 mmol/L Final  . Chloride 05/20/2015 105  101 - 111 mmol/L Final  . CO2 05/20/2015 25  22 - 32 mmol/L Final  . Glucose, Bld 05/20/2015 110* 65 - 99 mg/dL Final  . BUN 05/20/2015 22* 6 - 20 mg/dL Final  . Creatinine, Ser 05/20/2015 1.27* 0.44 - 1.00 mg/dL Final  . Calcium 05/20/2015 8.9  8.9 - 10.3 mg/dL Final  . Total Protein 05/20/2015 7.2  6.5 - 8.1 g/dL Final  . Albumin 05/20/2015 4.1  3.5 - 5.0 g/dL Final  . AST 05/20/2015 20  15 - 41 U/L Final  . ALT 05/20/2015 12* 14 - 54 U/L Final  . Alkaline Phosphatase 05/20/2015 62  38 - 126 U/L Final  . Total Bilirubin 05/20/2015 0.7  0.3 - 1.2 mg/dL Final  . GFR calc non Af Amer 05/20/2015 39* >60 mL/min Final  . GFR calc Af Amer 05/20/2015 45* >60 mL/min Final   Comment: (NOTE) The eGFR has been calculated using the CKD EPI equation. This calculation has not been validated in all clinical situations. eGFR's persistently <60 mL/min signify possible Chronic Kidney Disease.   . Anion gap 05/20/2015 7  5 - 15 Final  . CEA 05/20/2015 4.9* 0.0 - 4.7 ng/mL Final   Comment: (NOTE)       Roche ECLIA methodology       Nonsmokers  <3.9                                     Smokers     <5.6 Performed At: [facility] [place], [place] [id number] Lindon Romp MD YY:[id number]       STUDIES: Dg Chest 2 View  05/14/2015   CLINICAL DATA:  Cough.  EXAM: CHEST  2 VIEW  COMPARISON:  None.   FINDINGS: Mediastinum and hilar structures are normal. Mild right base subsegmental atelectasis and or infiltrate cannot be excluded. No pleural effusion or pneumothorax. Mild cardiomegaly. No pulmonary venous congestion. Degenerative changes and scoliosis thoracic spine .  IMPRESSION: 1. Mild right base subsegmental atelectasis and or infiltrate cannot be excluded. Followup PA and lateral chest X-ray is recommended in 3-4 weeks following trial of antibiotic therapy to ensure resolution and exclude underlying malignancy. 2. Mild cardiomegaly.  No pulmonary venous congestion.   Electronically Signed   By: Marcello Moores  Register   On: 05/14/2015 07:57   Mm Screening Breast Tomo Bilateral  05/02/2015   CLINICAL DATA:  Screening.  EXAM: DIGITAL SCREENING BILATERAL MAMMOGRAM WITH 3D TOMO WITH CAD  COMPARISON:  Previous exam(s).  ACR Breast Density Category c: The breast tissue is heterogeneously dense, which may obscure small masses.  FINDINGS: There are no findings suspicious for malignancy. Images were processed with CAD.  IMPRESSION: No mammographic evidence of malignancy. A result letter of this screening mammogram will be mailed directly to the patient.  RECOMMENDATION: Screening mammogram in one year. (Code:SM-B-01Y)  BI-RADS CATEGORY  1: Negative.   Electronically Signed   By: Curlene Dolphin M.D.   On: 05/02/2015 17:11    ASSESSMENT: Carcinoma of rectum Slightly elevated but stable CEA Arrange for colonoscopy Chest x-ray on 12th of July shows mild right base subsegmental atelectasis or infiltrate repeat chest x-ray has been recommended Screening mammogram dated June of 2016 reported to be normal   MEDICAL DECISION MAKING:  Chest x-ray has been reviewed.  Patient is asymptomatic but repeat x-ray to be sure  it is clearing up in next few months.  CEA will be followed.  On clinical ground there is no evidence of recurrent disease.  All lab data has been reviewed  Patient expressed understanding and was in  agreement with this plan. She also understands that She can call clinic at any time with any questions, concerns, or complaints.    No matching staging information was found for the patient.  [name], MD   05/26/2015 10:15 AM

## 2015-05-27 ENCOUNTER — Other Ambulatory Visit: Payer: Self-pay | Admitting: *Deleted

## 2015-05-27 DIAGNOSIS — C20 Malignant neoplasm of rectum: Secondary | ICD-10-CM

## 2015-05-27 DIAGNOSIS — R05 Cough: Secondary | ICD-10-CM

## 2015-05-27 DIAGNOSIS — R059 Cough, unspecified: Secondary | ICD-10-CM

## 2015-07-11 ENCOUNTER — Inpatient Hospital Stay: Payer: BLUE CROSS/BLUE SHIELD | Admitting: Oncology

## 2015-07-30 ENCOUNTER — Other Ambulatory Visit: Payer: Self-pay | Admitting: Family Medicine

## 2015-07-30 DIAGNOSIS — I10 Essential (primary) hypertension: Secondary | ICD-10-CM

## 2015-07-30 NOTE — Telephone Encounter (Signed)
Last OV 07/2013  Thanks,   -Mickel Baas

## 2015-08-13 ENCOUNTER — Ambulatory Visit
Admission: RE | Admit: 2015-08-13 | Discharge: 2015-08-13 | Disposition: A | Discharge status: Home / Self Care | Payer: Medicare Other | Source: Ambulatory Visit | Attending: Oncology | Admitting: Oncology

## 2015-08-13 DIAGNOSIS — C20 Malignant neoplasm of rectum: Secondary | ICD-10-CM | POA: Insufficient documentation

## 2015-08-13 DIAGNOSIS — R059 Cough, unspecified: Secondary | ICD-10-CM

## 2015-08-13 DIAGNOSIS — R05 Cough: Secondary | ICD-10-CM

## 2015-08-13 DIAGNOSIS — J449 Chronic obstructive pulmonary disease, unspecified: Secondary | ICD-10-CM | POA: Diagnosis not present

## 2015-08-15 ENCOUNTER — Encounter: Payer: Self-pay | Admitting: Oncology

## 2015-08-15 ENCOUNTER — Inpatient Hospital Stay: Payer: Medicare Other | Attending: Oncology | Admitting: Oncology

## 2015-08-15 VITALS — BP 175/90 | HR 80 | Temp 96.6°F | Wt 125.0 lb

## 2015-08-15 DIAGNOSIS — K649 Unspecified hemorrhoids: Secondary | ICD-10-CM | POA: Diagnosis not present

## 2015-08-15 DIAGNOSIS — Z803 Family history of malignant neoplasm of breast: Secondary | ICD-10-CM | POA: Insufficient documentation

## 2015-08-15 DIAGNOSIS — M8580 Other specified disorders of bone density and structure, unspecified site: Secondary | ICD-10-CM | POA: Diagnosis not present

## 2015-08-15 DIAGNOSIS — M5134 Other intervertebral disc degeneration, thoracic region: Secondary | ICD-10-CM | POA: Insufficient documentation

## 2015-08-15 DIAGNOSIS — F0390 Unspecified dementia without behavioral disturbance: Secondary | ICD-10-CM | POA: Insufficient documentation

## 2015-08-15 DIAGNOSIS — C20 Malignant neoplasm of rectum: Secondary | ICD-10-CM | POA: Diagnosis not present

## 2015-08-15 DIAGNOSIS — Z8619 Personal history of other infectious and parasitic diseases: Secondary | ICD-10-CM | POA: Insufficient documentation

## 2015-08-15 DIAGNOSIS — Z923 Personal history of irradiation: Secondary | ICD-10-CM | POA: Insufficient documentation

## 2015-08-15 DIAGNOSIS — Z79899 Other long term (current) drug therapy: Secondary | ICD-10-CM | POA: Diagnosis not present

## 2015-08-15 DIAGNOSIS — I10 Essential (primary) hypertension: Secondary | ICD-10-CM

## 2015-08-15 DIAGNOSIS — Z9221 Personal history of antineoplastic chemotherapy: Secondary | ICD-10-CM | POA: Diagnosis not present

## 2015-08-15 DIAGNOSIS — K5790 Diverticulosis of intestine, part unspecified, without perforation or abscess without bleeding: Secondary | ICD-10-CM | POA: Insufficient documentation

## 2015-08-15 DIAGNOSIS — Z23 Encounter for immunization: Secondary | ICD-10-CM | POA: Diagnosis not present

## 2015-08-15 DIAGNOSIS — M129 Arthropathy, unspecified: Secondary | ICD-10-CM

## 2015-08-15 DIAGNOSIS — F17210 Nicotine dependence, cigarettes, uncomplicated: Secondary | ICD-10-CM | POA: Diagnosis not present

## 2015-08-15 DIAGNOSIS — J449 Chronic obstructive pulmonary disease, unspecified: Secondary | ICD-10-CM | POA: Diagnosis not present

## 2015-08-15 MED ORDER — INFLUENZA VAC SPLIT QUAD 0.5 ML IM SUSY
0.5000 mL | PREFILLED_SYRINGE | Freq: Once | INTRAMUSCULAR | Status: AC
  Administered 2015-08-15: 0.5 mL via INTRAMUSCULAR

## 2015-08-15 NOTE — Progress Notes (Signed)
Patient here today for x-ray results. 

## 2015-08-15 NOTE — Progress Notes (Signed)
[place] @ [facility] [phone]  Fax:([phone]) [place]: September 11, 1933  MR#: [id number]  WIO#:[id number]  Patient Care Team: [name], MD as PCP - General (Family Medicine) [name], MD (General Surgery) [name], MD (Unknown Physician Specialty) [name], MD (Family Medicine) [name], MD (General Surgery)  CHIEF COMPLAINT:  Chief Complaint  Patient presents with  . Results    Oncology History   rectal cancer T3, N1, M0 tumor on chemoradiation therapy Hospitalizations secondary to diarrhea(April, 2014) chemoradiation intrupted. C. difficile diarrhea. 2.Status post abdominal perineal resection pT3  pNO stage II  disease.  (As September, 2014) patient underwent surgery at [facility].  Patient could not tolerate post operative chemotherapy FOLFOX     Rectal cancer ([place])   01/20/2013 Initial Diagnosis Rectal cancer   60 81-year-old lady with a history of rectal cancer INTERVAL HISTORY: Patient came for follow-up.  No rectal bleeding or abdominal pain no nausea no vomiting no diarrhea.  Patient continues to smoke.  Last colonoscopy was a few years ago and new one is being scheduled. Patient is not sure whether she wants to get colonoscopy done either [facility] is going to check with her son.  No abdominal pain.  No nausea.  No vomiting.  No diarrhea. No rectal bleeding. Here for further follow-up and treatment considerations and progressive dementia.  REVIEW OF SYSTEMS:   GENERAL:  Feels good.  Active.  No fevers, sweats or weight loss. PERFORMANCE STATUS (ECOG)01 HEENT:  No visual changes, runny nose, sore throat, mouth sores or tenderness. Lungs: No shortness of breath or cough.  No hemoptysis. Cardiac:  No chest pain, palpitations, orthopnea, or PND. GI:  No nausea, vomiting, diarrhea, constipation, melena or hematochezia. GU:  No urgency, frequency, dysuria, or  hematuria. Musculoskeletal:  No back pain.  No joint pain.  No muscle tenderness. Extremities:  No pain or swelling. Skin:  No rashes or skin changes. Neuro:  No headache, numbness or weakness, balance or coordination issues. Endocrine:  No diabetes, thyroid issues, hot flashes or night sweats. Psych:  No mood changes, depression or anxiety. Pain:  No focal pain. Review of systems:  All other systems reviewed and found to be negative. As per HPI. Otherwise, a complete review of systems is negatve.  PAST MEDICAL HISTORY: Past Medical History  Diagnosis Date  . Arthritis     fingers  . Cancer ([place])     rectal  . Status post chemotherapy     colon cancer  . Status post radiation therapy     colon cancer 2013  . Cancer [facility])     colon cancer  . Hypertension   . Diverticulosis   . Hemorrhoids   . Osteopenia   . History of chicken pox     PAST SURGICAL HISTORY: Past Surgical History  Procedure Laterality Date  . Breast surgery      cyst removal  . Appendectomy    . Abdominal hysterectomy    . Tonsillectomy    . Eus N/A 01/20/2013    Procedure: LOWER ENDOSCOPIC ULTRASOUND (EUS);  Surgeon: Milus Banister, MD;  Location: [facility];  Service: Endoscopy;  Laterality: N/A;  . Colonoscopy    . Breast cyst aspiration Bilateral     negative  . Colon surgery  06/28/2013    resected tumor from colon; [facility]  . Appendectomy  [id number]  Dr. Pat Patrick  . Abdominal hysterectomy  1980    partial  . Breast surgery  1960    Breast Biopsy  . Dilation and curettage of uterus  1957    FAMILY HISTORY Family History  Problem Relation Age of Onset  . Breast cancer Maternal Grandmother   . Kidney failure Father     ADVANCED DIRECTIVES:  Patient does have advance healthcare directive, Patient   does not desire to make any changes  HEALTH MAINTENANCE: Social History  Substance Use Topics  . Smoking status: Current Every Day Smoker -- 0.75 packs/day for 64  years    Types: Cigarettes  . Smokeless tobacco: Never Used  . Alcohol Use: 0.0 oz/week    0 Standard drinks or equivalent per week     Comment: rarely      Allergies  Allergen Reactions  . Amlodipine Besylate Other (See Comments)  . Oxycodone     confusion    Current Outpatient Prescriptions  Medication Sig Dispense Refill  . metoprolol succinate (TOPROL-XL) 25 MG 24 hr tablet Take 0.5 tablets by mouth daily.    . Multiple Vitamins-Minerals (MULTIVITAMIN ADULT PO) Take 1 tablet by mouth daily.    Marland Kitchen levofloxacin (LEVAQUIN) 500 MG tablet Take 1 tablet (500 mg total) by mouth daily. (Patient not taking: Reported on 08/15/2015) 10 tablet 0  . metoprolol succinate (TOPROL-XL) 25 MG 24 hr tablet TAKE ONE-HALF TABLET BY MOUTH ONCE DAILY* NEEDS OFFICE VISIT* (Patient not taking: Reported on 08/15/2015) 90 tablet 3   No current facility-administered medications for this visit.    OBJECTIVE:  Filed Vitals:   08/15/15 1509  BP: 175/90  Pulse: 80  Temp: 96.6 F (35.9 C)     Body mass index is 21.45 kg/(m^2).    ECOG FS:1 - Symptomatic but completely ambulatory  PHYSICAL EXAM: Gneral  status: Performance status is good.  Patient has not lost significant weight HEENT: No evidence of stomatitis. Sclera and conjunctivae :: No jaundice.   pale looking. Lungs: Air  entry equal on both sides.  No rhonchi.  No rales.  Cardiac: Heart sounds are normal.  No pericardial rub.  No murmur. Lymphatic system: Cervical, axillary, inguinal, lymph nodes not palpable GI: Abdomen is soft.  No ascites.  Liver spleen not palpable.  No tenderness.  Bowel sounds are within normal limit Lower extremity: No edema Neurological system: Higher functions, cranial nerves intact no evidence of peripheral neuropathy. Skin: No rash.  No ecchymosis.Marland Kitchen   LAB RESULTS:  No visits with results within 2 Day(s) from this visit. Latest known visit with results is:  Appointment on 05/20/2015  Component Date Value Ref  Range Status  . WBC 05/20/2015 5.6  3.6 - 11.0 K/uL Final  . RBC 05/20/2015 4.46  3.80 - 5.20 MIL/uL Final  . Hemoglobin 05/20/2015 13.8  12.0 - 16.0 g/dL Final  . HCT 05/20/2015 42.3  35.0 - 47.0 % Final  . MCV 05/20/2015 94.9  80.0 - 100.0 fL Final  . MCH 05/20/2015 31.0  26.0 - 34.0 pg Final  . MCHC 05/20/2015 32.6  32.0 - 36.0 g/dL Final  . RDW 05/20/2015 14.3  11.5 - 14.5 % Final  . Platelets 05/20/2015 189  150 - 440 K/uL Final  . Neutrophils Relative % 05/20/2015 70   Final  . Neutro Abs 05/20/2015 3.9  1.4 - 6.5 K/uL Final  . Lymphocytes Relative 05/20/2015 19   Final  . Lymphs Abs 05/20/2015 1.1  1.0 - 3.6 K/uL Final  .  Monocytes Relative 05/20/2015 10   Final  . Monocytes Absolute 05/20/2015 0.5  0.2 - 0.9 K/uL Final  . Eosinophils Relative 05/20/2015 1   Final  . Eosinophils Absolute 05/20/2015 0.1  0 - 0.7 K/uL Final  . Basophils Relative 05/20/2015 0   Final  . Basophils Absolute 05/20/2015 0.0  0 - 0.1 K/uL Final  . Sodium 05/20/2015 137  135 - 145 mmol/L Final  . Potassium 05/20/2015 3.7  3.5 - 5.1 mmol/L Final  . Chloride 05/20/2015 105  101 - 111 mmol/L Final  . CO2 05/20/2015 25  22 - 32 mmol/L Final  . Glucose, Bld 05/20/2015 110* 65 - 99 mg/dL Final  . BUN 05/20/2015 22* 6 - 20 mg/dL Final  . Creatinine, Ser 05/20/2015 1.27* 0.44 - 1.00 mg/dL Final  . Calcium 05/20/2015 8.9  8.9 - 10.3 mg/dL Final  . Total Protein 05/20/2015 7.2  6.5 - 8.1 g/dL Final  . Albumin 05/20/2015 4.1  3.5 - 5.0 g/dL Final  . AST 05/20/2015 20  15 - 41 U/L Final  . ALT 05/20/2015 12* 14 - 54 U/L Final  . Alkaline Phosphatase 05/20/2015 62  38 - 126 U/L Final  . Total Bilirubin 05/20/2015 0.7  0.3 - 1.2 mg/dL Final  . GFR calc non Af Amer 05/20/2015 39* >60 mL/min Final  . GFR calc Af Amer 05/20/2015 45* >60 mL/min Final   Comment: (NOTE) The eGFR has been calculated using the CKD EPI equation. This calculation has not been validated in all clinical situations. eGFR's persistently <60  mL/min signify possible Chronic Kidney Disease.   . Anion gap 05/20/2015 7  5 - 15 Final  . CEA 05/20/2015 4.9* 0.0 - 4.7 ng/mL Final   Comment: (NOTE)       Roche ECLIA methodology       Nonsmokers  <3.9                                     Smokers     <5.6 Performed At: [facility] [place], [place] [id number] Lindon Romp MD TG:[id number]       STUDIES: Dg Chest 2 View  08/13/2015  CLINICAL DATA:  One month or more history of cough, former smoker, history of rectal cancer. EXAM: CHEST  2 VIEW COMPARISON:  Portable chest x-ray of February 09, 2013 FINDINGS: The lungs are hyperinflated with hemidiaphragm flattening and increased AP dimension of the thorax. There is are no pulmonary parenchymal masses or nodules. There is no alveolar infiltrate. The heart is normal in size. The pulmonary vascularity is not engorged. There is mild multilevel degenerative disc disease of the thoracic spine. There is old deformity of the posterior aspect of the left fourth rib. The Port-A-Cath appliance is been removed. IMPRESSION: COPD. There is no acute cardiopulmonary abnormality. There is no evidence of metastatic disease to the lungs. Electronically Signed   By: David  Martinique M.D.   On: 08/13/2015 14:52    ASSESSMENT: Carcinoma of rectum Slightly elevated but stable CEA 4.  Of one year CEA has not increased. Chest x-ray has been reviewed and there is no evidence of recurrent disease.  Chest x-ray has been reviewed independently. Arrange for colonoscopy.  I have asked patient to check with her son to let us know whether they are planning to get a colonoscopy at [facility] or at local institution in which case patient  can have a colonoscopy done either by Dr. Bary Castilla or by gastroenterologist at [facility]  clinic If you want to get colonoscopy done at [facility] son will arrange that.  As he has all the information. Screening mammogram dated June of 2016 reported to  be normal. Patient was advised to get flu vaccine  Patient expressed understanding and was in agreement with this plan. She also understands that She can call clinic at any time with any questions, concerns, or complaints.    No matching staging information was found for the patient.  [name], MD   08/15/2015 3:37 PM

## 2015-08-16 ENCOUNTER — Telehealth: Payer: Self-pay | Admitting: *Deleted

## 2015-08-16 NOTE — Telephone Encounter (Addendum)
Called asking that we go ahead and make her an appt for a colonoscopy as discussed at her appt. She wants it done at [facility]

## 2015-08-19 NOTE — Telephone Encounter (Signed)
Will arrange appt to see Dr. Bary Castilla for colonoscopy.

## 2015-09-12 ENCOUNTER — Ambulatory Visit (INDEPENDENT_AMBULATORY_CARE_PROVIDER_SITE_OTHER): Payer: Medicare Other | Admitting: General Surgery

## 2015-09-12 ENCOUNTER — Encounter: Payer: Self-pay | Admitting: General Surgery

## 2015-09-12 VITALS — BP 130/68 | HR 70 | Resp 12 | Ht 66.0 in | Wt 124.0 lb

## 2015-09-12 DIAGNOSIS — C20 Malignant neoplasm of rectum: Secondary | ICD-10-CM | POA: Diagnosis not present

## 2015-09-12 MED ORDER — POLYETHYLENE GLYCOL 3350 17 GM/SCOOP PO POWD: ORAL | Status: DC

## 2015-09-12 NOTE — Progress Notes (Signed)
Patient ID: Emily [name], female   DOB: 12-Oct-1933, 81 y.o.   MRN: [id number]  Chief Complaint  Patient presents with  . Other    colonoscopy    HPI [name] is a 81 y.o. female here today for a evaluation of a colonoscopy. Last colonoscopy was in 2014. Patient has a history of rectal cancer. No GI problems.  The patient underwent neoadjuvant chemoradiation and subsequently underwent surgery at [facility] in August 2014. J-pouch reconstruction with a temporary ileostomy was completed with the ileostomy closed several months later. HPI  Past Medical History  Diagnosis Date  . Arthritis     fingers  . Cancer ([place])     rectal  . Status post chemotherapy     colon cancer  . Status post radiation therapy     colon cancer 2013  . Cancer [facility])     colon cancer  . Hypertension   . Diverticulosis   . Hemorrhoids   . Osteopenia   . History of chicken pox     Past Surgical History  Procedure Laterality Date  . Breast surgery      cyst removal  . Appendectomy    . Abdominal hysterectomy    . Tonsillectomy    . Eus N/A 01/20/2013    Procedure: LOWER ENDOSCOPIC ULTRASOUND (EUS);  Surgeon: Milus Banister, MD;  Location: [facility];  Service: Endoscopy;  Laterality: N/A;  . Colonoscopy    . Breast cyst aspiration Bilateral     negative  . Colon surgery  06/28/2013    resected tumor from colon; [facility]  . Appendectomy  [id number]    Dr. Pat Patrick  . Abdominal hysterectomy  1980    partial  . Breast surgery  1960    Breast Biopsy  . Dilation and curettage of uterus  1957    Family History  Problem Relation Age of Onset  . Breast cancer Maternal Grandmother   . Kidney failure Father     Social History Social History  Substance Use Topics  . Smoking status: Current Every Day Smoker -- 0.75 packs/day for 64 years    Types: Cigarettes  . Smokeless tobacco: Never Used  . Alcohol Use: 0.0 oz/week    0 Standard drinks or equivalent per  week     Comment: rarely    Allergies  Allergen Reactions  . Amlodipine Besylate Other (See Comments)  . Oxycodone     confusion    Current Outpatient Prescriptions  Medication Sig Dispense Refill  . metoprolol succinate (TOPROL-XL) 25 MG 24 hr tablet TAKE ONE-HALF TABLET BY MOUTH ONCE DAILY* NEEDS OFFICE VISIT* 90 tablet 3  . Multiple Vitamins-Minerals (MULTIVITAMIN ADULT PO) Take 1 tablet by mouth daily.    . polyethylene glycol powder (GLYCOLAX/MIRALAX) powder 255 grams one bottle for colonoscopy prep 255 g 0   No current facility-administered medications for this visit.    Review of Systems Review of Systems  Constitutional: Negative.   Respiratory: Negative.   Cardiovascular: Negative.     Blood pressure 130/68, pulse 70, resp. rate 12, height 5\' 6"  (1.676 m), weight 124 lb (56.246 kg).  The patient's weight is up 5 pounds from her evaluation in April 2014 prior to port placement.  Physical Exam Physical Exam  Constitutional: She is oriented to person, place, and time. She appears well-developed and well-nourished.  Eyes: Conjunctivae are normal. No scleral icterus.  Neck: Neck supple.  Cardiovascular: Normal rate, regular rhythm and normal heart sounds.  Pulmonary/Chest: Effort normal and breath sounds normal.  Abdominal: Soft. Bowel sounds are normal. There is no tenderness.  Lymphadenopathy:    She has no cervical adenopathy.  Neurological: She is alert and oriented to person, place, and time.  Skin: Skin is warm and dry.    Data Reviewed Operative report from [facility] June 28, 2013:   SURGEON: Surgeon(s) and Role: * Creig Hines, MD - Primary * Merleen Nicely, MD - Resident - Assisting  STAFF: Circulator: Felicismo T Colbert Coyer, RN; Rance Muir, RN; Lamar Blinks, RN; Grayce Sessions, RN Scrub: Thalia Bloodgood; Edd Fabian  ANESTHESIA: General   OPERATIVE FINDINGS: no evidence of metastatic disease, severe diverticular disease in the  sigmoid colon, pelvic adhesions from prior hysterectomy  OPERATIVE REPORT:   Brief clinical note this patient is a 81 year old female with a rectal cancer at 8 centimeters from the anus who received preoperative chemo or radiation, although she was unable to complete it. She is brought electively to the OR for a LAR/CAA/CJ/LI.  The was brought to the OR and given general endotracheal intubation and placed in the lithotomy position. The left arm tucked in for an appropriate padding placed. She was prepped and draped in the usual sterile fashion . A midline incision was made and dissection was carried down to the fascia level . The peritoneal cavity was entered. No evidence of any metastatic disease was noted , the liver was smooth and the gall bladder appeared normal. We took down the white line of Toldt left-side and all retroperitoneal structures were pushed downwards. We identified the left ureter and preserve this. We then completely mobilized the left colon and splenic flexure with cautery. The omentum was taken off of her distal transverse colon. We divided the IMV just anterior to the pancreas with the Ligasure. We also took the IMA at the takeoff of the aorta. We took the sigmoid mesentery to the junction of the descending and sigmoid colon. We divided the marginal artery and pulsatile bleeding was noted.   We divided the colon at approximately the junction of the descending and sigmoid colon with a single firing of the linear 80 Stapler. We packed the small bowel and colon in the upper abdomen unrelated pelvic retractors were inserted. A portion of the ileum was stuck to the proximal rectum. We sharply dissected this.   She had severe diverticular disease in the sigmoid colon and we needed to mobilize this off of the ovaries and adhesions from her hysterectomy. We the entered the presacral space and dissected all the way down to her levators muscles using sharp dissection We identified both  hypogastric nerves bilaterally and preserve them. Lateral dissection was performed in similar fashion. Then we approach this anteriorly dissected below the vagina all the way to the levators. The plane was indistinct secondary to her previous hysterectomy. Once we were at her levators, a PI-30 stapler was brought in and was used to divide the rectum. The specimen was removed. We then brought the colon back in the field and this fell into the pelvis with ease. We constructed a 8 cm colonic J pouch by folding the colon back on itself, and making a colotomy and firing a single load of the GIA 80 stapler. No bleeding was noted from the staple in. We oversewed the tip of the J with 3-0 vicryl sutures. A 2-0 prolene whip stitch was placed around the colotomy and a 28 EEA anvil was inserted into this and tied down.  The EEA stapler was brought in through the anus and the spike entered exactly at the staple line. The 2 ends were connected together and the gun was snugged down and fired. We took care to push the vagina out of harms way. 2 intact donuts were noted. The pelvis was filled with saline and proctoscopic examination was performed. No evidence of an air leak was noted, the anastomosis appeared pink, viable with no active bleeding. A loop of ileum approximately 20 centimeters from the cecum was used to construct an ileostomy. This was marked proximally and distally and a red rubber catheter was brought underneath this to act as a bridge. The disc of skin was removed at a previously marked spot and dissection was carried down the anterior fascia which was divided. Muscle-splitting incision was made and the ileostomy was brought up through this. Two sheets of Seprafilm replaced in the abdomen 1 wrapping around the ileostomy. The small bowel and colon replaced back in the normal anatomic position and the omentum was placed over this. The fascia was closed using a running looped 1. Maxon suture. The fat was irrigated  extensively and the skin was closed with staples and a strip VAC was applied over this. The ileostomy was matured in the standard fashion using 3-0 chromic sutures. The patient tolerated the procedure well, was extubated in the afferent and brought back to the PACU in stable condition  ESTIMATED BLOOD LOSS: 300 mL  Pre-op colonoscopy dated 01/05/2013 completed by Dr. Dionne Milo reported a 25 mm pedunculated polyp in the sigmoid colon, pathology showed tubular adenoma. A mass at 5-6 cm showed to be intramucosal adenocarcinoma arising in a tubulovillous adenoma.  Review of the pathology report from [facility] did not make any mention of identification of the polyp removal site from the sigmoid colon. A 31 cm segment was reported, which would likely have included at least the distal half of the sigmoid. The polyp reported by the endoscopist was at 20 cm suggesting that it the area of the polyp was removed with the rectal segment.   Assessment    Past history rectal cancer status status post J-pouch reconstruction. Candidate for follow-up colonoscopy.     Plan    The procedure was reviewed with the patient and her son.     Colonoscopy with possible biopsy/polypectomy prn: Information regarding the procedure, including its potential risks and complications (including but not limited to perforation of the bowel, which may require emergency surgery to repair, and bleeding) was verbally given to the patient. Educational information regarding lower intestinal endoscopy was given to the patient. Written instructions for how to complete the bowel prep using Miralax were provided. The importance of drinking ample fluids to avoid dehydration as a result of the prep emphasized.  Patient's surgery has been scheduled for 10-01-15 at [facility].  PCP:  Wilma Flavin 09/13/2015, 6:48 AM

## 2015-09-12 NOTE — Patient Instructions (Addendum)
Colonoscopy A colonoscopy is an exam to look at the entire large intestine (colon). This exam can help find problems such as tumors, polyps, inflammation, and areas of bleeding. The exam takes about 1 hour.  LET [facility] CARE PROVIDER KNOW ABOUT:   Any allergies you have.  All medicines you are taking, including vitamins, herbs, eye drops, creams, and over-the-counter medicines.  Previous problems you or members of your family have had with the use of anesthetics.  Any blood disorders you have.  Previous surgeries you have had.  Medical conditions you have. RISKS AND COMPLICATIONS  Generally, this is a safe procedure. However, as with any procedure, complications can occur. Possible complications include:  Bleeding.  Tearing or rupture of the colon wall.  Reaction to medicines given during the exam.  Infection (rare). BEFORE THE PROCEDURE   Ask your health care provider about changing or stopping your regular medicines.  You may be prescribed an oral bowel prep. This involves drinking a large amount of medicated liquid, starting the day before your procedure. The liquid will cause you to have multiple loose stools until your stool is almost clear or light green. This cleans out your colon in preparation for the procedure.  Do not eat or drink anything else once you have started the bowel prep, unless your health care provider tells you it is safe to do so.  Arrange for someone to drive you home after the procedure. PROCEDURE   You will be given medicine to help you relax (sedative).  You will lie on your side with your knees bent.  A long, flexible tube with a light and camera on the end (colonoscope) will be inserted through the rectum and into the colon. The camera sends video back to a computer screen as it moves through the colon. The colonoscope also releases carbon dioxide gas to inflate the colon. This helps your health care provider see the area better.  During  the exam, your health care provider may take a small tissue sample (biopsy) to be examined under a microscope if any abnormalities are found.  The exam is finished when the entire colon has been viewed. AFTER THE PROCEDURE   Do not drive for 24 hours after the exam.  You may have a small amount of blood in your stool.  You may pass moderate amounts of gas and have mild abdominal cramping or bloating. This is caused by the gas used to inflate your colon during the exam.  Ask when your test results will be ready and how you will get your results. Make sure you get your test results.   This information is not intended to replace advice given to you by your health care provider. Make sure you discuss any questions you have with your health care provider.   Document Released: 10/16/2000 Document Revised: 08/09/2013 Document Reviewed: 06/26/2013 Elsevier Interactive Patient Education 2016 [place].  Patient's surgery has been scheduled for 10-01-15 at [facility]

## 2015-09-13 NOTE — H&P (Signed)
HPI  Emily [name] is a 81 y.o. female here today for a evaluation of a colonoscopy. Last colonoscopy was in 2014. Patient has a history of rectal cancer. No GI problems.  The patient underwent neoadjuvant chemoradiation and subsequently underwent surgery at [facility] in August 2014. J-pouch reconstruction with a temporary ileostomy was completed with the ileostomy closed several months later.  HPI  Past Medical History   Diagnosis  Date   .  Arthritis      fingers   .  Cancer ([place])      rectal   .  Status post chemotherapy      colon cancer   .  Status post radiation therapy      colon cancer 2013   .  Cancer [facility])      colon cancer   .  Hypertension    .  Diverticulosis    .  Hemorrhoids    .  Osteopenia    .  History of chicken pox     Past Surgical History   Procedure  Laterality  Date   .  Breast surgery       cyst removal   .  Appendectomy     .  Abdominal hysterectomy     .  Tonsillectomy     .  Eus  N/A  01/20/2013     Procedure: LOWER ENDOSCOPIC ULTRASOUND (EUS); Surgeon: Milus Banister, MD; Location: [facility]; Service: Endoscopy; Laterality: N/A;   .  Colonoscopy     .  Breast cyst aspiration  Bilateral      negative   .  Colon surgery   06/28/2013     resected tumor from colon; [facility]   .  Appendectomy   [id number]     Dr. Pat Patrick   .  Abdominal hysterectomy   1980     partial   .  Breast surgery   1960     Breast Biopsy   .  Dilation and curettage of uterus   1957    Family History   Problem  Relation  Age of Onset   .  Breast cancer  Maternal Grandmother    .  Kidney failure  Father     Social History  Social History   Substance Use Topics   .  Smoking status:  Current Every Day Smoker -- 0.75 packs/day for 64 years     Types:  Cigarettes   .  Smokeless tobacco:  Never Used   .  Alcohol Use:  0.0 oz/week     0 Standard drinks or equivalent per week      Comment: rarely    Allergies   Allergen  Reactions   .   Amlodipine Besylate  Other (See Comments)   .  Oxycodone      confusion    Current Outpatient Prescriptions   Medication  Sig  Dispense  Refill   .  metoprolol succinate (TOPROL-XL) 25 MG 24 hr tablet  TAKE ONE-HALF TABLET BY MOUTH ONCE DAILY* NEEDS OFFICE VISIT*  90 tablet  3   .  Multiple Vitamins-Minerals (MULTIVITAMIN ADULT PO)  Take 1 tablet by mouth daily.     .  polyethylene glycol powder (GLYCOLAX/MIRALAX) powder  255 grams one bottle for colonoscopy prep  255 g  0    No current facility-administered medications for this visit.    Review of Systems  Review of Systems  Constitutional: Negative.  Respiratory: Negative.  Cardiovascular: Negative.  Blood pressure 130/68, pulse 70, resp. rate 12, height 5\' 6"  (1.676 m), weight 124 lb (56.246 kg).  The patient's weight is up 5 pounds from her evaluation in April 2014 prior to port placement.  Physical Exam  Physical Exam  Constitutional: She is oriented to person, place, and time. She appears well-developed and well-nourished.  Eyes: Conjunctivae are normal. No scleral icterus.  Neck: Neck supple.  Cardiovascular: Normal rate, regular rhythm and normal heart sounds.  Pulmonary/Chest: Effort normal and breath sounds normal.  Abdominal: Soft. Bowel sounds are normal. There is no tenderness.  Lymphadenopathy:  She has no cervical adenopathy.  Neurological: She is alert and oriented to person, place, and time.  Skin: Skin is warm and dry.   Data Reviewed  Operative report from [facility] June 28, 2013:  SURGEON: Surgeon(s) and Role: * Creig Hines, MD - Primary * Merleen Nicely, MD - Resident - Assisting  STAFF: Circulator: Felicismo T Colbert Coyer, RN; Rance Muir, RN; Lamar Blinks, RN; Grayce Sessions, RN Scrub: Thalia Bloodgood; Edd Fabian  ANESTHESIA: General   OPERATIVE FINDINGS: no evidence of metastatic disease, severe diverticular disease in the sigmoid colon, pelvic adhesions from prior  hysterectomy  OPERATIVE REPORT:   Brief clinical note this patient is a 81 year old female with a rectal cancer at 8 centimeters from the anus who received preoperative chemo or radiation, although she was unable to complete it. She is brought electively to the OR for a LAR/CAA/CJ/LI.  The was brought to the OR and given general endotracheal intubation and placed in the lithotomy position. The left arm tucked in for an appropriate padding placed. She was prepped and draped in the usual sterile fashion . A midline incision was made and dissection was carried down to the fascia level . The peritoneal cavity was entered. No evidence of any metastatic disease was noted , the liver was smooth and the gall bladder appeared normal. We took down the white line of Toldt left-side and all retroperitoneal structures were pushed downwards. We identified the left ureter and preserve this. We then completely mobilized the left colon and splenic flexure with cautery. The omentum was taken off of her distal transverse colon. We divided the IMV just anterior to the pancreas with the Ligasure. We also took the IMA at the takeoff of the aorta. We took the sigmoid mesentery to the junction of the descending and sigmoid colon. We divided the marginal artery and pulsatile bleeding was noted.   We divided the colon at approximately the junction of the descending and sigmoid colon with a single firing of the linear 80 Stapler. We packed the small bowel and colon in the upper abdomen unrelated pelvic retractors were inserted. A portion of the ileum was stuck to the proximal rectum. We sharply dissected this.   She had severe diverticular disease in the sigmoid colon and we needed to mobilize this off of the ovaries and adhesions from her hysterectomy. We the entered the presacral space and dissected all the way down to her levators muscles using sharp dissection We identified both hypogastric nerves bilaterally and preserve them.  Lateral dissection was performed in similar fashion. Then we approach this anteriorly dissected below the vagina all the way to the levators. The plane was indistinct secondary to her previous hysterectomy. Once we were at her levators, a PI-30 stapler was brought in and was used to divide the rectum. The specimen was removed. We then brought the colon back in the field and this  fell into the pelvis with ease. We constructed a 8 cm colonic J pouch by folding the colon back on itself, and making a colotomy and firing a single load of the GIA 80 stapler. No bleeding was noted from the staple in. We oversewed the tip of the J with 3-0 vicryl sutures. A 2-0 prolene whip stitch was placed around the colotomy and a 28 EEA anvil was inserted into this and tied down.   The EEA stapler was brought in through the anus and the spike entered exactly at the staple line. The 2 ends were connected together and the gun was snugged down and fired. We took care to push the vagina out of harms way. 2 intact donuts were noted. The pelvis was filled with saline and proctoscopic examination was performed. No evidence of an air leak was noted, the anastomosis appeared pink, viable with no active bleeding. A loop of ileum approximately 20 centimeters from the cecum was used to construct an ileostomy. This was marked proximally and distally and a red rubber catheter was brought underneath this to act as a bridge. The disc of skin was removed at a previously marked spot and dissection was carried down the anterior fascia which was divided. Muscle-splitting incision was made and the ileostomy was brought up through this. Two sheets of Seprafilm replaced in the abdomen 1 wrapping around the ileostomy. The small bowel and colon replaced back in the normal anatomic position and the omentum was placed over this. The fascia was closed using a running looped 1. Maxon suture. The fat was irrigated extensively and the skin was closed with staples  and a strip VAC was applied over this. The ileostomy was matured in the standard fashion using 3-0 chromic sutures. The patient tolerated the procedure well, was extubated in the afferent and brought back to the PACU in stable condition  ESTIMATED BLOOD LOSS: 300 mL  Pre-op colonoscopy dated 01/05/2013 completed by Dr. Dionne Milo reported a 25 mm pedunculated polyp in the sigmoid colon, pathology showed tubular adenoma. A mass at 5-6 cm showed to be intramucosal adenocarcinoma arising in a tubulovillous adenoma.  Review of the pathology report from [facility] did not make any mention of identification of the polyp removal site from the sigmoid colon. A 31 cm segment was reported, which would likely have included at least the distal half of the sigmoid. The polyp reported by the endoscopist was at 20 cm suggesting that it the area of the polyp was removed with the rectal segment.  Assessment   Past history rectal cancer status status post J-pouch reconstruction. Candidate for follow-up colonoscopy.   Plan   The procedure was reviewed with the patient and her son.   Colonoscopy with possible biopsy/polypectomy prn: Information regarding the procedure, including its potential risks and complications (including but not limited to perforation of the bowel, which may require emergency surgery to repair, and bleeding) was verbally given to the patient. Educational information regarding lower intestinal endoscopy was given to the patient. Written instructions for how to complete the bowel prep using Miralax were provided. The importance of drinking ample fluids to avoid dehydration as a result of the prep emphasized.  Patient's surgery has been scheduled for 10-01-15 at [facility].  PCP: Wilma Flavin  09/13/2015, 6:48 AM

## 2015-09-30 ENCOUNTER — Encounter: Payer: Self-pay | Admitting: *Deleted

## 2015-09-30 ENCOUNTER — Telehealth: Payer: Self-pay | Admitting: General Surgery

## 2015-09-30 NOTE — Telephone Encounter (Signed)
09-16-15 I CALLED & L/M FOR PT TO RE MY CALL.I NEEDED HER TO FIND OUT WHERE SHE HAD HER LAST COLONOSCOPY.BECAUSE  [facility] DID NOT SHOW SHE HAD A COLONOSCOPY DONE AT THERE FACILITY. [facility]

## 2015-10-01 ENCOUNTER — Encounter
Admission: RE | Disposition: A | Discharge status: Home / Self Care | Payer: Self-pay | Source: Ambulatory Visit | Attending: General Surgery

## 2015-10-01 ENCOUNTER — Ambulatory Visit: Payer: Medicare Other | Admitting: Anesthesiology

## 2015-10-01 ENCOUNTER — Ambulatory Visit
Admission: RE | Admit: 2015-10-01 | Discharge: 2015-10-01 | Disposition: A | Discharge status: Home / Self Care | Payer: Medicare Other | Source: Ambulatory Visit | Attending: General Surgery | Admitting: General Surgery

## 2015-10-01 ENCOUNTER — Encounter: Payer: Self-pay | Admitting: *Deleted

## 2015-10-01 DIAGNOSIS — Z885 Allergy status to narcotic agent status: Secondary | ICD-10-CM | POA: Insufficient documentation

## 2015-10-01 DIAGNOSIS — F17210 Nicotine dependence, cigarettes, uncomplicated: Secondary | ICD-10-CM | POA: Insufficient documentation

## 2015-10-01 DIAGNOSIS — Z9221 Personal history of antineoplastic chemotherapy: Secondary | ICD-10-CM | POA: Insufficient documentation

## 2015-10-01 DIAGNOSIS — M8580 Other specified disorders of bone density and structure, unspecified site: Secondary | ICD-10-CM | POA: Insufficient documentation

## 2015-10-01 DIAGNOSIS — Z79899 Other long term (current) drug therapy: Secondary | ICD-10-CM | POA: Diagnosis not present

## 2015-10-01 DIAGNOSIS — Z90710 Acquired absence of both cervix and uterus: Secondary | ICD-10-CM | POA: Insufficient documentation

## 2015-10-01 DIAGNOSIS — K5730 Diverticulosis of large intestine without perforation or abscess without bleeding: Secondary | ICD-10-CM | POA: Insufficient documentation

## 2015-10-01 DIAGNOSIS — Z980 Intestinal bypass and anastomosis status: Secondary | ICD-10-CM | POA: Insufficient documentation

## 2015-10-01 DIAGNOSIS — Z923 Personal history of irradiation: Secondary | ICD-10-CM | POA: Insufficient documentation

## 2015-10-01 DIAGNOSIS — D120 Benign neoplasm of cecum: Secondary | ICD-10-CM | POA: Diagnosis not present

## 2015-10-01 DIAGNOSIS — C20 Malignant neoplasm of rectum: Secondary | ICD-10-CM

## 2015-10-01 DIAGNOSIS — Z85048 Personal history of other malignant neoplasm of rectum, rectosigmoid junction, and anus: Secondary | ICD-10-CM | POA: Diagnosis not present

## 2015-10-01 DIAGNOSIS — Z888 Allergy status to other drugs, medicaments and biological substances status: Secondary | ICD-10-CM | POA: Insufficient documentation

## 2015-10-01 DIAGNOSIS — K5790 Diverticulosis of intestine, part unspecified, without perforation or abscess without bleeding: Secondary | ICD-10-CM | POA: Diagnosis not present

## 2015-10-01 DIAGNOSIS — K649 Unspecified hemorrhoids: Secondary | ICD-10-CM | POA: Diagnosis not present

## 2015-10-01 DIAGNOSIS — Z85038 Personal history of other malignant neoplasm of large intestine: Secondary | ICD-10-CM | POA: Insufficient documentation

## 2015-10-01 DIAGNOSIS — I10 Essential (primary) hypertension: Secondary | ICD-10-CM | POA: Diagnosis not present

## 2015-10-01 HISTORY — PX: COLONOSCOPY: SHX5424

## 2015-10-01 SURGERY — COLONOSCOPY
Anesthesia: General

## 2015-10-01 MED ORDER — LIDOCAINE HCL (CARDIAC) 20 MG/ML IV SOLN
INTRAVENOUS | Status: DC | PRN
  Administered 2015-10-01: 60 mg via INTRAVENOUS

## 2015-10-01 MED ORDER — SODIUM CHLORIDE 0.9 % IV SOLN
INTRAVENOUS | Status: DC
  Administered 2015-10-01: 14:00:00 via INTRAVENOUS

## 2015-10-01 MED ORDER — PROPOFOL 500 MG/50ML IV EMUL
INTRAVENOUS | Status: DC | PRN
  Administered 2015-10-01: 75 ug/kg/min via INTRAVENOUS

## 2015-10-01 MED ORDER — PROPOFOL 10 MG/ML IV BOLUS
INTRAVENOUS | Status: DC | PRN
  Administered 2015-10-01: 50 mg via INTRAVENOUS

## 2015-10-01 NOTE — Anesthesia Preprocedure Evaluation (Signed)
Anesthesia Evaluation  Patient identified by MRN, date of birth, ID band Patient awake    Reviewed: Allergy & Precautions, reviewed documented beta blocker date and time   Airway Mallampati: II       Dental  (+) Upper Dentures, Partial Lower   Pulmonary COPD, Current Smoker,     + decreased breath sounds      Cardiovascular Exercise Tolerance: Poor hypertension, Pt. on home beta blockers  Rhythm:Regular     Neuro/Psych    GI/Hepatic Neg liver ROS, GERD  ,  Endo/Other  negative endocrine ROS  Renal/GU      Musculoskeletal   Abdominal Normal abdominal exam  (+)   Peds  Hematology negative hematology ROS (+)   Anesthesia Other Findings   Reproductive/Obstetrics                             Anesthesia Physical Anesthesia Plan  ASA: III  Anesthesia Plan: General   Post-op Pain Management:    Induction: Intravenous  Airway Management Planned: Nasal Cannula  Additional Equipment:   Intra-op Plan:   Post-operative Plan:   Informed Consent: I have reviewed the patients History and Physical, chart, labs and discussed the procedure including the risks, benefits and alternatives for the proposed anesthesia with the patient or authorized representative who has indicated his/her understanding and acceptance.     Plan Discussed with: CRNA  Anesthesia Plan Comments:         Anesthesia Quick Evaluation

## 2015-10-01 NOTE — Op Note (Signed)
[facility] Gastroenterology Patient Name: Emily [name] Procedure Date: 10/01/2015 2:20 PM MRN: [id number] Account #: [id number] Date of Birth: [birth year] Admit Type: Outpatient Age: 81 Room: [facility] ENDO ROOM 1 Gender: Female Note Status: Finalized Procedure:         Colonoscopy Indications:       High risk colon cancer surveillance: Personal history of                     colon cancer Providers:         [name], MD Referring MD:      Kirstie Peri. Caryn Section, MD (Referring MD) Medicines:         Monitored Anesthesia Care Complications:     No immediate complications. Procedure:         Pre-Anesthesia Assessment:                    - Prior to the procedure, a History and Physical was                     performed, and patient medications, allergies and                     sensitivities were reviewed. The patient's tolerance of                     previous anesthesia was reviewed.                    - The risks and benefits of the procedure and the sedation                     options and risks were discussed with the patient. All                     questions were answered and informed consent was obtained.                    After obtaining informed consent, the colonoscope was                     passed under direct vision. Throughout the procedure, the                     patient's blood pressure, pulse, and oxygen saturations                     were monitored continuously. The Colonoscope was                     introduced through the anus and advanced to the the                     terminal ileum. The colonoscopy was performed without                     difficulty. The patient tolerated the procedure well. The                     quality of the bowel preparation was good. Findings:      A 10 mm polyp was found in the cecum. The polyp was sessile. Biopsies       were taken with a cold forceps for histology.      A 5 mm polyp was found at the  anastomosis. The polyp was pedunculated. Impression:        - One 10 mm polyp in the cecum. Biopsied.                    - One 5 mm polyp at the anastomosis. Recommendation:    - Telephone endoscopist for pathology results in 1 week. Procedure Code(s): --- Professional ---                    [phone], Colonoscopy, flexible; with biopsy, single or                     multiple Diagnosis Code(s): --- Professional ---                    [phone], Personal history of other malignant neoplasm of                     large intestine                    D12.0, Benign neoplasm of cecum CPT copyright 2014 American Medical Association. All rights reserved. The codes documented in this report are preliminary and upon coder review may  be revised to meet current compliance requirements. [name], MD 10/01/2015 2:49:16 PM This report has been signed electronically. Number of Addenda: 0 Note Initiated On: 10/01/2015 2:20 PM Scope Withdrawal Time: 0 hours 9 minutes 3 seconds  Total Procedure Duration: 0 hours 13 minutes 43 seconds       [facility]

## 2015-10-01 NOTE — Transfer of Care (Signed)
Immediate Anesthesia Transfer of Care Note  Patient: Emily [name]  Procedure(s) Performed: Procedure(s): COLONOSCOPY (N/A)  Patient Location: PACU  Anesthesia Type:General  Level of Consciousness: awake and patient cooperative  Airway & Oxygen Therapy: Patient Spontanous Breathing and Patient connected to nasal cannula oxygen  Post-op Assessment: Report given to RN  Post vital signs: Reviewed and stable  Last Vitals:  Filed Vitals:   10/01/15 1327 10/01/15 1454  BP: 163/84 122/53  Pulse: 96 71  Temp: 35.7 C 36.6C  Resp: 14 18    Complications: No apparent anesthesia complications

## 2015-10-01 NOTE — Anesthesia Postprocedure Evaluation (Signed)
Anesthesia Post Note  Patient: Emily [name]  Procedure(s) Performed: Procedure(s) (LRB): COLONOSCOPY (N/A)  Patient location during evaluation: PACU Anesthesia Type: General Level of consciousness: awake Pain management: pain level controlled Vital Signs Assessment: post-procedure vital signs reviewed and stable Respiratory status: respiratory function stable Cardiovascular status: stable Anesthetic complications: no    Last Vitals:  Filed Vitals:   10/01/15 1327  BP: 163/84  Pulse: 96  Temp: 35.7 C  Resp: 14    Last Pain: There were no vitals filed for this visit.               VAN STAVEREN,Darlyn Repsher

## 2015-10-01 NOTE — H&P (Signed)
ZARELA ROBBINS [id number] 07/21/1933     HPI: Previous rectal cancer. First exam s/p resection.   Prescriptions prior to admission  Medication Sig Dispense Refill Last Dose  . metoprolol succinate (TOPROL-XL) 25 MG 24 hr tablet TAKE ONE-HALF TABLET BY MOUTH ONCE DAILY* NEEDS OFFICE VISIT* 90 tablet 3 09/30/2015 at 1200  . Multiple Vitamins-Minerals (MULTIVITAMIN ADULT PO) Take 1 tablet by mouth daily.   Taking  . polyethylene glycol powder (GLYCOLAX/MIRALAX) powder 255 grams one bottle for colonoscopy prep 255 g 0    Allergies  Allergen Reactions  . Amlodipine Besylate Other (See Comments)  . Oxycodone     confusion   Past Medical History  Diagnosis Date  . Arthritis     fingers  . Cancer ([place])     rectal  . Status post chemotherapy     colon cancer  . Status post radiation therapy     colon cancer 2013  . Cancer [facility])     colon cancer  . Hypertension   . Diverticulosis   . Hemorrhoids   . Osteopenia   . History of chicken pox    Past Surgical History  Procedure Laterality Date  . Breast surgery      cyst removal  . Appendectomy    . Abdominal hysterectomy    . Tonsillectomy    . Eus N/A 01/20/2013    Procedure: LOWER ENDOSCOPIC ULTRASOUND (EUS);  Surgeon: Milus Banister, MD;  Location: [facility];  Service: Endoscopy;  Laterality: N/A;  . Colonoscopy    . Breast cyst aspiration Bilateral     negative  . Colon surgery  06/28/2013    resected tumor from colon; [facility]  . Appendectomy  [id number]    Dr. Pat Patrick  . Abdominal hysterectomy  1980    partial  . Breast surgery  1960    Breast Biopsy  . Dilation and curettage of uterus  1957   Social History   Social History  . Marital Status: Widowed    Spouse Name: N/A  . Number of Children: 3  . Years of Education: N/A   Occupational History  . Unemployed     Homemaker   Social History Main Topics  . Smoking status: Current Every Day Smoker -- 0.75 packs/day for 64 years   Types: Cigarettes  . Smokeless tobacco: Never Used  . Alcohol Use: 0.0 oz/week    0 Standard drinks or equivalent per week     Comment: rarely  . Drug Use: No  . Sexual Activity: Not on file   Other Topics Concern  . Not on file   Social History Narrative   ** Merged History Encounter **       Social History   Social History Narrative   ** Merged History Encounter **         ROS: Negative.     PE: HEENT: Negative. Lungs: Clear. Cardio: RR. [name] 10/01/2015   Assessment/Plan:  Proceed with planned endoscopy.

## 2015-10-02 ENCOUNTER — Encounter: Payer: Self-pay | Admitting: General Surgery

## 2015-10-03 LAB — SURGICAL PATHOLOGY

## 2015-10-04 ENCOUNTER — Telehealth: Payer: Self-pay

## 2015-10-04 NOTE — Telephone Encounter (Signed)
-----   Message from [name], MD sent at 10/04/2015 10:51 AM EST ----- Please notify the patient that she did have one benign polyp, and that I recommend a follow up exam in three years. Please put in recalls. Thanks.  ----- Message -----    From: [facility]    Sent: 10/03/2015   3:37 PM      To: [name], MD

## 2015-10-07 NOTE — Telephone Encounter (Signed)
Notified patient as instructed, patient pleased. Discussed follow-up in 3 years, patient agrees. Patient placed in recalls.   

## 2015-10-16 ENCOUNTER — Encounter: Payer: Self-pay | Admitting: Physician Assistant

## 2015-10-16 ENCOUNTER — Ambulatory Visit (INDEPENDENT_AMBULATORY_CARE_PROVIDER_SITE_OTHER): Payer: Medicare Other | Admitting: Physician Assistant

## 2015-10-16 VITALS — BP 142/80 | HR 85 | Temp 97.9°F | Resp 14 | Wt 122.8 lb

## 2015-10-16 DIAGNOSIS — J0100 Acute maxillary sinusitis, unspecified: Secondary | ICD-10-CM | POA: Diagnosis not present

## 2015-10-16 DIAGNOSIS — H6501 Acute serous otitis media, right ear: Secondary | ICD-10-CM | POA: Diagnosis not present

## 2015-10-16 MED ORDER — AMOXICILLIN-POT CLAVULANATE 875-125 MG PO TABS: 1.0000 | ORAL_TABLET | Freq: Two times a day (BID) | ORAL | Status: DC

## 2015-10-16 NOTE — Progress Notes (Signed)
Patient: Emily [name] Female    DOB: [birth year]   81 y.o.   MRN: [id number] Visit Date: 10/16/2015  Today's Provider: Mar Daring, PA-C   Chief Complaint  Patient presents with  . Sore Throat   Subjective:    Sore Throat  This is a new problem. The current episode started in the past 7 days. The problem has been gradually worsening. The pain is worse on the right side. There has been no fever. Associated symptoms include congestion, coughing (mild), ear pain (right), headaches and a plugged ear sensation. Pertinent negatives include no shortness of breath, trouble swallowing or vomiting. She has tried gargles (Cloraseptic spray and Lozenges) for the symptoms. The treatment provided no relief.  She also notes that she has been having right ear pain as well. She states that she noticed her right ear bothering her more last night. She has not had any drainage from the ear. She denies any fevers, chills, nausea or vomiting.     Allergies  Allergen Reactions  . Amlodipine Besylate Other (See Comments)  . Oxycodone     confusion   Previous Medications   MELATONIN 2.5 MG CAPS    Take by mouth.   METOPROLOL SUCCINATE (TOPROL-XL) 25 MG 24 HR TABLET    TAKE ONE-HALF TABLET BY MOUTH ONCE DAILY* NEEDS OFFICE VISIT*   MULTIPLE VITAMINS-MINERALS (MULTIVITAMIN ADULT PO)    Take 1 tablet by mouth daily.   NUTRITIONAL SUPPLEMENTS (ENSURE PLUS PO)    Take by mouth.    Review of Systems  Constitutional: Negative for fever, chills and fatigue.  HENT: Positive for congestion, ear pain (right), postnasal drip, rhinorrhea, sinus pressure, sneezing and sore throat. Negative for hearing loss, tinnitus, trouble swallowing and voice change.   Respiratory: Positive for cough (mild). Negative for chest tightness, shortness of breath and wheezing.   Cardiovascular: Negative.   Gastrointestinal: Negative for nausea and vomiting.  Neurological: Positive for headaches. Negative for  dizziness.    Social History  Substance Use Topics  . Smoking status: Current Every Day Smoker -- 0.75 packs/day for 64 years    Types: Cigarettes  . Smokeless tobacco: Never Used  . Alcohol Use: 0.0 oz/week    0 Standard drinks or equivalent per week     Comment: rarely   Objective:   BP 142/80 mmHg  Pulse 85  Temp(Src) 97.9 F (36.6 C) (Oral)  Resp 14  Wt 122 lb 12.8 oz (55.702 kg)  Physical Exam  Constitutional: She appears well-developed and well-nourished. No distress.  HENT:  Head: Normocephalic and atraumatic.  Right Ear: Hearing, external ear and ear canal normal. Tympanic membrane is bulging. Tympanic membrane is not erythematous. A middle ear effusion (serous) is present.  Left Ear: Hearing, external ear and ear canal normal. Tympanic membrane is not erythematous and not bulging. A middle ear effusion (serous) is present.  Nose: Mucosal edema and rhinorrhea present. Right sinus exhibits maxillary sinus tenderness and frontal sinus tenderness. Left sinus exhibits maxillary sinus tenderness and frontal sinus tenderness.  Mouth/Throat: Uvula is midline and mucous membranes are normal. Posterior oropharyngeal erythema (mild) present. No oropharyngeal exudate or posterior oropharyngeal edema.    Neck: Normal range of motion. Neck supple. No tracheal deviation present. No thyromegaly present.  Cardiovascular: Normal rate, regular rhythm and normal heart sounds.  Exam reveals no gallop and no friction rub.   No murmur heard. Pulmonary/Chest: Effort normal and breath sounds normal. No stridor. No respiratory distress. She  has no wheezes. She has no rales.  Lymphadenopathy:    She has no cervical adenopathy.  Skin: She is not diaphoretic.  Vitals reviewed.       Assessment & Plan:     1. Acute maxillary sinusitis, recurrence not specified  worsening 1 week. She has been trying over-the-counter Chloraseptic spray and lozenges without relief. I will treat below with  Augmentin for the sinusitis as well as the right otitis media. I did advise her to continue to try to stay well-hydrated and to get plenty of rest. She is to call the office if she does not have any improvement with symptoms within 10 days. - amoxicillin-clavulanate (AUGMENTIN) 875-125 MG tablet; Take 1 tablet by mouth 2 (two) times daily.  Dispense: 20 tablet; Refill: 0  2. Right acute serous otitis media, recurrence not specified Right ear pain started last night. Mrs. Been a new onset and she feels like her right ear is stuffed full. She denies any dizziness. This may also because in some of her right-sided throat pain as there was not much indication for pharyngitis other than some mild erythema most likely secondary to postnasal drainage. I will treat as below with Augmentin. She is to call the office if she does not have any improvements within 10 days. - amoxicillin-clavulanate (AUGMENTIN) 875-125 MG tablet; Take 1 tablet by mouth 2 (two) times daily.  Dispense: 20 tablet; Refill: 0       Mar Daring, PA-C  [facility] [facility]

## 2015-10-16 NOTE — Patient Instructions (Signed)
Sinusitis, Adult Sinusitis is redness, soreness, and inflammation of the paranasal sinuses. Paranasal sinuses are air pockets within the bones of your face. They are located beneath your eyes, in the middle of your forehead, and above your eyes. In healthy paranasal sinuses, mucus is able to drain out, and air is able to circulate through them by way of your nose. However, when your paranasal sinuses are inflamed, mucus and air can become trapped. This can allow bacteria and other germs to grow and cause infection. Sinusitis can develop quickly and last only a short time (acute) or continue over a long period (chronic). Sinusitis that lasts for more than 12 weeks is considered chronic. CAUSES Causes of sinusitis include:  Allergies.  Structural abnormalities, such as displacement of the cartilage that separates your nostrils (deviated septum), which can decrease the air flow through your nose and sinuses and affect sinus drainage.  Functional abnormalities, such as when the small hairs (cilia) that line your sinuses and help remove mucus do not work properly or are not present. SIGNS AND SYMPTOMS Symptoms of acute and chronic sinusitis are the same. The primary symptoms are pain and pressure around the affected sinuses. Other symptoms include:  Upper toothache.  Earache.  Headache.  Bad breath.  Decreased sense of smell and taste.  A cough, which worsens when you are lying flat.  Fatigue.  Fever.  Thick drainage from your nose, which often is green and may contain pus (purulent).  Swelling and warmth over the affected sinuses. DIAGNOSIS Your health care provider will perform a physical exam. During your exam, your health care provider may perform any of the following to help determine if you have acute sinusitis or chronic sinusitis:  Look in your nose for signs of abnormal growths in your nostrils (nasal polyps).  Tap over the affected sinus to check for signs of  infection.  View the inside of your sinuses using an imaging device that has a light attached (endoscope). If your health care provider suspects that you have chronic sinusitis, one or more of the following tests may be recommended:  Allergy tests.  Nasal culture. A sample of mucus is taken from your nose, sent to a lab, and screened for bacteria.  Nasal cytology. A sample of mucus is taken from your nose and examined by your health care provider to determine if your sinusitis is related to an allergy. TREATMENT Most cases of acute sinusitis are related to a viral infection and will resolve on their own within 10 days. Sometimes, medicines are prescribed to help relieve symptoms of both acute and chronic sinusitis. These may include pain medicines, decongestants, nasal steroid sprays, or saline sprays. However, for sinusitis related to a bacterial infection, your health care provider will prescribe antibiotic medicines. These are medicines that will help kill the bacteria causing the infection. Rarely, sinusitis is caused by a fungal infection. In these cases, your health care provider will prescribe antifungal medicine. For some cases of chronic sinusitis, surgery is needed. Generally, these are cases in which sinusitis recurs more than 3 times per year, despite other treatments. HOME CARE INSTRUCTIONS  Drink plenty of water. Water helps thin the mucus so your sinuses can drain more easily.  Use a humidifier.  Inhale steam 3-4 times a day (for example, sit in the bathroom with the shower running).  Apply a warm, moist washcloth to your face 3-4 times a day, or as directed by your health care provider.  Use saline nasal sprays to help   moisten and clean your sinuses.  Take medicines only as directed by your health care provider.  If you were prescribed either an antibiotic or antifungal medicine, finish it all even if you start to feel better. SEEK IMMEDIATE MEDICAL CARE IF:  You have  increasing pain or severe headaches.  You have nausea, vomiting, or drowsiness.  You have swelling around your face.  You have vision problems.  You have a stiff neck.  You have difficulty breathing.   This information is not intended to replace advice given to you by your health care provider. Make sure you discuss any questions you have with your health care provider.   Document Released: 10/19/2005 Document Revised: 11/09/2014 Document Reviewed: 11/03/2011 Elsevier Interactive Patient Education 2016 [employer]. Otitis Media, Adult Otitis media is redness, soreness, and inflammation of the middle ear. Otitis media may be caused by allergies or, most commonly, by infection. Often it occurs as a complication of the common cold. SIGNS AND SYMPTOMS Symptoms of otitis media may include:  Earache.  Fever.  Ringing in your ear.  Headache.  Leakage of fluid from the ear. DIAGNOSIS To diagnose otitis media, your health care provider will examine your ear with an otoscope. This is an instrument that allows your health care provider to see into your ear in order to examine your eardrum. Your health care provider also will ask you questions about your symptoms. TREATMENT  Typically, otitis media resolves on its own within 3-5 days. Your health care provider may prescribe medicine to ease your symptoms of pain. If otitis media does not resolve within 5 days or is recurrent, your health care provider may prescribe antibiotic medicines if he or she suspects that a bacterial infection is the cause. HOME CARE INSTRUCTIONS   If you were prescribed an antibiotic medicine, finish it all even if you start to feel better.  Take medicines only as directed by your health care provider.  Keep all follow-up visits as directed by your health care provider. SEEK MEDICAL CARE IF:  You have otitis media only in one ear, or bleeding from your nose, or both.  You notice a lump on your neck.  You  are not getting better in 3-5 days.  You feel worse instead of better. SEEK IMMEDIATE MEDICAL CARE IF:   You have pain that is not controlled with medicine.  You have swelling, redness, or pain around your ear or stiffness in your neck.  You notice that part of your face is paralyzed.  You notice that the bone behind your ear (mastoid) is tender when you touch it. MAKE SURE YOU:   Understand these instructions.  Will watch your condition.  Will get help right away if you are not doing well or get worse.   This information is not intended to replace advice given to you by your health care provider. Make sure you discuss any questions you have with your health care provider.   Document Released: 07/24/2004 Document Revised: 11/09/2014 Document Reviewed: 05/16/2013 Elsevier Interactive Patient Education [employer].

## 2015-11-01 ENCOUNTER — Other Ambulatory Visit: Payer: Self-pay | Admitting: Physician Assistant

## 2015-11-01 DIAGNOSIS — J029 Acute pharyngitis, unspecified: Secondary | ICD-10-CM

## 2015-11-01 MED ORDER — MOXIFLOXACIN HCL 400 MG PO TABS: 400.0000 mg | ORAL_TABLET | Freq: Every day | ORAL | Status: DC

## 2015-11-18 ENCOUNTER — Ambulatory Visit: Payer: Self-pay | Admitting: Oncology

## 2015-11-18 ENCOUNTER — Other Ambulatory Visit: Payer: Self-pay

## 2015-11-25 ENCOUNTER — Ambulatory Visit (INDEPENDENT_AMBULATORY_CARE_PROVIDER_SITE_OTHER): Payer: Medicare Other | Admitting: Family Medicine

## 2015-11-25 ENCOUNTER — Encounter: Payer: Self-pay | Admitting: Family Medicine

## 2015-11-25 VITALS — BP 154/64 | HR 91 | Temp 97.5°F | Resp 20 | Wt 120.0 lb

## 2015-11-25 DIAGNOSIS — R070 Pain in throat: Secondary | ICD-10-CM

## 2015-11-25 DIAGNOSIS — R599 Enlarged lymph nodes, unspecified: Secondary | ICD-10-CM | POA: Diagnosis not present

## 2015-11-25 DIAGNOSIS — J309 Allergic rhinitis, unspecified: Secondary | ICD-10-CM | POA: Diagnosis not present

## 2015-11-25 DIAGNOSIS — R1310 Dysphagia, unspecified: Secondary | ICD-10-CM

## 2015-11-25 DIAGNOSIS — R591 Generalized enlarged lymph nodes: Secondary | ICD-10-CM

## 2015-11-25 NOTE — Patient Instructions (Signed)
Take OTC Zyrtec (cetirizine) 10 mg once a evening for allergies and sinus drainage

## 2015-11-25 NOTE — Progress Notes (Signed)
Patient: Emily [name] Female    DOB: March 02, 1933   82 y.o.   MRN: [id number] Visit Date: 11/25/2015  Today's Provider: Lelon Huh, MD   Chief Complaint  Patient presents with  . Sore Throat    1 month   Subjective:    Sore Throat  This is a recurrent problem. The current episode started more than 1 month ago (started around Thanksgiving). The problem has been unchanged. There has been no fever. The pain is moderate. Associated symptoms include congestion, coughing, ear discharge, ear pain (right ear), headaches, a hoarse voice, neck pain, swollen glands and trouble swallowing. Pertinent negatives include no abdominal pain, diarrhea, shortness of breath or vomiting.  Hurts to swallow. Patient was seen by Lillia Abed on 10/16/2015 for sinusitis and otitis Media. Patient was prescribed Augmentin. When she didn't improve, Avelox was called in on 11/01/2015. Patient comes in today stating symptoms are no better. Has had persistent nasal drip. Rare cough non-productive. She is also having trouble swallowing food.. Has also had a swollen lymph node on left.      Allergies  Allergen Reactions  . Amlodipine Besylate Other (See Comments)  . Oxycodone     confusion   Previous Medications   MELATONIN 2.5 MG CAPS    Take by mouth.   METOPROLOL SUCCINATE (TOPROL-XL) 25 MG 24 HR TABLET    TAKE ONE-HALF TABLET BY MOUTH ONCE DAILY* NEEDS OFFICE VISIT*   MULTIPLE VITAMINS-MINERALS (MULTIVITAMIN ADULT PO)    Take 1 tablet by mouth daily.   NUTRITIONAL SUPPLEMENTS (ENSURE PLUS PO)    Take by mouth.    Review of Systems  Constitutional: Negative for fever, chills, appetite change and fatigue.  HENT: Positive for congestion, ear discharge, ear pain (right ear), hoarse voice, nosebleeds, postnasal drip, sore throat and trouble swallowing.   Respiratory: Positive for cough. Negative for chest tightness and shortness of breath.   Cardiovascular: Negative for chest pain and  palpitations.  Gastrointestinal: Negative for nausea, vomiting, abdominal pain and diarrhea.  Musculoskeletal: Positive for neck pain.  Neurological: Positive for headaches. Negative for dizziness and weakness.    Social History  Substance Use Topics  . Smoking status: Current Every Day Smoker -- 0.75 packs/day for 64 years    Types: Cigarettes  . Smokeless tobacco: Never Used  . Alcohol Use: 0.0 oz/week    0 Standard drinks or equivalent per week     Comment: rarely   Objective:   BP 154/64 mmHg  Pulse 91  Temp(Src) 97.5 F (36.4 C) (Oral)  Resp 20  Wt 120 lb (54.432 kg)  SpO2 97%  Physical Exam  General Appearance:    Alert, cooperative, no distress  HENT:  3/4 cm firm non-tender anterior cervical lymph node on right. No other lymph nodes palpated.  bilateral TM normal without fluid or infection, sinuses nontender and nasal mucosa pale and congested. Slightly erythematous LEFT tonsiilar pillar.   Eyes:    PERRL, conjunctiva/corneas clear, EOM's intact       Lungs:     Clear to auscultation bilaterally, respirations unlabored  Heart:    Regular rate and rhythm  Neurologic:   Awake, alert, oriented x 3. No apparent focal neurological           defect.           Assessment & Plan:     1. Throat pain Nearly 2 months induration with no improvement since completing Augmentin and Avelox. Possibly secondary to  allergies. No other reflux symptoms. Start OTC antihistamine as below and refer ENT for further evaluation due to dysphagia and non-tender unilateral lymph node  2. Lymphadenopathy of head and neck  - Ambulatory referral to ENT  3. Difficulty swallowing   4. Allergic rhinitis, unspecified allergic rhinitis type Patient Instructions  Take OTC Zyrtec (cetirizine) 10 mg once a evening for allergies and sinus drainage           Lelon Huh, MD  [facility]

## 2015-11-27 ENCOUNTER — Telehealth: Payer: Self-pay | Admitting: Family Medicine

## 2015-11-27 DIAGNOSIS — R070 Pain in throat: Secondary | ICD-10-CM | POA: Insufficient documentation

## 2015-11-27 NOTE — Telephone Encounter (Signed)
Can you please see if anyone can see her at [facility]. She is having more trouble swallowing due to severe throat pain. Thanks.

## 2015-11-27 NOTE — Telephone Encounter (Signed)
Pt's son called saying his mom was referred to [facility] and he called to get an appt but the first appt is in February.  He says his mom can not swallow so she is not eating .  He wants her to be seen sooner than Feb.  [name] said if we called and referred her they could probably see her sooner.    The son's call back is   [phone]   Thanks Con Memos

## 2015-12-05 DIAGNOSIS — R490 Dysphonia: Secondary | ICD-10-CM | POA: Diagnosis not present

## 2015-12-05 DIAGNOSIS — B379 Candidiasis, unspecified: Secondary | ICD-10-CM | POA: Diagnosis not present

## 2015-12-05 DIAGNOSIS — D3705 Neoplasm of uncertain behavior of pharynx: Secondary | ICD-10-CM | POA: Diagnosis not present

## 2015-12-05 DIAGNOSIS — R070 Pain in throat: Secondary | ICD-10-CM | POA: Diagnosis not present

## 2015-12-09 ENCOUNTER — Encounter
Admission: RE | Admit: 2015-12-09 | Discharge: 2015-12-09 | Disposition: A | Discharge status: Home / Self Care | Payer: Medicare Other | Source: Ambulatory Visit | Attending: Otolaryngology | Admitting: Otolaryngology

## 2015-12-09 DIAGNOSIS — Z85048 Personal history of other malignant neoplasm of rectum, rectosigmoid junction, and anus: Secondary | ICD-10-CM | POA: Diagnosis not present

## 2015-12-09 DIAGNOSIS — M1990 Unspecified osteoarthritis, unspecified site: Secondary | ICD-10-CM | POA: Diagnosis not present

## 2015-12-09 DIAGNOSIS — Z9049 Acquired absence of other specified parts of digestive tract: Secondary | ICD-10-CM | POA: Diagnosis not present

## 2015-12-09 DIAGNOSIS — I10 Essential (primary) hypertension: Secondary | ICD-10-CM | POA: Diagnosis not present

## 2015-12-09 DIAGNOSIS — C12 Malignant neoplasm of pyriform sinus: Secondary | ICD-10-CM | POA: Diagnosis not present

## 2015-12-09 DIAGNOSIS — K219 Gastro-esophageal reflux disease without esophagitis: Secondary | ICD-10-CM | POA: Diagnosis not present

## 2015-12-09 DIAGNOSIS — F17200 Nicotine dependence, unspecified, uncomplicated: Secondary | ICD-10-CM | POA: Diagnosis not present

## 2015-12-09 DIAGNOSIS — Z888 Allergy status to other drugs, medicaments and biological substances status: Secondary | ICD-10-CM | POA: Diagnosis not present

## 2015-12-09 DIAGNOSIS — Z885 Allergy status to narcotic agent status: Secondary | ICD-10-CM | POA: Diagnosis not present

## 2015-12-09 DIAGNOSIS — D491 Neoplasm of unspecified behavior of respiratory system: Secondary | ICD-10-CM | POA: Diagnosis present

## 2015-12-09 DIAGNOSIS — Z79899 Other long term (current) drug therapy: Secondary | ICD-10-CM | POA: Diagnosis not present

## 2015-12-09 LAB — BASIC METABOLIC PANEL
ANION GAP: 6 (ref 5–15)
BUN: 25 mg/dL — ABNORMAL HIGH (ref 6–20)
CALCIUM: 10.3 mg/dL (ref 8.9–10.3)
CO2: 29 mmol/L (ref 22–32)
Chloride: 106 mmol/L (ref 101–111)
Creatinine, Ser: 1.01 mg/dL — ABNORMAL HIGH (ref 0.44–1.00)
GFR, EST AFRICAN AMERICAN: 59 mL/min — AB (ref >60)
GFR, EST NON AFRICAN AMERICAN: 51 mL/min — AB (ref >60)
GLUCOSE: 122 mg/dL — AB (ref 65–99)
POTASSIUM: 4 mmol/L (ref 3.5–5.1)
Sodium: 141 mmol/L (ref 135–145)

## 2015-12-09 LAB — CBC
HEMATOCRIT: 37.2 % (ref 35.0–47.0)
Hemoglobin: 12.3 g/dL (ref 12.0–16.0)
MCH: 30.4 pg (ref 26.0–34.0)
MCHC: 33.1 g/dL (ref 32.0–36.0)
MCV: 92 fL (ref 80.0–100.0)
PLATELETS: 212 10*3/uL (ref 150–440)
RBC: 4.05 MIL/uL (ref 3.80–5.20)
RDW: 14.3 % (ref 11.5–14.5)
WBC: 7.4 10*3/uL (ref 3.6–11.0)

## 2015-12-09 NOTE — Pre-Procedure Instructions (Signed)
SEPTAL INFARCT CITED PRIOR TO 2009 EKG

## 2015-12-09 NOTE — Patient Instructions (Signed)
  Your procedure is scheduled on: Wednesday 12/11/15 Report to Day Surgery. 2ND FLOOR MEDICAL MALL ENTRANCE To find out your arrival time please call [phone] between 1PM - 3PM on Tuesday 12/10/15.  Remember: Instructions that are not followed completely may result in serious medical risk, up to and including death, or upon the discretion of your surgeon and anesthesiologist your surgery may need to be rescheduled.    __X__ 1. Do not eat food or drink liquids after midnight. No gum chewing or hard candies.     __X__ 2. No Alcohol for 24 hours before or after surgery.   ____ 3. Bring all medications with you on the day of surgery if instructed.    __X__ 4. Notify your doctor if there is any change in your medical condition     (cold, fever, infections).     Do not wear jewelry, make-up, hairpins, clips or nail polish.  Do not wear lotions, powders, or perfumes. You may wear deodorant.  Do not shave 48 hours prior to surgery. Men may shave face and neck.  Do not bring valuables to the hospital.    [facility] is not responsible for any belongings or valuables.               Contacts, dentures or bridgework may not be worn into surgery.  Leave your suitcase in the car. After surgery it may be brought to your room.  For patients admitted to the hospital, discharge time is determined by your                treatment team.   Patients discharged the day of surgery will not be allowed to drive home.   Please read over the following fact sheets that you were given:   Surgical Site Infection Prevention   __X__ Take these medicines the morning of surgery with A SIP OF WATER:    1. METOPROLOL  2.   3.   4.  5.  6.  ____ Fleet Enema (as directed)   ____ Use CHG Soap as directed  ____ Use inhalers on the day of surgery  ____ Stop metformin 2 days prior to surgery    ____ Take 1/2 of usual insulin dose the night before surgery and none on the morning of surgery.   ____ Stop  Coumadin/Plavix/aspirin on   ____ Stop Anti-inflammatories on STOP ANY NSAIDS (ADVIL, ALEVE, MOTRIN, IBUPROFEN)   __X__ Stop supplements until after surgery.  NO MELATONIN UNTIL AFTER SURGERY  ____ Bring C-Pap to the hospital.

## 2015-12-09 NOTE — Pre-Procedure Instructions (Signed)
Met B faxed to Dr. Richardson Landry

## 2015-12-11 ENCOUNTER — Ambulatory Visit: Payer: Medicare Other | Admitting: Certified Registered"

## 2015-12-11 ENCOUNTER — Ambulatory Visit
Admission: RE | Admit: 2015-12-11 | Discharge: 2015-12-11 | Disposition: A | Discharge status: Home / Self Care | Payer: Medicare Other | Source: Ambulatory Visit | Attending: Otolaryngology | Admitting: Otolaryngology

## 2015-12-11 ENCOUNTER — Encounter: Payer: Self-pay | Admitting: *Deleted

## 2015-12-11 ENCOUNTER — Encounter
Admission: RE | Disposition: A | Discharge status: Home / Self Care | Payer: Self-pay | Source: Ambulatory Visit | Attending: Otolaryngology

## 2015-12-11 DIAGNOSIS — C12 Malignant neoplasm of pyriform sinus: Secondary | ICD-10-CM | POA: Diagnosis not present

## 2015-12-11 DIAGNOSIS — F17200 Nicotine dependence, unspecified, uncomplicated: Secondary | ICD-10-CM | POA: Insufficient documentation

## 2015-12-11 DIAGNOSIS — I10 Essential (primary) hypertension: Secondary | ICD-10-CM | POA: Diagnosis not present

## 2015-12-11 DIAGNOSIS — Z888 Allergy status to other drugs, medicaments and biological substances status: Secondary | ICD-10-CM | POA: Insufficient documentation

## 2015-12-11 DIAGNOSIS — Z85048 Personal history of other malignant neoplasm of rectum, rectosigmoid junction, and anus: Secondary | ICD-10-CM | POA: Insufficient documentation

## 2015-12-11 DIAGNOSIS — Z885 Allergy status to narcotic agent status: Secondary | ICD-10-CM | POA: Insufficient documentation

## 2015-12-11 DIAGNOSIS — J387 Other diseases of larynx: Secondary | ICD-10-CM | POA: Diagnosis not present

## 2015-12-11 DIAGNOSIS — K219 Gastro-esophageal reflux disease without esophagitis: Secondary | ICD-10-CM | POA: Insufficient documentation

## 2015-12-11 DIAGNOSIS — Z9049 Acquired absence of other specified parts of digestive tract: Secondary | ICD-10-CM | POA: Insufficient documentation

## 2015-12-11 DIAGNOSIS — M1990 Unspecified osteoarthritis, unspecified site: Secondary | ICD-10-CM | POA: Insufficient documentation

## 2015-12-11 DIAGNOSIS — Z79899 Other long term (current) drug therapy: Secondary | ICD-10-CM | POA: Insufficient documentation

## 2015-12-11 HISTORY — PX: ESOPHAGOSCOPY: SHX5534

## 2015-12-11 HISTORY — PX: DIRECT LARYNGOSCOPY: SHX5326

## 2015-12-11 SURGERY — LARYNGOSCOPY, DIRECT
Anesthesia: General

## 2015-12-11 MED ORDER — HYDROCODONE-ACETAMINOPHEN 7.5-325 MG/15ML PO SOLN: ORAL | Status: DC

## 2015-12-11 MED ORDER — DEXAMETHASONE SODIUM PHOSPHATE 4 MG/ML IJ SOLN
INTRAMUSCULAR | Status: DC | PRN
  Administered 2015-12-11: 6 mg via INTRAVENOUS

## 2015-12-11 MED ORDER — METOPROLOL SUCCINATE ER 25 MG PO TB24
12.5000 mg | ORAL_TABLET | Freq: Every day | ORAL | Status: DC
  Administered 2015-12-11: 12.5 mg via ORAL
  Filled 2015-12-11 (×2): qty 0.5

## 2015-12-11 MED ORDER — FAMOTIDINE 20 MG PO TABS
20.0000 mg | ORAL_TABLET | Freq: Once | ORAL | Status: AC
  Administered 2015-12-11: 20 mg via ORAL

## 2015-12-11 MED ORDER — LIDOCAINE HCL (CARDIAC) 20 MG/ML IV SOLN
INTRAVENOUS | Status: DC | PRN
  Administered 2015-12-11: 50 mg via INTRAVENOUS

## 2015-12-11 MED ORDER — SUCCINYLCHOLINE CHLORIDE 20 MG/ML IJ SOLN
INTRAMUSCULAR | Status: DC | PRN
  Administered 2015-12-11: 80 mg via INTRAVENOUS

## 2015-12-11 MED ORDER — FENTANYL CITRATE (PF) 100 MCG/2ML IJ SOLN
25.0000 ug | INTRAMUSCULAR | Status: DC | PRN
  Administered 2015-12-11 (×4): 25 ug via INTRAVENOUS

## 2015-12-11 MED ORDER — FENTANYL CITRATE (PF) 100 MCG/2ML IJ SOLN
INTRAMUSCULAR | Status: DC | PRN
  Administered 2015-12-11: 25 ug via INTRAVENOUS
  Administered 2015-12-11: 50 ug via INTRAVENOUS

## 2015-12-11 MED ORDER — FENTANYL CITRATE (PF) 100 MCG/2ML IJ SOLN
INTRAMUSCULAR | Status: AC
  Filled 2015-12-11: qty 2

## 2015-12-11 MED ORDER — LIDOCAINE HCL 2 % EX GEL
CUTANEOUS | Status: DC | PRN
  Administered 2015-12-11: 1 via TOPICAL

## 2015-12-11 MED ORDER — ONDANSETRON HCL 4 MG/2ML IJ SOLN: 4.0000 mg | Freq: Once | INTRAMUSCULAR | Status: DC | PRN

## 2015-12-11 MED ORDER — LACTATED RINGERS IV SOLN
INTRAVENOUS | Status: DC
  Administered 2015-12-11: 09:00:00 via INTRAVENOUS

## 2015-12-11 MED ORDER — ONDANSETRON HCL 4 MG/2ML IJ SOLN
INTRAMUSCULAR | Status: DC | PRN
  Administered 2015-12-11: 4 mg via INTRAVENOUS

## 2015-12-11 MED ORDER — PROPOFOL 10 MG/ML IV BOLUS
INTRAVENOUS | Status: DC | PRN
  Administered 2015-12-11: 30 mg via INTRAVENOUS
  Administered 2015-12-11: 120 mg via INTRAVENOUS

## 2015-12-11 MED ORDER — FAMOTIDINE 20 MG PO TABS
ORAL_TABLET | ORAL | Status: AC
  Administered 2015-12-11: 20 mg via ORAL
  Filled 2015-12-11: qty 1

## 2015-12-11 MED ORDER — OXYMETAZOLINE HCL 0.05 % NA SOLN
NASAL | Status: DC | PRN
  Administered 2015-12-11: 1

## 2015-12-11 SURGICAL SUPPLY — 21 items
CANISTER SUCT 1200ML W/VALVE (MISCELLANEOUS) ×3 IMPLANT
DRAPE SHEET LG 3/4 BI-LAMINATE (DRAPES) ×3 IMPLANT
DRESSING TELFA 4X3 1S ST N-ADH (GAUZE/BANDAGES/DRESSINGS) ×3 IMPLANT
GLOVE BIO SURGEON STRL SZ7.5 (GLOVE) ×3 IMPLANT
GOWN STRL REUS W/ TWL LRG LVL3 (GOWN DISPOSABLE) ×1 IMPLANT
GOWN STRL REUS W/TWL LRG LVL3 (GOWN DISPOSABLE) ×2
IV SET EXTENSION MINI BORE EPI (IV SETS) IMPLANT
KIT RM TURNOVER STRD PROC AR (KITS) ×3 IMPLANT
LABEL OR SOLS (LABEL) IMPLANT
NDL ENDOSCOPIC URO 20G (NEEDLE) ×3 IMPLANT
NEEDLE FILTER BLUNT 18X 1/2SAF (NEEDLE) ×2
NEEDLE FILTER BLUNT 18X1 1/2 (NEEDLE) ×1 IMPLANT
PACK HEAD/NECK (MISCELLANEOUS) ×3 IMPLANT
PATTIES SURGICAL .5 X.5 (GAUZE/BANDAGES/DRESSINGS) ×3 IMPLANT
SOL ANTI-FOG 6CC FOG-OUT (MISCELLANEOUS) ×1 IMPLANT
SOL FOG-OUT ANTI-FOG 6CC (MISCELLANEOUS) ×2
SPONGE XRAY 4X4 16PLY STRL (MISCELLANEOUS) ×3 IMPLANT
SURGILUBE 2OZ TUBE FLIPTOP (MISCELLANEOUS) ×3 IMPLANT
TOWEL OR 17X26 4PK STRL BLUE (TOWEL DISPOSABLE) ×3 IMPLANT
TUBING CONNECTING 10 (TUBING) ×2 IMPLANT
TUBING CONNECTING 10' (TUBING) ×1

## 2015-12-11 NOTE — Discharge Instructions (Signed)
AMBULATORY SURGERY  DISCHARGE INSTRUCTIONS   1) The drugs that you were given will stay in your system until tomorrow so for the next 24 hours you should not:  A) Drive an automobile B) Make any legal decisions C) Drink any alcoholic beverage   2) You may resume regular meals tomorrow.  Today it is better to start with liquids and gradually work up to solid foods.  You may eat anything you prefer, but it is better to start with liquids, then soup and crackers, and gradually work up to solid foods.   3) Please notify your doctor immediately if you have any unusual bleeding, trouble breathing, redness and pain at the surgery site, drainage, fever, or pain not relieved by medication.    4) Additional Instructions:        Please contact your physician with any problems or Same Day Surgery at [phone], Monday through Friday 6 am to 4 pm, or [place] [facility] number at [phone]

## 2015-12-11 NOTE — H&P (Signed)
History and physical reviewed and will be scanned in later. No change in medical status reported by the patient or family, appears stable for surgery. All questions regarding the procedure answered, and patient (or family if a child) expressed understanding of the procedure.  Emily [name] S @TODAY@ 

## 2015-12-11 NOTE — Anesthesia Preprocedure Evaluation (Signed)
Anesthesia Evaluation  Patient identified by MRN, date of birth, ID band  Reviewed: Allergy & Precautions, NPO status , Patient's Chart, lab work & pertinent test results, reviewed documented beta blocker date and time   Airway Mallampati: II  TM Distance: >3 FB     Dental  (+) Upper Dentures, Partial Lower   Pulmonary Current Smoker,    Pulmonary exam normal breath sounds clear to auscultation       Cardiovascular hypertension, Pt. on medications and Pt. on home beta blockers Normal cardiovascular exam     Neuro/Psych negative neurological ROS  negative psych ROS   GI/Hepatic Neg liver ROS, GERD  Medicated and Controlled,Rectal CA   Endo/Other  negative endocrine ROS  Renal/GU negative Renal ROS  negative genitourinary   Musculoskeletal  (+) Arthritis , Osteoarthritis,    Abdominal Normal abdominal exam  (+)   Peds negative pediatric ROS (+)  Hematology negative hematology ROS (+)   Anesthesia Other Findings   Reproductive/Obstetrics                             Anesthesia Physical Anesthesia Plan  ASA: III  Anesthesia Plan: General   Post-op Pain Management:    Induction: Intravenous  Airway Management Planned: Oral ETT  Additional Equipment:   Intra-op Plan:   Post-operative Plan: Extubation in OR  Informed Consent: I have reviewed the patients History and Physical, chart, labs and discussed the procedure including the risks, benefits and alternatives for the proposed anesthesia with the patient or authorized representative who has indicated his/her understanding and acceptance.   Dental advisory given  Plan Discussed with: CRNA and Surgeon  Anesthesia Plan Comments:         Anesthesia Quick Evaluation

## 2015-12-11 NOTE — Op Note (Signed)
12/11/2015  9:23 AM    Emily [name]  [id number]    Pre-Op Diagnosis:  Laryngeal mass  Post-op Diagnosis: Laryngeal mass  Procedure:  Direct Laryngoscopy with Biopsy, Rigid Esophagoscopy   Surgeon:  Riley Nearing  Anesthesia:  General Endotracheal  EBL:  Less than 25 cc  Complications:  None  Findings:  There was a mass involving the right lateral pharyngeal wall below the lower pole of the tonsil which extended into the piriform sinus, extending halfway down the piriform sinus and over on to the medial wall, including the right area epiglottic fold and arytenoid. In the piriform sinus the tumor had more of an endophytic appearance with significant friable tissue which was biopsied. The tumor did not extend into the endolarynx. The vocal cords were clear and there were no lesions in the subglottic region. The left piriform sinus and postcricoid region appeared free of disease. I was able to scope the upper half of the cervical esophagus and saw no lesions. It was difficult to pass the scope further into the esophagus, most likely due to spinal curvature, no mucosal lesions were seen in this area.  Procedure: With the patient in a comfortable supine position, general endotracheal anesthesia was induced without difficulty.  At an appropriate level, the table was turned 90 degrees away from Anesthesia.  A clean preparation and draping was performed in the standard fashion. The oropharynx, oral cavity, nasopharynx and hypopharynx were palpated with findings as described above.  Initially rigid esophagoscopy was performed, passing the esophagoscope carefully passed the tumor in the right. Form sinus and into the cervical esophagus. The scope was lubricated appropriately for easy passage. The esophagoscope passed easily into the cervical esophagus and I was able to pass it roughly halfway down the esophagus at which point some resistance was encountered although no mucosal lesions were  seen. This was most likely due to the curvature of the spine. I did not see a discrete stenosis. Decision was made not to pass the scope further to reduce risk of esophageal perforation. Direct laryngoscopy was performed for the purpose of obtaining biopsies of the piriform sinus. Using the Dedo laryngoscope, the oropharynx, hypopharynx and larynx were carefully inspected. Findings are as noted above. Photodocumentation was obtained.  Biopsies were taken from the right piriform sinus. Bleeding was controlled with Afrin moistened pledgets. The laryngoscope was removed.  At this point the procedure was completed.  Dental status was intact.  The patient was returned to Anesthesia, awakened, extubated, and transferred to PACU in satisfactory condition.   Disposition: To PACU, then discharge home  Plan: Soft, bland diet, advance as tolerated. Take pain medications as prescribed. Follow-up in 3 weeks.  Riley Nearing 12/11/2015 9:23 AM

## 2015-12-11 NOTE — Anesthesia Procedure Notes (Signed)
Procedure Name: Intubation Performed by: Rolla Plate Pre-anesthesia Checklist: Patient identified, Patient being monitored, Timeout performed, Emergency Drugs available and Suction available Patient Re-evaluated:Patient Re-evaluated prior to inductionOxygen Delivery Method: Circle system utilized Preoxygenation: Pre-oxygenation with 100% oxygen Intubation Type: IV induction Ventilation: Mask ventilation without difficulty Laryngoscope Size: Miller and 2 Grade View: Grade II Tube type: MLT Tube size: 6.0 mm Number of attempts: 1 Airway Equipment and Method: Stylet Placement Confirmation: ETT inserted through vocal cords under direct vision,  positive ETCO2 and breath sounds checked- equal and bilateral Secured at: 21 cm Tube secured with: Tape Dental Injury: Teeth and Oropharynx as per pre-operative assessment

## 2015-12-11 NOTE — Transfer of Care (Signed)
Immediate Anesthesia Transfer of Care Note  Patient: Emily [name]  Procedure(s) Performed: Procedure(s): DIRECT LARYNGOSCOPY (N/A) ESOPHAGOSCOPY (N/A)  Patient Location: PACU  Anesthesia Type:General  Level of Consciousness: awake  Airway & Oxygen Therapy: Patient Spontanous Breathing and Patient connected to face mask oxygen  Post-op Assessment: Report given to RN  Post vital signs: Reviewed  Last Vitals:  Filed Vitals:   12/11/15 0815 12/11/15 0930  BP: 152/70 160/72  Pulse: 87 81  Temp: 35.4 C 36.2 C  Resp: 16 17    Complications: No apparent anesthesia complications

## 2015-12-12 LAB — SURGICAL PATHOLOGY

## 2015-12-12 NOTE — Anesthesia Postprocedure Evaluation (Signed)
Anesthesia Post Note  Patient: Emily [name]  Procedure(s) Performed: Procedure(s) (LRB): DIRECT LARYNGOSCOPY (N/A) ESOPHAGOSCOPY (N/A)  Patient location during evaluation: PACU Anesthesia Type: General Level of consciousness: awake and alert and oriented Pain management: pain level controlled Vital Signs Assessment: post-procedure vital signs reviewed and stable Respiratory status: spontaneous breathing Cardiovascular status: blood pressure returned to baseline Anesthetic complications: no    Last Vitals:  Filed Vitals:   12/11/15 1017 12/11/15 1019  BP: 172/68 164/68  Pulse: 63 65  Temp:    Resp: 20 20    Last Pain:  Filed Vitals:   12/12/15 0929  PainSc: 2                  Kohle Winner

## 2015-12-13 ENCOUNTER — Other Ambulatory Visit: Payer: Self-pay | Admitting: Otolaryngology

## 2015-12-13 DIAGNOSIS — C300 Malignant neoplasm of nasal cavity: Secondary | ICD-10-CM

## 2015-12-19 ENCOUNTER — Ambulatory Visit
Admission: RE | Admit: 2015-12-19 | Discharge: 2015-12-19 | Disposition: A | Discharge status: Home / Self Care | Payer: Medicare Other | Source: Ambulatory Visit | Attending: Otolaryngology | Admitting: Otolaryngology

## 2015-12-19 DIAGNOSIS — I2510 Atherosclerotic heart disease of native coronary artery without angina pectoris: Secondary | ICD-10-CM | POA: Insufficient documentation

## 2015-12-19 DIAGNOSIS — I700 Atherosclerosis of aorta: Secondary | ICD-10-CM | POA: Diagnosis not present

## 2015-12-19 DIAGNOSIS — C300 Malignant neoplasm of nasal cavity: Secondary | ICD-10-CM | POA: Diagnosis not present

## 2015-12-19 LAB — GLUCOSE, CAPILLARY: GLUCOSE-CAPILLARY: 105 mg/dL — AB (ref 65–99)

## 2015-12-19 MED ORDER — FLUDEOXYGLUCOSE F - 18 (FDG) INJECTION
12.4700 | Freq: Once | INTRAVENOUS | Status: AC | PRN
  Administered 2015-12-19: 12.47 via INTRAVENOUS

## 2015-12-21 ENCOUNTER — Emergency Department
Admission: EM | Admit: 2015-12-21 | Discharge: 2015-12-21 | Disposition: A | Discharge status: Home / Self Care | Payer: Medicare Other | Attending: Emergency Medicine | Admitting: Emergency Medicine

## 2015-12-21 ENCOUNTER — Emergency Department: Payer: Medicare Other

## 2015-12-21 ENCOUNTER — Encounter: Payer: Self-pay | Admitting: Emergency Medicine

## 2015-12-21 DIAGNOSIS — C140 Malignant neoplasm of pharynx, unspecified: Secondary | ICD-10-CM | POA: Diagnosis not present

## 2015-12-21 DIAGNOSIS — F17210 Nicotine dependence, cigarettes, uncomplicated: Secondary | ICD-10-CM | POA: Diagnosis not present

## 2015-12-21 DIAGNOSIS — R042 Hemoptysis: Secondary | ICD-10-CM | POA: Insufficient documentation

## 2015-12-21 DIAGNOSIS — I10 Essential (primary) hypertension: Secondary | ICD-10-CM | POA: Diagnosis not present

## 2015-12-21 DIAGNOSIS — Z79899 Other long term (current) drug therapy: Secondary | ICD-10-CM | POA: Diagnosis not present

## 2015-12-21 LAB — COMPREHENSIVE METABOLIC PANEL
ALBUMIN: 3.5 g/dL (ref 3.5–5.0)
ALT: 28 U/L (ref 14–54)
ANION GAP: 3 — AB (ref 5–15)
AST: 28 U/L (ref 15–41)
Alkaline Phosphatase: 73 U/L (ref 38–126)
BUN: 24 mg/dL — ABNORMAL HIGH (ref 6–20)
CALCIUM: 10.2 mg/dL (ref 8.9–10.3)
CHLORIDE: 107 mmol/L (ref 101–111)
CO2: 31 mmol/L (ref 22–32)
Creatinine, Ser: 1.04 mg/dL — ABNORMAL HIGH (ref 0.44–1.00)
GFR calc non Af Amer: 49 mL/min — ABNORMAL LOW (ref >60)
GFR, EST AFRICAN AMERICAN: 57 mL/min — AB (ref >60)
GLUCOSE: 119 mg/dL — AB (ref 65–99)
POTASSIUM: 4 mmol/L (ref 3.5–5.1)
SODIUM: 141 mmol/L (ref 135–145)
Total Bilirubin: 0.5 mg/dL (ref 0.3–1.2)
Total Protein: 6.8 g/dL (ref 6.5–8.1)

## 2015-12-21 LAB — CBC WITH DIFFERENTIAL/PLATELET
BASOS PCT: 0 %
Basophils Absolute: 0 10*3/uL (ref 0–0.1)
EOS ABS: 0 10*3/uL (ref 0–0.7)
EOS PCT: 0 %
HCT: 36.5 % (ref 35.0–47.0)
HEMOGLOBIN: 12 g/dL (ref 12.0–16.0)
LYMPHS ABS: 0.9 10*3/uL — AB (ref 1.0–3.6)
Lymphocytes Relative: 10 %
MCH: 30.5 pg (ref 26.0–34.0)
MCHC: 33 g/dL (ref 32.0–36.0)
MCV: 92.5 fL (ref 80.0–100.0)
MONOS PCT: 7 %
Monocytes Absolute: 0.6 10*3/uL (ref 0.2–0.9)
NEUTROS PCT: 83 %
Neutro Abs: 6.9 10*3/uL — ABNORMAL HIGH (ref 1.4–6.5)
PLATELETS: 230 10*3/uL (ref 150–440)
RBC: 3.95 MIL/uL (ref 3.80–5.20)
RDW: 14.1 % (ref 11.5–14.5)
WBC: 8.4 10*3/uL (ref 3.6–11.0)

## 2015-12-21 NOTE — ED Notes (Signed)
Pt left AMA prior to final discussion with doctor.

## 2015-12-21 NOTE — ED Provider Notes (Signed)
[facility] Emergency Department Provider Note    ____________________________________________  Time seen: ~1625  I have reviewed the triage vital signs and the nursing notes.   HISTORY  Chief Complaint Hemoptysis   History limited by: Not Limited   HPI Emily [name] is a 82 y.o. female who presents to the emergency department today because of concerns for hemoptysis. The patient states that the hemoptysis started roughly 2 days ago. She came to the emergency department today because today was 1 family discovered this. Patient states that it is been bright red blood mixed in with phlegm. Patient denies any chest pain. She denies any shortness of breath. Importantly the patient underwent a biopsy of a pharyngeal mass 10 days ago. She states she had a little bit of bleeding after that. 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    Past Medical History  Diagnosis Date  . Status post chemotherapy     colon cancer  . Status post radiation therapy     colon cancer 2013  . Hypertension   . Diverticulosis   . Hemorrhoids   . Osteopenia   . History of chicken pox   . Cancer ([place])     rectal  . Cancer ([place])     colon cancer  . Arthritis     fingers    Patient Active Problem List   Diagnosis Date Noted  . Throat pain 11/27/2015  . History of colon cancer 05/09/2015  . Diverticulosis 05/09/2015  . Fatigue 05/09/2015  . Hemorrhoid 05/09/2015  . Hypertension 05/09/2015  . Insomnia 05/09/2015  . Osteopenia 05/09/2015  . Skin lesion of back 05/09/2015  . Tobacco abuse 05/09/2015  . Vitamin D deficiency 05/09/2015  . Ileostomy present ([place]) 09/27/2013  . Acid reflux 06/13/2013  . Arthritis, degenerative 06/13/2013  . Disuse syndrome 06/13/2013  . Rectal cancer ([place]) 01/20/2013    Past Surgical History  Procedure Laterality Date  . Appendectomy    . Abdominal hysterectomy    . Tonsillectomy    . Eus N/A 01/20/2013     Procedure: LOWER ENDOSCOPIC ULTRASOUND (EUS);  Surgeon: Milus Banister, MD;  Location: [facility];  Service: Endoscopy;  Laterality: N/A;  . Colonoscopy    . Colon surgery  06/28/2013    resected tumor [facility] colon; [facility]  . Appendectomy  [id number]    Dr. Pat Patrick  . Abdominal hysterectomy  1980    partial  . Dilation and curettage of uterus  1957  . Colonoscopy N/A 10/01/2015    Procedure: COLONOSCOPY;  Surgeon: [name], MD;  Location: [facility] ENDOSCOPY;  Service: Endoscopy;  Laterality: N/A;  . Breast surgery      cyst removal  . Breast cyst aspiration Bilateral     negative  . Breast surgery  1960    Breast Biopsy  . Direct laryngoscopy N/A 12/11/2015    Procedure: DIRECT LARYNGOSCOPY;  Surgeon: Clyde Canterbury, MD;  Location: [facility] ORS;  Service: ENT;  Laterality: N/A;  . Esophagoscopy N/A 12/11/2015    Procedure: ESOPHAGOSCOPY;  Surgeon: Clyde Canterbury, MD;  Location: [facility] ORS;  Service: ENT;  Laterality: N/A;    Current Outpatient Rx  Name  Route  Sig  Dispense  Refill  . HYDROcodone-acetaminophen (HYCET) 7.5-325 mg/15 ml solution      10-15 cc every 4-6 hours as needed for pain   300 mL   0   . Melatonin 2.5 MG CAPS   Oral   Take  1 capsule by mouth at bedtime as needed.          . metoprolol succinate (TOPROL-XL) 25 MG 24 hr tablet      TAKE ONE-HALF TABLET BY MOUTH ONCE DAILY* NEEDS OFFICE VISIT*   90 tablet   3   . Multiple Vitamins-Minerals (MULTIVITAMIN ADULT PO)   Oral   Take 1 tablet by mouth daily.         . Nutritional Supplements (ENSURE PLUS PO)   Oral   Take by mouth.           Allergies Amlodipine besylate and Oxycodone  Family History  Problem Relation Age of Onset  . Breast cancer Maternal Grandmother   . Kidney failure Father     Social History Social History  Substance Use Topics  . Smoking status: Current Every Day Smoker -- 0.50 packs/day for 64 years    Types: Cigarettes  . Smokeless tobacco: Never  Used  . Alcohol Use: 0.0 oz/week    0 Standard drinks or equivalent per week     Comment: rarely    Review of Systems  Constitutional: Negative for fever. Cardiovascular: Negative for chest pain. Respiratory: Negative for shortness of breath. Gastrointestinal: Negative for abdominal pain, vomiting and diarrhea. Neurological: Negative for headaches, focal weakness or numbness.   10-point ROS otherwise negative.  ____________________________________________   PHYSICAL EXAM:  VITAL SIGNS: ED Triage Vitals  Enc Vitals Group     BP 12/21/15 1516 186/75 mmHg     Pulse Rate 12/21/15 1516 69     Resp 12/21/15 1516 20     Temp 12/21/15 1516 98.4 F (36.9 C)     Temp Source 12/21/15 1516 Oral     SpO2 12/21/15 1516 94 %     Weight 12/21/15 1516 124 lb (56.246 kg)     Height 12/21/15 1516 5\' 5"  (1.651 m)     Head Cir --      Peak Flow --      Pain Score 12/21/15 1517 5   Constitutional: Alert and oriented. Well appearing and in no distress. Eyes: Conjunctivae are normal. PERRL. Normal extraocular movements. ENT   Head: Normocephalic and atraumatic.   Nose: No congestion/rhinnorhea.   Mouth/Throat: Mucous membranes are moist.   Neck: No stridor. Hematological/Lymphatic/Immunilogical: No cervical lymphadenopathy. Cardiovascular: Normal rate, regular rhythm.  No murmurs, rubs, or gallops. Respiratory: Normal respiratory effort without tachypnea nor retractions. Breath sounds are clear and equal bilaterally. No wheezes/rales/rhonchi. Gastrointestinal: Soft and nontender. No distention. There is no CVA tenderness. Genitourinary: Deferred Musculoskeletal: Normal range of motion in all extremities. No joint effusions.  No lower extremity tenderness nor edema. Neurologic:  Normal speech and language. No gross focal neurologic deficits are appreciated.  Skin:  Skin is warm, dry and intact. No rash noted. Psychiatric: Mood and affect are normal. Speech and behavior are  normal. Patient exhibits appropriate insight and judgment.  ____________________________________________    LABS (pertinent positives/negatives)  Labs Reviewed  CBC WITH DIFFERENTIAL/PLATELET - Abnormal; Notable for the following:    Neutro Abs 6.9 (*)    Lymphs Abs 0.9 (*)    All other components within normal limits  COMPREHENSIVE METABOLIC PANEL - Abnormal; Notable for the following:    Glucose, Bld 119 (*)    BUN 24 (*)    Creatinine, Ser 1.04 (*)    GFR calc non Af Amer 49 (*)    GFR calc Af Amer 57 (*)    Anion gap 3 (*)  All other components within normal limits    ____________________________________________   EKG  None  ____________________________________________    RADIOLOGY  CXR IMPRESSION: No acute pulmonary process.  ____________________________________________   PROCEDURES  Procedure(s) performed: None  Critical Care performed: No  ____________________________________________   INITIAL IMPRESSION / ASSESSMENT AND PLAN / ED COURSE  Pertinent labs & imaging results that were available during my care of the patient were reviewed by me and considered in my medical decision making (see chart for details).  Patient presented to the emergency department today because of concerns for hemoptysis. 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 Patient having a bright red blood mixed in with the phlegm. Blood work here without any concerning findings. Chest x-ray without any obvious mass or pneumonia. I discussed with the patient that it was possible that the blood could be due to the cancer or the biopsy itself. Furthermore I did discuss possibility of blood clot given history of cancer however given lack of shortness breath, chest pain or hypoxia or tachycardia think this less likely. I did discuss and offer CT scan however think reasonable to defer. I discussed with the family  patient that I would try to contact ENT to help arrange follow-up and discuss the current situation. I did discuss bleeding return precautions with the family however family and patient left prior to my final discussion and prior to them receiving paperwork and prior to me being able to get in touch with ENT. They do have follow-up scheduled in 2 days with oncology.  ____________________________________________   FINAL CLINICAL IMPRESSION(S) / ED DIAGNOSES  Final diagnoses:  Hemoptysis     Nance Pear, MD 12/21/15 1851

## 2015-12-21 NOTE — Discharge Instructions (Signed)
Please seek medical attention for any high fevers, chest pain, shortness of breath, change in behavior, persistent vomiting, bloody stool or any other new or concerning symptoms.   Hemoptysis Hemoptysis means coughing up blood. The blood may come from the lungs and airways. It can also come from bleeding that occurs outside the lungs and airways. Coughing up blood can be a sign of a minor problem or a serious medical condition.  HOME CARE  Only take medicine as told by your doctor. Do not use medicines that help you stop coughing (cough suppressants) unless your doctor approves.  If you are given antibiotic medicine, take it as told. Finish it even if you start to feel better.  Do not smoke. Also avoid being around others when they are smoking.  Follow up with your doctor as told. GET HELP RIGHT AWAY IF:  You cough up bloody spit (mucus) for longer than a week.  You have a blood-producing cough that is severe or getting worse.  You have a blood-producing cough thatcomes and goes over time.  You have trouble breathing.   You throw up (vomit) blood.  You have bloody or black poop (stool).  You have chest pain.   You have night sweats.  You feel faint or pass out.   You have a fever or lasting symptoms for more than 2-3 days.  You have a fever and your symptoms suddenly get worse. MAKE SURE YOU:  Understand these instructions.  Will watch your condition.  Will get help right away if you are not doing well or get worse.   This information is not intended to replace advice given to you by your health care provider. Make sure you discuss any questions you have with your health care provider.   Document Released: 10/05/2012 Document Reviewed: 10/05/2012 Elsevier Interactive Patient Education [employer].

## 2015-12-21 NOTE — ED Notes (Signed)
Pt transported to xray 

## 2015-12-21 NOTE — ED Notes (Signed)
Coughing up blood yesterday. States had biopsy in throat approx 1 1/2 ago.

## 2015-12-23 ENCOUNTER — Inpatient Hospital Stay: Payer: Medicare Other | Attending: Oncology | Admitting: Oncology

## 2015-12-23 ENCOUNTER — Encounter: Payer: Self-pay | Admitting: Oncology

## 2015-12-23 VITALS — BP 106/72 | HR 101 | Resp 18 | Wt 113.4 lb

## 2015-12-23 DIAGNOSIS — K8020 Calculus of gallbladder without cholecystitis without obstruction: Secondary | ICD-10-CM | POA: Diagnosis not present

## 2015-12-23 DIAGNOSIS — Z923 Personal history of irradiation: Secondary | ICD-10-CM | POA: Insufficient documentation

## 2015-12-23 DIAGNOSIS — M129 Arthropathy, unspecified: Secondary | ICD-10-CM | POA: Diagnosis not present

## 2015-12-23 DIAGNOSIS — I2510 Atherosclerotic heart disease of native coronary artery without angina pectoris: Secondary | ICD-10-CM | POA: Diagnosis not present

## 2015-12-23 DIAGNOSIS — R918 Other nonspecific abnormal finding of lung field: Secondary | ICD-10-CM | POA: Insufficient documentation

## 2015-12-23 DIAGNOSIS — C139 Malignant neoplasm of hypopharynx, unspecified: Secondary | ICD-10-CM

## 2015-12-23 DIAGNOSIS — R634 Abnormal weight loss: Secondary | ICD-10-CM | POA: Insufficient documentation

## 2015-12-23 DIAGNOSIS — F17210 Nicotine dependence, cigarettes, uncomplicated: Secondary | ICD-10-CM | POA: Diagnosis not present

## 2015-12-23 DIAGNOSIS — H9201 Otalgia, right ear: Secondary | ICD-10-CM

## 2015-12-23 DIAGNOSIS — Z85048 Personal history of other malignant neoplasm of rectum, rectosigmoid junction, and anus: Secondary | ICD-10-CM | POA: Diagnosis not present

## 2015-12-23 DIAGNOSIS — Z9221 Personal history of antineoplastic chemotherapy: Secondary | ICD-10-CM | POA: Insufficient documentation

## 2015-12-23 DIAGNOSIS — Z803 Family history of malignant neoplasm of breast: Secondary | ICD-10-CM | POA: Diagnosis not present

## 2015-12-23 DIAGNOSIS — R1310 Dysphagia, unspecified: Secondary | ICD-10-CM | POA: Insufficient documentation

## 2015-12-23 DIAGNOSIS — Z79899 Other long term (current) drug therapy: Secondary | ICD-10-CM | POA: Diagnosis not present

## 2015-12-23 HISTORY — DX: Malignant neoplasm of hypopharynx, unspecified: C13.9

## 2015-12-23 NOTE — Progress Notes (Signed)
Patient here today for PET results. Patient seen in ED on Saturday for coughing up blood.  Patient states she has a swollen lymph node in right neck.

## 2015-12-23 NOTE — Progress Notes (Signed)
[place] @ [facility] [phone]) [phone]  Fax:(336) [place]: 1932/12/25  MR#: [id number]  LKG#:[id number]  Patient Care Team: [name], MD as PCP - General (Family Medicine) [name], MD (General Surgery) [name], MD (Unknown Physician Specialty) [name], MD (Family Medicine) [name], MD (General Surgery)  CHIEF COMPLAINT:  Chief Complaint  Patient presents with  . Rectal Cancer    Oncology History   rectal cancer T3, N1, M0 tumor on chemoradiation therapy Hospitalizations secondary to diarrhea(April, 2014) chemoradiation intrupted. C. difficile diarrhea. 2.Status post abdominal perineal resection pT3  pNO stage II  disease.  (As September, 2014) patient underwent surgery at [facility] 3.  Patient could not tolerate post operative chemotherapy FOLFOX     Rectal cancer ([place])   01/20/2013 Initial Diagnosis Rectal cancer   4.  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  He sent had been a chronic smoker and claims that has recently stopped smoking.  Since last November patient has noticed a mass in the right side of the neck.  Was treated with antibiotics without much improvement patient also has noticed hoarseness of voice.  His last month or so patient has progressive weight loss (approximately 12 pounds since last evaluation) Increasing pain in the right ear.  Pain for swallowing.  Patient was started on liquid hydrocodone and was evaluated by ENT surgeon.  On evaluation patient was found to have endophytic lesion of the right upper pyriform sinus involving posterior and lateral hypopharynx with extension to the medial hypopharynx and larynx  with a right vocal cord which appeared to be immobile Patient was referred to me at this point in time after PET scan for further evaluation and treatment consideration    REVIEW OF SYSTEMS:   Gen. status: Patient is gradually declining and losing weight as described about has lost 12 pounds of weight HEENT: As described about has hoarseness of voice.  Palpable lymph node on the right side of the neck.  Difficulty in swallowing.  An ENT evaluation is described about.  Patient recently had hemoptysis and was in the emergency room Lungs: No cough no shortness of breath patient has stopped smoking very recently Cardiac: No chest pain GI: Increasing difficulty in swallowing.  Patient had a recent colonoscopy done by Dr. Charlean Sanfilippo and no evidence of malignancy was found Skin: No rash Lower extremity: No swelling Neurological system: No headache no dizziness Family is reported progressive dementia All other systems have been reviewed patient has been accompanied with her son and daughter-in-law PAST MEDICAL HISTORY: Past Medical History  Diagnosis Date  . Status post chemotherapy     colon cancer  . Status post radiation therapy     colon cancer 2013  . Hypertension   . Diverticulosis   . Hemorrhoids   . Osteopenia   . History of chicken pox   . Cancer ([place])     rectal  . Cancer ([place])     colon cancer  . Arthritis     fingers  . 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 Appendectomy    .  Abdominal hysterectomy    . Tonsillectomy    . Eus N/A 01/20/2013    Procedure: LOWER ENDOSCOPIC ULTRASOUND (EUS);  Surgeon: Milus Banister, MD;  Location: [facility];  Service: Endoscopy;  Laterality: N/A;  . Colonoscopy    . Colon surgery  06/28/2013    resected tumor from colon; [facility]  . Appendectomy  [id number]    Dr. Pat Patrick  . Abdominal hysterectomy  1980    partial  .  Dilation and curettage of uterus  1957  . Colonoscopy N/A 10/01/2015    Procedure: COLONOSCOPY;  Surgeon: [name], MD;  Location: [facility] ENDOSCOPY;  Service: Endoscopy;  Laterality: N/A;  . Breast surgery      cyst removal  . Breast cyst aspiration Bilateral     negative  . Breast surgery  1960    Breast Biopsy  . Direct laryngoscopy N/A 12/11/2015    Procedure: DIRECT LARYNGOSCOPY;  Surgeon: Clyde Canterbury, MD;  Location: [facility] ORS;  Service: ENT;  Laterality: N/A;  . Esophagoscopy N/A 12/11/2015    Procedure: ESOPHAGOSCOPY;  Surgeon: Clyde Canterbury, MD;  Location: [facility] ORS;  Service: ENT;  Laterality: N/A;    FAMILY HISTORY Family History  Problem Relation Age of Onset  . Breast cancer Maternal Grandmother   . Kidney failure Father     ADVANCED DIRECTIVES:  Patient does have advance healthcare directive, Patient   does not desire to make any changes  HEALTH MAINTENANCE: Social History  Substance Use Topics  . Smoking status: Current Every Day Smoker -- 0.50 packs/day for 64 years    Types: Cigarettes  . Smokeless tobacco: Never Used  . Alcohol Use: 0.0 oz/week    0 Standard drinks or equivalent per week     Comment: rarely      Allergies  Allergen Reactions  . Amlodipine Besylate Other (See Comments)  . Oxycodone     confusion    Current Outpatient Prescriptions  Medication Sig Dispense Refill  . HYDROcodone-acetaminophen (HYCET) 7.5-325 mg/15 ml solution 10-15 cc every 4-6 hours as needed for pain 300 mL 0  . Melatonin 2.5 MG CAPS Take 1 capsule by mouth at bedtime as needed.     . metoprolol succinate (TOPROL-XL) 25 MG 24 hr tablet TAKE ONE-HALF TABLET BY MOUTH ONCE DAILY* NEEDS OFFICE VISIT* 90 tablet 3  . Multiple Vitamins-Minerals (MULTIVITAMIN ADULT PO) Take 1 tablet by mouth daily.    . Nutritional Supplements (ENSURE PLUS PO) Take by mouth.    . nystatin (MYCOSTATIN) 100000 UNIT/ML suspension SWISH AND SPIT 5 MILLILITERS (1 TEASPOONFUL) FOUR TIMES A DAY FOR  14 DAYS  0   No current facility-administered medications for this visit.    OBJECTIVE:  Filed Vitals:   12/23/15 1521  BP: 106/72  Pulse: 101  Resp: 18     Body mass index is 18.87 kg/(m^2).    ECOG FS:1 - Symptomatic but completely ambulatory  PHYSICAL EXAM: Gneral  status: Patient has declining performance status HEENT: No evidence of stomatitis. Sclera and conjunctivae :: No jaundice.   pale looking. Lungs: Air  entry equal on both sides.  No rhonchi.  No rales.  Cardiac: Heart sounds are normal.  No pericardial rub.  No murmur. Lymphatic system: Operable cervical lymph node approximately 2 cm.  No other lymphadenopathy GI: Abdomen is soft.  No ascites.  Liver spleen not palpable.  No tenderness.  Bowel sounds are within normal limit Lower extremity: No edema Neurological system: Higher functions, cranial nerves intact  no evidence of peripheral neuropathy. Skin: No rash.  No ecchymosis.Marland Kitchen   LAB RESULTS:  No visits with results within 2 Day(s) from this visit. Latest known visit with results is:  Admission on 12/21/2015, Discharged on 12/21/2015  Component Date Value Ref Range Status  . WBC 12/21/2015 8.4  3.6 - 11.0 K/uL Final  . RBC 12/21/2015 3.95  3.80 - 5.20 MIL/uL Final  . Hemoglobin 12/21/2015 12.0  12.0 - 16.0 g/dL Final  . HCT 12/21/2015 36.5  35.0 - 47.0 % Final  . MCV 12/21/2015 92.5  80.0 - 100.0 fL Final  . MCH 12/21/2015 30.5  26.0 - 34.0 pg Final  . MCHC 12/21/2015 33.0  32.0 - 36.0 g/dL Final  . RDW 12/21/2015 14.1  11.5 - 14.5 % Final  . Platelets 12/21/2015 230  150 - 440 K/uL Final  . Neutrophils Relative % 12/21/2015 83   Final  . Neutro Abs 12/21/2015 6.9* 1.4 - 6.5 K/uL Final  . Lymphocytes Relative 12/21/2015 10   Final  . Lymphs Abs 12/21/2015 0.9* 1.0 - 3.6 K/uL Final  . Monocytes Relative 12/21/2015 7   Final  . Monocytes Absolute 12/21/2015 0.6  0.2 - 0.9 K/uL Final  . Eosinophils Relative 12/21/2015 0   Final  . Eosinophils Absolute  12/21/2015 0.0  0 - 0.7 K/uL Final  . Basophils Relative 12/21/2015 0   Final  . Basophils Absolute 12/21/2015 0.0  0 - 0.1 K/uL Final  . Sodium 12/21/2015 141  135 - 145 mmol/L Final   LYTES REPEATED  . Potassium 12/21/2015 4.0  3.5 - 5.1 mmol/L Final  . Chloride 12/21/2015 107  101 - 111 mmol/L Final  . CO2 12/21/2015 31  22 - 32 mmol/L Final  . Glucose, Bld 12/21/2015 119* 65 - 99 mg/dL Final  . BUN 12/21/2015 24* 6 - 20 mg/dL Final  . Creatinine, Ser 12/21/2015 1.04* 0.44 - 1.00 mg/dL Final  . Calcium 12/21/2015 10.2  8.9 - 10.3 mg/dL Final  . Total Protein 12/21/2015 6.8  6.5 - 8.1 g/dL Final  . Albumin 12/21/2015 3.5  3.5 - 5.0 g/dL Final  . AST 12/21/2015 28  15 - 41 U/L Final  . ALT 12/21/2015 28  14 - 54 U/L Final  . Alkaline Phosphatase 12/21/2015 73  38 - 126 U/L Final  . Total Bilirubin 12/21/2015 0.5  0.3 - 1.2 mg/dL Final  . GFR calc non Af Amer 12/21/2015 49* >60 mL/min Final  . GFR calc Af Amer 12/21/2015 57* >60 mL/min Final   Comment: (NOTE) The eGFR has been calculated using the CKD EPI equation. This calculation has not been validated in all clinical situations. eGFR's persistently <60 mL/min signify possible Chronic Kidney Disease.   . Anion gap 12/21/2015 3* 5 - 15 Final      STUDIES: Dg Chest 2 View  12/21/2015  CLINICAL DATA:  Sore throat and hemoptysis. Recent laryngoscopy 12/11/2015 EXAM: CHEST  2 VIEW COMPARISON:  PET-CT 2 days prior 12/19/2015 FINDINGS: The cardiomediastinal contours are normal. Aortic atherosclerosis noted. Small nodular opacity in the right upper lung, not hypermetabolic on recent PET. Pulmonary vasculature is normal. No consolidation, pleural effusion, or pneumothorax. No acute osseous abnormalities are seen. Remote left rib fracture. IMPRESSION: No acute pulmonary process. Electronically Signed   By: Jeb Levering M.D.   On: 12/21/2015 16:21   Nm Pet Image Initial (pi) Skull Base To Thigh  12/19/2015  CLINICAL DATA:  Subsequent  treatment strategy for malignant neoplasm of the nasal  cavity. EXAM: NUCLEAR MEDICINE PET SKULL BASE TO THIGH TECHNIQUE: 12.47 mCi F-18 FDG was injected intravenously. Full-ring PET imaging was performed from the skull base to thigh after the radiotracer. CT data was obtained and used for attenuation correction and anatomic localization. FASTING BLOOD GLUCOSE:  Value: 105 mg/dl COMPARISON:  01/09/2013 FINDINGS: NECK Hypermetabolic lesion is identified at the in a hole a corresponding with the recently biopsied right lateral pharyngeal wall lesion extending into the piriform sinus. The SUV max associated with this lesion is equal to 16.7. Enlarged and hypermetabolic right level-II node measures 1.7 cm and has an SUV max equal to 11.57. Calcified atherosclerotic change involves the carotid arteries. CHEST No hypermetabolic mediastinal or hilar nodes. Aortic atherosclerosis noted. Calcification involving the RCA, LAD coronary arteries noted. No hypermetabolic mediastinal or hilar nodes identified. No suspicious pulmonary nodules on the CT scan. ABDOMEN/PELVIS No abnormal hypermetabolic activity within the liver, pancreas, adrenal glands, or spleen. 1 cm gallstone identified. No hypermetabolic lymph nodes in the abdomen or pelvis. SKELETON No focal hypermetabolic activity to suggest skeletal metastasis. IMPRESSION: 1. The right lateral pharyngeal wall lesion is intensely hypermetabolic compatible with primary head/neck carcinoma. 2. Hypermetabolic ipsilateral level 2 cervical lymph node. 3. Aortic atherosclerosis as well as coronary artery calcification 4. Gallstone. Electronically Signed   By: Kerby Moors M.D.   On: 12/19/2015 14:47    ASSESSMENT: 1.  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  PET scan shows extensive tumor invading lateral  pharyngeal wall and larynx Positive level II cervical lymph node just clinically palpable and less than 3 cm 2.  Significant weight loss 3.  History of chronic smoking which patient has just stopped smoking recently 4.  Rectal cancer with negative colonoscopy in November and stable CEA  I had prolonged discussion with patient and family.  Case was also discussed with the ENT surgeon and will be discussed with radiation oncology sending tumor conference  Following options were discussed 1.  I am very surgery option patient probably has locally advanced disease and considering patient's old age and multiple comorbid conditions and patient may not able to tolerate surgical treatment but that has been discussed and no period has been offered and patient and family did not want to consider surgery at present time  2.  Possibility of combination of radiation and chemotherapy with cisplatin numbers consider patient is not a candidate for aggressive neoadjuvant or induction chemotherapy at present time Possibility  of weekly cis-platinum and radiation can be considered. 3.  Patient has poor nutritional status which needs to be addressed by putting a PEG tube and get dietitian involved  4.  Carcinoma of rectum no evidence of recurrent or progressive disease at present time PET scan has been reviewed independently reviewed with the patient and family.   Patient expressed understanding and was in agreement with this plan. She also understands that She can call clinic at any time with any questions, concerns, or complaints.    No matching staging information was found for the patient.  [name], MD   12/23/2015 4:05 PM

## 2015-12-26 ENCOUNTER — Telehealth: Payer: Self-pay | Admitting: *Deleted

## 2015-12-26 ENCOUNTER — Other Ambulatory Visit: Payer: Self-pay | Admitting: *Deleted

## 2015-12-26 DIAGNOSIS — C139 Malignant neoplasm of hypopharynx, unspecified: Secondary | ICD-10-CM

## 2015-12-26 NOTE — Telephone Encounter (Signed)
-----   Message from [name], MD sent at 12/26/2015  4:13 PM EST ----- See if they can get the barium swallow on Monday, OV on Tuesday> Thanks.  ----- Message -----    From: Dominga Ferry, CMA    Sent: 12/26/2015   3:01 PM      To: [name], MD  Per patient's son, they cannot come in tomorrow. They could come in on Monday, 12-30-15  if you wanted to work around your O.R. Times and stereo.  Patient is seeing Dr. Oliva Bustard on Monday at 10 am and Dr. Baruch Gouty at 11 am. Otherwise, they will need to keep appointment which is scheduled for Tuesday, 2-28. I have already entered order for barium swallow but this has not been arranged yet. You will just need to sign order and I/Maureen can schedule when patient comes in for her appointment. Looks like the soonest we can get her in for surgery would be 01-09-16. Thanks.  ----- Message -----    From: [name], MD    Sent: 12/26/2015   2:14 PM      To: Dominga Ferry, CMA  Dr. Oliva Bustard asked that we see the patient re: head and neck cancer with need for PEG and likely port. See if she can come in first thing tomorrow morning to discuss.  She will need a barium swallow preop.

## 2015-12-26 NOTE — Telephone Encounter (Signed)
Spoke with the patient's son, [name], today. We have patient scheduled for a barium swallow at [facility] for 12-30-15 at 1:30 pm (arrive 1:15 pm, check in at the registration desk [place]). Prep: NPO 3 hours prior.   [name] verbalizes understanding.   This patient will follow up in the office as scheduled with Dr. Bary Castilla on Tuesday, 12-31-15.

## 2015-12-30 ENCOUNTER — Encounter: Payer: Self-pay | Admitting: Radiation Oncology

## 2015-12-30 ENCOUNTER — Inpatient Hospital Stay: Payer: Medicare Other | Admitting: Oncology

## 2015-12-30 ENCOUNTER — Ambulatory Visit
Admission: RE | Admit: 2015-12-30 | Discharge: 2015-12-30 | Disposition: A | Discharge status: Home / Self Care | Payer: Medicare Other | Source: Ambulatory Visit | Attending: General Surgery | Admitting: General Surgery

## 2015-12-30 ENCOUNTER — Ambulatory Visit
Admission: RE | Admit: 2015-12-30 | Discharge: 2015-12-30 | Disposition: A | Discharge status: Home / Self Care | Payer: Medicare Other | Source: Ambulatory Visit | Attending: Radiation Oncology | Admitting: Radiation Oncology

## 2015-12-30 ENCOUNTER — Other Ambulatory Visit: Payer: Self-pay | Admitting: *Deleted

## 2015-12-30 VITALS — BP 145/70 | HR 95 | Temp 95.2°F | Resp 18 | Wt 115.3 lb

## 2015-12-30 DIAGNOSIS — C139 Malignant neoplasm of hypopharynx, unspecified: Secondary | ICD-10-CM | POA: Insufficient documentation

## 2015-12-30 DIAGNOSIS — R634 Abnormal weight loss: Secondary | ICD-10-CM | POA: Diagnosis not present

## 2015-12-30 DIAGNOSIS — Z510 Encounter for antineoplastic radiation therapy: Secondary | ICD-10-CM | POA: Insufficient documentation

## 2015-12-30 DIAGNOSIS — R1310 Dysphagia, unspecified: Secondary | ICD-10-CM | POA: Diagnosis not present

## 2015-12-30 DIAGNOSIS — C12 Malignant neoplasm of pyriform sinus: Secondary | ICD-10-CM | POA: Diagnosis not present

## 2015-12-30 NOTE — Consult Note (Signed)
Except an outstanding is perfect of Radiation Oncology NEW PATIENT EVALUATION  Name: Emily [name]  MRN: [id number]  Date:   12/30/2015     DOB: [birth year]   This 82 y.o. female patient presents to the clinic for initial evaluation of head and neck cancer stage for a carcinoma hypopharynx inpatient previous he treated in 2014 for locally advanced rectal cancer.  REFERRING PHYSICIAN: [name], MD  CHIEF COMPLAINT:  Chief Complaint  Patient presents with  . Cancer    Pt is here for initial consultation of head and neck cancer.      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   PREVIOUS INVESTIGATIONS:  PET CT scan reviewed Clinical notes reviewed Surgical pathology report reviewed  HPI: Patient is a 82 year old female well known to our department having been treated back in 2014 with neoadjuvant chemoradiation prior to AP resection for locally advanced rectal cancer. She has a prolonged smoking history recently quit smoking. Back in November 2016 she noticed a mass in the right cervical chain initially treated empirically with antibiotic therapy without significant decrease in size. She's also had approximately 12 pound weight loss and states her swallowing is difficult. She also has bilateral ear pain. She was seen by ENT found to have an exophytic lesion of the right upper piriform sinus involving the posterior and lateral hypopharynx with extension to the medial hypopharynx and larynx with right vocal cord immobility. Biopsy was performed to the piriform sinus and positive for squamous cell carcinoma. She has been seen by medical oncology. PET CT scan was performed showing right lateral pharyngeal wall lesion hypermetabolic compatible with primary tumor as well as to hyper minim [place] ipsilateral right cervical lymph nodes. She is scheduled to see surgeon tomorrow for possible port and feeding tube placement and is now  referred to radiation oncology for opinion.  PLANNED TREATMENT REGIMEN: Concurrent chemoradiation with curative intent  PAST MEDICAL HISTORY:  has a past medical history of Status post chemotherapy; Status post radiation therapy; Hypertension; Diverticulosis; Hemorrhoids; Osteopenia; History of chicken pox; Cancer ([place]); Cancer ([place]); Arthritis; and Cancer of contiguous sites of hypopharynx ([place]) (12/23/2015).    PAST SURGICAL HISTORY:  Past Surgical History  Procedure Laterality Date  . Appendectomy    . Abdominal hysterectomy    . Tonsillectomy    . Eus N/A 01/20/2013    Procedure: LOWER ENDOSCOPIC ULTRASOUND (EUS);  Surgeon: Milus Banister, MD;  Location: [facility];  Service: Endoscopy;  Laterality: N/A;  . Colonoscopy    . Colon surgery  06/28/2013    rese[facility] tumor from colon; [facility]  . Appendectomy  [id number]    Dr. Pat Patrick  . Abdominal hysterectomy  1980    partial  . Dilation and curettage of uterus  1957  . Colonoscopy N/A 10/01/2015    Procedure: COLONOSCOPY;  Surgeon: [name], MD;  Location: [facility] ENDOSCOPY;  Service: Endoscopy;  Laterality: N/A;  . Breast surgery      cyst removal  . Breast cyst aspiration Bilateral     negative  . Breast surgery  1960    Breast Biopsy  . Direct laryngoscopy N/A 12/11/2015    Procedure: DIRECT LARYNGOSCOPY;  Surgeon: Clyde Canterbury, MD;  Location: [facility] ORS;  Service: ENT;  Laterality: N/A;  . Esophagoscopy N/A 12/11/2015    Procedure: ESOPHAGOSCOPY;  Surgeon: Clyde Canterbury, MD;  Location: [facility] ORS;  Service: ENT;  Laterality: N/A;    FAMILY HISTORY: family  history includes Breast cancer in her maternal grandmother; Kidney failure in her father.  SOCIAL HISTORY:  reports that she has been smoking Cigarettes.  She has a 32 pack-year smoking history. She has never used smokeless tobacco. She reports that she drinks alcohol. She reports that she does not use illicit drugs.  ALLERGIES: Amlodipine besylate and  Oxycodone  MEDICATIONS:  Current Outpatient Prescriptions  Medication Sig Dispense Refill  . HYDROcodone-acetaminophen (HYCET) 7.5-325 mg/15 ml solution 10-15 cc every 4-6 hours as needed for pain 300 mL 0  . Melatonin 2.5 MG CAPS Take 1 capsule by mouth at bedtime as needed.     . metoprolol succinate (TOPROL-XL) 25 MG 24 hr tablet TAKE ONE-HALF TABLET BY MOUTH ONCE DAILY* NEEDS OFFICE VISIT* 90 tablet 3  . Nutritional Supplements (ENSURE PLUS PO) Take by mouth.    . Multiple Vitamins-Minerals (MULTIVITAMIN ADULT PO) Take 1 tablet by mouth daily. Reported on 12/30/2015    . nystatin (MYCOSTATIN) 100000 UNIT/ML suspension Reported on 12/30/2015  0   No current facility-administered medications for this encounter.    ECOG PERFORMANCE STATUS:  1 - Symptomatic but completely ambulatory  REVIEW OF SYSTEMS: Except for the head and neck pain slight dysphasia headache today and occasional bleeding in her throat Patient denies any weight loss, fatigue, weakness, fever, chills or night sweats. Patient denies any loss of vision, blurred vision. Patient denies any ringing  of the ears or hearing loss. No irregular heartbeat. Patient denies heart murmur or history of fainting. Patient denies any chest pain or pain radiating to her upper extremities. Patient denies any shortness of breath, difficulty breathing at night, cough or hemoptysis. Patient denies any swelling in the lower legs. Patient denies any nausea vomiting, vomiting of blood, or coffee ground material in the vomitus. Patient denies any stomach pain. Patient states has had normal bowel movements no significant constipation or diarrhea. Patient denies any dysuria, hematuria or significant nocturia. Patient denies any problems walking, swelling in the joints or loss of balance. Patient denies any skin changes, loss of hair or loss of weight. Patient denies any excessive worrying or anxiety or significant depression. Patient denies any problems with  insomnia. Patient denies excessive thirst, polyuria, polydipsia. Patient denies any swollen glands, patient denies easy bruising or easy bleeding. Patient denies any recent infections, allergies or URI. Patient "s visual fields have not changed significantly in recent time.    PHYSICAL EXAM: BP 145/70 mmHg  Pulse 95  Temp(Src) 95.2 F (35.1 C)  Resp 18  Wt 115 lb 4.8 oz (52.3 kg) Oral cavity is clear patient is upper dentures. No oral mucosal lesions are identified. Indirect mirror examination shows decreased mobility of the right cord. There appears to be a mass in the region of the right piriform sinus compatible with known diagnosis. She does have prominent high level right cervical lymph nodes. No other left cervical or supraclavicular adenopathy is appreciated. Well-developed well-nourished patient in NAD. HEENT reveals PERLA, EOMI, discs not visualized.  Oral cavity is clear. No oral mucosal lesions are identified. Neck is clear without evidence of cervical or supraclavicular adenopathy. Lungs are clear to A&P. Cardiac examination is essentially unremarkable with regular rate and rhythm without murmur rub or thrill. Abdomen is benign with no organomegaly or masses noted. Motor sensory and DTR levels are equal and symmetric in the upper and lower extremities. Cranial nerves II through XII are grossly intact. Proprioception is intact. No peripheral adenopathy or edema is identified. No motor or sensory levels  are noted. 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 I would plan on  delivering 7000 cGy to her primary tumor and involved nodes by PET CT criteria using I M RT treatment planning and delivery. Patient is scheduled for possible port placement and feeding tube placed by surgery and has a meeting with surgeon tomorrow. Risks and benefits of radiation including increased dysphasia, fatigue skin reaction dry mouth, alteration of blood counts skin reaction all were discussed in detail with the patient. I may contemplate starting her on some steroids to help increase her appetite as well as problems with overall head and neck pain at this time. She has been taking some liquid narcotic analgesics and will see surgeon tomorrow for possible renewal that prescription. I have set up and ordered CT simulation tomorrow.  I would like to take this opportunity for allowing me to participate in the care of your patient.Armstead Peaks., MD

## 2015-12-31 ENCOUNTER — Inpatient Hospital Stay (HOSPITAL_BASED_OUTPATIENT_CLINIC_OR_DEPARTMENT_OTHER): Payer: Medicare Other | Admitting: Oncology

## 2015-12-31 ENCOUNTER — Encounter: Payer: Self-pay | Admitting: Oncology

## 2015-12-31 ENCOUNTER — Encounter: Payer: Self-pay | Admitting: General Surgery

## 2015-12-31 ENCOUNTER — Ambulatory Visit (INDEPENDENT_AMBULATORY_CARE_PROVIDER_SITE_OTHER): Payer: Medicare Other | Admitting: General Surgery

## 2015-12-31 ENCOUNTER — Ambulatory Visit
Admission: RE | Admit: 2015-12-31 | Discharge: 2015-12-31 | Disposition: A | Discharge status: Home / Self Care | Payer: Medicare Other | Source: Ambulatory Visit | Attending: Radiation Oncology | Admitting: Radiation Oncology

## 2015-12-31 VITALS — BP 144/75 | HR 73 | Temp 95.2°F | Wt 115.3 lb

## 2015-12-31 VITALS — BP 104/64 | HR 68 | Resp 14 | Ht 65.0 in | Wt 115.0 lb

## 2015-12-31 DIAGNOSIS — Z923 Personal history of irradiation: Secondary | ICD-10-CM

## 2015-12-31 DIAGNOSIS — C20 Malignant neoplasm of rectum: Secondary | ICD-10-CM

## 2015-12-31 DIAGNOSIS — R918 Other nonspecific abnormal finding of lung field: Secondary | ICD-10-CM

## 2015-12-31 DIAGNOSIS — C12 Malignant neoplasm of pyriform sinus: Secondary | ICD-10-CM | POA: Diagnosis not present

## 2015-12-31 DIAGNOSIS — C4442 Squamous cell carcinoma of skin of scalp and neck: Secondary | ICD-10-CM | POA: Insufficient documentation

## 2015-12-31 DIAGNOSIS — H9201 Otalgia, right ear: Secondary | ICD-10-CM

## 2015-12-31 DIAGNOSIS — R1310 Dysphagia, unspecified: Secondary | ICD-10-CM

## 2015-12-31 DIAGNOSIS — C139 Malignant neoplasm of hypopharynx, unspecified: Secondary | ICD-10-CM

## 2015-12-31 DIAGNOSIS — I2510 Atherosclerotic heart disease of native coronary artery without angina pectoris: Secondary | ICD-10-CM

## 2015-12-31 DIAGNOSIS — Z85048 Personal history of other malignant neoplasm of rectum, rectosigmoid junction, and anus: Secondary | ICD-10-CM

## 2015-12-31 DIAGNOSIS — Z9221 Personal history of antineoplastic chemotherapy: Secondary | ICD-10-CM

## 2015-12-31 DIAGNOSIS — Z510 Encounter for antineoplastic radiation therapy: Secondary | ICD-10-CM | POA: Diagnosis not present

## 2015-12-31 DIAGNOSIS — M129 Arthropathy, unspecified: Secondary | ICD-10-CM

## 2015-12-31 DIAGNOSIS — F17210 Nicotine dependence, cigarettes, uncomplicated: Secondary | ICD-10-CM

## 2015-12-31 DIAGNOSIS — K8020 Calculus of gallbladder without cholecystitis without obstruction: Secondary | ICD-10-CM

## 2015-12-31 DIAGNOSIS — R634 Abnormal weight loss: Secondary | ICD-10-CM | POA: Diagnosis not present

## 2015-12-31 DIAGNOSIS — Z79899 Other long term (current) drug therapy: Secondary | ICD-10-CM

## 2015-12-31 DIAGNOSIS — Z803 Family history of malignant neoplasm of breast: Secondary | ICD-10-CM

## 2015-12-31 MED ORDER — DEXTROSE 5 % IV SOLN: 1.0000 g | Freq: Three times a day (TID) | INTRAVENOUS | Status: DC

## 2015-12-31 NOTE — Progress Notes (Signed)
Patient states her throat is sore.  She will be getting feeding tube placed before starting chemo.  Saw Dr. Bary Castilla today.

## 2015-12-31 NOTE — Patient Instructions (Addendum)
The patient is aware to call back for any questions or concerns.  Patient to be scheduled for port placement.   This patient's surgery has been scheduled for 01-01-16 at [facility].

## 2015-12-31 NOTE — Patient Instructions (Signed)
Cisplatin injection What is this medicine? CISPLATIN (SIS pla tin) is a chemotherapy drug. It targets fast dividing cells, like cancer cells, and causes these cells to die. This medicine is used to treat many types of cancer like bladder, ovarian, and testicular cancers. This medicine may be used for other purposes; ask your health care provider or pharmacist if you have questions. What should I tell my health care provider before I take this medicine? They need to know if you have any of these conditions: -blood disorders -hearing problems -kidney disease -recent or ongoing radiation therapy -an unusual or allergic reaction to cisplatin, carboplatin, other chemotherapy, other medicines, foods, dyes, or preservatives -pregnant or trying to get pregnant -breast-feeding How should I use this medicine? This drug is given as an infusion into a vein. It is administered in a hospital or clinic by a specially trained health care professional. Talk to your pediatrician regarding the use of this medicine in children. Special care may be needed. Overdosage: If you think you have taken too much of this medicine contact a poison control center or emergency room at once. NOTE: This medicine is only for you. Do not share this medicine with others. What if I miss a dose? It is important not to miss a dose. Call your doctor or health care professional if you are unable to keep an appointment. What may interact with this medicine? -dofetilide -foscarnet -medicines for seizures -medicines to increase blood counts like filgrastim, pegfilgrastim, sargramostim -probenecid -pyridoxine used with altretamine -rituximab -some antibiotics like amikacin, gentamicin, neomycin, polymyxin B, streptomycin, tobramycin -sulfinpyrazone -vaccines -zalcitabine Talk to your doctor or health care professional before taking any of these medicines: -acetaminophen -aspirin -ibuprofen -ketoprofen -naproxen This list may  not describe all possible interactions. Give your health care provider a list of all the medicines, herbs, non-prescription drugs, or dietary supplements you use. Also tell them if you smoke, drink alcohol, or use illegal drugs. Some items may interact with your medicine. What should I watch for while using this medicine? Your condition will be monitored carefully while you are receiving this medicine. You will need important blood work done while you are taking this medicine. This drug may make you feel generally unwell. This is not uncommon, as chemotherapy can affect healthy cells as well as cancer cells. Report any side effects. Continue your course of treatment even though you feel ill unless your doctor tells you to stop. In some cases, you may be given additional medicines to help with side effects. Follow all directions for their use. Call your doctor or health care professional for advice if you get a fever, chills or sore throat, or other symptoms of a cold or flu. Do not treat yourself. This drug decreases your body's ability to fight infections. Try to avoid being around people who are sick. This medicine may increase your risk to bruise or bleed. Call your doctor or health care professional if you notice any unusual bleeding. Be careful brushing and flossing your teeth or using a toothpick because you may get an infection or bleed more easily. If you have any dental work done, tell your dentist you are receiving this medicine. Avoid taking products that contain aspirin, acetaminophen, ibuprofen, naproxen, or ketoprofen unless instructed by your doctor. These medicines may hide a fever. Do not become pregnant while taking this medicine. Women should inform their doctor if they wish to become pregnant or think they might be pregnant. There is a potential for serious side effects to   an unborn child. Talk to your health care professional or pharmacist for more information. Do not breast-feed an  infant while taking this medicine. Drink fluids as directed while you are taking this medicine. This will help protect your kidneys. Call your doctor or health care professional if you get diarrhea. Do not treat yourself. What side effects may I notice from receiving this medicine? Side effects that you should report to your doctor or health care professional as soon as possible: -allergic reactions like skin rash, itching or hives, swelling of the face, lips, or tongue -signs of infection - fever or chills, cough, sore throat, pain or difficulty passing urine -signs of decreased platelets or bleeding - bruising, pinpoint red spots on the skin, black, tarry stools, nosebleeds -signs of decreased red blood cells - unusually weak or tired, fainting spells, lightheadedness -breathing problems -changes in hearing -gout pain -low blood counts - This drug may decrease the number of white blood cells, red blood cells and platelets. You may be at increased risk for infections and bleeding. -nausea and vomiting -pain, swelling, redness or irritation at the injection site -pain, tingling, numbness in the hands or feet -problems with balance, movement -trouble passing urine or change in the amount of urine Side effects that usually do not require medical attention (report to your doctor or health care professional if they continue or are bothersome): -changes in vision -loss of appetite -metallic taste in the mouth or changes in taste This list may not describe all possible side effects. Call your doctor for medical advice about side effects. You may report side effects to FDA at 1-800-FDA-1088. Where should I keep my medicine? This drug is given in a hospital or clinic and will not be stored at home. NOTE: This sheet is a summary. It may not cover all possible information. If you have questions about this medicine, talk to your doctor, pharmacist, or health care provider.    2016, Elsevier/Gold  Standard. (2008-01-24 14:40:54)  

## 2015-12-31 NOTE — Progress Notes (Signed)
Patient ID: Emily [name], female   DOB: [birth year], 82 y.o.   MRN: [id number]  Chief Complaint  Patient presents with  . Other    feeding tube placement    HPI Emily [name] is a 82 y.o. female.  Here today to discuss having a feeding tube placed. She is currently eating apple sause and pudding for food intake.  Plans at present are for chemotherapy and radiation. Central venous access and a feeding tube has been requested by her treating oncologist.  The patient is accompanied today by her son, Emily [name], who was present for the interview and exam.  I personally reviewed the patient's history.  Marland Kitchen   HPI  Past Medical History  Diagnosis Date  . Status post chemotherapy     colon cancer  . Status post radiation therapy     colon cancer 2013  . Hypertension   . Diverticulosis   . Hemorrhoids   . Osteopenia   . History of chicken pox   . Cancer ([place])     rectal  . Cancer ([place])     colon cancer  . Arthritis     fingers  . Cancer of contiguous sites of hypopharynx [facility]) 12/23/2015    Past Surgical History  Procedure Laterality Date  . Appendectomy    . Abdominal hysterectomy    . Tonsillectomy    . Eus N/A 01/20/2013    Procedure: LOWER ENDOSCOPIC ULTRASOUND (EUS);  Surgeon: Milus Banister, MD;  Location: [facility];  Service: Endoscopy;  Laterality: N/A;  . Colonoscopy    . Colon surgery  06/28/2013    resected tumor from colon; [facility]  . Appendectomy  [id number]    Dr. Pat Patrick  . Abdominal hysterectomy  1980    partial  . Dilation and curettage of uterus  1957  . Colonoscopy N/A 10/01/2015    Procedure: COLONOSCOPY;  Surgeon: [name], MD;  Location: [facility] ENDOSCOPY;  Service: Endoscopy;  Laterality: N/A;  . Breast surgery      cyst removal  . Breast cyst aspiration Bilateral     negative  . Breast surgery  1960    Breast Biopsy  . Direct laryngoscopy N/A 12/11/2015    Procedure: DIRECT LARYNGOSCOPY;  Surgeon: Clyde Canterbury, MD;  Location: [facility] ORS;  Service: ENT;  Laterality: N/A;  . Esophagoscopy N/A 12/11/2015    Procedure: ESOPHAGOSCOPY;  Surgeon: Clyde Canterbury, MD;  Location: [facility] ORS;  Service: ENT;  Laterality: N/A;    Family History  Problem Relation Age of Onset  . Breast cancer Maternal Grandmother   . Kidney failure Father     Social History Social History  Substance Use Topics  . Smoking status: Current Every Day Smoker -- 0.50 packs/day for 64 years    Types: Cigarettes  . Smokeless tobacco: Never Used  . Alcohol Use: 0.0 oz/week    0 Standard drinks or equivalent per week     Comment: rarely    Allergies  Allergen Reactions  . Amlodipine Besylate Other (See Comments)  . Oxycodone     confusion    Current Outpatient Prescriptions  Medication Sig Dispense Refill  . HYDROcodone-acetaminophen (HYCET) 7.5-325 mg/15 ml solution 10-15 cc every 4-6 hours as needed for pain 300 mL 0  . Melatonin 2.5 MG CAPS Take 1 capsule by mouth at bedtime as needed.     . metoprolol succinate (TOPROL-XL) 25 MG 24 hr tablet TAKE ONE-HALF TABLET BY MOUTH ONCE DAILY* NEEDS OFFICE  VISIT* 90 tablet 3  . Multiple Vitamins-Minerals (MULTIVITAMIN ADULT PO) Take 1 tablet by mouth daily. Reported on 12/30/2015    . Nutritional Supplements (ENSURE PLUS PO) Take by mouth.    . nystatin (MYCOSTATIN) 100000 UNIT/ML suspension Reported on 12/30/2015  0   No current facility-administered medications for this visit.    Review of Systems Review of Systems  Blood pressure 104/64, pulse 68, resp. rate 14, height 5\' 5"  (1.651 m), weight 115 lb (52.164 kg).  The patient has lost 9 pounds from her November 2016 exam when she was seen prior to a colonoscopy.  Physical Exam Physical Exam  Constitutional: She is oriented to person, place, and time. She appears well-developed and well-nourished.  Eyes: Conjunctivae are normal. No scleral icterus.  Neck: Neck supple.    Decreased vocal volume with general  hoarseness.  Cardiovascular: Normal rate, regular rhythm and normal heart sounds.   Pulmonary/Chest: Effort normal and breath sounds normal.  Abdominal: Soft. Bowel sounds are normal. There is no tenderness.    Lymphadenopathy:    She has no cervical adenopathy.  Neurological: She is alert and oriented to person, place, and time.  Skin: Skin is warm and dry.    Data Reviewed The 12/11/2015 triple endoscopy report completed by Malon Kindle, M.D. was reviewed. Biopsy showed invasive squamous cell carcinoma.  Name swallow completed yesterday to assess patency of the esophagus was reviewed. There is narrowing especially behind the aortic arch to 6 mm. (Thoracic esophagus reported as normal by the radiologist). Proximal esophagus free of disease on triple endoscopy report. Evidence of barium penetration without cough.  Assessment    T3 N1 carcinoma of the had neck.    Plan    The patient has had dysphagia and weight loss, and this will likely only worsen with the initiation of radiation therapy. While its possible her swallowing may improve after chemotherapy and radiation, she has little caloric reserve based on her previous weight loss and likelihood of progressive weight loss if increased edema and narrowing of the esophagus progresses.  Central venous access has been requested by her treating oncologist. Burnis Medin look to place a percutaneous gastrostomy at the same time. If it is not safe to transverse the esophagus the possibility of an open gastrostomy tube has been discussed with the patient and her son.  Her previous port was on the left, and we'll plan to make use of the right subclavian if ultrasound shows this to be the better of 2 vessels. Her graft the risks associated with central venous catheterization again been reviewed including pneumothorax and vascular injury.   Patient to be scheduled for port placement.  This patient's surgery has been scheduled for 01-01-16 at [facility].    The patient's son frequently answered questions for the patient, especially regarding her willingness to proceed with the above described procedures. I emphasized to him that it was important that his mother give her consent before starting this new adventure for an advanced head and neck cancer. The patient did give consent and we will landed proceed. To minimize delay, the procedure will be scheduled for late tomorrow as opposed to earlier in the day next week..   Greater than 50% of the visit was spent reviewing prior records and reviewing options for management.  PCP:  [name] This information has been scribed by Gaspar Cola CMA.   [name] 12/31/2015, 9:30 PM

## 2015-12-31 NOTE — H&P (Signed)
HPI  Emily [name] is a 82 y.o. female. Here today to discuss having a feeding tube placed. She is currently eating apple sause and pudding for food intake.  Plans at present are for chemotherapy and radiation. Central venous access and a feeding tube has been requested by her treating oncologist.  The patient is accompanied today by her son, Niang Domm, who was present for the interview and exam.  I personally reviewed the patient's history.  Marland Kitchen  HPI  Past Medical History   Diagnosis  Date   .  Status post chemotherapy      colon cancer   .  Status post radiation therapy      colon cancer 2013   .  Hypertension    .  Diverticulosis    .  Hemorrhoids    .  Osteopenia    .  History of chicken pox    .  Cancer ([place])      rectal   .  Cancer ([place])      colon cancer   .  Arthritis      fingers   .  Cancer of contiguous sites of hypopharynx [facility])  12/23/2015    Past Surgical History   Procedure  Laterality  Date   .  Appendectomy     .  Abdominal hysterectomy     .  Tonsillectomy     .  Eus  N/A  01/20/2013     Procedure: LOWER ENDOSCOPIC ULTRASOUND (EUS); Surgeon: Milus Banister, MD; Location: [facility]; Service: Endoscopy; Laterality: N/A;   .  Colonoscopy     .  Colon surgery   06/28/2013     resected tumor from colon; [facility]   .  Appendectomy   [id number]     Dr. Pat Patrick   .  Abdominal hysterectomy   1980     partial   .  Dilation and curettage of uterus   1957   .  Colonoscopy  N/A  10/01/2015     Procedure: COLONOSCOPY; Surgeon: [name], MD; Location: [facility] ENDOSCOPY; Service: Endoscopy; Laterality: N/A;   .  Breast surgery       cyst removal   .  Breast cyst aspiration  Bilateral      negative   .  Breast surgery   1960     Breast Biopsy   .  Direct laryngoscopy  N/A  12/11/2015     Procedure: DIRECT LARYNGOSCOPY; Surgeon: Clyde Canterbury, MD; Location: [facility] ORS; Service: ENT; Laterality: N/A;   .  Esophagoscopy  N/A  12/11/2015    Procedure: ESOPHAGOSCOPY; Surgeon: Clyde Canterbury, MD; Location: [facility] ORS; Service: ENT; Laterality: N/A;    Family History   Problem  Relation  Age of Onset   .  Breast cancer  Maternal Grandmother    .  Kidney failure  Father     Social History  Social History   Substance Use Topics   .  Smoking status:  Current Every Day Smoker -- 0.50 packs/day for 64 years     Types:  Cigarettes   .  Smokeless tobacco:  Never Used   .  Alcohol Use:  0.0 oz/week     0 Standard drinks or equivalent per week      Comment: rarely    Allergies   Allergen  Reactions   .  Amlodipine Besylate  Other (See Comments)   .  Oxycodone      confusion  Current Outpatient Prescriptions   Medication  Sig  Dispense  Refill   .  HYDROcodone-acetaminophen (HYCET) 7.5-325 mg/15 ml solution  10-15 cc every 4-6 hours as needed for pain  300 mL  0   .  Melatonin 2.5 MG CAPS  Take 1 capsule by mouth at bedtime as needed.     .  metoprolol succinate (TOPROL-XL) 25 MG 24 hr tablet  TAKE ONE-HALF TABLET BY MOUTH ONCE DAILY* NEEDS OFFICE VISIT*  90 tablet  3   .  Multiple Vitamins-Minerals (MULTIVITAMIN ADULT PO)  Take 1 tablet by mouth daily. Reported on 12/30/2015     .  Nutritional Supplements (ENSURE PLUS PO)  Take by mouth.     .  nystatin (MYCOSTATIN) 100000 UNIT/ML suspension  Reported on 12/30/2015   0    No current facility-administered medications for this visit.    Review of Systems  Review of Systems  Blood pressure 104/64, pulse 68, resp. rate 14, height 5\' 5"  (1.651 m), weight 115 lb (52.164 kg).  The patient has lost 9 pounds from her November 2016 exam when she was seen prior to a colonoscopy.  Physical Exam  Physical Exam  Constitutional: She is oriented to person, place, and time. She appears well-developed and well-nourished.  Eyes: Conjunctivae are normal. No scleral icterus.  Neck: Neck supple.    Decreased vocal volume with general hoarseness.  Cardiovascular: Normal rate, regular rhythm and  normal heart sounds.  Pulmonary/Chest: Effort normal and breath sounds normal.  Abdominal: Soft. Bowel sounds are normal. There is no tenderness.    Lymphadenopathy:  She has no cervical adenopathy.  Neurological: She is alert and oriented to person, place, and time.  Skin: Skin is warm and dry.   Data Reviewed  The 12/11/2015 triple endoscopy report completed by Malon Kindle, M.D. was reviewed. Biopsy showed invasive squamous cell carcinoma.  Name swallow completed yesterday to assess patency of the esophagus was reviewed. There is narrowing especially behind the aortic arch to 6 mm. (Thoracic esophagus reported as normal by the radiologist). Proximal esophagus free of disease on triple endoscopy report. Evidence of barium penetration without cough.  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   Plan   The patient has had dysphagia and weight loss, and this will likely only worsen with the initiation of radiation therapy. While its possible her swallowing may improve after chemotherapy and radiation, she has little caloric reserve based on her previous weight loss and likelihood of progressive weight loss if increased edema and narrowing of the esophagus progresses.  Central venous access has been requested by her treating oncologist. Burnis Medin look to place a percutaneous gastrostomy at the same time. If it is not safe to transverse the esophagus the possibility of an open gastrostomy tube has been discussed with the patient and her son.  Her previous port was on the left, and we'll plan to make use of the right subclavian if ultrasound shows this to be the better of 2 vessels. Her graft the risks associated with central venous catheterization again been reviewed including pneumothorax and vascular injury.  Patient to be scheduled for port placement.  This patient's surgery has been scheduled for 01-01-16 at [facility]  The patient's son frequently answered questions for the patient, especially  regarding her willingness to proceed with the above described procedures. I emphasized to him that it was important that his mother give her consent before starting this new adventure for an advanced head and neck cancer.  The patient did give consent and we will landed proceed. To minimize delay, the procedure will be scheduled for late tomorrow as opposed to earlier in the day next week..  Greater than 50% of the visit was spent reviewing prior records and reviewing options for management.  PCP: [name]  This information has been scribed by Gaspar Cola CMA.  [name]  12/31/2015, 9:30 PM

## 2016-01-01 ENCOUNTER — Encounter
Admission: RE | Disposition: A | Discharge status: Home / Self Care | Payer: Self-pay | Source: Ambulatory Visit | Attending: General Surgery

## 2016-01-01 ENCOUNTER — Ambulatory Visit: Payer: Medicare Other | Admitting: Anesthesiology

## 2016-01-01 ENCOUNTER — Ambulatory Visit: Payer: Medicare Other

## 2016-01-01 ENCOUNTER — Ambulatory Visit
Admission: RE | Admit: 2016-01-01 | Discharge: 2016-01-01 | Disposition: A | Discharge status: Home / Self Care | Payer: Medicare Other | Source: Ambulatory Visit | Attending: General Surgery | Admitting: General Surgery

## 2016-01-01 ENCOUNTER — Encounter: Payer: Self-pay | Admitting: *Deleted

## 2016-01-01 DIAGNOSIS — M19049 Primary osteoarthritis, unspecified hand: Secondary | ICD-10-CM | POA: Diagnosis not present

## 2016-01-01 DIAGNOSIS — Z888 Allergy status to other drugs, medicaments and biological substances status: Secondary | ICD-10-CM | POA: Diagnosis not present

## 2016-01-01 DIAGNOSIS — Z452 Encounter for adjustment and management of vascular access device: Secondary | ICD-10-CM | POA: Diagnosis not present

## 2016-01-01 DIAGNOSIS — Z885 Allergy status to narcotic agent status: Secondary | ICD-10-CM | POA: Insufficient documentation

## 2016-01-01 DIAGNOSIS — Z9221 Personal history of antineoplastic chemotherapy: Secondary | ICD-10-CM | POA: Diagnosis not present

## 2016-01-01 DIAGNOSIS — Z803 Family history of malignant neoplasm of breast: Secondary | ICD-10-CM | POA: Diagnosis not present

## 2016-01-01 DIAGNOSIS — C138 Malignant neoplasm of overlapping sites of hypopharynx: Secondary | ICD-10-CM | POA: Diagnosis not present

## 2016-01-01 DIAGNOSIS — Z95828 Presence of other vascular implants and grafts: Secondary | ICD-10-CM

## 2016-01-01 DIAGNOSIS — C140 Malignant neoplasm of pharynx, unspecified: Secondary | ICD-10-CM | POA: Diagnosis not present

## 2016-01-01 DIAGNOSIS — Z85048 Personal history of other malignant neoplasm of rectum, rectosigmoid junction, and anus: Secondary | ICD-10-CM | POA: Diagnosis not present

## 2016-01-01 DIAGNOSIS — C329 Malignant neoplasm of larynx, unspecified: Secondary | ICD-10-CM | POA: Diagnosis not present

## 2016-01-01 DIAGNOSIS — C760 Malignant neoplasm of head, face and neck: Secondary | ICD-10-CM | POA: Diagnosis not present

## 2016-01-01 DIAGNOSIS — R69 Illness, unspecified: Secondary | ICD-10-CM | POA: Diagnosis not present

## 2016-01-01 DIAGNOSIS — Z79899 Other long term (current) drug therapy: Secondary | ICD-10-CM | POA: Diagnosis not present

## 2016-01-01 DIAGNOSIS — Z841 Family history of disorders of kidney and ureter: Secondary | ICD-10-CM | POA: Insufficient documentation

## 2016-01-01 DIAGNOSIS — Z923 Personal history of irradiation: Secondary | ICD-10-CM | POA: Diagnosis not present

## 2016-01-01 DIAGNOSIS — I10 Essential (primary) hypertension: Secondary | ICD-10-CM | POA: Diagnosis not present

## 2016-01-01 DIAGNOSIS — F17210 Nicotine dependence, cigarettes, uncomplicated: Secondary | ICD-10-CM | POA: Insufficient documentation

## 2016-01-01 DIAGNOSIS — J9811 Atelectasis: Secondary | ICD-10-CM | POA: Diagnosis not present

## 2016-01-01 DIAGNOSIS — Z90710 Acquired absence of both cervix and uterus: Secondary | ICD-10-CM | POA: Diagnosis not present

## 2016-01-01 DIAGNOSIS — Z85038 Personal history of other malignant neoplasm of large intestine: Secondary | ICD-10-CM | POA: Insufficient documentation

## 2016-01-01 DIAGNOSIS — R1310 Dysphagia, unspecified: Secondary | ICD-10-CM | POA: Diagnosis not present

## 2016-01-01 DIAGNOSIS — M8580 Other specified disorders of bone density and structure, unspecified site: Secondary | ICD-10-CM | POA: Insufficient documentation

## 2016-01-01 HISTORY — PX: PORTACATH PLACEMENT: SHX2246

## 2016-01-01 HISTORY — DX: Failed or difficult intubation, initial encounter: T88.4XXA

## 2016-01-01 HISTORY — PX: PEG PLACEMENT: SHX5437

## 2016-01-01 SURGERY — INSERTION, TUNNELED CENTRAL VENOUS DEVICE, WITH PORT
Anesthesia: General | Laterality: Right

## 2016-01-01 MED ORDER — EPHEDRINE SULFATE 50 MG/ML IJ SOLN
INTRAMUSCULAR | Status: DC | PRN
  Administered 2016-01-01 (×2): 10 mg via INTRAVENOUS

## 2016-01-01 MED ORDER — LIDOCAINE HCL (PF) 1 % IJ SOLN
INTRAMUSCULAR | Status: DC | PRN
  Administered 2016-01-01: 10 mL

## 2016-01-01 MED ORDER — PROPOFOL 500 MG/50ML IV EMUL
INTRAVENOUS | Status: DC | PRN
  Administered 2016-01-01: 100 ug/kg/min via INTRAVENOUS

## 2016-01-01 MED ORDER — LACTATED RINGERS IV BOLUS (SEPSIS): 500.0000 mL | Freq: Once | INTRAVENOUS | Status: DC

## 2016-01-01 MED ORDER — SODIUM CHLORIDE 0.9 % IJ SOLN
INTRAMUSCULAR | Status: DC | PRN
  Administered 2016-01-01: 10 mL via INTRAVENOUS

## 2016-01-01 MED ORDER — ONDANSETRON HCL 4 MG/2ML IJ SOLN: 4.0000 mg | Freq: Once | INTRAMUSCULAR | Status: DC | PRN

## 2016-01-01 MED ORDER — LIDOCAINE HCL (PF) 1 % IJ SOLN
INTRAMUSCULAR | Status: DC | PRN
  Administered 2016-01-01: 5 mL

## 2016-01-01 MED ORDER — LIDOCAINE HCL (PF) 1 % IJ SOLN
INTRAMUSCULAR | Status: AC
  Filled 2016-01-01: qty 30

## 2016-01-01 MED ORDER — HYDROCODONE-ACETAMINOPHEN 7.5-325 MG/15ML PO SOLN: 10.0000 mL | ORAL | Status: DC | PRN

## 2016-01-01 MED ORDER — MIDAZOLAM HCL 2 MG/2ML IJ SOLN
INTRAMUSCULAR | Status: DC | PRN
  Administered 2016-01-01: 1 mg via INTRAVENOUS

## 2016-01-01 MED ORDER — LIDOCAINE HCL (CARDIAC) 20 MG/ML IV SOLN
INTRAVENOUS | Status: DC | PRN
  Administered 2016-01-01: 30 mg via INTRAVENOUS

## 2016-01-01 MED ORDER — LACTATED RINGERS IV SOLN
INTRAVENOUS | Status: DC
  Administered 2016-01-01: 100 mL/h via INTRAVENOUS
  Administered 2016-01-01: 17:00:00 via INTRAVENOUS

## 2016-01-01 MED ORDER — FENTANYL CITRATE (PF) 100 MCG/2ML IJ SOLN: 25.0000 ug | INTRAMUSCULAR | Status: DC | PRN

## 2016-01-01 MED ORDER — FENTANYL CITRATE (PF) 100 MCG/2ML IJ SOLN
INTRAMUSCULAR | Status: DC | PRN
  Administered 2016-01-01: 25 ug via INTRAVENOUS
  Administered 2016-01-01: 50 ug via INTRAVENOUS
  Administered 2016-01-01: 25 ug via INTRAVENOUS

## 2016-01-01 MED ORDER — SODIUM CHLORIDE 0.9 % IJ SOLN
INTRAMUSCULAR | Status: AC
  Filled 2016-01-01: qty 50

## 2016-01-01 MED ORDER — ACETAMINOPHEN 10 MG/ML IV SOLN
INTRAVENOUS | Status: AC
  Filled 2016-01-01: qty 100

## 2016-01-01 MED ORDER — CEFAZOLIN SODIUM 1-5 GM-% IV SOLN
INTRAVENOUS | Status: AC
  Administered 2016-01-01: 1000 mg
  Filled 2016-01-01: qty 50

## 2016-01-01 MED ORDER — PROPOFOL 10 MG/ML IV BOLUS
INTRAVENOUS | Status: DC | PRN
  Administered 2016-01-01: 20 mg via INTRAVENOUS
  Administered 2016-01-01 (×2): 10 mg via INTRAVENOUS

## 2016-01-01 MED ORDER — ACETAMINOPHEN 10 MG/ML IV SOLN
1000.0000 mg | Freq: Once | INTRAVENOUS | Status: AC
  Administered 2016-01-01: 1000 mg via INTRAVENOUS

## 2016-01-01 SURGICAL SUPPLY — 31 items
BLADE SURG 15 STRL SS SAFETY (BLADE) ×8 IMPLANT
CHLORAPREP W/TINT 26ML (MISCELLANEOUS) ×4 IMPLANT
CLOSURE WOUND 1/2 X4 (GAUZE/BANDAGES/DRESSINGS) ×1
COVER LIGHT HANDLE STERIS (MISCELLANEOUS) ×8 IMPLANT
COVER PROBE FLX POLY STRL (MISCELLANEOUS) ×4 IMPLANT
DECANTER SPIKE VIAL GLASS SM (MISCELLANEOUS) ×8 IMPLANT
DRAPE C-ARM XRAY 36X54 (DRAPES) ×4 IMPLANT
DRAPE LAPAROTOMY TRNSV 106X77 (MISCELLANEOUS) ×4 IMPLANT
DRESSING TELFA 4X3 1S ST N-ADH (GAUZE/BANDAGES/DRESSINGS) ×4 IMPLANT
DRSG TEGADERM 2-3/8X2-3/4 SM (GAUZE/BANDAGES/DRESSINGS) ×4 IMPLANT
DRSG TEGADERM 4X4.75 (GAUZE/BANDAGES/DRESSINGS) ×4 IMPLANT
ELECT REM PT RETURN 9FT ADLT (ELECTROSURGICAL) ×4
ELECTRODE REM PT RTRN 9FT ADLT (ELECTROSURGICAL) ×2 IMPLANT
ENDOVIVE SAFETY PEG KIT ×4 IMPLANT
GLOVE BIO SURGEON STRL SZ7.5 (GLOVE) ×8 IMPLANT
GLOVE INDICATOR 8.0 STRL GRN (GLOVE) ×4 IMPLANT
GOWN STRL REUS W/ TWL LRG LVL3 (GOWN DISPOSABLE) ×4 IMPLANT
GOWN STRL REUS W/TWL LRG LVL3 (GOWN DISPOSABLE) ×4
KIT PORT POWER 8FR ISP CVUE (Catheter) ×4 IMPLANT
KIT RM TURNOVER STRD PROC AR (KITS) ×4 IMPLANT
LABEL OR SOLS (LABEL) ×4 IMPLANT
NEEDLE HYPO 25X1 1.5 SAFETY (NEEDLE) ×4 IMPLANT
NS IRRIG 500ML POUR BTL (IV SOLUTION) ×4 IMPLANT
PACK PORT-A-CATH (MISCELLANEOUS) ×4 IMPLANT
STRIP CLOSURE SKIN 1/2X4 (GAUZE/BANDAGES/DRESSINGS) ×3 IMPLANT
SUT PROLENE 3 0 SH DA (SUTURE) ×4 IMPLANT
SUT VIC AB 3-0 SH 27 (SUTURE) ×2
SUT VIC AB 3-0 SH 27X BRD (SUTURE) ×2 IMPLANT
SUT VIC AB 4-0 FS2 27 (SUTURE) ×4 IMPLANT
SWABSTK COMLB BENZOIN TINCTURE (MISCELLANEOUS) ×4 IMPLANT
SYR CONTROL 10ML (SYRINGE) ×8 IMPLANT

## 2016-01-01 NOTE — Op Note (Signed)
Preoperative diagnosis: Head and neck cancer, need for central venous access  Postoperative diagnosis:ame.  Operative procedure:Right subclavian PowerPort placent with ultrasoundguidance  Operating surgeon: Ollen Bowl, M.D.  Anesthesia attended local, 10 mL 1% Xylocine, plain.  Clinical note: This 82 year old woman has recently been identified with advanced head and neck cancer. Chemo radiation is planned. It is anticipated she will have progressive dysphagia, and a feeding tube has been requested.  Operative note:   In Trendelnberg position the right neck and chest was prepped with Choraprep and draped. The subclavian vein was identified and was patent. Complete collapse was noted during inspiration, improved modestly with a steeper Trendelenberg position. The vein was cannulated followed by guidewire passage. The catheter tip was placed in the SVC/ RA junction and tunneled to a pocket on the right anterior chest.  The catheter was flushed with saline. It easily irrigated and aspirated. The wound was closed with 3-0 Vicryl to the adipose layer and 4-0 Vicryl to the skin as a subcuticular suture. Benzoin, steri-strips, Telfa and Tegaderm applied.  PEG tube was placed in conjunction with Dr. Jamal Collin.  Post procedure CXR showed free air,consistent with PEG placement. No pneumothorax. Catheter tip as described as above.

## 2016-01-01 NOTE — Op Note (Signed)
Procedure- Insertion gastrostomy tube...  Diagnosis: head and neck cancer with dysphagia  Endoscopy was performed by Dr. Bary Castilla. No difficulty in traversing the esophagus. Anterior stomach wall was identified by pressures from the abdominal wall and vuisible light. 1% xylocaine and Chloro Prep used. 7-39mm incision made. Needle passes into stomach followed by the guide wire. This was grasped with a snare via endoscope and pulled through the mouth. 20 fr G tube was then place by pull thru technique. Skin shield applied, cap placed after trimming catheter. Skin mark  Was 2.5 cm at the shield. Dressing applied.

## 2016-01-01 NOTE — Progress Notes (Signed)
Dr. Bary Castilla Paged , Pt ready for discharge

## 2016-01-01 NOTE — Anesthesia Preprocedure Evaluation (Addendum)
Anesthesia Evaluation  Patient identified by MRN, date of birth, ID band Patient awake    Reviewed: Allergy & Precautions, NPO status , Patient's Chart, lab work & pertinent test results, reviewed documented beta blocker date and time   Airway Mallampati: II  TM Distance: >3 FB     Dental  (+) Upper Dentures, Partial Lower   Pulmonary Current Smoker,    Pulmonary exam normal breath sounds clear to auscultation       Cardiovascular hypertension, Pt. on medications and Pt. on home beta blockers Normal cardiovascular exam     Neuro/Psych negative neurological ROS  negative psych ROS   GI/Hepatic Neg liver ROS, GERD  Medicated and Controlled,Rectal CA   Endo/Other  negative endocrine ROS  Renal/GU negative Renal ROS  negative genitourinary   Musculoskeletal  (+) Arthritis , Osteoarthritis,    Abdominal Normal abdominal exam  (+)   Peds negative pediatric ROS (+)  Hematology negative hematology ROS (+)   Anesthesia Other Findings Squamous cell of hypopharynx  Reproductive/Obstetrics                          Anesthesia Physical  Anesthesia Plan  ASA: III  Anesthesia Plan: General   Post-op Pain Management:    Induction: Intravenous  Airway Management Planned: Nasal Cannula  Additional Equipment:   Intra-op Plan:   Post-operative Plan: Extubation in OR  Informed Consent: I have reviewed the patients History and Physical, chart, labs and discussed the procedure including the risks, benefits and alternatives for the proposed anesthesia with the patient or authorized representative who has indicated his/her understanding and acceptance.   Dental advisory given  Plan Discussed with: CRNA and Surgeon  Anesthesia Plan Comments:        Anesthesia Quick Evaluation

## 2016-01-01 NOTE — Transfer of Care (Signed)
Immediate Anesthesia Transfer of Care Note  Patient: Emily [name]  Procedure(s) Performed: Procedure(s): INSERTION PORT-A-CATH (Right) PERCUTANEOUS ENDOSCOPIC GASTROSTOMY (PEG) PLACEMENT (N/A)  Patient Location: PACU  Anesthesia Type:General  Level of Consciousness: awake and patient cooperative  Airway & Oxygen Therapy: Patient Spontanous Breathing and Patient connected to nasal cannula oxygen  Post-op Assessment: Report given to RN  Post vital signs: Reviewed and stable  Last Vitals:  Filed Vitals:   01/01/16 1426 01/01/16 1705  BP: 137/57 138/49  Pulse: 86 70  Temp: 36.1 C 36.1 C  Resp: 16 12    Complications: No apparent anesthesia complications

## 2016-01-01 NOTE — Progress Notes (Signed)
500 ml infusion complete , pt. Tolerated well , denies pain .VSS.

## 2016-01-01 NOTE — Op Note (Signed)
[facility]enterology Patient Name: Emily [name] Procedure Date: 01/01/2016 2:01 PM MRN: [id number] Account #: [id number] Date of Birth: 1933/06/27 Admit Type: Outpatient Age: 82 Room: OR 7 Gender: Female Note Status: Finalized Procedure:            Upper GI endoscopy Indications:          Dysphagia Providers:            [name], MD Referring MD:         [facility]Barton Dubois (Referring MD), [name], MD (Referring MD) Medicines:            Monitored Anesthesia Care Complications:        No immediate complications. Procedure:            Pre-Anesthesia Assessment:                       - Prior to the procedure, a History and Physical was                        performed, and patient medications, allergies and                        sensitivities were reviewed. The patient's tolerance of                        previous anesthesia was reviewed.                       - The risks and benefits of the procedure and the                        sedation options and risks were discussed with the                        patient. All questions were answered and informed                        consent was obtained.                       After obtaining informed consent, the endoscope was                        passed under direct vision. Throughout the procedure,                        the patient's blood pressure, pulse, and oxygen                        saturations were monitored continuously. The Endoscope                        was introduced through the mouth, and advanced to the                        second part of duodenum. The Endoscope was introduced  through the mouth, and advanced to the second part of                        duodenum. The upper GI endoscopy was accomplished                        without difficulty. The patient tolerated the procedure                         well. Findings:      A large infiltrative mass was found in the right pyriform sinus. The       mass is not obstructing the airway. The esophagus was cannulated without       difficulty.      The stomach was normal.      The examined duodenum was normal.      The esophagus was normal.      PEG tube placed per oral with Dr. Jamal Collin. Impression:           - Large infiltrative mass found in the right pyriform                        sinus, not obstructing the airway. This lesion is                        malignant.                       - Normal stomach.                       - Normal examined duodenum.                       - Normal esophagus.                       - No specimens collected. Recommendation:       - Discharge patient to home (via wheelchair).                       - The findings and recommendations were discussed with                        the patient's family. 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 All rights reserved. The codes documented in this report are preliminary and upon coder review  may  be revised to meet current compliance requirements. [name], MD 01/01/2016 5:05:07 PM This report has been signed electronically. Number of Addenda: 0 Note Initiated On: 01/01/2016 2:01 PM      [facility]

## 2016-01-01 NOTE — H&P (Signed)
No change since yesterday's exam.  Reviewed plans for port (right or left depending on anatomy) and PEG tube placement. If she does well, she will be able to be discharged home.  Sedation plans reviewed with anesthesia.

## 2016-01-01 NOTE — Anesthesia Postprocedure Evaluation (Signed)
Anesthesia Post Note  Patient: Emily [name]  Procedure(s) Performed: Procedure(s) (LRB): INSERTION PORT-A-CATH (Right) PERCUTANEOUS ENDOSCOPIC GASTROSTOMY (PEG) PLACEMENT (N/A)  Patient location during evaluation: PACU Anesthesia Type: General Level of consciousness: awake and alert Pain management: pain level controlled Vital Signs Assessment: post-procedure vital signs reviewed and stable Respiratory status: spontaneous breathing, nonlabored ventilation, respiratory function stable and patient connected to nasal cannula oxygen Cardiovascular status: blood pressure returned to baseline and stable Postop Assessment: no signs of nausea or vomiting Anesthetic complications: no    Last Vitals:  Filed Vitals:   01/01/16 1854 01/01/16 1907  BP: 143/50 142/47  Pulse:  54  Temp:    Resp:  12    Last Pain:  Filed Vitals:   01/01/16 1907  PainSc: [place]

## 2016-01-01 NOTE — Discharge Instructions (Signed)

## 2016-01-02 ENCOUNTER — Inpatient Hospital Stay: Payer: Medicare Other

## 2016-01-02 ENCOUNTER — Encounter: Payer: Self-pay | Admitting: General Surgery

## 2016-01-02 ENCOUNTER — Telehealth: Payer: Self-pay | Admitting: *Deleted

## 2016-01-02 NOTE — Telephone Encounter (Signed)
-----   Message from [name], MD sent at 01/02/2016 11:09 AM EST ----- Patient had port and PEG tube placement yesterday. Give pt/ family a call and see how she is doing. Thanks.

## 2016-01-02 NOTE — Telephone Encounter (Signed)
She states she is "fair to middling", she states she is sore but no other complaints. Denies n/v. She has not eat breakfast as of yet. She appreciates call.

## 2016-01-05 ENCOUNTER — Encounter: Payer: Self-pay | Admitting: Oncology

## 2016-01-05 NOTE — Progress Notes (Signed)
[place] @ [facility] Telephone:([phone]  Fax:([phone]) [place]: 1933-08-23  MR#: [id number]  LOV#:[id number]  Patient Care Team: [name], MD as PCP - General (Family Medicine) [name], MD (General Surgery) [name], MD (Unknown Physician Specialty) [name], MD (Family Medicine) [name], MD (General Surgery)  CHIEF COMPLAINT:  Chief Complaint  Patient presents with  . Cancer of contiguous sites of hypopharynx    Oncology History   rectal cancer T3, N1, M0 tumor on chemoradiation therapy Hospitalizations secondary to diarrhea(April, 2014) chemoradiation intrupted. C. difficile diarrhea. 2.Status post abdominal perineal resection pT3  pNO stage II  disease.  (As September, 2014) patient underwent surgery at [facility] 3.  Patient could not tolerate post operative chemotherapy FOLFOX     Rectal cancer ([place])   01/20/2013 Initial Diagnosis Rectal cancer   4.  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 Starting radiation therapy and chemotherapy from January 13, 2016.  Cis-platinum with radiation treatment INTERVAL HISTORY: 82 year old lady who has been seen by me previously with a history of rectal cancer.  He sent had been a chronic smoker and claims that has recently stopped smoking.  Since last November patient has noticed a mass in the right side of the neck.  Was treated with antibiotics without much improvement patient also has noticed hoarseness of voice.  His last month or so patient has progressive weight loss (approximately 12 pounds since last evaluation) Increasing pain in the right ear.  Pain for swallowing.  Patient was started on liquid hydrocodone and was  evaluated by ENT surgeon.  On evaluation patient was found to have endophytic lesion of the right upper pyriform sinus involving posterior and lateral hypopharynx with extension to the medial hypopharynx and larynx with a right vocal cord which appeared to be immobile Patient was referred to me at this point in time after PET scan for further evaluation and treatment consideration Patient is being evaluated by surgeon for PEG tube placement and port placement. Family had number of questions.  Radiation therapy to be started from March 13.  Planning being done.  Patient continues severe sore throat and inability to drink or eat    REVIEW OF SYSTEMS:   Gen. status: Patient is gradually declining and losing weight as described about has lost 12 pounds of weight HEENT: As described about has hoarseness of voice.  Palpable lymph node on the right side of the neck.  Difficulty in swallowing.  An ENT evaluation is described about.  Patient recently had hemoptysis and was in the emergency room Lungs: No cough no shortness of breath patient has stopped smoking very recently Cardiac: No chest pain GI: Increasing difficulty in swallowing.  Patient had a recent colonoscopy done by Dr. Charlean Sanfilippo and no evidence of malignancy was found Skin: No rash Lower extremity: No swelling Neurological system: No headache no dizziness Family is reported progressive dementia All other systems have been reviewed patient has been accompanied with her son and daughter-in-law PAST MEDICAL HISTORY: Past Medical History  Diagnosis Date  . Status post chemotherapy     colon cancer  . Status post radiation therapy     colon cancer 2013  . Hypertension   .  Diverticulosis   . Hemorrhoids   . Osteopenia   . History of chicken pox   . Arthritis     fingers  . Cancer ([place])     rectal  . Cancer ([place]) 2012    colon cancer  . Cancer of contiguous sites of hypopharynx ([place]) 12/23/2015  . Difficult intubation     PAST  SURGICAL HISTORY: Past Surgical History  Procedure Laterality Date  . Appendectomy    . Abdominal hysterectomy    . Tonsillectomy    . Eus N/A 01/20/2013    Procedure: LOWER ENDOSCOPIC ULTRASOUND (EUS);  Surgeon: Milus Banister, MD;  Location: [facility];  Service: Endoscopy;  Laterality: N/A;  . Colonoscopy    . Colon surgery  06/28/2013    resected tumor from colon; [facility]  . Appendectomy  [id number]    Dr. Pat Patrick  . Abdominal hysterectomy  1980    partial  . Dilation and curettage of uterus  1957  . Colonoscopy N/A 10/01/2015    Procedure: COLONOSCOPY;  Surgeon: [name], MD;  Location: [facility] ENDOSCOPY;  Service: Endoscopy;  Laterality: N/A;  . Direct laryngoscopy N/A 12/11/2015    Procedure: DIRECT LARYNGOSCOPY;  Surgeon: Clyde Canterbury, MD;  Location: [facility] ORS;  Service: ENT;  Laterality: N/A;  . Esophagoscopy N/A 12/11/2015    Procedure: ESOPHAGOSCOPY;  Surgeon: Clyde Canterbury, MD;  Location: [facility] ORS;  Service: ENT;  Laterality: N/A;  . Breast surgery      cyst removal  . Breast cyst aspiration Bilateral     negative  . Breast surgery  1960    Breast Biopsy  . Portacath placement Right 01/01/2016    Procedure: INSERTION PORT-A-CATH;  Surgeon: [name], MD;  Location: [facility] ORS;  Service: General;  Laterality: Right;  . Peg placement N/A 01/01/2016    Procedure: PERCUTANEOUS ENDOSCOPIC GASTROSTOMY (PEG) PLACEMENT;  Surgeon: [name], MD;  Location: [facility] ORS;  Service: General;  Laterality: N/A;    FAMILY HISTORY Family History  Problem Relation Age of Onset  . Breast cancer Maternal Grandmother   . Kidney failure Father     ADVANCED DIRECTIVES:  Patient does have advance healthcare directive, Patient   does not desire to make any changes  HEALTH MAINTENANCE: Social History  Substance Use Topics  . Smoking status: Current Every Day Smoker -- 1.00 packs/day for 64 years    Types: Cigarettes  . Smokeless tobacco: Never Used  .  Alcohol Use: 0.0 oz/week    0 Standard drinks or equivalent per week     Comment: rarely      Allergies  Allergen Reactions  . Amlodipine Besylate Other (See Comments)    Patient unaware of any allergy with this medicine  . Oxycodone     confusion    Current Outpatient Prescriptions  Medication Sig Dispense Refill  . HYDROcodone-acetaminophen (HYCET) 7.5-325 mg/15 ml solution 10-15 cc every 4-6 hours as needed for pain 300 mL 0  . Melatonin 2.5 MG CAPS Take 1 capsule by mouth at bedtime as needed.     . metoprolol succinate (TOPROL-XL) 25 MG 24 hr tablet TAKE ONE-HALF TABLET BY MOUTH ONCE DAILY* NEEDS OFFICE VISIT* 90 tablet 3  . Multiple Vitamins-Minerals (MULTIVITAMIN ADULT PO) Take 1 tablet by mouth daily. Reported on 12/30/2015    . Nutritional Supplements (ENSURE PLUS PO) Take by mouth.    . nystatin (MYCOSTATIN) 100000 UNIT/ML suspension Reported on 12/30/2015  0   No current facility-administered medications for  this visit.    OBJECTIVE:  Filed Vitals:   12/31/15 1455  BP: 144/75  Pulse: 73  Temp: 95.2 F (35.1 C)     Body mass index is 19.19 kg/(m^2).    ECOG FS:1 - Symptomatic but completely ambulatory  PHYSICAL EXAM: Gneral  status: Patient has declining performance status HEENT: No evidence of stomatitis. Sclera and conjunctivae :: No jaundice.   pale looking. Lungs: Air  entry equal on both sides.  No rhonchi.  No rales.  Cardiac: Heart sounds are normal.  No pericardial rub.  No murmur. Lymphatic system: palpable  cervical lymph node approximately 2 cm.  No other lymphadenopathy GI: Abdomen is soft.  No ascites.  Liver spleen not palpable.  No tenderness.  Bowel sounds are within normal limit Lower extremity: No edema Neurological system: Higher functions, cranial nerves intact no evidence of peripheral neuropathy. Skin: No rash.  No ecchymosis.Marland Kitchen   LAB RESULTS:  No visits with results within 2 Day(s) from this visit. Latest known visit with results  is:  Admission on 12/21/2015, Discharged on 12/21/2015  Component Date Value Ref Range Status  . WBC 12/21/2015 8.4  3.6 - 11.0 K/uL Final  . RBC 12/21/2015 3.95  3.80 - 5.20 MIL/uL Final  . Hemoglobin 12/21/2015 12.0  12.0 - 16.0 g/dL Final  . HCT 45/79/3965 36.5  35.0 - 47.0 % Final  . MCV 12/21/2015 92.5  80.0 - 100.0 fL Final  . MCH 12/21/2015 30.5  26.0 - 34.0 pg Final  . MCHC 12/21/2015 33.0  32.0 - 36.0 g/dL Final  . RDW 03/27/7987 14.1  11.5 - 14.5 % Final  . Platelets 12/21/2015 230  150 - 440 K/uL Final  . Neutrophils Relative % 12/21/2015 83   Final  . Neutro Abs 12/21/2015 6.9* 1.4 - 6.5 K/uL Final  . Lymphocytes Relative 12/21/2015 10   Final  . Lymphs Abs 12/21/2015 0.9* 1.0 - 3.6 K/uL Final  . Monocytes Relative 12/21/2015 7   Final  . Monocytes Absolute 12/21/2015 0.6  0.2 - 0.9 K/uL Final  . Eosinophils Relative 12/21/2015 0   Final  . Eosinophils Absolute 12/21/2015 0.0  0 - 0.7 K/uL Final  . Basophils Relative 12/21/2015 0   Final  . Basophils Absolute 12/21/2015 0.0  0 - 0.1 K/uL Final  . Sodium 12/21/2015 141  135 - 145 mmol/L Final   LYTES REPEATED  . Potassium 12/21/2015 4.0  3.5 - 5.1 mmol/L Final  . Chloride 12/21/2015 107  101 - 111 mmol/L Final  . CO2 12/21/2015 31  22 - 32 mmol/L Final  . Glucose, Bld 12/21/2015 119* 65 - 99 mg/dL Final  . BUN 76/78/1183 24* 6 - 20 mg/dL Final  . Creatinine, Ser 12/21/2015 1.04* 0.44 - 1.00 mg/dL Final  . Calcium 74/00/2140 10.2  8.9 - 10.3 mg/dL Final  . Total Protein 12/21/2015 6.8  6.5 - 8.1 g/dL Final  . Albumin 69/14/5886 3.5  3.5 - 5.0 g/dL Final  . AST 91/91/2443 28  15 - 41 U/L Final  . ALT 12/21/2015 28  14 - 54 U/L Final  . Alkaline Phosphatase 12/21/2015 73  38 - 126 U/L Final  . Total Bilirubin 12/21/2015 0.5  0.3 - 1.2 mg/dL Final  . GFR calc non Af Amer 12/21/2015 49* >60 mL/min Final  . GFR calc Af Amer 12/21/2015 57* >60 mL/min Final   Comment: (NOTE) The eGFR has been calculated using the CKD EPI  equation. This calculation has not been validated in  all clinical situations. eGFR's persistently <60 mL/min signify possible Chronic Kidney Disease.   . Anion gap 12/21/2015 3* 5 - 15 Final      STUDIES: Dg Chest 2 View  12/21/2015  CLINICAL DATA:  Sore throat and hemoptysis. Recent laryngoscopy 12/11/2015 EXAM: CHEST  2 VIEW COMPARISON:  PET-CT 2 days prior 12/19/2015 FINDINGS: The cardiomediastinal contours are normal. Aortic atherosclerosis noted. Small nodular opacity in the right upper lung, not hypermetabolic on recent PET. Pulmonary vasculature is normal. No consolidation, pleural effusion, or pneumothorax. No acute osseous abnormalities are seen. Remote left rib fracture. IMPRESSION: No acute pulmonary process. Electronically Signed   By: Jeb Levering M.D.   On: 12/21/2015 16:21   Dg Esophagus  12/30/2015  CLINICAL DATA:  Difficulty swallowing. Weight loss. Head neck cancer. EXAM: ESOPHOGRAM / BARIUM SWALLOW / BARIUM TABLET STUDY TECHNIQUE: Combined double contrast and single contrast examination performed using effervescent crystals, thick barium liquid, and thin barium liquid. The patient was observed with fluoroscopy swallowing a 13 mm barium sulphate tablet. FLUOROSCOPY TIME:  Radiation Exposure Index (as provided by the fluoroscopic device): 18.2 mGy COMPARISON:  PET-CT 12/19/2015. FINDINGS: Deformity of the right pharynx noted consistent patient's known diagnosis of malignancy. No obstructing lesions identified. Mild barium aspiration was present. Thoracic esophagus is normal. No obstructing lesion. Normal peristalsis. No hiatal hernia. No reflux. IMPRESSION: 1. Deformity of the right hypopharynx noted consistent with patient's known diagnosis of head neck malignancy. No obstructing lesion noted. Mild barium aspiration into the trachea. 2. Exam otherwise unremarkable . Thoracic esophagus is normal. No reflux. Electronically Signed   By: Marcello Moores  Register   On: 12/30/2015 13:26   Nm  Pet Image Initial (pi) Skull Base To Thigh  12/19/2015  CLINICAL DATA:  Subsequent treatment strategy for malignant neoplasm of the nasal cavity. EXAM: NUCLEAR MEDICINE PET SKULL BASE TO THIGH TECHNIQUE: 12.47 mCi F-18 FDG was injected intravenously. Full-ring PET imaging was performed from the skull base to thigh after the radiotracer. CT data was obtained and used for attenuation correction and anatomic localization. FASTING BLOOD GLUCOSE:  Value: 105 mg/dl COMPARISON:  01/09/2013 FINDINGS: NECK Hypermetabolic lesion is identified at the in a hole a corresponding with the recently biopsied right lateral pharyngeal wall lesion extending into the piriform sinus. The SUV max associated with this lesion is equal to 16.7. Enlarged and hypermetabolic right level-II node measures 1.7 cm and has an SUV max equal to 11.57. Calcified atherosclerotic change involves the carotid arteries. CHEST No hypermetabolic mediastinal or hilar nodes. Aortic atherosclerosis noted. Calcification involving the RCA, LAD coronary arteries noted. No hypermetabolic mediastinal or hilar nodes identified. No suspicious pulmonary nodules on the CT scan. ABDOMEN/PELVIS No abnormal hypermetabolic activity within the liver, pancreas, adrenal glands, or spleen. 1 cm gallstone identified. No hypermetabolic lymph nodes in the abdomen or pelvis. SKELETON No focal hypermetabolic activity to suggest skeletal metastasis. IMPRESSION: 1. The right lateral pharyngeal wall lesion is intensely hypermetabolic compatible with primary head/neck carcinoma. 2. Hypermetabolic ipsilateral level 2 cervical lymph node. 3. Aortic atherosclerosis as well as coronary artery calcification 4. Gallstone. Electronically Signed   By: Kerby Moors M.D.   On: 12/19/2015 14:47   Dg Chest Port 1 View  01/01/2016  CLINICAL DATA:  MediPort placement EXAM: PORTABLE CHEST 1 VIEW COMPARISON:  12/21/2015 chest radiograph. FINDINGS: Right subclavian MediPort terminates at the  cavoatrial junction. Stable cardiomediastinal silhouette with top-normal heart size. No pneumothorax. No pleural effusion. Mild left basilar atelectasis. No pulmonary edema. No acute consolidative airspace disease.  There is free intraperitoneal air under the right hemidiaphragm. IMPRESSION: 1. Well-positioned right subclavian MediPort.  No pneumothorax. 2. Mild left basilar atelectasis. 3. Free intraperitoneal air under the right hemidiaphragm. Per discussion with Dr. Bary Castilla, the patient also had a percutaneous gastrostomy tube placed, accounting for the free air. Any imaging follow-up should be performed as clinically warranted. These results were called by telephone at the time of interpretation on 01/01/2016 at 5:33 pm to Dr. Hervey Ard , who verbally acknowledged these results. Electronically Signed   By: Ilona Sorrel M.D.   On: 01/01/2016 17:39   Dg C-arm 1-60 Min-no Report  01/01/2016  CLINICAL DATA: port a cath insertion C-ARM 1-60 MINUTES Fluoroscopy was utilized by the requesting physician.  No radiographic interpretation.    ASSESSMENT: 1.  Carcinoma of hypopharynx extending to multiple sites with vocal cord being immobile (pyriform sinus tumor extending to the vocal cord and lateral pharyngeal wall) T3 N1 M0 tumor stage III Complete extent of the disease not known because of laryngoscopy examination was admitted because of frequent gag reflex.  PET scan shows extensive tumor invading lateral  pharyngeal wall and larynx Positive level II cervical lymph node just clinically palpable and less than 3 cm 2.  Significant weight loss 3.  History of chronic smoking which patient has just stopped smoking recently 4.  Rectal cancer with negative colonoscopy in November and stable CEA  Proceed with PEG tube placement as well as port placement. Definitive radiation therapy along with cisplatin therapy from March 13   Patient expressed understanding and was in agreement with this plan. She also  understands that She can call clinic at any time with any questions, concerns, or complaints.    No matching staging information was found for the patient.  [name], MD   01/05/2016 6:56 AM

## 2016-01-07 ENCOUNTER — Other Ambulatory Visit: Payer: Self-pay | Admitting: *Deleted

## 2016-01-07 DIAGNOSIS — C12 Malignant neoplasm of pyriform sinus: Secondary | ICD-10-CM | POA: Diagnosis not present

## 2016-01-07 DIAGNOSIS — Z510 Encounter for antineoplastic radiation therapy: Secondary | ICD-10-CM | POA: Diagnosis not present

## 2016-01-07 DIAGNOSIS — C139 Malignant neoplasm of hypopharynx, unspecified: Secondary | ICD-10-CM

## 2016-01-09 ENCOUNTER — Ambulatory Visit
Admission: RE | Admit: 2016-01-09 | Discharge: 2016-01-09 | Disposition: A | Discharge status: Home / Self Care | Payer: Medicare Other | Source: Ambulatory Visit | Attending: Radiation Oncology | Admitting: Radiation Oncology

## 2016-01-09 ENCOUNTER — Telehealth: Payer: Self-pay | Admitting: General Surgery

## 2016-01-09 ENCOUNTER — Other Ambulatory Visit: Payer: Medicare Other

## 2016-01-09 DIAGNOSIS — C139 Malignant neoplasm of hypopharynx, unspecified: Secondary | ICD-10-CM | POA: Diagnosis not present

## 2016-01-09 DIAGNOSIS — Z510 Encounter for antineoplastic radiation therapy: Secondary | ICD-10-CM | POA: Diagnosis not present

## 2016-01-09 DIAGNOSIS — C12 Malignant neoplasm of pyriform sinus: Secondary | ICD-10-CM | POA: Diagnosis not present

## 2016-01-09 NOTE — Telephone Encounter (Signed)
DAUGHTER- IN- LAW (TERESA RITCHER) PLEASE CALL HER @ [phone].PT HAD FEEDING TUBE PLACED ON 01-01-16 BY DR BYRNETT.TODAY PT HAS STATED SHE IS HAVING YELLOW COMING FROM TUBE & NO REDNESS AROUND AREA.SHE THINKS IT IS ENSURE. DAUGHTER- IN-LAW HAS NOT SEEN AREA SINCE Sunday.(AREA LOOKED FINE THEN).PT HAS AN APPT TODAY @ 2:45 TO WITH DR CHRYSTAL & DIDN'T KNOW IF DR BYRNETT WANT TO SEE HER OR LET HIM VIEW THE AREA.

## 2016-01-09 NOTE — Telephone Encounter (Signed)
Aware to make sure Dr Baruch Gouty or his nurse sees the area today at her appointment and if needed they can call our office or Dr Bary Castilla (he is in surgery)

## 2016-01-10 ENCOUNTER — Telehealth: Payer: Self-pay | Admitting: *Deleted

## 2016-01-10 ENCOUNTER — Inpatient Hospital Stay: Payer: Medicare Other | Attending: Oncology

## 2016-01-10 DIAGNOSIS — Z85038 Personal history of other malignant neoplasm of large intestine: Secondary | ICD-10-CM | POA: Diagnosis not present

## 2016-01-10 DIAGNOSIS — Z87891 Personal history of nicotine dependence: Secondary | ICD-10-CM | POA: Diagnosis not present

## 2016-01-10 DIAGNOSIS — Z8719 Personal history of other diseases of the digestive system: Secondary | ICD-10-CM | POA: Insufficient documentation

## 2016-01-10 DIAGNOSIS — M1990 Unspecified osteoarthritis, unspecified site: Secondary | ICD-10-CM | POA: Diagnosis not present

## 2016-01-10 DIAGNOSIS — Z95828 Presence of other vascular implants and grafts: Secondary | ICD-10-CM | POA: Diagnosis not present

## 2016-01-10 DIAGNOSIS — Z85048 Personal history of other malignant neoplasm of rectum, rectosigmoid junction, and anus: Secondary | ICD-10-CM | POA: Diagnosis not present

## 2016-01-10 DIAGNOSIS — I10 Essential (primary) hypertension: Secondary | ICD-10-CM | POA: Diagnosis not present

## 2016-01-10 DIAGNOSIS — R042 Hemoptysis: Secondary | ICD-10-CM | POA: Insufficient documentation

## 2016-01-10 DIAGNOSIS — C139 Malignant neoplasm of hypopharynx, unspecified: Secondary | ICD-10-CM | POA: Insufficient documentation

## 2016-01-10 DIAGNOSIS — M129 Arthropathy, unspecified: Secondary | ICD-10-CM | POA: Insufficient documentation

## 2016-01-10 DIAGNOSIS — H9201 Otalgia, right ear: Secondary | ICD-10-CM | POA: Insufficient documentation

## 2016-01-10 DIAGNOSIS — K8020 Calculus of gallbladder without cholecystitis without obstruction: Secondary | ICD-10-CM | POA: Insufficient documentation

## 2016-01-10 DIAGNOSIS — R1310 Dysphagia, unspecified: Secondary | ICD-10-CM | POA: Insufficient documentation

## 2016-01-10 DIAGNOSIS — Z923 Personal history of irradiation: Secondary | ICD-10-CM | POA: Insufficient documentation

## 2016-01-10 DIAGNOSIS — Z5111 Encounter for antineoplastic chemotherapy: Secondary | ICD-10-CM | POA: Insufficient documentation

## 2016-01-10 DIAGNOSIS — J029 Acute pharyngitis, unspecified: Secondary | ICD-10-CM | POA: Insufficient documentation

## 2016-01-10 DIAGNOSIS — Z431 Encounter for attention to gastrostomy: Secondary | ICD-10-CM | POA: Diagnosis not present

## 2016-01-10 DIAGNOSIS — R634 Abnormal weight loss: Secondary | ICD-10-CM | POA: Insufficient documentation

## 2016-01-10 DIAGNOSIS — E876 Hypokalemia: Secondary | ICD-10-CM | POA: Insufficient documentation

## 2016-01-10 DIAGNOSIS — R197 Diarrhea, unspecified: Secondary | ICD-10-CM | POA: Insufficient documentation

## 2016-01-10 MED ORDER — HYDROCODONE-ACETAMINOPHEN 7.5-325 MG/15ML PO SOLN: ORAL | Status: DC

## 2016-01-10 NOTE — Telephone Encounter (Signed)
Escribed

## 2016-01-10 NOTE — Progress Notes (Unsigned)
Patient nor family showed up for chemo class

## 2016-01-13 ENCOUNTER — Inpatient Hospital Stay (HOSPITAL_BASED_OUTPATIENT_CLINIC_OR_DEPARTMENT_OTHER): Payer: Medicare Other | Admitting: Oncology

## 2016-01-13 ENCOUNTER — Inpatient Hospital Stay: Payer: Medicare Other

## 2016-01-13 ENCOUNTER — Encounter: Payer: Self-pay | Admitting: Oncology

## 2016-01-13 ENCOUNTER — Ambulatory Visit: Payer: Medicare Other

## 2016-01-13 VITALS — BP 148/72 | HR 87 | Temp 98.4°F | Ht 65.0 in | Wt 114.2 lb

## 2016-01-13 DIAGNOSIS — J029 Acute pharyngitis, unspecified: Secondary | ICD-10-CM

## 2016-01-13 DIAGNOSIS — C139 Malignant neoplasm of hypopharynx, unspecified: Secondary | ICD-10-CM | POA: Diagnosis not present

## 2016-01-13 DIAGNOSIS — Z923 Personal history of irradiation: Secondary | ICD-10-CM

## 2016-01-13 DIAGNOSIS — R197 Diarrhea, unspecified: Secondary | ICD-10-CM | POA: Diagnosis not present

## 2016-01-13 DIAGNOSIS — M129 Arthropathy, unspecified: Secondary | ICD-10-CM | POA: Diagnosis not present

## 2016-01-13 DIAGNOSIS — Z8719 Personal history of other diseases of the digestive system: Secondary | ICD-10-CM | POA: Diagnosis not present

## 2016-01-13 DIAGNOSIS — C20 Malignant neoplasm of rectum: Secondary | ICD-10-CM

## 2016-01-13 DIAGNOSIS — H9201 Otalgia, right ear: Secondary | ICD-10-CM

## 2016-01-13 DIAGNOSIS — R1310 Dysphagia, unspecified: Secondary | ICD-10-CM

## 2016-01-13 DIAGNOSIS — K8020 Calculus of gallbladder without cholecystitis without obstruction: Secondary | ICD-10-CM | POA: Diagnosis not present

## 2016-01-13 DIAGNOSIS — R634 Abnormal weight loss: Secondary | ICD-10-CM | POA: Diagnosis not present

## 2016-01-13 DIAGNOSIS — E876 Hypokalemia: Secondary | ICD-10-CM | POA: Diagnosis not present

## 2016-01-13 DIAGNOSIS — Z85048 Personal history of other malignant neoplasm of rectum, rectosigmoid junction, and anus: Secondary | ICD-10-CM | POA: Diagnosis not present

## 2016-01-13 DIAGNOSIS — I10 Essential (primary) hypertension: Secondary | ICD-10-CM

## 2016-01-13 DIAGNOSIS — Z87891 Personal history of nicotine dependence: Secondary | ICD-10-CM | POA: Diagnosis not present

## 2016-01-13 DIAGNOSIS — Z5111 Encounter for antineoplastic chemotherapy: Secondary | ICD-10-CM | POA: Diagnosis not present

## 2016-01-13 DIAGNOSIS — R042 Hemoptysis: Secondary | ICD-10-CM | POA: Diagnosis not present

## 2016-01-13 LAB — COMPREHENSIVE METABOLIC PANEL
ALK PHOS: 68 U/L (ref 38–126)
ALT: 23 U/L (ref 14–54)
ANION GAP: 7 (ref 5–15)
AST: 25 U/L (ref 15–41)
Albumin: 3.4 g/dL — ABNORMAL LOW (ref 3.5–5.0)
BUN: 21 mg/dL — ABNORMAL HIGH (ref 6–20)
CALCIUM: 9.6 mg/dL (ref 8.9–10.3)
CHLORIDE: 103 mmol/L (ref 101–111)
CO2: 27 mmol/L (ref 22–32)
Creatinine, Ser: 1.11 mg/dL — ABNORMAL HIGH (ref 0.44–1.00)
GFR calc non Af Amer: 46 mL/min — ABNORMAL LOW (ref >60)
GFR, EST AFRICAN AMERICAN: 53 mL/min — AB (ref >60)
Glucose, Bld: 192 mg/dL — ABNORMAL HIGH (ref 65–99)
Potassium: 3.2 mmol/L — ABNORMAL LOW (ref 3.5–5.1)
SODIUM: 137 mmol/L (ref 135–145)
Total Bilirubin: 0.5 mg/dL (ref 0.3–1.2)
Total Protein: 7 g/dL (ref 6.5–8.1)

## 2016-01-13 LAB — CBC WITH DIFFERENTIAL/PLATELET
Basophils Absolute: 0 10*3/uL (ref 0–0.1)
Basophils Relative: 1 %
EOS ABS: 0 10*3/uL (ref 0–0.7)
EOS PCT: 1 %
HCT: 31.4 % — ABNORMAL LOW (ref 35.0–47.0)
Hemoglobin: 10.6 g/dL — ABNORMAL LOW (ref 12.0–16.0)
LYMPHS ABS: 1.4 10*3/uL (ref 1.0–3.6)
Lymphocytes Relative: 16 %
MCH: 31.5 pg (ref 26.0–34.0)
MCHC: 33.9 g/dL (ref 32.0–36.0)
MCV: 92.9 fL (ref 80.0–100.0)
MONO ABS: 0.5 10*3/uL (ref 0.2–0.9)
Monocytes Relative: 6 %
Neutro Abs: 6.7 10*3/uL — ABNORMAL HIGH (ref 1.4–6.5)
Neutrophils Relative %: 76 %
PLATELETS: 224 10*3/uL (ref 150–440)
RBC: 3.38 MIL/uL — ABNORMAL LOW (ref 3.80–5.20)
RDW: 14.2 % (ref 11.5–14.5)
WBC: 8.7 10*3/uL (ref 3.6–11.0)

## 2016-01-13 LAB — MAGNESIUM: Magnesium: 2 mg/dL (ref 1.7–2.4)

## 2016-01-13 MED ORDER — HEPARIN SOD (PORK) LOCK FLUSH 100 UNIT/ML IV SOLN
500.0000 [IU] | Freq: Once | INTRAVENOUS | Status: AC | PRN
  Administered 2016-01-13: 500 [IU]
  Filled 2016-01-13 (×2): qty 5

## 2016-01-13 MED ORDER — POTASSIUM CHLORIDE 2 MEQ/ML IV SOLN
Freq: Once | INTRAVENOUS | Status: AC
  Administered 2016-01-13: 11:00:00 via INTRAVENOUS
  Filled 2016-01-13: qty 1000

## 2016-01-13 MED ORDER — LIDOCAINE-PRILOCAINE 2.5-2.5 % EX CREA: 1.0000 "application " | TOPICAL_CREAM | CUTANEOUS | Status: DC | PRN

## 2016-01-13 MED ORDER — SODIUM CHLORIDE 0.9 % IV SOLN
Freq: Once | INTRAVENOUS | Status: AC
  Administered 2016-01-13: 11:00:00 via INTRAVENOUS
  Filled 2016-01-13: qty 1000

## 2016-01-13 MED ORDER — SODIUM CHLORIDE 0.9 % IV SOLN
20.0000 mg/m2 | Freq: Once | INTRAVENOUS | Status: AC
  Administered 2016-01-13: 31 mg via INTRAVENOUS
  Filled 2016-01-13: qty 31

## 2016-01-13 MED ORDER — SODIUM CHLORIDE 0.9 % IV SOLN
Freq: Once | INTRAVENOUS | Status: AC
  Administered 2016-01-13: 13:00:00 via INTRAVENOUS
  Filled 2016-01-13: qty 5

## 2016-01-13 MED ORDER — JEVITY 1.5 CAL PO LIQD: ORAL | Status: DC

## 2016-01-13 MED ORDER — ONDANSETRON HCL 4 MG PO TABS: 4.0000 mg | ORAL_TABLET | Freq: Four times a day (QID) | ORAL | Status: DC | PRN

## 2016-01-13 MED ORDER — SODIUM CHLORIDE 0.9% FLUSH
10.0000 mL | INTRAVENOUS | Status: DC | PRN
  Administered 2016-01-13: 10 mL
  Filled 2016-01-13: qty 10

## 2016-01-13 MED ORDER — PALONOSETRON HCL INJECTION 0.25 MG/5ML
0.2500 mg | Freq: Once | INTRAVENOUS | Status: AC
  Administered 2016-01-13: 0.25 mg via INTRAVENOUS
  Filled 2016-01-13: qty 5

## 2016-01-13 NOTE — Progress Notes (Signed)
[place] @ [facility] Telephone:(336) [phone]  Fax:([phone]) [place]: December 29, 1932  MR#: [id number]  UTM#:[id number]  Patient Care Team: [name], MD as PCP - General (Family Medicine) [name], MD (General Surgery) [name], MD (Unknown Physician Specialty) [name], MD (Family Medicine) [name], MD (General Surgery)  CHIEF COMPLAINT:  Chief Complaint  Patient presents with  . hypopharynx Cancer  . Rectal Cancer    Oncology History   rectal cancer T3, N1, M0 tumor on chemoradiation therapy Hospitalizations secondary to diarrhea(April, 2014) chemoradiation intrupted. C. difficile diarrhea. 2.Status post abdominal perineal resection pT3  pNO stage II  disease.  (As September, 2014) patient underwent surgery at [facility] 3.  Patient could not tolerate post operative chemotherapy FOLFOX     Rectal cancer ([place])   01/20/2013 Initial Diagnosis Rectal cancer   4.  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 Starting radiation therapy and chemotherapy from January 13, 2016.  Cis-platinum with radiation treatment. Patient  is somewhat depressed INTERVAL HISTORY: 82 year old lady who has been seen by me previously with a history of rectal cancer.  He sent had been a chronic smoker and claims that has recently stopped smoking.  Since last November patient has noticed a mass in the right side of the neck.  Was treated with antibiotics without much improvement patient also has noticed hoarseness of voice.  His last month or so patient has progressive weight loss (approximately 12 pounds since last evaluation) Increasing pain in the right ear.  Pain for swallowing.  Patient was started on  liquid hydrocodone and was evaluated by ENT surgeon.  On evaluation patient was found to have endophytic lesion of the right upper pyriform sinus involving posterior and lateral hypopharynx with extension to the medial hypopharynx and larynx with a right vocal cord which appeared to be immobile Patient was referred to me at this point in time after PET scan for further evaluation and treatment consideration Patient is being evaluated by surgeon for PEG tube placement and port placement. Family had number of questions.  Radiation therapy to be started from March 13.  Planning being done.  Patient continues severe sore throat and inability to drink or eat   January 13, 2016 Patient had a PEG tube placed in for feeding and also had a port placement Starting radiation therapy here for consideration off initiating to feeding  Controlling pain in the right year and throat Starting chemotherapy REVIEW OF SYSTEMS:   Gen. status: Patient is gradually declining and losing weight as described about has lost 12 pounds of weight HEENT: As described about has hoarseness of voice.  Palpable lymph node on the right side of the neck.  Difficulty in swallowing.  An ENT evaluation is described about.  Patient recently had hemoptysis and was in the emergency room Lungs: No cough no shortness of breath patient has stopped smoking very recently Cardiac: No chest pain GI: Increasing difficulty in swallowing.  Patient had a recent colonoscopy done by Dr. Charlean Sanfilippo and no evidence of malignancy was found Skin: No rash Lower extremity: No swelling Neurological system: No headache no dizziness Family is reported progressive dementia All other systems have been reviewed patient has been accompanied with  her son and daughter-in-law PAST MEDICAL HISTORY: Past Medical History  Diagnosis Date  . Status post chemotherapy     colon cancer  . Status post radiation therapy     colon cancer 2013  . Hypertension   .  Diverticulosis   . Hemorrhoids   . Osteopenia   . History of chicken pox   . Arthritis     fingers  . Cancer ([place])     rectal  . Cancer ([place]) 2012    colon cancer  . Cancer of contiguous sites of hypopharynx ([place]) 12/23/2015  . Difficult intubation   . Colon cancer ([place])     PAST SURGICAL HISTORY: Past Surgical History  Procedure Laterality Date  . Appendectomy    . Abdominal hysterectomy    . Tonsillectomy    . Eus N/A 01/20/2013    Procedure: LOWER ENDOSCOPIC ULTRASOUND (EUS);  Surgeon: Milus Banister, MD;  Location: [facility];  Service: Endoscopy;  Laterality: N/A;  . Colonoscopy    . Colon surgery  06/28/2013    resected tumor from colon; [facility]  . Appendectomy  [id number]    Dr. Pat Patrick  . Abdominal hysterectomy  1980    partial  . Dilation and curettage of uterus  1957  . Colonoscopy N/A 10/01/2015    Procedure: COLONOSCOPY;  Surgeon: [name], MD;  Location: [facility] ENDOSCOPY;  Service: Endoscopy;  Laterality: N/A;  . Direct laryngoscopy N/A 12/11/2015    Procedure: DIRECT LARYNGOSCOPY;  Surgeon: Clyde Canterbury, MD;  Location: [facility] ORS;  Service: ENT;  Laterality: N/A;  . Esophagoscopy N/A 12/11/2015    Procedure: ESOPHAGOSCOPY;  Surgeon: Clyde Canterbury, MD;  Location: [facility] ORS;  Service: ENT;  Laterality: N/A;  . Breast surgery      cyst removal  . Breast cyst aspiration Bilateral     negative  . Breast surgery  1960    Breast Biopsy  . Portacath placement Right 01/01/2016    Procedure: INSERTION PORT-A-CATH;  Surgeon: [name], MD;  Location: [facility] ORS;  Service: General;  Laterality: Right;  . Peg placement N/A 01/01/2016    Procedure: PERCUTANEOUS ENDOSCOPIC GASTROSTOMY (PEG) PLACEMENT;  Surgeon: [name], MD;  Location: [facility] ORS;  Service: General;  Laterality: N/A;    FAMILY HISTORY Family History  Problem Relation Age of Onset  . Breast cancer Maternal Grandmother   . Kidney failure Father     ADVANCED DIRECTIVES:    Patient does have advance healthcare directive, Patient   does not desire to make any changes  HEALTH MAINTENANCE: Social History  Substance Use Topics  . Smoking status: Current Every Day Smoker -- 1.00 packs/day for 64 years    Types: Cigarettes  . Smokeless tobacco: Never Used  . Alcohol Use: 0.0 oz/week    0 Standard drinks or equivalent per week     Comment: rarely      Allergies  Allergen Reactions  . Amlodipine Besylate Other (See Comments)    Patient unaware of any allergy with this medicine  . Oxycodone     confusion    Current Outpatient Prescriptions  Medication Sig Dispense Refill  . HYDROcodone-acetaminophen (HYCET) 7.5-325 mg/15 ml solution 10-15 cc every 4-6 hours as needed for pain 300 mL 0  . Melatonin 2.5 MG CAPS Take 1 capsule by mouth at bedtime as needed.     . metoprolol succinate (TOPROL-XL) 25 MG 24 hr tablet TAKE ONE-HALF TABLET BY MOUTH ONCE DAILY* NEEDS OFFICE VISIT* 90 tablet  3  . Multiple Vitamins-Minerals (MULTIVITAMIN ADULT PO) Take 1 tablet by mouth daily. Reported on 12/30/2015    . Nutritional Supplements (ENSURE PLUS PO) Take by mouth.    . nystatin (MYCOSTATIN) 100000 UNIT/ML suspension Reported on 12/30/2015  0   No current facility-administered medications for this visit.   Facility-Administered Medications Ordered in Other Visits  Medication Dose Route Frequency Provider Last Rate Last Dose  . heparin lock flush 100 unit/mL  500 Units Intracatheter Once PRN [name], MD      . sodium chloride flush (NS) 0.9 % injection 10 mL  10 mL Intracatheter PRN [name], MD   10 mL at 01/13/16 0914    OBJECTIVE:  Filed Vitals:   01/13/16 0929  BP: 148/72  Pulse: 87  Temp: 98.4 F (36.9 C)     Body mass index is 19 kg/(m^2).    ECOG FS:1 - Symptomatic but completely ambulatory  PHYSICAL EXAM: Gneral  status: Patient has declining performance status Patient  somewhat depressed. HEENT: No evidence of stomatitis. Sclera and  conjunctivae :: No jaundice.   pale looking. Lungs: Air  entry equal on both sides.  No rhonchi.  No rales.  Cardiac: Heart sounds are normal.  No pericardial rub.  No murmur. Lymphatic system: palpable  cervical lymph node approximately 2 cm.  No other lymphadenopathy GI: Abdomen is soft.  No ascites.  Liver spleen not palpable.  No tenderness.  Bowel sounds are within normal limit Lower extremity: No edema Neurological system: Higher functions, cranial nerves intact no evidence of peripheral neuropathy. Skin: No rash.  No ecchymosis.Marland Kitchen  Extensive tube site is clear.  Port site is within normal limit. LAB RESULTS:  Infusion on 01/13/2016  Component Date Value Ref Range Status  . WBC 01/13/2016 8.7  3.6 - 11.0 K/uL Final  . RBC 01/13/2016 3.38* 3.80 - 5.20 MIL/uL Final  . Hemoglobin 01/13/2016 10.6* 12.0 - 16.0 g/dL Final  . HCT 01/13/2016 31.4* 35.0 - 47.0 % Final  . MCV 01/13/2016 92.9  80.0 - 100.0 fL Final  . MCH 01/13/2016 31.5  26.0 - 34.0 pg Final  . MCHC 01/13/2016 33.9  32.0 - 36.0 g/dL Final  . RDW 01/13/2016 14.2  11.5 - 14.5 % Final  . Platelets 01/13/2016 224  150 - 440 K/uL Final  . Neutrophils Relative % 01/13/2016 76   Final  . Neutro Abs 01/13/2016 6.7* 1.4 - 6.5 K/uL Final  . Lymphocytes Relative 01/13/2016 16   Final  . Lymphs Abs 01/13/2016 1.4  1.0 - 3.6 K/uL Final  . Monocytes Relative 01/13/2016 6   Final  . Monocytes Absolute 01/13/2016 0.5  0.2 - 0.9 K/uL Final  . Eosinophils Relative 01/13/2016 1   Final  . Eosinophils Absolute 01/13/2016 0.0  0 - 0.7 K/uL Final  . Basophils Relative 01/13/2016 1   Final  . Basophils Absolute 01/13/2016 0.0  0 - 0.1 K/uL Final  . Sodium 01/13/2016 137  135 - 145 mmol/L Final  . Potassium 01/13/2016 3.2* 3.5 - 5.1 mmol/L Final  . Chloride 01/13/2016 103  101 - 111 mmol/L Final  . CO2 01/13/2016 27  22 - 32 mmol/L Final  . Glucose, Bld 01/13/2016 192* 65 - 99 mg/dL Final  . BUN 01/13/2016 21* 6 - 20 mg/dL Final  .  Creatinine, Ser 01/13/2016 1.11* 0.44 - 1.00 mg/dL Final  . Calcium 01/13/2016 9.6  8.9 - 10.3 mg/dL Final  . Total Protein 01/13/2016 7.0  6.5 - 8.1 g/dL  Final  . Albumin 01/13/2016 3.4* 3.5 - 5.0 g/dL Final  . AST 01/13/2016 25  15 - 41 U/L Final  . ALT 01/13/2016 23  14 - 54 U/L Final  . Alkaline Phosphatase 01/13/2016 68  38 - 126 U/L Final  . Total Bilirubin 01/13/2016 0.5  0.3 - 1.2 mg/dL Final  . GFR calc non Af Amer 01/13/2016 46* >60 mL/min Final  . GFR calc Af Amer 01/13/2016 53* >60 mL/min Final   Comment: (NOTE) The eGFR has been calculated using the CKD EPI equation. This calculation has not been validated in all clinical situations. eGFR's persistently <60 mL/min signify possible Chronic Kidney Disease.   . Anion gap 01/13/2016 7  5 - 15 Final  . Magnesium 01/13/2016 2.0  1.7 - 2.4 mg/dL Final      STUDIES: Dg Chest 2 View  12/21/2015  CLINICAL DATA:  Sore throat and hemoptysis. Recent laryngoscopy 12/11/2015 EXAM: CHEST  2 VIEW COMPARISON:  PET-CT 2 days prior 12/19/2015 FINDINGS: The cardiomediastinal contours are normal. Aortic atherosclerosis noted. Small nodular opacity in the right upper lung, not hypermetabolic on recent PET. Pulmonary vasculature is normal. No consolidation, pleural effusion, or pneumothorax. No acute osseous abnormalities are seen. Remote left rib fracture. IMPRESSION: No acute pulmonary process. Electronically Signed   By: Jeb Levering M.D.   On: 12/21/2015 16:21   Dg Esophagus  12/30/2015  CLINICAL DATA:  Difficulty swallowing. Weight loss. Head neck cancer. EXAM: ESOPHOGRAM / BARIUM SWALLOW / BARIUM TABLET STUDY TECHNIQUE: Combined double contrast and single contrast examination performed using effervescent crystals, thick barium liquid, and thin barium liquid. The patient was observed with fluoroscopy swallowing a 13 mm barium sulphate tablet. FLUOROSCOPY TIME:  Radiation Exposure Index (as provided by the fluoroscopic device): 18.2 mGy  COMPARISON:  PET-CT 12/19/2015. FINDINGS: Deformity of the right pharynx noted consistent patient's known diagnosis of malignancy. No obstructing lesions identified. Mild barium aspiration was present. Thoracic esophagus is normal. No obstructing lesion. Normal peristalsis. No hiatal hernia. No reflux. IMPRESSION: 1. Deformity of the right hypopharynx noted consistent with patient's known diagnosis of head neck malignancy. No obstructing lesion noted. Mild barium aspiration into the trachea. 2. Exam otherwise unremarkable . Thoracic esophagus is normal. No reflux. Electronically Signed   By: Marcello Moores  Register   On: 12/30/2015 13:26   Nm Pet Image Initial (pi) Skull Base To Thigh  12/19/2015  CLINICAL DATA:  Subsequent treatment strategy for malignant neoplasm of the nasal cavity. EXAM: NUCLEAR MEDICINE PET SKULL BASE TO THIGH TECHNIQUE: 12.47 mCi F-18 FDG was injected intravenously. Full-ring PET imaging was performed from the skull base to thigh after the radiotracer. CT data was obtained and used for attenuation correction and anatomic localization. FASTING BLOOD GLUCOSE:  Value: 105 mg/dl COMPARISON:  01/09/2013 FINDINGS: NECK Hypermetabolic lesion is identified at the in a hole a corresponding with the recently biopsied right lateral pharyngeal wall lesion extending into the piriform sinus. The SUV max associated with this lesion is equal to 16.7. Enlarged and hypermetabolic right level-II node measures 1.7 cm and has an SUV max equal to 11.57. Calcified atherosclerotic change involves the carotid arteries. CHEST No hypermetabolic mediastinal or hilar nodes. Aortic atherosclerosis noted. Calcification involving the RCA, LAD coronary arteries noted. No hypermetabolic mediastinal or hilar nodes identified. No suspicious pulmonary nodules on the CT scan. ABDOMEN/PELVIS No abnormal hypermetabolic activity within the liver, pancreas, adrenal glands, or spleen. 1 cm gallstone identified. No hypermetabolic lymph  nodes in the abdomen or pelvis. SKELETON No focal  hypermetabolic activity to suggest skeletal metastasis. IMPRESSION: 1. The right lateral pharyngeal wall lesion is intensely hypermetabolic compatible with primary head/neck carcinoma. 2. Hypermetabolic ipsilateral level 2 cervical lymph node. 3. Aortic atherosclerosis as well as coronary artery calcification 4. Gallstone. Electronically Signed   By: Kerby Moors M.D.   On: 12/19/2015 14:47   Dg Chest Port 1 View  01/01/2016  CLINICAL DATA:  MediPort placement EXAM: PORTABLE CHEST 1 VIEW COMPARISON:  12/21/2015 chest radiograph. FINDINGS: Right subclavian MediPort terminates at the cavoatrial junction. Stable cardiomediastinal silhouette with top-normal heart size. No pneumothorax. No pleural effusion. Mild left basilar atelectasis. No pulmonary edema. No acute consolidative airspace disease. There is free intraperitoneal air under the right hemidiaphragm. IMPRESSION: 1. Well-positioned right subclavian MediPort.  No pneumothorax. 2. Mild left basilar atelectasis. 3. Free intraperitoneal air under the right hemidiaphragm. Per discussion with Dr. Bary Castilla, the patient also had a percutaneous gastrostomy tube placed, accounting for the free air. Any imaging follow-up should be performed as clinically warranted. These results were called by telephone at the time of interpretation on 01/01/2016 at 5:33 pm to Dr. Hervey Ard , who verbally acknowledged these results. Electronically Signed   By: Ilona Sorrel M.D.   On: 01/01/2016 17:39   Dg C-arm 1-60 Min-no Report  01/01/2016  CLINICAL DATA: port a cath insertion C-ARM 1-60 MINUTES Fluoroscopy was utilized by the requesting physician.  No radiographic interpretation.    ASSESSMENT: 1.  Carcinoma of hypopharynx extending to multiple sites with vocal cord being immobile (pyriform sinus tumor extending to the vocal cord and lateral pharyngeal wall) T3 N1 M0 tumor stage III Complete extent of the disease not known  because of laryngoscopy examination was admitted because of frequent gag reflex.  2.  Regarding pain control we have started patient on Vicodin liquid.  Instruction regarding constipation was given. 3.  Will initiate chemotherapy today. 4.  We will also start patient on Jevity feeding half can 4 times a day will consult dietitian I would discuss with her regarding supplementing PEG tube feeding with a little bit of oral diet the patient is taking at present time Duration of visit is 25 minutes and 50% of time was spent discussing VARIOUS  options or synptomatic care  care with other physicians social worker, dietitian as well as management of pain and feeding and chemotherapy side effect  Patient was instructed to call us if there is any nausea interim IV fluid can be given Patient will be reevaluated next week by my associate  Patient expressed understanding and was in agreement with this plan. She also understands that She can call clinic at any time with any questions, concerns, or complaints.    No matching staging information was found for the patient.  [name], MD   01/13/2016 10:00 AM

## 2016-01-13 NOTE — Progress Notes (Signed)
Patient to start tube feeding,PEG tube placed and patient is waiting for Rx for jevity and needs piston syringe

## 2016-01-14 ENCOUNTER — Ambulatory Visit: Payer: Medicare Other

## 2016-01-14 ENCOUNTER — Ambulatory Visit
Admission: RE | Admit: 2016-01-14 | Discharge: 2016-01-14 | Disposition: A | Discharge status: Home / Self Care | Payer: Medicare Other | Source: Ambulatory Visit | Attending: Radiation Oncology | Admitting: Radiation Oncology

## 2016-01-14 DIAGNOSIS — M1990 Unspecified osteoarthritis, unspecified site: Secondary | ICD-10-CM | POA: Diagnosis not present

## 2016-01-14 DIAGNOSIS — I10 Essential (primary) hypertension: Secondary | ICD-10-CM | POA: Diagnosis not present

## 2016-01-14 DIAGNOSIS — Z510 Encounter for antineoplastic radiation therapy: Secondary | ICD-10-CM | POA: Diagnosis not present

## 2016-01-14 DIAGNOSIS — Z431 Encounter for attention to gastrostomy: Secondary | ICD-10-CM | POA: Diagnosis not present

## 2016-01-14 DIAGNOSIS — R634 Abnormal weight loss: Secondary | ICD-10-CM | POA: Diagnosis not present

## 2016-01-14 DIAGNOSIS — Z85048 Personal history of other malignant neoplasm of rectum, rectosigmoid junction, and anus: Secondary | ICD-10-CM | POA: Diagnosis not present

## 2016-01-14 DIAGNOSIS — C139 Malignant neoplasm of hypopharynx, unspecified: Secondary | ICD-10-CM | POA: Diagnosis not present

## 2016-01-15 ENCOUNTER — Ambulatory Visit
Admission: RE | Admit: 2016-01-15 | Discharge: 2016-01-15 | Disposition: A | Discharge status: Home / Self Care | Payer: Medicare Other | Source: Ambulatory Visit | Attending: Radiation Oncology | Admitting: Radiation Oncology

## 2016-01-15 ENCOUNTER — Ambulatory Visit: Payer: Medicare Other

## 2016-01-15 DIAGNOSIS — Z510 Encounter for antineoplastic radiation therapy: Secondary | ICD-10-CM | POA: Diagnosis not present

## 2016-01-15 DIAGNOSIS — C139 Malignant neoplasm of hypopharynx, unspecified: Secondary | ICD-10-CM | POA: Diagnosis not present

## 2016-01-16 ENCOUNTER — Other Ambulatory Visit: Payer: Self-pay | Admitting: *Deleted

## 2016-01-16 ENCOUNTER — Ambulatory Visit: Payer: Medicare Other

## 2016-01-16 ENCOUNTER — Ambulatory Visit: Payer: Self-pay

## 2016-01-16 ENCOUNTER — Telehealth: Payer: Self-pay | Admitting: *Deleted

## 2016-01-16 ENCOUNTER — Ambulatory Visit
Admission: RE | Admit: 2016-01-16 | Discharge: 2016-01-16 | Disposition: A | Discharge status: Home / Self Care | Payer: Medicare Other | Source: Ambulatory Visit | Attending: Radiation Oncology | Admitting: Radiation Oncology

## 2016-01-16 DIAGNOSIS — M1990 Unspecified osteoarthritis, unspecified site: Secondary | ICD-10-CM | POA: Diagnosis not present

## 2016-01-16 DIAGNOSIS — I10 Essential (primary) hypertension: Secondary | ICD-10-CM | POA: Diagnosis not present

## 2016-01-16 DIAGNOSIS — Z510 Encounter for antineoplastic radiation therapy: Secondary | ICD-10-CM | POA: Diagnosis not present

## 2016-01-16 DIAGNOSIS — C139 Malignant neoplasm of hypopharynx, unspecified: Secondary | ICD-10-CM | POA: Diagnosis not present

## 2016-01-16 DIAGNOSIS — R634 Abnormal weight loss: Secondary | ICD-10-CM | POA: Diagnosis not present

## 2016-01-16 DIAGNOSIS — Z431 Encounter for attention to gastrostomy: Secondary | ICD-10-CM | POA: Diagnosis not present

## 2016-01-16 DIAGNOSIS — Z85048 Personal history of other malignant neoplasm of rectum, rectosigmoid junction, and anus: Secondary | ICD-10-CM | POA: Diagnosis not present

## 2016-01-16 NOTE — Telephone Encounter (Signed)
Per pt's son, pt woke up nauseated when was getting ready for radiation appt. Son states did not have enough time to give pt nausea medication prior to leaving. Informed son that can add patient on for IV medication but son stated that will try and give zofran by mouth at home. Son stated will call back if nausea persists.

## 2016-01-16 NOTE — Telephone Encounter (Signed)
Spoke with Emily [name] regarding appt for dietitian. Otila Kluver will forward pt information to [facility] to review for appt with dietitian regarding tube feeding.

## 2016-01-17 ENCOUNTER — Ambulatory Visit: Payer: Medicare Other

## 2016-01-17 ENCOUNTER — Ambulatory Visit
Admission: RE | Admit: 2016-01-17 | Discharge: 2016-01-17 | Disposition: A | Discharge status: Home / Self Care | Payer: Medicare Other | Source: Ambulatory Visit | Attending: Radiation Oncology | Admitting: Radiation Oncology

## 2016-01-17 DIAGNOSIS — Z510 Encounter for antineoplastic radiation therapy: Secondary | ICD-10-CM | POA: Diagnosis not present

## 2016-01-17 DIAGNOSIS — C139 Malignant neoplasm of hypopharynx, unspecified: Secondary | ICD-10-CM | POA: Diagnosis not present

## 2016-01-20 ENCOUNTER — Inpatient Hospital Stay (HOSPITAL_BASED_OUTPATIENT_CLINIC_OR_DEPARTMENT_OTHER): Payer: Medicare Other | Admitting: Internal Medicine

## 2016-01-20 ENCOUNTER — Inpatient Hospital Stay: Payer: Medicare Other

## 2016-01-20 ENCOUNTER — Ambulatory Visit: Payer: Medicare Other

## 2016-01-20 ENCOUNTER — Encounter: Payer: Self-pay | Admitting: Internal Medicine

## 2016-01-20 ENCOUNTER — Ambulatory Visit
Admission: RE | Admit: 2016-01-20 | Discharge: 2016-01-20 | Disposition: A | Discharge status: Home / Self Care | Payer: Medicare Other | Source: Ambulatory Visit | Attending: Radiation Oncology | Admitting: Radiation Oncology

## 2016-01-20 ENCOUNTER — Telehealth: Payer: Self-pay | Admitting: Oncology

## 2016-01-20 VITALS — BP 139/65 | HR 74 | Temp 96.3°F | Resp 18 | Ht 65.0 in | Wt 114.9 lb

## 2016-01-20 DIAGNOSIS — C4442 Squamous cell carcinoma of skin of scalp and neck: Secondary | ICD-10-CM

## 2016-01-20 DIAGNOSIS — Z87891 Personal history of nicotine dependence: Secondary | ICD-10-CM

## 2016-01-20 DIAGNOSIS — Z5111 Encounter for antineoplastic chemotherapy: Secondary | ICD-10-CM | POA: Diagnosis not present

## 2016-01-20 DIAGNOSIS — C801 Malignant (primary) neoplasm, unspecified: Secondary | ICD-10-CM

## 2016-01-20 DIAGNOSIS — Z85048 Personal history of other malignant neoplasm of rectum, rectosigmoid junction, and anus: Secondary | ICD-10-CM

## 2016-01-20 DIAGNOSIS — Z8719 Personal history of other diseases of the digestive system: Secondary | ICD-10-CM

## 2016-01-20 DIAGNOSIS — R197 Diarrhea, unspecified: Secondary | ICD-10-CM | POA: Diagnosis not present

## 2016-01-20 DIAGNOSIS — Z923 Personal history of irradiation: Secondary | ICD-10-CM

## 2016-01-20 DIAGNOSIS — E876 Hypokalemia: Secondary | ICD-10-CM

## 2016-01-20 DIAGNOSIS — I10 Essential (primary) hypertension: Secondary | ICD-10-CM

## 2016-01-20 DIAGNOSIS — J029 Acute pharyngitis, unspecified: Secondary | ICD-10-CM | POA: Diagnosis not present

## 2016-01-20 DIAGNOSIS — C12 Malignant neoplasm of pyriform sinus: Secondary | ICD-10-CM | POA: Diagnosis not present

## 2016-01-20 DIAGNOSIS — K8020 Calculus of gallbladder without cholecystitis without obstruction: Secondary | ICD-10-CM

## 2016-01-20 DIAGNOSIS — M129 Arthropathy, unspecified: Secondary | ICD-10-CM

## 2016-01-20 DIAGNOSIS — H9201 Otalgia, right ear: Secondary | ICD-10-CM | POA: Diagnosis not present

## 2016-01-20 DIAGNOSIS — R634 Abnormal weight loss: Secondary | ICD-10-CM

## 2016-01-20 DIAGNOSIS — C139 Malignant neoplasm of hypopharynx, unspecified: Secondary | ICD-10-CM

## 2016-01-20 DIAGNOSIS — R042 Hemoptysis: Secondary | ICD-10-CM

## 2016-01-20 DIAGNOSIS — Z510 Encounter for antineoplastic radiation therapy: Secondary | ICD-10-CM | POA: Diagnosis not present

## 2016-01-20 DIAGNOSIS — R1310 Dysphagia, unspecified: Secondary | ICD-10-CM

## 2016-01-20 LAB — CBC WITH DIFFERENTIAL/PLATELET
BASOS PCT: 0 %
Basophils Absolute: 0 10*3/uL (ref 0–0.1)
EOS ABS: 0.2 10*3/uL (ref 0–0.7)
EOS PCT: 2 %
HCT: 29.6 % — ABNORMAL LOW (ref 35.0–47.0)
HEMOGLOBIN: 10.1 g/dL — AB (ref 12.0–16.0)
Lymphocytes Relative: 10 %
Lymphs Abs: 0.9 10*3/uL — ABNORMAL LOW (ref 1.0–3.6)
MCH: 31.6 pg (ref 26.0–34.0)
MCHC: 34.1 g/dL (ref 32.0–36.0)
MCV: 92.7 fL (ref 80.0–100.0)
Monocytes Absolute: 0.6 10*3/uL (ref 0.2–0.9)
Monocytes Relative: 6 %
NEUTROS PCT: 82 %
Neutro Abs: 7.5 10*3/uL — ABNORMAL HIGH (ref 1.4–6.5)
PLATELETS: 302 10*3/uL (ref 150–440)
RBC: 3.2 MIL/uL — AB (ref 3.80–5.20)
RDW: 14.3 % (ref 11.5–14.5)
WBC: 9.2 10*3/uL (ref 3.6–11.0)

## 2016-01-20 LAB — COMPREHENSIVE METABOLIC PANEL
ALBUMIN: 3.1 g/dL — AB (ref 3.5–5.0)
ALK PHOS: 58 U/L (ref 38–126)
ALT: 24 U/L (ref 14–54)
ANION GAP: 5 (ref 5–15)
AST: 25 U/L (ref 15–41)
BUN: 21 mg/dL — ABNORMAL HIGH (ref 6–20)
CALCIUM: 8.5 mg/dL — AB (ref 8.9–10.3)
CHLORIDE: 101 mmol/L (ref 101–111)
CO2: 27 mmol/L (ref 22–32)
Creatinine, Ser: 1.14 mg/dL — ABNORMAL HIGH (ref 0.44–1.00)
GFR calc Af Amer: 51 mL/min — ABNORMAL LOW (ref >60)
GFR calc non Af Amer: 44 mL/min — ABNORMAL LOW (ref >60)
GLUCOSE: 155 mg/dL — AB (ref 65–99)
Potassium: 3.1 mmol/L — ABNORMAL LOW (ref 3.5–5.1)
SODIUM: 133 mmol/L — AB (ref 135–145)
Total Bilirubin: 0.3 mg/dL (ref 0.3–1.2)
Total Protein: 6.2 g/dL — ABNORMAL LOW (ref 6.5–8.1)

## 2016-01-20 LAB — MAGNESIUM: Magnesium: 1.9 mg/dL (ref 1.7–2.4)

## 2016-01-20 MED ORDER — SODIUM CHLORIDE 0.9 % IV SOLN
INTRAVENOUS | Status: DC
  Administered 2016-01-20: 11:00:00 via INTRAVENOUS
  Filled 2016-01-20: qty 500

## 2016-01-20 MED ORDER — POTASSIUM CHLORIDE 20 MEQ/15ML (10%) PO SOLN: 20.0000 meq | Freq: Every day | ORAL | Status: DC

## 2016-01-20 MED ORDER — HYDROCODONE-ACETAMINOPHEN 7.5-325 MG/15ML PO SOLN: ORAL | Status: DC

## 2016-01-20 MED ORDER — PREDNISONE 20 MG PO TABS: 20.0000 mg | ORAL_TABLET | Freq: Two times a day (BID) | ORAL | Status: DC

## 2016-01-20 MED ORDER — SODIUM CHLORIDE 0.9% FLUSH
10.0000 mL | INTRAVENOUS | Status: DC | PRN
  Administered 2016-01-20: 10 mL via INTRAVENOUS
  Filled 2016-01-20: qty 10

## 2016-01-20 MED ORDER — HEPARIN SOD (PORK) LOCK FLUSH 100 UNIT/ML IV SOLN
500.0000 [IU] | Freq: Once | INTRAVENOUS | Status: AC
  Administered 2016-01-20: 500 [IU] via INTRAVENOUS
  Filled 2016-01-20: qty 5

## 2016-01-20 NOTE — Telephone Encounter (Signed)
Emily [name] left a voicemail that Emily [name] is not doing well after chemo on 3/13. Please call: [phone].

## 2016-01-20 NOTE — Progress Notes (Signed)
Pt's family states twice this weekend she felt and faint like ane weak, had night sweats.  Also they noted diarrhea with jevity feeding.  They only give tube feeding if she is not eating and drinking good. She is not having swallowing issues this am. Pt states her g tube is annoying discomfort.  i cleaned around site dried up blood, cleaned it and applied new dressing around it.

## 2016-01-20 NOTE — Progress Notes (Signed)
[place] OFFICE PROGRESS NOTE  [name] Care Team: [name], MD as PCP - General (Family Medicine) [name], MD (General Surgery) [name], MD (Unknown Physician Specialty) [name], MD (Family Medicine) [name], MD (General Surgery)   SUMMARY OF ONCOLOGIC HISTORY:  # FEB 2017-  Squamous cell carcinoma hypopharynx- T3 N1- currently on concurrent chemoradiation with weekly cisplatin.  #  April 2014- Rectal cancer T3N1 s/p Neo-adj Chemo-RT s/p APR [[facility]]; pT3pN0- FOLFOX not tolerated.     INTERVAL HISTORY:  82 year old female [name] with above history of squamous cell carcinoma hypopharynx stage TIII N1- currently on concurrent chemoradiation is for follow-up. [name] had cycle #1 of cisplatin/along with radiation approximately week ago.  [name] had poor by mouth intake; with nausea. She feels weak. She had been having loose stools/which the family attributes to [place]. She had loose stools in the clinic. No fever no chills. She continues to have pain while swallowing. No chest pain or shortness of breath or cough. She has not been drinking or eating enough because of nausea/poor appetite.  REVIEW OF SYSTEMS:  A complete 10 point review of system is done which is negative except mentioned above/history of present illness.   PAST MEDICAL HISTORY :  Past Medical History  Diagnosis Date  . Status post chemotherapy     colon cancer  . Status post radiation therapy     colon cancer 2013  . Hypertension   . Diverticulosis   . Hemorrhoids   . Osteopenia   . History of chicken pox   . Arthritis     fingers  . Cancer ([place])     rectal  . Cancer ([place]) 2012    colon cancer  . Cancer of contiguous sites of hypopharynx ([place]) 12/23/2015  . Difficult intubation   . Colon cancer ([place])     PAST SURGICAL HISTORY :   Past Surgical History  Procedure Laterality Date  . Appendectomy    . Abdominal hysterectomy    . Tonsillectomy     . Eus N/A 01/20/2013    Procedure: LOWER ENDOSCOPIC ULTRASOUND (EUS);  Surgeon: Milus Banister, MD;  Location: [facility];  Service: Endoscopy;  Laterality: N/A;  . Colonoscopy    . Colon surgery  06/28/2013    [facility]  . Appendectomy  [id number]    Dr. Pat Patrick  . Abdominal hysterectomy  1980    partial  . Dilation and curettage of uterus  1957  . Colonoscopy N/A 10/01/2015    Procedure: COLONOSCOPY;  Surgeon: [name], MD;  Location: [facility] ENDOSCOPY;  Service: Endoscopy;  Laterality: N/A;  . Direct laryngoscopy N/A 12/11/2015    Procedure: DIRECT LARYNGOSCOPY;  Surgeon: Clyde Canterbury, MD;  Location: [facility] ORS;  Service: ENT;  Laterality: N/A;  . Esophagoscopy N/A 12/11/2015    Procedure: ESOPHAGOSCOPY;  Surgeon: Clyde Canterbury, MD;  Location: [facility] ORS;  Service: ENT;  Laterality: N/A;  . Breast surgery      cyst removal  . Breast cyst aspiration Bilateral     negative  . Breast surgery  1960    Breast Biopsy  . Portacath placement Right 01/01/2016    Procedure: INSERTION PORT-A-CATH;  Surgeon: [name], MD;  Location: [facility] ORS;  Service: General;  Laterality: Right;  . Peg placement N/A 01/01/2016    Procedure: PERCUTANEOUS ENDOSCOPIC GASTROSTOMY (PEG) PLACEMENT;  Surgeon: [name], MD;  Location: [facility] ORS;  Service: General;  Laterality: N/A;    FAMILY HISTORY :   Family History  Problem Relation Age of Onset  . Breast cancer Maternal Grandmother   . Kidney failure Father     SOCIAL HISTORY:   Social History  Substance Use Topics  . Smoking status: Current Every Day Smoker -- 1.00 packs/day for 64 years    Types: Cigarettes  . Smokeless tobacco: Never Used  . Alcohol Use: 0.0 oz/week    0 Standard drinks or equivalent per week     Comment: rarely    ALLERGIES:  is allergic to amlodipine besylate and oxycodone.  MEDICATIONS:  Current Outpatient Prescriptions  Medication Sig Dispense Refill  .  HYDROcodone-acetaminophen (HYCET) 7.5-325 mg/15 ml solution 10-15 cc every 4-6 hours as needed for pain 300 mL 0  . lidocaine-prilocaine (EMLA) cream Apply 1 application topically as needed. 30 g 0  . metoprolol succinate (TOPROL-XL) 25 MG 24 hr tablet TAKE ONE-HALF TABLET BY MOUTH ONCE DAILY* NEEDS OFFICE VISIT* 90 tablet 3  . Nutritional Supplements (ENSURE PLUS PO) Take by mouth.    . Nutritional Supplements (FEEDING SUPPLEMENT, JEVITY 1.5 CAL,) LIQD Take 163ml jevity via PEG tube four times a day. Then flush tube with 22ml water after each feeding. 7500 mL 6  . nystatin (MYCOSTATIN) 100000 UNIT/ML suspension Reported on 12/30/2015  0  . ondansetron (ZOFRAN) 4 MG tablet Take 1 tablet (4 mg total) by mouth every 6 (six) hours as needed. 30 tablet 3   No current facility-administered medications for this visit.    PHYSICAL EXAMINATION: ECOG PERFORMANCE STATUS: 3 - Symptomatic, >50% confined to bed  BP 139/65 mmHg  Pulse 74  Temp(Src) 96.3 F (35.7 C) (Tympanic)  Resp 18  Ht 5\' 5"  (1.651 m)  Wt 114 lb 13.8 oz (52.1 kg)  BMI 19.11 kg/m2  Filed Weights   01/20/16 1017  Weight: 114 lb 13.8 oz (52.1 kg)    GENERAL: Moderately nourished; well-developed.; Alert, no distress and comfortable.   With family. She is in a wheelchair. She feels weak. EYES: no pallor or icterus OROPHARYNX: no thrush or ulceration; good dentition  NECK: supple, no masses felt LYMPH: 2-3 cm cervical was felt on the right side. LUNGS: clear to auscultation and  No wheeze or crackles HEART/CVS: regular rate & rhythm and no murmurs; No lower extremity edema ABDOMEN:abdomen soft, non-tender and normal bowel sounds. PEG tube in place. Musculoskeletal:no cyanosis of digits and no clubbing  PSYCH: alert & oriented x 3 with fluent speech NEURO: no focal motor/sensory deficits SKIN:  no rashes or significant lesions  LABORATORY DATA:  I have reviewed the data as listed    Component Value Date/Time   NA 133*  01/20/2016 0907   NA 144 05/13/2015 1556   NA 143 11/19/2014 1440   K 3.1* 01/20/2016 0907   K 4.1 11/19/2014 1440   CL 101 01/20/2016 0907   CL 108* 11/19/2014 1440   CO2 27 01/20/2016 0907   CO2 30 11/19/2014 1440   GLUCOSE 155* 01/20/2016 0907   GLUCOSE 107* 05/13/2015 1556   GLUCOSE 116* 11/19/2014 1440   BUN 21* 01/20/2016 0907   BUN 24 05/13/2015 1556   BUN 24* 11/19/2014 1440   CREATININE 1.14* 01/20/2016 0907   CREATININE 1.29 11/19/2014 1440   CREATININE 1.7* 12/26/2012   CALCIUM 8.5* 01/20/2016 0907   CALCIUM 9.8 11/19/2014 1440   PROT 6.2* 01/20/2016 0907   PROT 7.3 11/19/2014 1440   ALBUMIN 3.1* 01/20/2016 0907   ALBUMIN 4.3 05/13/2015  1556   ALBUMIN 3.6 11/19/2014 1440   AST 25 01/20/2016 0907   AST 14* 11/19/2014 1440   ALT 24 01/20/2016 0907   ALT 18 11/19/2014 1440   ALKPHOS 58 01/20/2016 0907   ALKPHOS 78 11/19/2014 1440   BILITOT 0.3 01/20/2016 0907   BILITOT 0.3 11/19/2014 1440   GFRNONAA 44* 01/20/2016 0907   GFRNONAA 42* 11/19/2014 1440   GFRNONAA 38* 05/01/2014 1534   GFRAA 51* 01/20/2016 0907   GFRAA 51* 11/19/2014 1440   GFRAA 44* 05/01/2014 1534    No results found for: SPEP, UPEP  Lab Results  Component Value Date   WBC 9.2 01/20/2016   NEUTROABS 7.5* 01/20/2016   HGB 10.1* 01/20/2016   HCT 29.6* 01/20/2016   MCV 92.7 01/20/2016   PLT 302 01/20/2016      Chemistry      Component Value Date/Time   NA 133* 01/20/2016 0907   NA 144 05/13/2015 1556   NA 143 11/19/2014 1440   K 3.1* 01/20/2016 0907   K 4.1 11/19/2014 1440   CL 101 01/20/2016 0907   CL 108* 11/19/2014 1440   CO2 27 01/20/2016 0907   CO2 30 11/19/2014 1440   BUN 21* 01/20/2016 0907   BUN 24 05/13/2015 1556   BUN 24* 11/19/2014 1440   CREATININE 1.14* 01/20/2016 0907   CREATININE 1.29 11/19/2014 1440   CREATININE 1.7* 12/26/2012   GLU 118 12/26/2012      Component Value Date/Time   CALCIUM 8.5* 01/20/2016 0907   CALCIUM 9.8 11/19/2014 1440   ALKPHOS 58  01/20/2016 0907   ALKPHOS 78 11/19/2014 1440   AST 25 01/20/2016 0907   AST 14* 11/19/2014 1440   ALT 24 01/20/2016 0907   ALT 18 11/19/2014 1440   BILITOT 0.3 01/20/2016 0907   BILITOT 0.3 11/19/2014 1440          ASSESSMENT & PLAN:   # Squamous cell carcinoma hypopharynx- T3 N1- currently on concurrent chemoradiation with weekly cisplatin. [name] status post 1 treatment.   # [name] is due for for cycle #2 today. Given the nausea /poor appetite/extreme fatigue/poor by mouth intake recommend holding chemotherapy today. Recommend IV fluids/ Zofran today. Defer to Dr. Oliva Bustard Re: Using alternative chemotherapy like carbotaxol weekly along with radiation. Okay to continue radiation for now.  # Nausea/poor by mouth intake- from cisplatin chemotherapy. Recommend IV fluids today.  # diarrhea- ? From Jevity/tube feeds. Recommend by mouth Imodium as needed.  # Pain control- given a prescription for hydrocodone liquid.  # Nutrition- s/p PEG tube. Recommend adding prednisone 40mg / day x7 days.   # Hypokalemia- IV potassium with fluids today; and again this Friday.   The above plan of care was discussed with the [name] and family in detail. They agree.  # 25 minutes face-to-face with the [name] discussing the above plan of care; more than 50% of time spent on prognosis/ natural history; counseling and coordination.       Cammie Sickle, MD 01/20/2016 10:23 AM

## 2016-01-20 NOTE — Telephone Encounter (Signed)
This issue was addressed at her radiation visit on 3/15.

## 2016-01-21 ENCOUNTER — Ambulatory Visit
Admission: RE | Admit: 2016-01-21 | Discharge: 2016-01-21 | Disposition: A | Discharge status: Home / Self Care | Payer: Medicare Other | Source: Ambulatory Visit | Attending: Radiation Oncology | Admitting: Radiation Oncology

## 2016-01-21 ENCOUNTER — Ambulatory Visit: Payer: Medicare Other

## 2016-01-21 DIAGNOSIS — C12 Malignant neoplasm of pyriform sinus: Secondary | ICD-10-CM | POA: Diagnosis not present

## 2016-01-21 DIAGNOSIS — Z510 Encounter for antineoplastic radiation therapy: Secondary | ICD-10-CM | POA: Diagnosis not present

## 2016-01-21 DIAGNOSIS — C139 Malignant neoplasm of hypopharynx, unspecified: Secondary | ICD-10-CM | POA: Diagnosis not present

## 2016-01-22 ENCOUNTER — Ambulatory Visit
Admission: RE | Admit: 2016-01-22 | Discharge: 2016-01-22 | Disposition: A | Discharge status: Home / Self Care | Payer: Medicare Other | Source: Ambulatory Visit | Attending: Radiation Oncology | Admitting: Radiation Oncology

## 2016-01-22 ENCOUNTER — Ambulatory Visit: Payer: Medicare Other

## 2016-01-22 DIAGNOSIS — R634 Abnormal weight loss: Secondary | ICD-10-CM | POA: Diagnosis not present

## 2016-01-22 DIAGNOSIS — Z85048 Personal history of other malignant neoplasm of rectum, rectosigmoid junction, and anus: Secondary | ICD-10-CM | POA: Diagnosis not present

## 2016-01-22 DIAGNOSIS — C12 Malignant neoplasm of pyriform sinus: Secondary | ICD-10-CM | POA: Diagnosis not present

## 2016-01-22 DIAGNOSIS — Z431 Encounter for attention to gastrostomy: Secondary | ICD-10-CM | POA: Diagnosis not present

## 2016-01-22 DIAGNOSIS — C139 Malignant neoplasm of hypopharynx, unspecified: Secondary | ICD-10-CM | POA: Diagnosis not present

## 2016-01-22 DIAGNOSIS — M1990 Unspecified osteoarthritis, unspecified site: Secondary | ICD-10-CM | POA: Diagnosis not present

## 2016-01-22 DIAGNOSIS — Z510 Encounter for antineoplastic radiation therapy: Secondary | ICD-10-CM | POA: Diagnosis not present

## 2016-01-22 DIAGNOSIS — I10 Essential (primary) hypertension: Secondary | ICD-10-CM | POA: Diagnosis not present

## 2016-01-23 ENCOUNTER — Ambulatory Visit
Admission: RE | Admit: 2016-01-23 | Discharge: 2016-01-23 | Disposition: A | Discharge status: Home / Self Care | Payer: Medicare Other | Source: Ambulatory Visit | Attending: Radiation Oncology | Admitting: Radiation Oncology

## 2016-01-23 ENCOUNTER — Ambulatory Visit: Payer: Medicare Other

## 2016-01-23 DIAGNOSIS — Z510 Encounter for antineoplastic radiation therapy: Secondary | ICD-10-CM | POA: Diagnosis not present

## 2016-01-23 DIAGNOSIS — C139 Malignant neoplasm of hypopharynx, unspecified: Secondary | ICD-10-CM | POA: Diagnosis not present

## 2016-01-23 DIAGNOSIS — C12 Malignant neoplasm of pyriform sinus: Secondary | ICD-10-CM | POA: Diagnosis not present

## 2016-01-23 DIAGNOSIS — Z85048 Personal history of other malignant neoplasm of rectum, rectosigmoid junction, and anus: Secondary | ICD-10-CM | POA: Diagnosis not present

## 2016-01-23 DIAGNOSIS — I10 Essential (primary) hypertension: Secondary | ICD-10-CM | POA: Diagnosis not present

## 2016-01-23 DIAGNOSIS — R634 Abnormal weight loss: Secondary | ICD-10-CM | POA: Diagnosis not present

## 2016-01-23 DIAGNOSIS — Z431 Encounter for attention to gastrostomy: Secondary | ICD-10-CM | POA: Diagnosis not present

## 2016-01-23 DIAGNOSIS — M1990 Unspecified osteoarthritis, unspecified site: Secondary | ICD-10-CM | POA: Diagnosis not present

## 2016-01-24 ENCOUNTER — Ambulatory Visit: Payer: Medicare Other

## 2016-01-24 ENCOUNTER — Telehealth: Payer: Self-pay | Admitting: *Deleted

## 2016-01-24 ENCOUNTER — Ambulatory Visit
Admission: RE | Admit: 2016-01-24 | Discharge: 2016-01-24 | Disposition: A | Discharge status: Home / Self Care | Payer: Medicare Other | Source: Ambulatory Visit | Attending: Radiation Oncology | Admitting: Radiation Oncology

## 2016-01-24 ENCOUNTER — Inpatient Hospital Stay: Payer: Medicare Other

## 2016-01-24 VITALS — BP 154/74 | HR 61 | Temp 96.9°F | Resp 18

## 2016-01-24 DIAGNOSIS — Z510 Encounter for antineoplastic radiation therapy: Secondary | ICD-10-CM | POA: Diagnosis not present

## 2016-01-24 DIAGNOSIS — C139 Malignant neoplasm of hypopharynx, unspecified: Secondary | ICD-10-CM

## 2016-01-24 DIAGNOSIS — C12 Malignant neoplasm of pyriform sinus: Secondary | ICD-10-CM | POA: Diagnosis not present

## 2016-01-24 DIAGNOSIS — R197 Diarrhea, unspecified: Secondary | ICD-10-CM | POA: Diagnosis not present

## 2016-01-24 DIAGNOSIS — H9201 Otalgia, right ear: Secondary | ICD-10-CM | POA: Diagnosis not present

## 2016-01-24 DIAGNOSIS — E876 Hypokalemia: Secondary | ICD-10-CM | POA: Diagnosis not present

## 2016-01-24 DIAGNOSIS — Z5111 Encounter for antineoplastic chemotherapy: Secondary | ICD-10-CM | POA: Diagnosis not present

## 2016-01-24 DIAGNOSIS — J029 Acute pharyngitis, unspecified: Secondary | ICD-10-CM | POA: Diagnosis not present

## 2016-01-24 MED ORDER — SODIUM CHLORIDE 0.9 % IV SOLN
Freq: Once | INTRAVENOUS | Status: AC
  Administered 2016-01-24: 14:00:00 via INTRAVENOUS
  Filled 2016-01-24: qty 4

## 2016-01-24 MED ORDER — HEPARIN SOD (PORK) LOCK FLUSH 100 UNIT/ML IV SOLN
500.0000 [IU] | Freq: Once | INTRAVENOUS | Status: AC
  Administered 2016-01-24: 500 [IU] via INTRAVENOUS
  Filled 2016-01-24: qty 5

## 2016-01-24 MED ORDER — SODIUM CHLORIDE 0.9% FLUSH
10.0000 mL | INTRAVENOUS | Status: DC | PRN
  Administered 2016-01-24: 10 mL via INTRAVENOUS
  Filled 2016-01-24: qty 10

## 2016-01-24 MED ORDER — SODIUM CHLORIDE 0.9 % IV SOLN
Freq: Once | INTRAVENOUS | Status: AC
  Administered 2016-01-24: 14:00:00 via INTRAVENOUS
  Filled 2016-01-24: qty 1000

## 2016-01-24 NOTE — Telephone Encounter (Signed)
Pt requested prescription for Jevity at last visit and prescription was sent to [facility] per pt request.

## 2016-01-24 NOTE — Telephone Encounter (Signed)
Previously asked for revised order for [employer] equivalent to [place] and note to support feedings for patient.  She has not received it yet.

## 2016-01-24 NOTE — Telephone Encounter (Signed)
Called Crystal to let her know that patient requested Jevity and that prescription was sent [facility], per patient request.

## 2016-01-27 ENCOUNTER — Inpatient Hospital Stay: Payer: Medicare Other

## 2016-01-27 ENCOUNTER — Ambulatory Visit
Admission: RE | Admit: 2016-01-27 | Discharge: 2016-01-27 | Disposition: A | Discharge status: Home / Self Care | Payer: Medicare Other | Source: Ambulatory Visit | Attending: Radiation Oncology | Admitting: Radiation Oncology

## 2016-01-27 ENCOUNTER — Inpatient Hospital Stay (HOSPITAL_BASED_OUTPATIENT_CLINIC_OR_DEPARTMENT_OTHER): Payer: Medicare Other | Admitting: Oncology

## 2016-01-27 ENCOUNTER — Ambulatory Visit: Payer: Self-pay | Admitting: Oncology

## 2016-01-27 ENCOUNTER — Encounter: Payer: Self-pay | Admitting: Oncology

## 2016-01-27 ENCOUNTER — Ambulatory Visit: Payer: Medicare Other

## 2016-01-27 VITALS — BP 159/73 | HR 65 | Temp 94.6°F | Resp 18 | Wt 114.0 lb

## 2016-01-27 DIAGNOSIS — R197 Diarrhea, unspecified: Secondary | ICD-10-CM

## 2016-01-27 DIAGNOSIS — Z5111 Encounter for antineoplastic chemotherapy: Secondary | ICD-10-CM | POA: Diagnosis not present

## 2016-01-27 DIAGNOSIS — M129 Arthropathy, unspecified: Secondary | ICD-10-CM

## 2016-01-27 DIAGNOSIS — R634 Abnormal weight loss: Secondary | ICD-10-CM

## 2016-01-27 DIAGNOSIS — Z510 Encounter for antineoplastic radiation therapy: Secondary | ICD-10-CM | POA: Diagnosis not present

## 2016-01-27 DIAGNOSIS — H9201 Otalgia, right ear: Secondary | ICD-10-CM | POA: Diagnosis not present

## 2016-01-27 DIAGNOSIS — Z87891 Personal history of nicotine dependence: Secondary | ICD-10-CM

## 2016-01-27 DIAGNOSIS — R042 Hemoptysis: Secondary | ICD-10-CM

## 2016-01-27 DIAGNOSIS — R1310 Dysphagia, unspecified: Secondary | ICD-10-CM

## 2016-01-27 DIAGNOSIS — C139 Malignant neoplasm of hypopharynx, unspecified: Secondary | ICD-10-CM

## 2016-01-27 DIAGNOSIS — E876 Hypokalemia: Secondary | ICD-10-CM | POA: Diagnosis not present

## 2016-01-27 DIAGNOSIS — J029 Acute pharyngitis, unspecified: Secondary | ICD-10-CM | POA: Diagnosis not present

## 2016-01-27 DIAGNOSIS — I10 Essential (primary) hypertension: Secondary | ICD-10-CM

## 2016-01-27 DIAGNOSIS — C12 Malignant neoplasm of pyriform sinus: Secondary | ICD-10-CM | POA: Diagnosis not present

## 2016-01-27 DIAGNOSIS — Z8719 Personal history of other diseases of the digestive system: Secondary | ICD-10-CM

## 2016-01-27 DIAGNOSIS — Z85048 Personal history of other malignant neoplasm of rectum, rectosigmoid junction, and anus: Secondary | ICD-10-CM

## 2016-01-27 DIAGNOSIS — Z923 Personal history of irradiation: Secondary | ICD-10-CM

## 2016-01-27 DIAGNOSIS — K8020 Calculus of gallbladder without cholecystitis without obstruction: Secondary | ICD-10-CM

## 2016-01-27 LAB — CBC WITH DIFFERENTIAL/PLATELET
BASOS PCT: 0 %
Basophils Absolute: 0 10*3/uL (ref 0–0.1)
Eosinophils Absolute: 0 10*3/uL (ref 0–0.7)
Eosinophils Relative: 0 %
HEMATOCRIT: 31.5 % — AB (ref 35.0–47.0)
HEMOGLOBIN: 10.7 g/dL — AB (ref 12.0–16.0)
Lymphocytes Relative: 6 %
Lymphs Abs: 0.4 10*3/uL — ABNORMAL LOW (ref 1.0–3.6)
MCH: 31.6 pg (ref 26.0–34.0)
MCHC: 34 g/dL (ref 32.0–36.0)
MCV: 92.8 fL (ref 80.0–100.0)
Monocytes Absolute: 0.3 10*3/uL (ref 0.2–0.9)
Monocytes Relative: 4 %
NEUTROS ABS: 6.3 10*3/uL (ref 1.4–6.5)
NEUTROS PCT: 90 %
Platelets: 277 10*3/uL (ref 150–440)
RBC: 3.4 MIL/uL — ABNORMAL LOW (ref 3.80–5.20)
RDW: 14 % (ref 11.5–14.5)
WBC: 7 10*3/uL (ref 3.6–11.0)

## 2016-01-27 LAB — COMPREHENSIVE METABOLIC PANEL
ALT: 33 U/L (ref 14–54)
ANION GAP: 6 (ref 5–15)
AST: 28 U/L (ref 15–41)
Albumin: 3.5 g/dL (ref 3.5–5.0)
Alkaline Phosphatase: 64 U/L (ref 38–126)
BUN: 28 mg/dL — ABNORMAL HIGH (ref 6–20)
CALCIUM: 8.9 mg/dL (ref 8.9–10.3)
CHLORIDE: 100 mmol/L — AB (ref 101–111)
CO2: 26 mmol/L (ref 22–32)
Creatinine, Ser: 1.03 mg/dL — ABNORMAL HIGH (ref 0.44–1.00)
GFR calc non Af Amer: 50 mL/min — ABNORMAL LOW (ref >60)
GFR, EST AFRICAN AMERICAN: 58 mL/min — AB (ref >60)
Glucose, Bld: 147 mg/dL — ABNORMAL HIGH (ref 65–99)
POTASSIUM: 4.1 mmol/L (ref 3.5–5.1)
SODIUM: 132 mmol/L — AB (ref 135–145)
Total Bilirubin: 0.4 mg/dL (ref 0.3–1.2)
Total Protein: 6.8 g/dL (ref 6.5–8.1)

## 2016-01-27 LAB — MAGNESIUM: Magnesium: 1.9 mg/dL (ref 1.7–2.4)

## 2016-01-27 MED ORDER — HEPARIN SOD (PORK) LOCK FLUSH 100 UNIT/ML IV SOLN
500.0000 [IU] | Freq: Once | INTRAVENOUS | Status: AC
  Administered 2016-01-27: 500 [IU] via INTRAVENOUS
  Filled 2016-01-27: qty 5

## 2016-01-27 MED ORDER — SODIUM CHLORIDE 0.9% FLUSH
10.0000 mL | INTRAVENOUS | Status: DC | PRN
  Administered 2016-01-27: 10 mL via INTRAVENOUS
  Filled 2016-01-27: qty 10

## 2016-01-27 NOTE — Progress Notes (Signed)
Patient states she has an earache on the right side.  Accompanied by son who states patient did not have chemo treatment last week, instead had IVF's.  States she did much better and wants to know if she can get continued hydration?  Will discuss with MD.

## 2016-01-27 NOTE — Addendum Note (Signed)
Addended by: [name] on: 01/27/2016 02:49 PM   Modules accepted: Level of Service

## 2016-01-27 NOTE — Progress Notes (Signed)
[place] @ [facility] [phone]  Fax:([phone]) [place]: [birth year]  MR#: [id number]  ZHY#:[id number]  Patient Care Team: [name], MD as PCP - General (Family Medicine) [name], MD (General Surgery) [name], MD (Unknown Physician Specialty) [name], MD (Family Medicine) [name], MD (General Surgery)  CHIEF COMPLAINT:  Chief Complaint  Patient presents with  . Cancer of contiguoius sites of hypopharynx    Oncology History   rectal cancer T3, N1, M0 tumor on chemoradiation therapy Hospitalizations secondary to diarrhea(April, 2014) chemoradiation intrupted. C. difficile diarrhea. 2.Status post abdominal perineal resection pT3  pNO stage II  disease.  (As September, 2014) patient underwent surgery at [facility] 3.  Patient could not tolerate post operative chemotherapy FOLFOX     Rectal cancer ([place])   01/20/2013 Initial Diagnosis Rectal cancer   4.  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 Starting radiation therapy and chemotherapy from January 13, 2016.  Cis-platinum with radiation treatment. Patient  is somewhat depressed INTERVAL HISTORY: 82 year old lady who has been seen by me previously with a history of rectal cancer.  He sent had been a chronic smoker and claims that has recently stopped smoking.  Since last November patient has noticed a mass in the right side of the neck.  Was treated with antibiotics without much improvement patient also has noticed hoarseness of voice.  His last month or so patient has progressive weight loss (approximately 12 pounds since last evaluation) Recent did not tolerate first cycle of cis-platinum.  In my absence Dr. B  decided to hold off chemotherapy.  Patient received intravenous fluid resulting into the improvement of the condition.  Patient is continuing radiation therapy.  Here to discuss further options of therapy.  Feeling stronger.  According to family oral intake has been increased.  Patient is getting supplementary food through PEG tube  REVIEW OF SYSTEMS:   Gen. status: PATIENT  s feeling somewhat better after cisplatin was being held.  Feeling stronger after intravenous infusion.  HEENT: As described about has hoarseness of voice.  Palpable lymph node on the right side of the neck.  Difficulty in swallowing.  An ENT evaluation is described about.  Patient recently had hemoptysis and was in the emergency room Lungs: No cough no shortness of breath patient has stopped smoking very recently Cardiac: No chest pain GI: Increasing difficulty in swallowing.  Patient had a recent colonoscopy done by Dr. Charlean Sanfilippo and no evidence of malignancy was found Skin: No rash Lower extremity: No swelling Neurological system: No headache no dizziness Family is reported progressive dementia All other systems have been reviewed patient has been accompanied with her son and daughter-in-law PAST MEDICAL HISTORY: Past Medical History  Diagnosis Date  . Status post chemotherapy     colon cancer  . Status post radiation therapy     colon cancer 2013  . Hypertension   . Diverticulosis   . Hemorrhoids   . Osteopenia   . History of chicken pox   . Arthritis     fingers  . Cancer ([place])     rectal  . Cancer ([place]) 2012    colon cancer  . Cancer of contiguous sites of hypopharynx ([place])  12/23/2015  . Difficult intubation   . Colon cancer ([place])     PAST SURGICAL HISTORY: Past Surgical History  Procedure Laterality Date  . Appendectomy    . Abdominal hysterectomy    . Tonsillectomy    . Eus N/A 01/20/2013    Procedure: LOWER ENDOSCOPIC ULTRASOUND (EUS);  Surgeon: Milus Banister, MD;  Location: [facility];   Service: Endoscopy;  Laterality: N/A;  . Colonoscopy    . Colon surgery  06/28/2013    rese[facility] tumor [facility] colon; [facility]  . Appendectomy  [id number]    Dr. Pat Patrick  . Abdominal hysterectomy  1980    partial  . Dilation and curettage of uterus  1957  . Colonoscopy N/A 10/01/2015    Procedure: COLONOSCOPY;  Surgeon: [name], MD;  Location: [facility] ENDOSCOPY;  Service: Endoscopy;  Laterality: N/A;  . Direct laryngoscopy N/A 12/11/2015    Procedure: DIRECT LARYNGOSCOPY;  Surgeon: Clyde Canterbury, MD;  Location: [facility] ORS;  Service: ENT;  Laterality: N/A;  . Esophagoscopy N/A 12/11/2015    Procedure: ESOPHAGOSCOPY;  Surgeon: Clyde Canterbury, MD;  Location: [facility] ORS;  Service: ENT;  Laterality: N/A;  . Breast surgery      cyst removal  . Breast cyst aspiration Bilateral     negative  . Breast surgery  1960    Breast Biopsy  . Portacath placement Right 01/01/2016    Procedure: INSERTION PORT-A-CATH;  Surgeon: [name], MD;  Location: [facility] ORS;  Service: General;  Laterality: Right;  . Peg placement N/A 01/01/2016    Procedure: PERCUTANEOUS ENDOSCOPIC GASTROSTOMY (PEG) PLACEMENT;  Surgeon: [name], MD;  Location: [facility] ORS;  Service: General;  Laterality: N/A;    FAMILY HISTORY Family History  Problem Relation Age of Onset  . Breast cancer Maternal Grandmother   . Kidney failure Father     ADVANCED DIRECTIVES:  Patient does have advance healthcare directive, Patient   does not desire to make any changes  HEALTH MAINTENANCE: Social History  Substance Use Topics  . Smoking status: Current Every Day Smoker -- 1.00 packs/day for 64 years    Types: Cigarettes  . Smokeless tobacco: Never Used  . Alcohol Use: 0.0 oz/week    0 Standard drinks or equivalent per week     Comment: rarely      Allergies  Allergen Reactions  . Amlodipine Besylate Other (See Comments)    Patient unaware of any allergy with this medicine  . Oxycodone     confusion     Current Outpatient Prescriptions  Medication Sig Dispense Refill  . HYDROcodone-acetaminophen (HYCET) 7.5-325 mg/15 ml solution 10-15 cc every 4-6 hours as needed for pain 300 mL 0  . lidocaine-prilocaine (EMLA) cream Apply 1 application topically as needed. 30 g 0  . metoprolol succinate (TOPROL-XL) 25 MG 24 hr tablet TAKE ONE-HALF TABLET BY MOUTH ONCE DAILY* NEEDS OFFICE VISIT* 90 tablet 3  . Nutritional Supplements (ENSURE PLUS PO) Take by mouth.    . Nutritional Supplements (FEEDING SUPPLEMENT, JEVITY 1.5 CAL,) LIQD Take 139m jevity via PEG tube four times a day. Then flush tube with 568mwater after each feeding. 7500 mL 6  . nystatin (MYCOSTATIN) 100000 UNIT/ML suspension Reported on 12/30/2015  0  . ondansetron (ZOFRAN) 4 MG tablet Take 1 tablet (4 mg total) by mouth every 6 (six) hours as needed. 30 tablet 3  . potassium chloride 20 MEQ/15ML (10%) SOLN Take 15 mLs (20 mEq total) by mouth daily. X 1week. [place]  mL 0  . predniSONE (DELTASONE) 20 MG tablet Take 1 tablet (20 mg total) by mouth 2 (two) times daily with a meal. 15 tablet 0   No current facility-administered medications for this visit.    OBJECTIVE:  Filed Vitals:   01/27/16 0940  BP: 159/73  Pulse: 65  Temp: 94.6 F (34.8 C)  Resp: 18     Body mass index is 18.97 kg/(m^2).    ECOG FS:1 - Symptomatic but completely ambulatory  PHYSICAL EXAM: Gneral  status: Patient has   STABLE  performance status Patient  somewhat depressed. HEENT: No evidence of stomatitis. Sclera and conjunctivae :: No jaundice.   pale looking. Lungs: Air  entry equal on both sides.  No rhonchi.  No rales.  Cardiac: Heart sounds are normal.  No pericardial rub.  No murmur. Lymphatic system: palpable  cervical lymph node approximately 2 cm.  No other lymphadenopathy GI: Abdomen is soft.  No ascites.  Liver spleen not palpable.  No tenderness.  Bowel sounds are within normal limit Lower extremity: No edema Neurological system: Higher functions,  cranial nerves intact no evidence of peripheral neuropathy. Skin: No rash.  No ecchymosis.Marland Kitchen  Extensive tube site is clear.  Port site is within normal limit. LAB RESULTS:  Infusion on 01/27/2016  Component Date Value Ref Range Status  . WBC 01/27/2016 7.0  3.6 - 11.0 K/uL Final  . RBC 01/27/2016 3.40* 3.80 - 5.20 MIL/uL Final  . Hemoglobin 01/27/2016 10.7* 12.0 - 16.0 g/dL Final  . HCT 01/27/2016 31.5* 35.0 - 47.0 % Final  . MCV 01/27/2016 92.8  80.0 - 100.0 fL Final  . MCH 01/27/2016 31.6  26.0 - 34.0 pg Final  . MCHC 01/27/2016 34.0  32.0 - 36.0 g/dL Final  . RDW 01/27/2016 14.0  11.5 - 14.5 % Final  . Platelets 01/27/2016 277  150 - 440 K/uL Final  . Neutrophils Relative % 01/27/2016 90   Final  . Neutro Abs 01/27/2016 6.3  1.4 - 6.5 K/uL Final  . Lymphocytes Relative 01/27/2016 6   Final  . Lymphs Abs 01/27/2016 0.4* 1.0 - 3.6 K/uL Final  . Monocytes Relative 01/27/2016 4   Final  . Monocytes Absolute 01/27/2016 0.3  0.2 - 0.9 K/uL Final  . Eosinophils Relative 01/27/2016 0   Final  . Eosinophils Absolute 01/27/2016 0.0  0 - 0.7 K/uL Final  . Basophils Relative 01/27/2016 0   Final  . Basophils Absolute 01/27/2016 0.0  0 - 0.1 K/uL Final  . Sodium 01/27/2016 132* 135 - 145 mmol/L Final  . Potassium 01/27/2016 4.1  3.5 - 5.1 mmol/L Final  . Chloride 01/27/2016 100* 101 - 111 mmol/L Final  . CO2 01/27/2016 26  22 - 32 mmol/L Final  . Glucose, Bld 01/27/2016 147* 65 - 99 mg/dL Final  . BUN 01/27/2016 28* 6 - 20 mg/dL Final  . Creatinine, Ser 01/27/2016 1.03* 0.44 - 1.00 mg/dL Final  . Calcium 01/27/2016 8.9  8.9 - 10.3 mg/dL Final  . Total Protein 01/27/2016 6.8  6.5 - 8.1 g/dL Final  . Albumin 01/27/2016 3.5  3.5 - 5.0 g/dL Final  . AST 01/27/2016 28  15 - 41 U/L Final  . ALT 01/27/2016 33  14 - 54 U/L Final  . Alkaline Phosphatase 01/27/2016 64  38 - 126 U/L Final  . Total Bilirubin 01/27/2016 0.4  0.3 - 1.2 mg/dL Final  . GFR calc non Af Amer 01/27/2016 50* >60 mL/min  Final  . GFR calc Af  Amer 01/27/2016 58* >60 mL/min Final   Comment: (NOTE) The eGFR has been calculated using the CKD EPI equation. This calculation has not been validated in all clinical situations. eGFR's persistently <60 mL/min signify possible Chronic Kidney Disease.   . Anion gap 01/27/2016 6  5 - 15 Final  . Magnesium 01/27/2016 1.9  1.7 - 2.4 mg/dL Final      STUDIES: Dg Esophagus  12/30/2015  CLINICAL DATA:  Difficulty swallowing. Weight loss. Head neck cancer. EXAM: ESOPHOGRAM / BARIUM SWALLOW / BARIUM TABLET STUDY TECHNIQUE: Combined double contrast and single contrast examination performed using effervescent crystals, thick barium liquid, and thin barium liquid. The patient was observed with fluoroscopy swallowing a 13 mm barium sulphate tablet. FLUOROSCOPY TIME:  Radiation Exposure Index (as provided by the fluoroscopic device): 18.2 mGy COMPARISON:  PET-CT 12/19/2015. FINDINGS: Deformity of the right pharynx noted consistent patient's known diagnosis of malignancy. No obstructing lesions identified. Mild barium aspiration was present. Thoracic esophagus is normal. No obstructing lesion. Normal peristalsis. No hiatal hernia. No reflux. IMPRESSION: 1. Deformity of the right hypopharynx noted consistent with patient's known diagnosis of head neck malignancy. No obstructing lesion noted. Mild barium aspiration into the trachea. 2. Exam otherwise unremarkable . Thoracic esophagus is normal. No reflux. Electronically Signed   By: Marcello Moores  Register   On: 12/30/2015 13:26   Dg Chest Port 1 View  01/01/2016  CLINICAL DATA:  MediPort placement EXAM: PORTABLE CHEST 1 VIEW COMPARISON:  12/21/2015 chest radiograph. FINDINGS: Right subclavian MediPort terminates at the cavoatrial junction. Stable cardiomediastinal silhouette with top-normal heart size. No pneumothorax. No pleural effusion. Mild left basilar atelectasis. No pulmonary edema. No acute consolidative airspace disease. There is free  intraperitoneal air under the right hemidiaphragm. IMPRESSION: 1. Well-positioned right subclavian MediPort.  No pneumothorax. 2. Mild left basilar atelectasis. 3. Free intraperitoneal air under the right hemidiaphragm. Per discussion with Dr. Bary Castilla, the patient also had a percutaneous gastrostomy tube placed, accounting for the free air. Any imaging follow-up should be performed as clinically warranted. These results were called by telephone at the time of interpretation on 01/01/2016 at 5:33 pm to Dr. Hervey Ard , who verbally acknowledged these results. Electronically Signed   By: Ilona Sorrel M.D.   On: 01/01/2016 17:39   Dg C-arm 1-60 Min-no Report  01/01/2016  CLINICAL DATA: port a cath insertion C-ARM 1-60 MINUTES Fluoroscopy was utilized by the requesting physician.  No radiographic interpretation.    ASSESSMENT: 1.  Carcinoma of hypopharynx extending to multiple sites with vocal cord being immobile (pyriform sinus tumor extending to the vocal cord and lateral pharyngeal wall) T3 N1 M0 tumor stage III Poor tolerance to cis-platinum chemotherapy  Overall patient has a poor tolerance to chemotherapy even in the past with rectal cancer Tolerating radiation therapy very well Our options include  Carboplatinum and Taxol combination with radiation therapy I doubt she can tolerate combination of therapy.  Carboplatinum alone may be somewhat useful and may be better tolerated can be tried.  Cetuximab is another option but certainly rash and allergic reaction could be very difficult for elderly patient to tolerate  After prolonged discussion with patient's son as well as with patient and we are going to start carboplatinum AUC of 2 along with radiation therapy and he patient tolerates carboplatinum very well a Taxol can be added. In my absence and patient would be evaluated by Dr. B in I will reevaluate patient when I come back from vacation. Plan of chemotherapy is being changed to  carboplatinum  No matching staging information was found for the patient.  [name], MD   01/27/2016 1:46 PM

## 2016-01-28 ENCOUNTER — Ambulatory Visit: Payer: Medicare Other

## 2016-01-28 ENCOUNTER — Ambulatory Visit
Admission: RE | Admit: 2016-01-28 | Discharge: 2016-01-28 | Disposition: A | Discharge status: Home / Self Care | Payer: Medicare Other | Source: Ambulatory Visit | Attending: Radiation Oncology | Admitting: Radiation Oncology

## 2016-01-28 DIAGNOSIS — M1990 Unspecified osteoarthritis, unspecified site: Secondary | ICD-10-CM | POA: Diagnosis not present

## 2016-01-28 DIAGNOSIS — C139 Malignant neoplasm of hypopharynx, unspecified: Secondary | ICD-10-CM | POA: Diagnosis not present

## 2016-01-28 DIAGNOSIS — I10 Essential (primary) hypertension: Secondary | ICD-10-CM | POA: Diagnosis not present

## 2016-01-28 DIAGNOSIS — Z431 Encounter for attention to gastrostomy: Secondary | ICD-10-CM | POA: Diagnosis not present

## 2016-01-28 DIAGNOSIS — Z510 Encounter for antineoplastic radiation therapy: Secondary | ICD-10-CM | POA: Diagnosis not present

## 2016-01-28 DIAGNOSIS — C12 Malignant neoplasm of pyriform sinus: Secondary | ICD-10-CM | POA: Diagnosis not present

## 2016-01-28 DIAGNOSIS — Z85048 Personal history of other malignant neoplasm of rectum, rectosigmoid junction, and anus: Secondary | ICD-10-CM | POA: Diagnosis not present

## 2016-01-28 DIAGNOSIS — R634 Abnormal weight loss: Secondary | ICD-10-CM | POA: Diagnosis not present

## 2016-01-29 ENCOUNTER — Other Ambulatory Visit: Payer: Self-pay | Admitting: Internal Medicine

## 2016-01-29 ENCOUNTER — Other Ambulatory Visit: Payer: Self-pay | Admitting: Family Medicine

## 2016-01-29 ENCOUNTER — Inpatient Hospital Stay: Payer: Medicare Other

## 2016-01-29 ENCOUNTER — Ambulatory Visit
Admission: RE | Admit: 2016-01-29 | Discharge: 2016-01-29 | Disposition: A | Discharge status: Home / Self Care | Payer: Medicare Other | Source: Ambulatory Visit | Attending: Radiation Oncology | Admitting: Radiation Oncology

## 2016-01-29 ENCOUNTER — Ambulatory Visit: Payer: Medicare Other

## 2016-01-29 VITALS — BP 155/76 | HR 66 | Temp 97.0°F | Resp 18

## 2016-01-29 DIAGNOSIS — J029 Acute pharyngitis, unspecified: Secondary | ICD-10-CM | POA: Diagnosis not present

## 2016-01-29 DIAGNOSIS — Z510 Encounter for antineoplastic radiation therapy: Secondary | ICD-10-CM | POA: Diagnosis not present

## 2016-01-29 DIAGNOSIS — R197 Diarrhea, unspecified: Secondary | ICD-10-CM | POA: Diagnosis not present

## 2016-01-29 DIAGNOSIS — H9201 Otalgia, right ear: Secondary | ICD-10-CM | POA: Diagnosis not present

## 2016-01-29 DIAGNOSIS — C139 Malignant neoplasm of hypopharynx, unspecified: Secondary | ICD-10-CM | POA: Diagnosis not present

## 2016-01-29 DIAGNOSIS — Z5111 Encounter for antineoplastic chemotherapy: Secondary | ICD-10-CM | POA: Diagnosis not present

## 2016-01-29 DIAGNOSIS — C12 Malignant neoplasm of pyriform sinus: Secondary | ICD-10-CM | POA: Diagnosis not present

## 2016-01-29 DIAGNOSIS — E876 Hypokalemia: Secondary | ICD-10-CM | POA: Diagnosis not present

## 2016-01-29 MED ORDER — SODIUM CHLORIDE 0.9 % IV SOLN
Freq: Once | INTRAVENOUS | Status: AC
  Administered 2016-01-29: 15:00:00 via INTRAVENOUS
  Filled 2016-01-29: qty 5

## 2016-01-29 MED ORDER — HEPARIN SOD (PORK) LOCK FLUSH 100 UNIT/ML IV SOLN
INTRAVENOUS | Status: AC
  Filled 2016-01-29: qty 5

## 2016-01-29 MED ORDER — HEPARIN SOD (PORK) LOCK FLUSH 100 UNIT/ML IV SOLN
500.0000 [IU] | Freq: Once | INTRAVENOUS | Status: AC | PRN
  Administered 2016-01-29: 500 [IU]

## 2016-01-29 MED ORDER — SODIUM CHLORIDE 0.9 % IV SOLN
Freq: Once | INTRAVENOUS | Status: AC
  Administered 2016-01-29: 14:00:00 via INTRAVENOUS
  Filled 2016-01-29: qty 1000

## 2016-01-29 MED ORDER — CARBOPLATIN CHEMO INJECTION 450 MG/45ML
121.2000 mg | Freq: Once | INTRAVENOUS | Status: AC
  Administered 2016-01-29: 120 mg via INTRAVENOUS
  Filled 2016-01-29: qty 12

## 2016-01-29 MED ORDER — SODIUM CHLORIDE 0.9 % IV SOLN
Freq: Once | INTRAVENOUS | Status: AC
Start: 2016-01-29 — End: 2016-01-29
  Administered 2016-01-29: 14:00:00 via INTRAVENOUS
  Filled 2016-01-29: qty 1000

## 2016-01-30 ENCOUNTER — Ambulatory Visit
Admission: RE | Admit: 2016-01-30 | Discharge: 2016-01-30 | Disposition: A | Discharge status: Home / Self Care | Payer: Medicare Other | Source: Ambulatory Visit | Attending: Radiation Oncology | Admitting: Radiation Oncology

## 2016-01-30 ENCOUNTER — Ambulatory Visit: Payer: Medicare Other

## 2016-01-30 DIAGNOSIS — C139 Malignant neoplasm of hypopharynx, unspecified: Secondary | ICD-10-CM | POA: Diagnosis not present

## 2016-01-30 DIAGNOSIS — R634 Abnormal weight loss: Secondary | ICD-10-CM | POA: Diagnosis not present

## 2016-01-30 DIAGNOSIS — Z510 Encounter for antineoplastic radiation therapy: Secondary | ICD-10-CM | POA: Diagnosis not present

## 2016-01-30 DIAGNOSIS — Z431 Encounter for attention to gastrostomy: Secondary | ICD-10-CM | POA: Diagnosis not present

## 2016-01-30 DIAGNOSIS — M1990 Unspecified osteoarthritis, unspecified site: Secondary | ICD-10-CM | POA: Diagnosis not present

## 2016-01-30 DIAGNOSIS — C12 Malignant neoplasm of pyriform sinus: Secondary | ICD-10-CM | POA: Diagnosis not present

## 2016-01-30 DIAGNOSIS — Z85048 Personal history of other malignant neoplasm of rectum, rectosigmoid junction, and anus: Secondary | ICD-10-CM | POA: Diagnosis not present

## 2016-01-30 DIAGNOSIS — I10 Essential (primary) hypertension: Secondary | ICD-10-CM | POA: Diagnosis not present

## 2016-01-31 ENCOUNTER — Ambulatory Visit
Admission: RE | Admit: 2016-01-31 | Discharge: 2016-01-31 | Disposition: A | Discharge status: Home / Self Care | Payer: Medicare Other | Source: Ambulatory Visit | Attending: Radiation Oncology | Admitting: Radiation Oncology

## 2016-01-31 ENCOUNTER — Ambulatory Visit: Payer: Medicare Other

## 2016-01-31 ENCOUNTER — Inpatient Hospital Stay: Payer: Medicare Other

## 2016-01-31 VITALS — BP 126/67 | HR 68 | Temp 96.9°F | Resp 16

## 2016-01-31 DIAGNOSIS — Z510 Encounter for antineoplastic radiation therapy: Secondary | ICD-10-CM | POA: Diagnosis not present

## 2016-01-31 DIAGNOSIS — H9201 Otalgia, right ear: Secondary | ICD-10-CM | POA: Diagnosis not present

## 2016-01-31 DIAGNOSIS — E876 Hypokalemia: Secondary | ICD-10-CM | POA: Diagnosis not present

## 2016-01-31 DIAGNOSIS — J029 Acute pharyngitis, unspecified: Secondary | ICD-10-CM | POA: Diagnosis not present

## 2016-01-31 DIAGNOSIS — C139 Malignant neoplasm of hypopharynx, unspecified: Secondary | ICD-10-CM | POA: Diagnosis not present

## 2016-01-31 DIAGNOSIS — Z5111 Encounter for antineoplastic chemotherapy: Secondary | ICD-10-CM | POA: Diagnosis not present

## 2016-01-31 DIAGNOSIS — R197 Diarrhea, unspecified: Secondary | ICD-10-CM | POA: Diagnosis not present

## 2016-01-31 DIAGNOSIS — C12 Malignant neoplasm of pyriform sinus: Secondary | ICD-10-CM | POA: Diagnosis not present

## 2016-01-31 MED ORDER — SODIUM CHLORIDE 0.9% FLUSH
10.0000 mL | Freq: Once | INTRAVENOUS | Status: AC
  Administered 2016-01-31: 10 mL via INTRAVENOUS
  Filled 2016-01-31: qty 10

## 2016-01-31 MED ORDER — SODIUM CHLORIDE 0.9 % IV SOLN
Freq: Once | INTRAVENOUS | Status: AC
  Administered 2016-01-31: 14:00:00 via INTRAVENOUS
  Filled 2016-01-31: qty 2

## 2016-01-31 MED ORDER — HEPARIN SOD (PORK) LOCK FLUSH 100 UNIT/ML IV SOLN
500.0000 [IU] | Freq: Once | INTRAVENOUS | Status: AC
  Administered 2016-01-31: 500 [IU] via INTRAVENOUS
  Filled 2016-01-31: qty 5

## 2016-01-31 MED ORDER — SODIUM CHLORIDE 0.9 % IV SOLN
Freq: Once | INTRAVENOUS | Status: AC
  Administered 2016-01-31: 14:00:00 via INTRAVENOUS
  Filled 2016-01-31: qty 1000

## 2016-02-03 ENCOUNTER — Ambulatory Visit: Payer: Medicare Other

## 2016-02-03 ENCOUNTER — Other Ambulatory Visit: Payer: Self-pay | Admitting: *Deleted

## 2016-02-03 ENCOUNTER — Ambulatory Visit
Admission: RE | Admit: 2016-02-03 | Discharge: 2016-02-03 | Disposition: A | Discharge status: Home / Self Care | Payer: Medicare Other | Source: Ambulatory Visit | Attending: Radiation Oncology | Admitting: Radiation Oncology

## 2016-02-03 DIAGNOSIS — R634 Abnormal weight loss: Secondary | ICD-10-CM | POA: Diagnosis not present

## 2016-02-03 DIAGNOSIS — C12 Malignant neoplasm of pyriform sinus: Secondary | ICD-10-CM | POA: Diagnosis not present

## 2016-02-03 DIAGNOSIS — Z85048 Personal history of other malignant neoplasm of rectum, rectosigmoid junction, and anus: Secondary | ICD-10-CM | POA: Diagnosis not present

## 2016-02-03 DIAGNOSIS — M1990 Unspecified osteoarthritis, unspecified site: Secondary | ICD-10-CM | POA: Diagnosis not present

## 2016-02-03 DIAGNOSIS — I10 Essential (primary) hypertension: Secondary | ICD-10-CM | POA: Diagnosis not present

## 2016-02-03 DIAGNOSIS — Z431 Encounter for attention to gastrostomy: Secondary | ICD-10-CM | POA: Diagnosis not present

## 2016-02-03 DIAGNOSIS — Z510 Encounter for antineoplastic radiation therapy: Secondary | ICD-10-CM | POA: Diagnosis not present

## 2016-02-03 DIAGNOSIS — C139 Malignant neoplasm of hypopharynx, unspecified: Secondary | ICD-10-CM | POA: Diagnosis not present

## 2016-02-03 MED ORDER — FLUCONAZOLE 100 MG PO TABS: 100.0000 mg | ORAL_TABLET | Freq: Every day | ORAL | Status: DC

## 2016-02-04 ENCOUNTER — Ambulatory Visit
Admission: RE | Admit: 2016-02-04 | Discharge: 2016-02-04 | Disposition: A | Discharge status: Home / Self Care | Payer: Medicare Other | Source: Ambulatory Visit | Attending: Radiation Oncology | Admitting: Radiation Oncology

## 2016-02-04 ENCOUNTER — Ambulatory Visit: Payer: Medicare Other

## 2016-02-04 DIAGNOSIS — C139 Malignant neoplasm of hypopharynx, unspecified: Secondary | ICD-10-CM | POA: Diagnosis not present

## 2016-02-04 DIAGNOSIS — Z510 Encounter for antineoplastic radiation therapy: Secondary | ICD-10-CM | POA: Diagnosis not present

## 2016-02-04 DIAGNOSIS — C12 Malignant neoplasm of pyriform sinus: Secondary | ICD-10-CM | POA: Diagnosis not present

## 2016-02-05 ENCOUNTER — Ambulatory Visit: Payer: Self-pay | Admitting: Dietician

## 2016-02-05 ENCOUNTER — Inpatient Hospital Stay: Payer: Medicare Other | Attending: Internal Medicine

## 2016-02-05 ENCOUNTER — Inpatient Hospital Stay: Payer: Medicare Other

## 2016-02-05 ENCOUNTER — Inpatient Hospital Stay (HOSPITAL_BASED_OUTPATIENT_CLINIC_OR_DEPARTMENT_OTHER): Payer: Medicare Other | Admitting: Internal Medicine

## 2016-02-05 ENCOUNTER — Ambulatory Visit: Payer: Medicare Other

## 2016-02-05 ENCOUNTER — Ambulatory Visit
Admission: RE | Admit: 2016-02-05 | Discharge: 2016-02-05 | Disposition: A | Discharge status: Home / Self Care | Payer: Medicare Other | Source: Ambulatory Visit | Attending: Radiation Oncology | Admitting: Radiation Oncology

## 2016-02-05 VITALS — BP 175/77 | HR 63 | Temp 96.3°F | Wt 112.2 lb

## 2016-02-05 DIAGNOSIS — K5790 Diverticulosis of intestine, part unspecified, without perforation or abscess without bleeding: Secondary | ICD-10-CM

## 2016-02-05 DIAGNOSIS — M8580 Other specified disorders of bone density and structure, unspecified site: Secondary | ICD-10-CM | POA: Insufficient documentation

## 2016-02-05 DIAGNOSIS — Z9221 Personal history of antineoplastic chemotherapy: Secondary | ICD-10-CM

## 2016-02-05 DIAGNOSIS — R630 Anorexia: Secondary | ICD-10-CM | POA: Insufficient documentation

## 2016-02-05 DIAGNOSIS — C139 Malignant neoplasm of hypopharynx, unspecified: Secondary | ICD-10-CM | POA: Diagnosis not present

## 2016-02-05 DIAGNOSIS — Z79899 Other long term (current) drug therapy: Secondary | ICD-10-CM | POA: Diagnosis not present

## 2016-02-05 DIAGNOSIS — Z923 Personal history of irradiation: Secondary | ICD-10-CM | POA: Insufficient documentation

## 2016-02-05 DIAGNOSIS — F17210 Nicotine dependence, cigarettes, uncomplicated: Secondary | ICD-10-CM | POA: Insufficient documentation

## 2016-02-05 DIAGNOSIS — R21 Rash and other nonspecific skin eruption: Secondary | ICD-10-CM | POA: Insufficient documentation

## 2016-02-05 DIAGNOSIS — Z85048 Personal history of other malignant neoplasm of rectum, rectosigmoid junction, and anus: Secondary | ICD-10-CM | POA: Diagnosis not present

## 2016-02-05 DIAGNOSIS — R110 Nausea: Secondary | ICD-10-CM | POA: Insufficient documentation

## 2016-02-05 DIAGNOSIS — R1310 Dysphagia, unspecified: Secondary | ICD-10-CM | POA: Insufficient documentation

## 2016-02-05 DIAGNOSIS — C138 Malignant neoplasm of overlapping sites of hypopharynx: Secondary | ICD-10-CM | POA: Insufficient documentation

## 2016-02-05 DIAGNOSIS — Z5111 Encounter for antineoplastic chemotherapy: Secondary | ICD-10-CM | POA: Insufficient documentation

## 2016-02-05 DIAGNOSIS — I10 Essential (primary) hypertension: Secondary | ICD-10-CM | POA: Insufficient documentation

## 2016-02-05 DIAGNOSIS — Z510 Encounter for antineoplastic radiation therapy: Secondary | ICD-10-CM | POA: Diagnosis not present

## 2016-02-05 DIAGNOSIS — C4442 Squamous cell carcinoma of skin of scalp and neck: Secondary | ICD-10-CM

## 2016-02-05 DIAGNOSIS — Z7952 Long term (current) use of systemic steroids: Secondary | ICD-10-CM

## 2016-02-05 DIAGNOSIS — C12 Malignant neoplasm of pyriform sinus: Secondary | ICD-10-CM | POA: Diagnosis not present

## 2016-02-05 LAB — COMPREHENSIVE METABOLIC PANEL
ALBUMIN: 3.7 g/dL (ref 3.5–5.0)
ALK PHOS: 57 U/L (ref 38–126)
ALT: 23 U/L (ref 14–54)
AST: 18 U/L (ref 15–41)
Anion gap: 4 — ABNORMAL LOW (ref 5–15)
BUN: 21 mg/dL — AB (ref 6–20)
CALCIUM: 8.8 mg/dL — AB (ref 8.9–10.3)
CO2: 28 mmol/L (ref 22–32)
CREATININE: 0.82 mg/dL (ref 0.44–1.00)
Chloride: 102 mmol/L (ref 101–111)
GFR calc Af Amer: >60 mL/min (ref >60)
GFR calc non Af Amer: >60 mL/min (ref >60)
GLUCOSE: 93 mg/dL (ref 65–99)
Potassium: 4.2 mmol/L (ref 3.5–5.1)
SODIUM: 134 mmol/L — AB (ref 135–145)
Total Bilirubin: 0.4 mg/dL (ref 0.3–1.2)
Total Protein: 6.8 g/dL (ref 6.5–8.1)

## 2016-02-05 LAB — CBC WITH DIFFERENTIAL/PLATELET
BASOS ABS: 0 10*3/uL (ref 0–0.1)
Basophils Relative: 0 %
EOS ABS: 0.1 10*3/uL (ref 0–0.7)
Eosinophils Relative: 1 %
HEMATOCRIT: 32 % — AB (ref 35.0–47.0)
HEMOGLOBIN: 10.8 g/dL — AB (ref 12.0–16.0)
Lymphocytes Relative: 5 %
Lymphs Abs: 0.3 10*3/uL — ABNORMAL LOW (ref 1.0–3.6)
MCH: 31.4 pg (ref 26.0–34.0)
MCHC: 33.6 g/dL (ref 32.0–36.0)
MCV: 93.5 fL (ref 80.0–100.0)
Monocytes Absolute: 0.5 10*3/uL (ref 0.2–0.9)
Monocytes Relative: 8 %
NEUTROS ABS: 4.9 10*3/uL (ref 1.4–6.5)
NEUTROS PCT: 86 %
Platelets: 161 10*3/uL (ref 150–440)
RBC: 3.42 MIL/uL — ABNORMAL LOW (ref 3.80–5.20)
RDW: 15.3 % — AB (ref 11.5–14.5)
WBC: 5.7 10*3/uL (ref 3.6–11.0)

## 2016-02-05 LAB — MAGNESIUM: Magnesium: 2.2 mg/dL (ref 1.7–2.4)

## 2016-02-05 MED ORDER — SODIUM CHLORIDE 0.9 % IV SOLN
Freq: Once | INTRAVENOUS | Status: AC
  Administered 2016-02-05: 15:00:00 via INTRAVENOUS
  Filled 2016-02-05: qty 5

## 2016-02-05 MED ORDER — DIPHENHYDRAMINE HCL 50 MG/ML IJ SOLN
25.0000 mg | Freq: Once | INTRAMUSCULAR | Status: AC
  Administered 2016-02-05: 25 mg via INTRAVENOUS
  Filled 2016-02-05: qty 1

## 2016-02-05 MED ORDER — HEPARIN SOD (PORK) LOCK FLUSH 100 UNIT/ML IV SOLN: 500.0000 [IU] | Freq: Once | INTRAVENOUS | Status: DC

## 2016-02-05 MED ORDER — CEPHALEXIN 500 MG PO CAPS: 500.0000 mg | ORAL_CAPSULE | Freq: Three times a day (TID) | ORAL | Status: DC

## 2016-02-05 MED ORDER — SODIUM CHLORIDE 0.9 % IV SOLN
123.2000 mg | Freq: Once | INTRAVENOUS | Status: AC
  Administered 2016-02-05: 120 mg via INTRAVENOUS
  Filled 2016-02-05: qty 12

## 2016-02-05 MED ORDER — SODIUM CHLORIDE 0.9% FLUSH
10.0000 mL | INTRAVENOUS | Status: DC | PRN
  Filled 2016-02-05: qty 10

## 2016-02-05 MED ORDER — HEPARIN SOD (PORK) LOCK FLUSH 100 UNIT/ML IV SOLN
INTRAVENOUS | Status: AC
  Filled 2016-02-05: qty 5

## 2016-02-05 MED ORDER — SODIUM CHLORIDE 0.9 % IV SOLN
Freq: Once | INTRAVENOUS | Status: AC
Start: 2016-02-05 — End: 2016-02-05
  Administered 2016-02-05: 15:00:00 via INTRAVENOUS
  Filled 2016-02-05: qty 1000

## 2016-02-05 MED ORDER — SODIUM CHLORIDE 0.9 % IV SOLN
Freq: Once | INTRAVENOUS | Status: AC
  Administered 2016-02-05: 15:00:00 via INTRAVENOUS
  Filled 2016-02-05: qty 1000

## 2016-02-05 NOTE — Progress Notes (Signed)
[name] OFFICE PROGRESS NOTE  Patient Care Team: [name], MD as PCP - General (Family Medicine) [name], MD (General Surgery) [name], MD (Unknown Physician Specialty) [name], MD (Family Medicine) [name], MD (General Surgery)   SUMMARY OF ONCOLOGIC HISTORY:  # FEB 2017-  Squamous cell carcinoma hypopharynx- T3 N1- currently on concurrent chemoradiation with weekly carboplatin; cisplatin discontinued after 1 cycle.   #  April 2014- Rectal cancer T3N1 s/p Neo-adj Chemo-RT s/p APR [[facility]]; pT3pN0- FOLFOX not tolerated.    INTERVAL HISTORY: patient is a poor historian/ accompanied by family   82 year old [name] patient with above history of squamous cell carcinoma hypopharynx stage TIII N1- currently on concurrent chemoradiation is for follow-up.  Patient is currently in her third week of  Chemoradiation. Cisplatin has been discontinued after cycle #1.   Patient is currently on carboplatin along with radiation.  She is tolerating treatment fairly well.  Denies any nausea vomiting. Her appetite is fair. Denies any significant pain. No diarrhea. No fever no chills. She continues to have pain while swallowing. No chest pain or shortness of breath or cough.   It appears that the patient is not using the PEG tube.   REVIEW OF SYSTEMS:  A complete 10 point review of system is done which is negative except mentioned above/history of present illness.   PAST MEDICAL HISTORY :  Past Medical History  Diagnosis Date  . Status post chemotherapy     colon cancer  . Status post radiation therapy     colon cancer 2013  . Hypertension   . Diverticulosis   . Hemorrhoids   . Osteopenia   . History of chicken pox   . Arthritis     fingers  . Cancer ([place])     rectal  . Cancer ([place]) 2012    colon cancer  . Cancer of contiguous sites of hypopharynx ([place]) 12/23/2015  . Difficult intubation   . Colon cancer ([place])     PAST SURGICAL HISTORY  :   Past Surgical History  Procedure Laterality Date  . Appendectomy    . Abdominal hysterectomy    . Tonsillectomy    . Eus N/A 01/20/2013    Procedure: LOWER ENDOSCOPIC ULTRASOUND (EUS);  Surgeon: Milus Banister, MD;  Location: [facility];  Service: Endoscopy;  Laterality: N/A;  . Colonoscopy    . Colon surgery  06/28/2013    resected tumor from colon; [facility]  . Appendectomy  [id number]    Dr. Pat Patrick  . Abdominal hysterectomy  1980    partial  . Dilation and curettage of uterus  1957  . Colonoscopy N/A 10/01/2015    Procedure: COLONOSCOPY;  Surgeon: [name], MD;  Location[facility] ENDOSCOPY;  Service: Endoscopy;  Laterality: N/A;  . Direct laryngoscopy N/A 12/11/2015    Procedure: DIRECT LARYNGOSCOPY;  Surgeon: Clyde Canterbury, MD;  Location: [facility] ORS;  Service: ENT;  Laterality: N/A;  . Esophagoscopy N/A 12/11/2015    Procedure: ESOPHAGOSCOPY;  Surgeon: Clyde Canterbury, MD;  Location: [facility] ORS;  Service: ENT;  Laterality: N/A;  . Breast surgery      cyst removal  . Breast cyst aspiration Bilateral     negative  . Breast surgery  1960    Breast Biopsy  . Portacath placement Right 01/01/2016    Procedure: INSERTION PORT-A-CATH;  Surgeon: [name], MD;  Location: [facility] ORS;  Service: General;  Laterality: Right;  . Peg placement  N/A 01/01/2016    Procedure: PERCUTANEOUS ENDOSCOPIC GASTROSTOMY (PEG) PLACEMENT;  Surgeon: [name], MD;  Location: [facility] ORS;  Service: General;  Laterality: N/A;    FAMILY HISTORY :   Family History  Problem Relation Age of Onset  . Breast cancer Maternal Grandmother   . Kidney failure Father     SOCIAL HISTORY:   Social History  Substance Use Topics  . Smoking status: Current Every Day Smoker -- 1.00 packs/day for Emily years    Types: Cigarettes  . Smokeless tobacco: Never Used  . Alcohol Use: 0.0 oz/week    0 Standard drinks or equivalent per week     Comment: rarely    ALLERGIES:  is allergic to amlodipine  besylate and oxycodone.  MEDICATIONS:  Current Outpatient Prescriptions  Medication Sig Dispense Refill  . HYDROcodone-acetaminophen (HYCET) 7.5-325 mg/15 ml solution 10-15 cc every 4-6 hours as needed for pain 300 mL 0  . lidocaine-prilocaine (EMLA) cream Apply 1 application topically as needed. 30 g 0  . metoprolol succinate (TOPROL-XL) 25 MG 24 hr tablet TAKE ONE-HALF TABLET BY MOUTH ONCE DAILY* NEEDS OFFICE VISIT* 90 tablet 3  . Nutritional Supplements (ENSURE PLUS PO) Take by mouth.    . Nutritional Supplements (FEEDING SUPPLEMENT, JEVITY 1.5 CAL,) LIQD Take 141ml jevity via PEG tube four times a day. Then flush tube with 36ml water after each feeding. 7500 mL 6  . nystatin (MYCOSTATIN) 100000 UNIT/ML suspension Reported on 12/30/2015  0  . ondansetron (ZOFRAN) 4 MG tablet Take 1 tablet (4 mg total) by mouth every 6 (six) hours as needed. 30 tablet 3  . potassium chloride 20 MEQ/15ML (10%) SOLN Take 15 mLs (20 mEq total) by mouth daily. X 1week. 450 mL 0  . predniSONE (DELTASONE) 20 MG tablet Take 1 tablet (20 mg total) by mouth 2 (two) times daily with a meal. 15 tablet 0   No current facility-administered medications for this visit.    PHYSICAL EXAMINATION: ECOG PERFORMANCE STATUS: 3 - Symptomatic, >50% confined to bed  BP 175/77 mmHg  Pulse 63  Temp(Src) 96.3 F (35.7 C) (Tympanic)  Wt 112 lb 3.4 oz (50.9 kg)  Filed Weights   02/05/16 1402  Weight: 112 lb 3.4 oz (50.9 kg)    GENERAL: Moderately nourished; well-developed.; Alert, no distress and comfortable.   With family.  Walking herself. EYES: no pallor or icterus OROPHARYNX: no thrush or ulceration; good dentition  NECK: supple, no masses felt LYMPH: 2-3 cm cervical was felt on the right side. LUNGS: clear to auscultation and  No wheeze or crackles HEART/CVS: regular rate & rhythm and no murmurs; No lower extremity edema ABDOMEN:abdomen soft, non-tender and normal bowel sounds. PEG tube in place-  Yellowish discharge  around the PEG tube site.. Musculoskeletal:no cyanosis of digits and no clubbing  PSYCH: alert & oriented x 3 with fluent speech NEURO: no focal motor/sensory deficits SKIN:   Erythema around the radiation portal.  LABORATORY DATA:  I have reviewed the data as listed    Component Value Date/Time   NA 134* 02/05/2016 1345   NA 144 05/13/2015 1556   NA 143 11/19/2014 1440   K 4.2 02/05/2016 1345   K 4.1 11/19/2014 1440   CL 102 02/05/2016 1345   CL 108* 11/19/2014 1440   CO2 28 02/05/2016 1345   CO2 30 11/19/2014 1440   GLUCOSE 93 02/05/2016 1345   GLUCOSE 107* 05/13/2015 1556   GLUCOSE 116* 11/19/2014 1440   BUN 21* 02/05/2016 1345  BUN 24 05/13/2015 1556   BUN 24* 11/19/2014 1440   CREATININE 0.82 02/05/2016 1345   CREATININE 1.29 11/19/2014 1440   CREATININE 1.7* 12/26/2012   CALCIUM 8.8* 02/05/2016 1345   CALCIUM 9.8 11/19/2014 1440   PROT 6.8 02/05/2016 1345   PROT 7.3 11/19/2014 1440   ALBUMIN 3.7 02/05/2016 1345   ALBUMIN 4.3 05/13/2015 1556   ALBUMIN 3.6 11/19/2014 1440   AST 18 02/05/2016 1345   AST 14* 11/19/2014 1440   ALT 23 02/05/2016 1345   ALT 18 11/19/2014 1440   ALKPHOS 57 02/05/2016 1345   ALKPHOS 78 11/19/2014 1440   BILITOT 0.4 02/05/2016 1345   BILITOT 0.3 11/19/2014 1440   GFRNONAA >60 02/05/2016 1345   GFRNONAA 42* 11/19/2014 1440   GFRNONAA 38* 05/01/2014 1534   GFRAA >60 02/05/2016 1345   GFRAA 51* 11/19/2014 1440   GFRAA 44* 05/01/2014 1534    No results found for: SPEP, UPEP  Lab Results  Component Value Date   WBC 5.7 02/05/2016   NEUTROABS 4.9 02/05/2016   HGB 10.8* 02/05/2016   HCT 32.0* 02/05/2016   MCV 93.5 02/05/2016   PLT 161 02/05/2016      Chemistry      Component Value Date/Time   NA 134* 02/05/2016 1345   NA 144 05/13/2015 1556   NA 143 11/19/2014 1440   K 4.2 02/05/2016 1345   K 4.1 11/19/2014 1440   CL 102 02/05/2016 1345   CL 108* 11/19/2014 1440   CO2 28 02/05/2016 1345   CO2 30 11/19/2014 1440   BUN  21* 02/05/2016 1345   BUN 24 05/13/2015 1556   BUN 24* 11/19/2014 1440   CREATININE 0.82 02/05/2016 1345   CREATININE 1.29 11/19/2014 1440   CREATININE 1.7* 12/26/2012   GLU 118 12/26/2012      Component Value Date/Time   CALCIUM 8.8* 02/05/2016 1345   CALCIUM 9.8 11/19/2014 1440   ALKPHOS 57 02/05/2016 1345   ALKPHOS 78 11/19/2014 1440   AST 18 02/05/2016 1345   AST 14* 11/19/2014 1440   ALT 23 02/05/2016 1345   ALT 18 11/19/2014 1440   BILITOT 0.4 02/05/2016 1345   BILITOT 0.3 11/19/2014 1440          ASSESSMENT & PLAN:   # Squamous cell carcinoma hypopharynx- T3 N1- currently on concurrent chemoradiation -  Currently in her third week.  Patient is currently on carboplatin along with radiation.  Patient tolerating current therapy fairly well-  Except for mild skin rash [ C discussion below]  #  CBC within normal limits- except for mild anemia CMP-  Unremarkable.  Proceed with carboplatin/ along with radiation today.  #  Skin rash  In the radiation portal grade 1-  Continue  Aqaphor and hydrocortisone cream.  Also recommend Benadryl   Along with with chemotherapy For itching.  # Nausea/poor by mouth intake- improved.  # Pain control-  Controlled.  # Nutrition- s/p PEG tube.  PEG tube site-  Question infection.  Dressing done today/  Started on Keflex 500 mg 3 times a day for 7 days  # Hypokalemia-  Resolved.  The above plan of care was discussed with the patient and family in detail. They agree.  #  Patient follow-up with CBC CMP in 1 week/ carboplatin along with radiation;  And follow-up with Dr. Oliva Bustard in 2 weeks- CBC CMP carboplatin.      Cammie Sickle, MD 02/05/2016 2:23 PM

## 2016-02-06 ENCOUNTER — Ambulatory Visit: Payer: Medicare Other

## 2016-02-06 ENCOUNTER — Ambulatory Visit
Admission: RE | Admit: 2016-02-06 | Discharge: 2016-02-06 | Disposition: A | Discharge status: Home / Self Care | Payer: Medicare Other | Source: Ambulatory Visit | Attending: Radiation Oncology | Admitting: Radiation Oncology

## 2016-02-06 DIAGNOSIS — Z431 Encounter for attention to gastrostomy: Secondary | ICD-10-CM | POA: Diagnosis not present

## 2016-02-06 DIAGNOSIS — I10 Essential (primary) hypertension: Secondary | ICD-10-CM | POA: Diagnosis not present

## 2016-02-06 DIAGNOSIS — Z510 Encounter for antineoplastic radiation therapy: Secondary | ICD-10-CM | POA: Diagnosis not present

## 2016-02-06 DIAGNOSIS — C139 Malignant neoplasm of hypopharynx, unspecified: Secondary | ICD-10-CM | POA: Diagnosis not present

## 2016-02-06 DIAGNOSIS — Z85048 Personal history of other malignant neoplasm of rectum, rectosigmoid junction, and anus: Secondary | ICD-10-CM | POA: Diagnosis not present

## 2016-02-06 DIAGNOSIS — R634 Abnormal weight loss: Secondary | ICD-10-CM | POA: Diagnosis not present

## 2016-02-06 DIAGNOSIS — C12 Malignant neoplasm of pyriform sinus: Secondary | ICD-10-CM | POA: Diagnosis not present

## 2016-02-06 DIAGNOSIS — M1990 Unspecified osteoarthritis, unspecified site: Secondary | ICD-10-CM | POA: Diagnosis not present

## 2016-02-07 ENCOUNTER — Ambulatory Visit: Payer: Medicare Other

## 2016-02-07 ENCOUNTER — Inpatient Hospital Stay: Payer: Medicare Other

## 2016-02-07 ENCOUNTER — Ambulatory Visit
Admission: RE | Admit: 2016-02-07 | Discharge: 2016-02-07 | Disposition: A | Discharge status: Home / Self Care | Payer: Medicare Other | Source: Ambulatory Visit | Attending: Radiation Oncology | Admitting: Radiation Oncology

## 2016-02-07 VITALS — BP 113/70 | HR 64 | Temp 96.2°F | Resp 18

## 2016-02-07 DIAGNOSIS — R110 Nausea: Secondary | ICD-10-CM | POA: Diagnosis not present

## 2016-02-07 DIAGNOSIS — C139 Malignant neoplasm of hypopharynx, unspecified: Secondary | ICD-10-CM | POA: Diagnosis not present

## 2016-02-07 DIAGNOSIS — I10 Essential (primary) hypertension: Secondary | ICD-10-CM | POA: Diagnosis not present

## 2016-02-07 DIAGNOSIS — R21 Rash and other nonspecific skin eruption: Secondary | ICD-10-CM | POA: Diagnosis not present

## 2016-02-07 DIAGNOSIS — Z431 Encounter for attention to gastrostomy: Secondary | ICD-10-CM | POA: Diagnosis not present

## 2016-02-07 DIAGNOSIS — R630 Anorexia: Secondary | ICD-10-CM | POA: Diagnosis not present

## 2016-02-07 DIAGNOSIS — M1990 Unspecified osteoarthritis, unspecified site: Secondary | ICD-10-CM | POA: Diagnosis not present

## 2016-02-07 DIAGNOSIS — C138 Malignant neoplasm of overlapping sites of hypopharynx: Secondary | ICD-10-CM | POA: Diagnosis not present

## 2016-02-07 DIAGNOSIS — Z5111 Encounter for antineoplastic chemotherapy: Secondary | ICD-10-CM | POA: Diagnosis not present

## 2016-02-07 DIAGNOSIS — Z510 Encounter for antineoplastic radiation therapy: Secondary | ICD-10-CM | POA: Diagnosis not present

## 2016-02-07 DIAGNOSIS — C12 Malignant neoplasm of pyriform sinus: Secondary | ICD-10-CM | POA: Diagnosis not present

## 2016-02-07 DIAGNOSIS — Z85048 Personal history of other malignant neoplasm of rectum, rectosigmoid junction, and anus: Secondary | ICD-10-CM | POA: Diagnosis not present

## 2016-02-07 DIAGNOSIS — R634 Abnormal weight loss: Secondary | ICD-10-CM | POA: Diagnosis not present

## 2016-02-07 DIAGNOSIS — R1310 Dysphagia, unspecified: Secondary | ICD-10-CM | POA: Diagnosis not present

## 2016-02-07 MED ORDER — SODIUM CHLORIDE 0.9 % IV SOLN
Freq: Once | INTRAVENOUS | Status: AC
  Administered 2016-02-07: 12:00:00 via INTRAVENOUS
  Filled 2016-02-07: qty 2

## 2016-02-07 MED ORDER — SODIUM CHLORIDE 0.9 % IV SOLN
Freq: Once | INTRAVENOUS | Status: AC
  Administered 2016-02-07: 12:00:00 via INTRAVENOUS
  Filled 2016-02-07: qty 1000

## 2016-02-07 MED ORDER — HEPARIN SOD (PORK) LOCK FLUSH 100 UNIT/ML IV SOLN
500.0000 [IU] | Freq: Once | INTRAVENOUS | Status: AC
  Administered 2016-02-07: 500 [IU] via INTRAVENOUS
  Filled 2016-02-07: qty 5

## 2016-02-07 MED ORDER — SODIUM CHLORIDE 0.9% FLUSH
10.0000 mL | INTRAVENOUS | Status: DC | PRN
  Administered 2016-02-07: 10 mL via INTRAVENOUS
  Filled 2016-02-07: qty 10

## 2016-02-10 ENCOUNTER — Ambulatory Visit
Admission: RE | Admit: 2016-02-10 | Discharge: 2016-02-10 | Disposition: A | Discharge status: Home / Self Care | Payer: Medicare Other | Source: Ambulatory Visit | Attending: Radiation Oncology | Admitting: Radiation Oncology

## 2016-02-10 ENCOUNTER — Ambulatory Visit: Payer: Medicare Other

## 2016-02-10 DIAGNOSIS — C12 Malignant neoplasm of pyriform sinus: Secondary | ICD-10-CM | POA: Diagnosis not present

## 2016-02-10 DIAGNOSIS — Z510 Encounter for antineoplastic radiation therapy: Secondary | ICD-10-CM | POA: Diagnosis not present

## 2016-02-10 DIAGNOSIS — C139 Malignant neoplasm of hypopharynx, unspecified: Secondary | ICD-10-CM | POA: Diagnosis not present

## 2016-02-11 ENCOUNTER — Ambulatory Visit
Admission: RE | Admit: 2016-02-11 | Discharge: 2016-02-11 | Disposition: A | Discharge status: Home / Self Care | Payer: Medicare Other | Source: Ambulatory Visit | Attending: Radiation Oncology | Admitting: Radiation Oncology

## 2016-02-11 ENCOUNTER — Ambulatory Visit: Payer: Medicare Other

## 2016-02-11 DIAGNOSIS — Z510 Encounter for antineoplastic radiation therapy: Secondary | ICD-10-CM | POA: Diagnosis not present

## 2016-02-11 DIAGNOSIS — Z431 Encounter for attention to gastrostomy: Secondary | ICD-10-CM | POA: Diagnosis not present

## 2016-02-11 DIAGNOSIS — C12 Malignant neoplasm of pyriform sinus: Secondary | ICD-10-CM | POA: Diagnosis not present

## 2016-02-11 DIAGNOSIS — Z85048 Personal history of other malignant neoplasm of rectum, rectosigmoid junction, and anus: Secondary | ICD-10-CM | POA: Diagnosis not present

## 2016-02-11 DIAGNOSIS — I10 Essential (primary) hypertension: Secondary | ICD-10-CM | POA: Diagnosis not present

## 2016-02-11 DIAGNOSIS — C139 Malignant neoplasm of hypopharynx, unspecified: Secondary | ICD-10-CM | POA: Diagnosis not present

## 2016-02-11 DIAGNOSIS — M1990 Unspecified osteoarthritis, unspecified site: Secondary | ICD-10-CM | POA: Diagnosis not present

## 2016-02-11 DIAGNOSIS — R634 Abnormal weight loss: Secondary | ICD-10-CM | POA: Diagnosis not present

## 2016-02-12 ENCOUNTER — Inpatient Hospital Stay: Payer: Medicare Other

## 2016-02-12 ENCOUNTER — Other Ambulatory Visit: Payer: Self-pay | Admitting: Internal Medicine

## 2016-02-12 ENCOUNTER — Ambulatory Visit: Payer: Medicare Other

## 2016-02-12 ENCOUNTER — Ambulatory Visit
Admission: RE | Admit: 2016-02-12 | Discharge: 2016-02-12 | Disposition: A | Discharge status: Home / Self Care | Payer: Medicare Other | Source: Ambulatory Visit | Attending: Radiation Oncology | Admitting: Radiation Oncology

## 2016-02-12 ENCOUNTER — Ambulatory Visit: Payer: Self-pay | Admitting: Internal Medicine

## 2016-02-12 VITALS — BP 132/71 | HR 66 | Temp 97.0°F | Resp 18

## 2016-02-12 DIAGNOSIS — C139 Malignant neoplasm of hypopharynx, unspecified: Secondary | ICD-10-CM

## 2016-02-12 DIAGNOSIS — R1310 Dysphagia, unspecified: Secondary | ICD-10-CM | POA: Diagnosis not present

## 2016-02-12 DIAGNOSIS — Z510 Encounter for antineoplastic radiation therapy: Secondary | ICD-10-CM | POA: Diagnosis not present

## 2016-02-12 DIAGNOSIS — C12 Malignant neoplasm of pyriform sinus: Secondary | ICD-10-CM | POA: Diagnosis not present

## 2016-02-12 DIAGNOSIS — R630 Anorexia: Secondary | ICD-10-CM | POA: Diagnosis not present

## 2016-02-12 DIAGNOSIS — Z5111 Encounter for antineoplastic chemotherapy: Secondary | ICD-10-CM | POA: Diagnosis not present

## 2016-02-12 DIAGNOSIS — R110 Nausea: Secondary | ICD-10-CM | POA: Diagnosis not present

## 2016-02-12 DIAGNOSIS — R21 Rash and other nonspecific skin eruption: Secondary | ICD-10-CM | POA: Diagnosis not present

## 2016-02-12 DIAGNOSIS — C138 Malignant neoplasm of overlapping sites of hypopharynx: Secondary | ICD-10-CM | POA: Diagnosis not present

## 2016-02-12 LAB — CBC WITH DIFFERENTIAL/PLATELET
BASOS ABS: 0 10*3/uL (ref 0–0.1)
Basophils Relative: 1 %
EOS ABS: 0 10*3/uL (ref 0–0.7)
Eosinophils Relative: 1 %
HCT: 32.7 % — ABNORMAL LOW (ref 35.0–47.0)
HEMOGLOBIN: 11.2 g/dL — AB (ref 12.0–16.0)
LYMPHS ABS: 0.3 10*3/uL — AB (ref 1.0–3.6)
Lymphocytes Relative: 8 %
MCH: 32.4 pg (ref 26.0–34.0)
MCHC: 34.4 g/dL (ref 32.0–36.0)
MCV: 94.3 fL (ref 80.0–100.0)
Monocytes Absolute: 0.2 10*3/uL (ref 0.2–0.9)
Monocytes Relative: 6 %
NEUTROS PCT: 84 %
Neutro Abs: 3.2 10*3/uL (ref 1.4–6.5)
Platelets: 135 10*3/uL — ABNORMAL LOW (ref 150–440)
RBC: 3.47 MIL/uL — AB (ref 3.80–5.20)
RDW: 16 % — ABNORMAL HIGH (ref 11.5–14.5)
WBC: 3.8 10*3/uL (ref 3.6–11.0)

## 2016-02-12 LAB — COMPREHENSIVE METABOLIC PANEL
ALK PHOS: 57 U/L (ref 38–126)
ALT: 25 U/L (ref 14–54)
AST: 25 U/L (ref 15–41)
Albumin: 3.5 g/dL (ref 3.5–5.0)
Anion gap: 5 (ref 5–15)
BUN: 24 mg/dL — AB (ref 6–20)
CO2: 26 mmol/L (ref 22–32)
Calcium: 9.3 mg/dL (ref 8.9–10.3)
Chloride: 105 mmol/L (ref 101–111)
Creatinine, Ser: 1.08 mg/dL — ABNORMAL HIGH (ref 0.44–1.00)
GFR calc non Af Amer: 47 mL/min — ABNORMAL LOW (ref >60)
GFR, EST AFRICAN AMERICAN: 55 mL/min — AB (ref >60)
GLUCOSE: 121 mg/dL — AB (ref 65–99)
Potassium: 4.1 mmol/L (ref 3.5–5.1)
SODIUM: 136 mmol/L (ref 135–145)
TOTAL PROTEIN: 6.4 g/dL — AB (ref 6.5–8.1)
Total Bilirubin: 0.4 mg/dL (ref 0.3–1.2)

## 2016-02-12 LAB — MAGNESIUM: Magnesium: 2 mg/dL (ref 1.7–2.4)

## 2016-02-12 MED ORDER — DIPHENHYDRAMINE HCL 50 MG/ML IJ SOLN
25.0000 mg | Freq: Once | INTRAMUSCULAR | Status: AC
  Administered 2016-02-12: 25 mg via INTRAVENOUS
  Filled 2016-02-12: qty 1

## 2016-02-12 MED ORDER — SODIUM CHLORIDE 0.9 % IV SOLN
Freq: Once | INTRAVENOUS | Status: AC
  Administered 2016-02-12: 10:00:00 via INTRAVENOUS
  Filled 2016-02-12: qty 1000

## 2016-02-12 MED ORDER — FOSAPREPITANT DIMEGLUMINE INJECTION 150 MG
Freq: Once | INTRAVENOUS | Status: AC
  Administered 2016-02-12: 10:00:00 via INTRAVENOUS
  Filled 2016-02-12: qty 5

## 2016-02-12 MED ORDER — HEPARIN SOD (PORK) LOCK FLUSH 100 UNIT/ML IV SOLN
500.0000 [IU] | Freq: Once | INTRAVENOUS | Status: AC
  Administered 2016-02-12: 500 [IU] via INTRAVENOUS
  Filled 2016-02-12: qty 5

## 2016-02-12 MED ORDER — SODIUM CHLORIDE 0.9 % IV SOLN
Freq: Once | INTRAVENOUS | Status: AC
  Administered 2016-02-12: 09:00:00 via INTRAVENOUS
  Filled 2016-02-12: qty 1000

## 2016-02-12 MED ORDER — SODIUM CHLORIDE 0.9% FLUSH
10.0000 mL | Freq: Once | INTRAVENOUS | Status: AC
  Administered 2016-02-12: 10 mL via INTRAVENOUS
  Filled 2016-02-12: qty 10

## 2016-02-12 MED ORDER — CARBOPLATIN CHEMO INJECTION 450 MG/45ML
117.8000 mg | Freq: Once | INTRAVENOUS | Status: AC
  Administered 2016-02-12: 120 mg via INTRAVENOUS
  Filled 2016-02-12: qty 12

## 2016-02-12 MED ORDER — HEPARIN SOD (PORK) LOCK FLUSH 100 UNIT/ML IV SOLN: 500.0000 [IU] | Freq: Once | INTRAVENOUS | Status: DC | PRN

## 2016-02-13 ENCOUNTER — Ambulatory Visit
Admission: RE | Admit: 2016-02-13 | Discharge: 2016-02-13 | Disposition: A | Discharge status: Home / Self Care | Payer: Medicare Other | Source: Ambulatory Visit | Attending: Radiation Oncology | Admitting: Radiation Oncology

## 2016-02-13 ENCOUNTER — Other Ambulatory Visit: Payer: Self-pay

## 2016-02-13 ENCOUNTER — Ambulatory Visit: Payer: Medicare Other

## 2016-02-13 ENCOUNTER — Ambulatory Visit: Payer: Self-pay | Admitting: Oncology

## 2016-02-13 DIAGNOSIS — Z510 Encounter for antineoplastic radiation therapy: Secondary | ICD-10-CM | POA: Diagnosis not present

## 2016-02-13 DIAGNOSIS — C12 Malignant neoplasm of pyriform sinus: Secondary | ICD-10-CM | POA: Diagnosis not present

## 2016-02-13 DIAGNOSIS — C139 Malignant neoplasm of hypopharynx, unspecified: Secondary | ICD-10-CM | POA: Diagnosis not present

## 2016-02-14 ENCOUNTER — Ambulatory Visit
Admission: RE | Admit: 2016-02-14 | Discharge: 2016-02-14 | Disposition: A | Discharge status: Home / Self Care | Payer: Medicare Other | Source: Ambulatory Visit | Attending: Radiation Oncology | Admitting: Radiation Oncology

## 2016-02-14 ENCOUNTER — Ambulatory Visit: Payer: Medicare Other

## 2016-02-14 ENCOUNTER — Inpatient Hospital Stay: Payer: Medicare Other

## 2016-02-14 ENCOUNTER — Telehealth: Payer: Self-pay | Admitting: *Deleted

## 2016-02-14 VITALS — BP 135/74 | HR 68 | Temp 95.5°F | Resp 20

## 2016-02-14 DIAGNOSIS — C12 Malignant neoplasm of pyriform sinus: Secondary | ICD-10-CM | POA: Diagnosis not present

## 2016-02-14 DIAGNOSIS — R21 Rash and other nonspecific skin eruption: Secondary | ICD-10-CM | POA: Diagnosis not present

## 2016-02-14 DIAGNOSIS — Z5111 Encounter for antineoplastic chemotherapy: Secondary | ICD-10-CM | POA: Diagnosis not present

## 2016-02-14 DIAGNOSIS — C139 Malignant neoplasm of hypopharynx, unspecified: Secondary | ICD-10-CM | POA: Diagnosis not present

## 2016-02-14 DIAGNOSIS — R1310 Dysphagia, unspecified: Secondary | ICD-10-CM | POA: Diagnosis not present

## 2016-02-14 DIAGNOSIS — R110 Nausea: Secondary | ICD-10-CM | POA: Diagnosis not present

## 2016-02-14 DIAGNOSIS — Z510 Encounter for antineoplastic radiation therapy: Secondary | ICD-10-CM | POA: Diagnosis not present

## 2016-02-14 DIAGNOSIS — C138 Malignant neoplasm of overlapping sites of hypopharynx: Secondary | ICD-10-CM | POA: Diagnosis not present

## 2016-02-14 DIAGNOSIS — R630 Anorexia: Secondary | ICD-10-CM | POA: Diagnosis not present

## 2016-02-14 MED ORDER — HEPARIN SOD (PORK) LOCK FLUSH 100 UNIT/ML IV SOLN
500.0000 [IU] | Freq: Once | INTRAVENOUS | Status: AC
  Administered 2016-02-14: 500 [IU] via INTRAVENOUS
  Filled 2016-02-14: qty 5

## 2016-02-14 MED ORDER — SODIUM CHLORIDE 0.9 % IV SOLN
Freq: Once | INTRAVENOUS | Status: AC
  Administered 2016-02-14: 14:00:00 via INTRAVENOUS
  Filled 2016-02-14: qty 1000

## 2016-02-14 MED ORDER — SODIUM CHLORIDE 0.9% FLUSH
10.0000 mL | Freq: Once | INTRAVENOUS | Status: AC
  Administered 2016-02-14: 10 mL via INTRAVENOUS
  Filled 2016-02-14: qty 10

## 2016-02-14 MED ORDER — SODIUM CHLORIDE 0.9 % IV SOLN
Freq: Once | INTRAVENOUS | Status: AC
  Administered 2016-02-14: 14:00:00 via INTRAVENOUS
  Filled 2016-02-14: qty 2

## 2016-02-14 NOTE — Telephone Encounter (Signed)
Called to inform us that she made contact to go out and assess needs of patient, daughter answered phone and said "we don't need social work" and hung up on her

## 2016-02-17 ENCOUNTER — Other Ambulatory Visit: Payer: Self-pay | Admitting: Oncology

## 2016-02-17 ENCOUNTER — Inpatient Hospital Stay: Payer: Medicare Other

## 2016-02-17 ENCOUNTER — Ambulatory Visit
Admission: RE | Admit: 2016-02-17 | Discharge: 2016-02-17 | Disposition: A | Discharge status: Home / Self Care | Payer: Medicare Other | Source: Ambulatory Visit | Attending: Radiation Oncology | Admitting: Radiation Oncology

## 2016-02-17 ENCOUNTER — Telehealth: Payer: Self-pay | Admitting: *Deleted

## 2016-02-17 ENCOUNTER — Ambulatory Visit: Payer: Medicare Other

## 2016-02-17 DIAGNOSIS — C139 Malignant neoplasm of hypopharynx, unspecified: Secondary | ICD-10-CM | POA: Diagnosis not present

## 2016-02-17 DIAGNOSIS — E860 Dehydration: Secondary | ICD-10-CM

## 2016-02-17 DIAGNOSIS — Z510 Encounter for antineoplastic radiation therapy: Secondary | ICD-10-CM | POA: Diagnosis not present

## 2016-02-17 DIAGNOSIS — C12 Malignant neoplasm of pyriform sinus: Secondary | ICD-10-CM | POA: Diagnosis not present

## 2016-02-17 MED ORDER — SODIUM CHLORIDE 0.9 % IV SOLN
Freq: Once | INTRAVENOUS | Status: AC
  Administered 2017-07-06: 13:00:00 via INTRAVENOUS
  Filled 2016-02-17: qty 1000

## 2016-02-17 NOTE — Telephone Encounter (Signed)
Per Dr Grayland Ormond, she can get IVF tomorrow and force oral flds today as suggested. Order sent to scheduling

## 2016-02-17 NOTE — Telephone Encounter (Signed)
Received call from Dr Grayland Ormond stating he got page from answering service to call pt regarding son wanting her to get IVF today when she comes for her XRT, he asked that I call and get details. I was told that she is ready to stop tx, she is fatigued and has no energy which is why they want her to have IVF. Reports that they hardly use her PEG tube because she is eating and drinking fine. When I asked if they are giving her water through her tube. I asked how much water she was drinking a day  And if it is 1/2 to 1 gallon I was told nowhere near that, but she is drinking [employer] and sweet tea. I explained that that has a diuretic in it and that she needs to drink more water to keep her hydrated  There is no chair available today, but they can  possibly do tomorrow if MD agrees that she needs IVF

## 2016-02-18 ENCOUNTER — Ambulatory Visit: Payer: Medicare Other

## 2016-02-18 ENCOUNTER — Ambulatory Visit
Admission: RE | Admit: 2016-02-18 | Discharge: 2016-02-18 | Disposition: A | Discharge status: Home / Self Care | Payer: Medicare Other | Source: Ambulatory Visit | Attending: Radiation Oncology | Admitting: Radiation Oncology

## 2016-02-18 ENCOUNTER — Inpatient Hospital Stay: Payer: Medicare Other

## 2016-02-18 DIAGNOSIS — Z510 Encounter for antineoplastic radiation therapy: Secondary | ICD-10-CM | POA: Diagnosis not present

## 2016-02-18 DIAGNOSIS — C12 Malignant neoplasm of pyriform sinus: Secondary | ICD-10-CM | POA: Diagnosis not present

## 2016-02-18 DIAGNOSIS — C139 Malignant neoplasm of hypopharynx, unspecified: Secondary | ICD-10-CM | POA: Diagnosis not present

## 2016-02-19 ENCOUNTER — Ambulatory Visit: Payer: Medicare Other

## 2016-02-19 ENCOUNTER — Inpatient Hospital Stay: Payer: Medicare Other

## 2016-02-19 ENCOUNTER — Ambulatory Visit
Admission: RE | Admit: 2016-02-19 | Discharge: 2016-02-19 | Disposition: A | Discharge status: Home / Self Care | Payer: Medicare Other | Source: Ambulatory Visit | Attending: Radiation Oncology | Admitting: Radiation Oncology

## 2016-02-19 ENCOUNTER — Inpatient Hospital Stay (HOSPITAL_BASED_OUTPATIENT_CLINIC_OR_DEPARTMENT_OTHER): Payer: Medicare Other | Admitting: Oncology

## 2016-02-19 ENCOUNTER — Encounter: Payer: Self-pay | Admitting: Oncology

## 2016-02-19 VITALS — BP 159/75 | HR 75 | Temp 96.4°F | Resp 18 | Wt 109.2 lb

## 2016-02-19 DIAGNOSIS — C138 Malignant neoplasm of overlapping sites of hypopharynx: Secondary | ICD-10-CM

## 2016-02-19 DIAGNOSIS — R110 Nausea: Secondary | ICD-10-CM

## 2016-02-19 DIAGNOSIS — Z510 Encounter for antineoplastic radiation therapy: Secondary | ICD-10-CM | POA: Diagnosis not present

## 2016-02-19 DIAGNOSIS — R21 Rash and other nonspecific skin eruption: Secondary | ICD-10-CM | POA: Diagnosis not present

## 2016-02-19 DIAGNOSIS — Z79899 Other long term (current) drug therapy: Secondary | ICD-10-CM

## 2016-02-19 DIAGNOSIS — C139 Malignant neoplasm of hypopharynx, unspecified: Secondary | ICD-10-CM

## 2016-02-19 DIAGNOSIS — Z9221 Personal history of antineoplastic chemotherapy: Secondary | ICD-10-CM

## 2016-02-19 DIAGNOSIS — R1310 Dysphagia, unspecified: Secondary | ICD-10-CM

## 2016-02-19 DIAGNOSIS — R630 Anorexia: Secondary | ICD-10-CM | POA: Diagnosis not present

## 2016-02-19 DIAGNOSIS — Z923 Personal history of irradiation: Secondary | ICD-10-CM

## 2016-02-19 DIAGNOSIS — K5790 Diverticulosis of intestine, part unspecified, without perforation or abscess without bleeding: Secondary | ICD-10-CM

## 2016-02-19 DIAGNOSIS — M8580 Other specified disorders of bone density and structure, unspecified site: Secondary | ICD-10-CM

## 2016-02-19 DIAGNOSIS — Z7952 Long term (current) use of systemic steroids: Secondary | ICD-10-CM

## 2016-02-19 DIAGNOSIS — C12 Malignant neoplasm of pyriform sinus: Secondary | ICD-10-CM | POA: Diagnosis not present

## 2016-02-19 DIAGNOSIS — Z85048 Personal history of other malignant neoplasm of rectum, rectosigmoid junction, and anus: Secondary | ICD-10-CM

## 2016-02-19 DIAGNOSIS — F17210 Nicotine dependence, cigarettes, uncomplicated: Secondary | ICD-10-CM

## 2016-02-19 DIAGNOSIS — I10 Essential (primary) hypertension: Secondary | ICD-10-CM

## 2016-02-19 DIAGNOSIS — Z5111 Encounter for antineoplastic chemotherapy: Secondary | ICD-10-CM | POA: Diagnosis not present

## 2016-02-19 LAB — COMPREHENSIVE METABOLIC PANEL
ALBUMIN: 3.6 g/dL (ref 3.5–5.0)
ALK PHOS: 60 U/L (ref 38–126)
ALT: 18 U/L (ref 14–54)
AST: 18 U/L (ref 15–41)
Anion gap: 6 (ref 5–15)
BILIRUBIN TOTAL: 0.4 mg/dL (ref 0.3–1.2)
BUN: 15 mg/dL (ref 6–20)
CO2: 26 mmol/L (ref 22–32)
CREATININE: 0.97 mg/dL (ref 0.44–1.00)
Calcium: 8.6 mg/dL — ABNORMAL LOW (ref 8.9–10.3)
Chloride: 106 mmol/L (ref 101–111)
GFR calc Af Amer: >60 mL/min (ref >60)
GFR, EST NON AFRICAN AMERICAN: 54 mL/min — AB (ref >60)
GLUCOSE: 106 mg/dL — AB (ref 65–99)
Potassium: 3.7 mmol/L (ref 3.5–5.1)
Sodium: 138 mmol/L (ref 135–145)
TOTAL PROTEIN: 6.3 g/dL — AB (ref 6.5–8.1)

## 2016-02-19 LAB — MAGNESIUM: MAGNESIUM: 1.9 mg/dL (ref 1.7–2.4)

## 2016-02-19 LAB — CBC WITH DIFFERENTIAL/PLATELET
BASOS ABS: 0 10*3/uL (ref 0–0.1)
Basophils Relative: 0 %
Eosinophils Absolute: 0 10*3/uL (ref 0–0.7)
Eosinophils Relative: 1 %
HEMATOCRIT: 32.1 % — AB (ref 35.0–47.0)
HEMOGLOBIN: 10.9 g/dL — AB (ref 12.0–16.0)
LYMPHS PCT: 7 %
Lymphs Abs: 0.2 10*3/uL — ABNORMAL LOW (ref 1.0–3.6)
MCH: 31.9 pg (ref 26.0–34.0)
MCHC: 33.9 g/dL (ref 32.0–36.0)
MCV: 94.2 fL (ref 80.0–100.0)
MONO ABS: 0.4 10*3/uL (ref 0.2–0.9)
Monocytes Relative: 14 %
NEUTROS ABS: 2.2 10*3/uL (ref 1.4–6.5)
NEUTROS PCT: 78 %
Platelets: 189 10*3/uL (ref 150–440)
RBC: 3.41 MIL/uL — AB (ref 3.80–5.20)
RDW: 16.8 % — AB (ref 11.5–14.5)
WBC: 2.8 10*3/uL — ABNORMAL LOW (ref 3.6–11.0)

## 2016-02-19 MED ORDER — SODIUM CHLORIDE 0.9 % IV SOLN
123.2000 mg | Freq: Once | INTRAVENOUS | Status: AC
  Administered 2016-02-19: 120 mg via INTRAVENOUS
  Filled 2016-02-19: qty 12

## 2016-02-19 MED ORDER — SODIUM CHLORIDE 0.9 % IV SOLN
Freq: Once | INTRAVENOUS | Status: AC
  Administered 2016-02-19: 12:00:00 via INTRAVENOUS
  Filled 2016-02-19: qty 5

## 2016-02-19 MED ORDER — HEPARIN SOD (PORK) LOCK FLUSH 100 UNIT/ML IV SOLN
500.0000 [IU] | Freq: Once | INTRAVENOUS | Status: AC | PRN
  Administered 2016-02-19: 500 [IU]
  Filled 2016-02-19: qty 5

## 2016-02-19 MED ORDER — SODIUM CHLORIDE 0.9 % IV SOLN
Freq: Once | INTRAVENOUS | Status: AC
  Administered 2016-02-19: 12:00:00 via INTRAVENOUS
  Filled 2016-02-19: qty 1000

## 2016-02-19 MED ORDER — DIPHENHYDRAMINE HCL 50 MG/ML IJ SOLN
25.0000 mg | Freq: Once | INTRAMUSCULAR | Status: AC
  Administered 2016-02-19: 25 mg via INTRAVENOUS
  Filled 2016-02-19: qty 1

## 2016-02-19 NOTE — Progress Notes (Signed)
Patient thinks she had a UTI - burning on urination.  Patient having some nausea.  Has tried drinking cranberry juice for suspected UTI and it burned her throat.  Patient is having radiation to her throat.

## 2016-02-20 ENCOUNTER — Other Ambulatory Visit: Payer: Self-pay | Admitting: *Deleted

## 2016-02-20 ENCOUNTER — Ambulatory Visit: Payer: Medicare Other

## 2016-02-20 ENCOUNTER — Ambulatory Visit
Admission: RE | Admit: 2016-02-20 | Discharge: 2016-02-20 | Disposition: A | Discharge status: Home / Self Care | Payer: Medicare Other | Source: Ambulatory Visit | Attending: Radiation Oncology | Admitting: Radiation Oncology

## 2016-02-20 DIAGNOSIS — C139 Malignant neoplasm of hypopharynx, unspecified: Secondary | ICD-10-CM | POA: Diagnosis not present

## 2016-02-20 DIAGNOSIS — Z510 Encounter for antineoplastic radiation therapy: Secondary | ICD-10-CM | POA: Diagnosis not present

## 2016-02-20 DIAGNOSIS — C12 Malignant neoplasm of pyriform sinus: Secondary | ICD-10-CM | POA: Diagnosis not present

## 2016-02-21 ENCOUNTER — Ambulatory Visit
Admission: RE | Admit: 2016-02-21 | Discharge: 2016-02-21 | Disposition: A | Discharge status: Home / Self Care | Payer: Medicare Other | Source: Ambulatory Visit | Attending: Radiation Oncology | Admitting: Radiation Oncology

## 2016-02-21 ENCOUNTER — Other Ambulatory Visit: Payer: Self-pay | Admitting: Internal Medicine

## 2016-02-21 ENCOUNTER — Inpatient Hospital Stay: Payer: Medicare Other

## 2016-02-21 ENCOUNTER — Ambulatory Visit: Payer: Medicare Other

## 2016-02-21 VITALS — BP 155/77 | HR 65 | Temp 96.5°F | Resp 18

## 2016-02-21 DIAGNOSIS — C139 Malignant neoplasm of hypopharynx, unspecified: Secondary | ICD-10-CM | POA: Diagnosis not present

## 2016-02-21 DIAGNOSIS — R110 Nausea: Secondary | ICD-10-CM | POA: Diagnosis not present

## 2016-02-21 DIAGNOSIS — Z510 Encounter for antineoplastic radiation therapy: Secondary | ICD-10-CM | POA: Diagnosis not present

## 2016-02-21 DIAGNOSIS — C138 Malignant neoplasm of overlapping sites of hypopharynx: Secondary | ICD-10-CM | POA: Diagnosis not present

## 2016-02-21 DIAGNOSIS — C12 Malignant neoplasm of pyriform sinus: Secondary | ICD-10-CM | POA: Diagnosis not present

## 2016-02-21 DIAGNOSIS — L299 Pruritus, unspecified: Secondary | ICD-10-CM

## 2016-02-21 DIAGNOSIS — Z5111 Encounter for antineoplastic chemotherapy: Secondary | ICD-10-CM | POA: Diagnosis not present

## 2016-02-21 DIAGNOSIS — R630 Anorexia: Secondary | ICD-10-CM | POA: Diagnosis not present

## 2016-02-21 DIAGNOSIS — R1310 Dysphagia, unspecified: Secondary | ICD-10-CM | POA: Diagnosis not present

## 2016-02-21 DIAGNOSIS — R21 Rash and other nonspecific skin eruption: Secondary | ICD-10-CM | POA: Diagnosis not present

## 2016-02-21 MED ORDER — HEPARIN SOD (PORK) LOCK FLUSH 100 UNIT/ML IV SOLN
500.0000 [IU] | Freq: Once | INTRAVENOUS | Status: AC
  Administered 2016-02-21: 500 [IU] via INTRAVENOUS

## 2016-02-21 MED ORDER — SODIUM CHLORIDE 0.9 % IV SOLN
Freq: Once | INTRAVENOUS | Status: AC
  Administered 2016-02-21: 14:00:00 via INTRAVENOUS
  Filled 2016-02-21: qty 1000

## 2016-02-21 MED ORDER — SODIUM CHLORIDE 0.9 % IV SOLN
Freq: Once | INTRAVENOUS | Status: AC
  Administered 2016-02-21: 14:00:00 via INTRAVENOUS
  Filled 2016-02-21: qty 2

## 2016-02-21 MED ORDER — HEPARIN SOD (PORK) LOCK FLUSH 100 UNIT/ML IV SOLN
INTRAVENOUS | Status: AC
  Filled 2016-02-21: qty 5

## 2016-02-21 MED ORDER — DIPHENHYDRAMINE HCL 25 MG PO CAPS
25.0000 mg | ORAL_CAPSULE | Freq: Once | ORAL | Status: AC
  Administered 2016-02-21: 25 mg via ORAL
  Filled 2016-02-21: qty 1

## 2016-02-21 MED ORDER — DIPHENHYDRAMINE HCL 25 MG PO TABS: 25.0000 mg | ORAL_TABLET | Freq: Once | ORAL | Status: DC

## 2016-02-22 ENCOUNTER — Encounter: Payer: Self-pay | Admitting: Oncology

## 2016-02-22 NOTE — Progress Notes (Signed)
[place] @ [facility] Telephone:([phone]) [phone]  Fax:([phone]) [place]: Jun 03, 1933  MR#: [id number]  GNF#:[id number]  Patient Care Team: [name], MD as PCP - General (Family Medicine) [name], MD (General Surgery) [name], MD (Unknown Physician Specialty) [name], MD (Family Medicine) [name], MD (General Surgery)  CHIEF COMPLAINT:  Chief Complaint  Patient presents with  . Cancer of hypopharynx    Oncology History   rectal cancer T3, N1, M0 tumor on chemoradiation therapy Hospitalizations secondary to diarrhea(April, 2014) chemoradiation intrupted. C. difficile diarrhea. 2.Status post abdominal perineal resection pT3  pNO stage II  disease.  (As September, 2014) patient underwent surgery at [facility] 3.  Patient could not tolerate post operative chemotherapy FOLFOX     Rectal cancer ([place])   01/20/2013 Initial Diagnosis Rectal cancer   4.  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 Starting radiation therapy and chemotherapy from January 13, 2016.  Cis-platinum with radiation treatment. Patient  is somewhat depressed INTERVAL HISTORY: 82 year old lady who has been seen by me previously with a history of rectal cancer.  He sent had been a chronic smoker and claims that has recently stopped smoking.  Since last November patient has noticed a mass in the right side of the neck.  Was treated with antibiotics without much improvement patient also has noticed hoarseness of voice.  His last month or so patient has progressive weight loss (approximately 12 pounds since last evaluation) Recent did not tolerate first cycle of cis-platinum.  In my absence Dr. B decided to hold off  chemotherapy.  Patient received intravenous fluid resulting into the improvement of the condition.  Patient is continuing radiation therapy.  Here to discuss further options of therapy.  Feeling stronger.  According to family oral intake has been increased.  Patient is getting supplementary food through PEG tube  Patient had to take a break from the radiation therapy because of stomatitis.  He now has resumed radiation therapy needs intravenous fluid off and on.  Overall oral intake has been improving. Here for further follow-up and treatment consideration. GETTING  carboplatinum with radiation therapy  REVIEW OF SYSTEMS:   Gen. status: PATIENT  s feeling somewhat better after cisplatin was being held.  Feeling stronger after intravenous infusion.  HEENT: As described about has hoarseness of voice.  Palpable lymph node on the right side of the neck.  Difficulty in swallowing.  An ENT evaluation is described about.  Patient recently had hemoptysis and was in the emergency room Lungs: No cough no shortness of breath patient has stopped smoking very recently Cardiac: No chest pain GI: Increasing difficulty in swallowing.  Patient had a recent colonoscopy done by Dr. Charlean Sanfilippo and no evidence of malignancy was found Skin: No rash Lower extremity: No swelling Neurological system: No headache no dizziness Family is reported progressive dementia All other systems have been reviewed patient has been accompanied with her son and daughter-in-law PAST MEDICAL HISTORY: Past Medical History  Diagnosis Date  . Status post chemotherapy     colon cancer  . Status post radiation therapy     colon cancer 2013  . Hypertension   . Diverticulosis   . Hemorrhoids   . Osteopenia   .  History of chicken pox   . Arthritis     fingers  . Cancer ([place])     rectal  . Cancer ([place]) 2012    colon cancer  . Cancer of contiguous sites of hypopharynx ([place]) 12/23/2015  . Difficult intubation   . Colon cancer ([place])      PAST SURGICAL HISTORY: Past Surgical History  Procedure Laterality Date  . Appendectomy    . Abdominal hysterectomy    . Tonsillectomy    . Eus N/A 01/20/2013    Procedure: LOWER ENDOSCOPIC ULTRASOUND (EUS);  Surgeon: Milus Banister, MD;  Location: [facility] ENDOSCOPY;  Service: Endoscopy;  Laterality: N/A;  . Colonoscopy    . Colon surgery  06/28/2013    rese[facility] tumor [facility] colon; [name]  . Appendectomy  [id number]    Dr. Pat Patrick  . Abdominal hysterectomy  1980    partial  . Dilation and curettage of uterus  1957  . Colonoscopy N/A 10/01/2015    Procedure: COLONOSCOPY;  Surgeon: [name], MD;  Location: [facility] ENDOSCOPY;  Service: Endoscopy;  Laterality: N/A;  . Direct laryngoscopy N/A 12/11/2015    Procedure: DIRECT LARYNGOSCOPY;  Surgeon: Clyde Canterbury, MD;  Location: [facility] ORS;  Service: ENT;  Laterality: N/A;  . Esophagoscopy N/A 12/11/2015    Procedure: ESOPHAGOSCOPY;  Surgeon: Clyde Canterbury, MD;  Location: [facility] ORS;  Service: ENT;  Laterality: N/A;  . Breast surgery      cyst removal  . Breast cyst aspiration Bilateral     negative  . Breast surgery  1960    Breast Biopsy  . Portacath placement Right 01/01/2016    Procedure: INSERTION PORT-A-CATH;  Surgeon: [name], MD;  Location: [facility] ORS;  Service: General;  Laterality: Right;  . Peg placement N/A 01/01/2016    Procedure: PERCUTANEOUS ENDOSCOPIC GASTROSTOMY (PEG) PLACEMENT;  Surgeon: [name], MD;  Location: [facility] ORS;  Service: General;  Laterality: N/A;    FAMILY HISTORY Family History  Problem Relation Age of Onset  . Breast cancer Maternal Grandmother   . Kidney failure Father     ADVANCED DIRECTIVES:  Patient does have advance healthcare directive, Patient   does not desire to make any changes  HEALTH MAINTENANCE: Social History  Substance Use Topics  . Smoking status: Current Every Day Smoker -- 1.00 packs/day for 64 years    Types: Cigarettes  . Smokeless tobacco: Never  Used  . Alcohol Use: 0.0 oz/week    0 Standard drinks or equivalent per week     Comment: rarely      Allergies  Allergen Reactions  . Amlodipine Besylate Other (See Comments)    Patient unaware of any allergy with this medicine  . Oxycodone     confusion    Current Outpatient Prescriptions  Medication Sig Dispense Refill  . HYDROcodone-acetaminophen (HYCET) 7.5-325 mg/15 ml solution 10-15 cc every 4-6 hours as needed for pain 300 mL 0  . lidocaine-prilocaine (EMLA) cream Apply 1 application topically as needed. 30 g 0  . metoprolol succinate (TOPROL-XL) 25 MG 24 hr tablet TAKE ONE-HALF TABLET BY MOUTH ONCE DAILY* NEEDS OFFICE VISIT* 90 tablet 3  . Nutritional Supplements (ENSURE PLUS PO) Take by mouth.    . Nutritional Supplements (FEEDING SUPPLEMENT, JEVITY 1.5 CAL,) LIQD Take 1108m jevity via PEG tube four times a day. Then flush tube with 55mwater after each feeding. 7500 mL 6  . nystatin (MYCOSTATIN) 100000 UNIT/ML suspension Reported on 12/30/2015  0  . ondansetron (  ZOFRAN) 4 MG tablet Take 1 tablet (4 mg total) by mouth every 6 (six) hours as needed. 30 tablet 3  . potassium chloride 20 MEQ/15ML (10%) SOLN Take 15 mLs (20 mEq total) by mouth daily. X 1week. 450 mL 0  . predniSONE (DELTASONE) 20 MG tablet Take 1 tablet (20 mg total) by mouth 2 (two) times daily with a meal. 15 tablet 0   No current facility-administered medications for this visit.   Facility-Administered Medications Ordered in Other Visits  Medication Dose Route Frequency Provider Last Rate Last Dose  . 0.9 %  sodium chloride infusion   Intravenous Once Mayra Reel, NP        OBJECTIVE:  Filed Vitals:   02/19/16 1109  BP: 159/75  Pulse: 75  Temp: 96.4 F (35.8 C)  Resp: 18     Body mass index is 18.17 kg/(m^2).    ECOG FS:1 - Symptomatic but completely ambulatory  PHYSICAL EXAM: Gneral  status: Patient has   STABLE  performance status Patient  somewhat depressed. HEENT: No evidence of  stomatitis. Sclera and conjunctivae :: No jaundice.   pale looking. Lungs: Air  entry equal on both sides.  No rhonchi.  No rales.  Cardiac: Heart sounds are normal.  No pericardial rub.  No murmur. Lymphatic system: palpable  cervical lymph node approximately 2 cm.  No other lymphadenopathy GI: Abdomen is soft.  No ascites.  Liver spleen not palpable.  No tenderness.  Bowel sounds are within normal limit Lower extremity: No edema Neurological system: Higher functions, cranial nerves intact no evidence of peripheral neuropathy. Skin: No rash.  No ecchymosis.Marland Kitchen  Extensive tube site is clear.  Port site is within normal limit. LAB RESULTS:  Infusion on 02/19/2016  Component Date Value Ref Range Status  . WBC 02/19/2016 2.8* 3.6 - 11.0 K/uL Final  . RBC 02/19/2016 3.41* 3.80 - 5.20 MIL/uL Final  . Hemoglobin 02/19/2016 10.9* 12.0 - 16.0 g/dL Final  . HCT 02/19/2016 32.1* 35.0 - 47.0 % Final  . MCV 02/19/2016 94.2  80.0 - 100.0 fL Final  . MCH 02/19/2016 31.9  26.0 - 34.0 pg Final  . MCHC 02/19/2016 33.9  32.0 - 36.0 g/dL Final  . RDW 02/19/2016 16.8* 11.5 - 14.5 % Final  . Platelets 02/19/2016 189  150 - 440 K/uL Final  . Neutrophils Relative % 02/19/2016 78   Final  . Neutro Abs 02/19/2016 2.2  1.4 - 6.5 K/uL Final  . Lymphocytes Relative 02/19/2016 7   Final  . Lymphs Abs 02/19/2016 0.2* 1.0 - 3.6 K/uL Final  . Monocytes Relative 02/19/2016 14   Final  . Monocytes Absolute 02/19/2016 0.4  0.2 - 0.9 K/uL Final  . Eosinophils Relative 02/19/2016 1   Final  . Eosinophils Absolute 02/19/2016 0.0  0 - 0.7 K/uL Final  . Basophils Relative 02/19/2016 0   Final  . Basophils Absolute 02/19/2016 0.0  0 - 0.1 K/uL Final  . Sodium 02/19/2016 138  135 - 145 mmol/L Final   LYTES REPEATED  . Potassium 02/19/2016 3.7  3.5 - 5.1 mmol/L Final  . Chloride 02/19/2016 106  101 - 111 mmol/L Final  . CO2 02/19/2016 26  22 - 32 mmol/L Final  . Glucose, Bld 02/19/2016 106* 65 - 99 mg/dL Final  . BUN  02/19/2016 15  6 - 20 mg/dL Final  . Creatinine, Ser 02/19/2016 0.97  0.44 - 1.00 mg/dL Final  . Calcium 02/19/2016 8.6* 8.9 - 10.3 mg/dL Final  . Total  Protein 02/19/2016 6.3* 6.5 - 8.1 g/dL Final  . Albumin 02/19/2016 3.6  3.5 - 5.0 g/dL Final  . AST 02/19/2016 18  15 - 41 U/L Final  . ALT 02/19/2016 18  14 - 54 U/L Final  . Alkaline Phosphatase 02/19/2016 60  38 - 126 U/L Final  . Total Bilirubin 02/19/2016 0.4  0.3 - 1.2 mg/dL Final  . GFR calc non Af Amer 02/19/2016 54* >60 mL/min Final  . GFR calc Af Amer 02/19/2016 >60  >60 mL/min Final   Comment: (NOTE) The eGFR has been calculated using the CKD EPI equation. This calculation has not been validated in all clinical situations. eGFR's persistently <60 mL/min signify possible Chronic Kidney Disease.   . Anion gap 02/19/2016 6  5 - 15 Final  . Magnesium 02/19/2016 1.9  1.7 - 2.4 mg/dL Final      STUDIES: No results found.  ASSESSMENT: 1.  Carcinoma of hypopharynx extending to multiple sites with vocal cord being immobile (pyriform sinus tumor extending to the vocal cord and lateral pharyngeal wall) T3 N1 M0 tumor stage III Poor tolerance to cis-platinum chemotherapy  Overall patient has a poor tolerance to chemotherapy even in the past with rectal cancer Tolerating radiation therapy very well Continue carboplatinum along with radiation therapy  No matching staging information was found for the patient.  [name], MD   02/22/2016 8:00 AM

## 2016-02-24 ENCOUNTER — Inpatient Hospital Stay: Payer: Medicare Other

## 2016-02-24 ENCOUNTER — Ambulatory Visit
Admission: RE | Admit: 2016-02-24 | Discharge: 2016-02-24 | Disposition: A | Discharge status: Home / Self Care | Payer: Medicare Other | Source: Ambulatory Visit | Attending: Radiation Oncology | Admitting: Radiation Oncology

## 2016-02-24 ENCOUNTER — Ambulatory Visit: Payer: Medicare Other

## 2016-02-24 DIAGNOSIS — C139 Malignant neoplasm of hypopharynx, unspecified: Secondary | ICD-10-CM | POA: Diagnosis not present

## 2016-02-24 DIAGNOSIS — Z510 Encounter for antineoplastic radiation therapy: Secondary | ICD-10-CM | POA: Diagnosis not present

## 2016-02-24 DIAGNOSIS — C12 Malignant neoplasm of pyriform sinus: Secondary | ICD-10-CM | POA: Diagnosis not present

## 2016-02-25 ENCOUNTER — Ambulatory Visit
Admission: RE | Admit: 2016-02-25 | Discharge: 2016-02-25 | Disposition: A | Discharge status: Home / Self Care | Payer: Medicare Other | Source: Ambulatory Visit | Attending: Radiation Oncology | Admitting: Radiation Oncology

## 2016-02-25 ENCOUNTER — Ambulatory Visit: Payer: Medicare Other

## 2016-02-25 DIAGNOSIS — C12 Malignant neoplasm of pyriform sinus: Secondary | ICD-10-CM | POA: Diagnosis not present

## 2016-02-25 DIAGNOSIS — Z510 Encounter for antineoplastic radiation therapy: Secondary | ICD-10-CM | POA: Diagnosis not present

## 2016-02-25 DIAGNOSIS — C139 Malignant neoplasm of hypopharynx, unspecified: Secondary | ICD-10-CM | POA: Diagnosis not present

## 2016-02-26 ENCOUNTER — Ambulatory Visit: Payer: Medicare Other

## 2016-02-26 ENCOUNTER — Ambulatory Visit
Admission: RE | Admit: 2016-02-26 | Discharge: 2016-02-26 | Disposition: A | Discharge status: Home / Self Care | Payer: Medicare Other | Source: Ambulatory Visit | Attending: Radiation Oncology | Admitting: Radiation Oncology

## 2016-02-26 DIAGNOSIS — C139 Malignant neoplasm of hypopharynx, unspecified: Secondary | ICD-10-CM | POA: Diagnosis not present

## 2016-02-26 DIAGNOSIS — Z510 Encounter for antineoplastic radiation therapy: Secondary | ICD-10-CM | POA: Diagnosis not present

## 2016-02-26 DIAGNOSIS — C12 Malignant neoplasm of pyriform sinus: Secondary | ICD-10-CM | POA: Diagnosis not present

## 2016-02-27 ENCOUNTER — Ambulatory Visit
Admission: RE | Admit: 2016-02-27 | Discharge: 2016-02-27 | Disposition: A | Discharge status: Home / Self Care | Payer: Medicare Other | Source: Ambulatory Visit | Attending: Radiation Oncology | Admitting: Radiation Oncology

## 2016-02-27 ENCOUNTER — Ambulatory Visit: Payer: Medicare Other

## 2016-02-27 DIAGNOSIS — C12 Malignant neoplasm of pyriform sinus: Secondary | ICD-10-CM | POA: Diagnosis not present

## 2016-02-27 DIAGNOSIS — C139 Malignant neoplasm of hypopharynx, unspecified: Secondary | ICD-10-CM | POA: Diagnosis not present

## 2016-02-27 DIAGNOSIS — Z510 Encounter for antineoplastic radiation therapy: Secondary | ICD-10-CM | POA: Diagnosis not present

## 2016-02-28 ENCOUNTER — Ambulatory Visit: Payer: Medicare Other

## 2016-03-02 ENCOUNTER — Ambulatory Visit
Admission: RE | Admit: 2016-03-02 | Discharge: 2016-03-02 | Disposition: A | Discharge status: Home / Self Care | Payer: Medicare Other | Source: Ambulatory Visit | Attending: Radiation Oncology | Admitting: Radiation Oncology

## 2016-03-02 ENCOUNTER — Ambulatory Visit: Payer: Medicare Other

## 2016-03-02 DIAGNOSIS — C139 Malignant neoplasm of hypopharynx, unspecified: Secondary | ICD-10-CM | POA: Diagnosis not present

## 2016-03-02 DIAGNOSIS — Z510 Encounter for antineoplastic radiation therapy: Secondary | ICD-10-CM | POA: Diagnosis not present

## 2016-03-02 DIAGNOSIS — C12 Malignant neoplasm of pyriform sinus: Secondary | ICD-10-CM | POA: Diagnosis not present

## 2016-03-03 ENCOUNTER — Ambulatory Visit
Admission: RE | Admit: 2016-03-03 | Discharge: 2016-03-03 | Disposition: A | Discharge status: Home / Self Care | Payer: Medicare Other | Source: Ambulatory Visit | Attending: Radiation Oncology | Admitting: Radiation Oncology

## 2016-03-03 ENCOUNTER — Ambulatory Visit: Payer: Medicare Other

## 2016-03-03 DIAGNOSIS — C139 Malignant neoplasm of hypopharynx, unspecified: Secondary | ICD-10-CM | POA: Diagnosis not present

## 2016-03-03 DIAGNOSIS — Z510 Encounter for antineoplastic radiation therapy: Secondary | ICD-10-CM | POA: Diagnosis not present

## 2016-03-03 DIAGNOSIS — C12 Malignant neoplasm of pyriform sinus: Secondary | ICD-10-CM | POA: Diagnosis not present

## 2016-03-04 ENCOUNTER — Ambulatory Visit: Payer: Self-pay | Admitting: Oncology

## 2016-03-04 ENCOUNTER — Other Ambulatory Visit: Payer: Self-pay

## 2016-03-18 ENCOUNTER — Inpatient Hospital Stay (HOSPITAL_BASED_OUTPATIENT_CLINIC_OR_DEPARTMENT_OTHER): Payer: Medicare Other | Admitting: Oncology

## 2016-03-18 ENCOUNTER — Inpatient Hospital Stay: Payer: Medicare Other | Attending: Oncology

## 2016-03-18 VITALS — BP 112/75 | HR 96 | Temp 96.0°F | Resp 18 | Wt 105.5 lb

## 2016-03-18 DIAGNOSIS — I10 Essential (primary) hypertension: Secondary | ICD-10-CM | POA: Insufficient documentation

## 2016-03-18 DIAGNOSIS — Z85048 Personal history of other malignant neoplasm of rectum, rectosigmoid junction, and anus: Secondary | ICD-10-CM | POA: Insufficient documentation

## 2016-03-18 DIAGNOSIS — K5790 Diverticulosis of intestine, part unspecified, without perforation or abscess without bleeding: Secondary | ICD-10-CM

## 2016-03-18 DIAGNOSIS — M129 Arthropathy, unspecified: Secondary | ICD-10-CM | POA: Diagnosis not present

## 2016-03-18 DIAGNOSIS — Z9221 Personal history of antineoplastic chemotherapy: Secondary | ICD-10-CM | POA: Insufficient documentation

## 2016-03-18 DIAGNOSIS — C139 Malignant neoplasm of hypopharynx, unspecified: Secondary | ICD-10-CM

## 2016-03-18 DIAGNOSIS — Z923 Personal history of irradiation: Secondary | ICD-10-CM | POA: Diagnosis not present

## 2016-03-18 DIAGNOSIS — M818 Other osteoporosis without current pathological fracture: Secondary | ICD-10-CM

## 2016-03-18 DIAGNOSIS — R634 Abnormal weight loss: Secondary | ICD-10-CM | POA: Insufficient documentation

## 2016-03-18 DIAGNOSIS — F17210 Nicotine dependence, cigarettes, uncomplicated: Secondary | ICD-10-CM | POA: Diagnosis not present

## 2016-03-18 LAB — COMPREHENSIVE METABOLIC PANEL
ALBUMIN: 3.8 g/dL (ref 3.5–5.0)
ALK PHOS: 64 U/L (ref 38–126)
ALT: 14 U/L (ref 14–54)
AST: 27 U/L (ref 15–41)
Anion gap: 6 (ref 5–15)
BUN: 19 mg/dL (ref 6–20)
CALCIUM: 9.8 mg/dL (ref 8.9–10.3)
CO2: 28 mmol/L (ref 22–32)
CREATININE: 1.13 mg/dL — AB (ref 0.44–1.00)
Chloride: 103 mmol/L (ref 101–111)
GFR calc Af Amer: 52 mL/min — ABNORMAL LOW (ref >60)
GFR calc non Af Amer: 45 mL/min — ABNORMAL LOW (ref >60)
GLUCOSE: 165 mg/dL — AB (ref 65–99)
Potassium: 3.6 mmol/L (ref 3.5–5.1)
SODIUM: 137 mmol/L (ref 135–145)
Total Bilirubin: 0.4 mg/dL (ref 0.3–1.2)
Total Protein: 6.8 g/dL (ref 6.5–8.1)

## 2016-03-18 LAB — CBC WITH DIFFERENTIAL/PLATELET
BASOS PCT: 1 %
Basophils Absolute: 0 10*3/uL (ref 0–0.1)
EOS ABS: 0 10*3/uL (ref 0–0.7)
EOS PCT: 0 %
HCT: 35.9 % (ref 35.0–47.0)
HEMOGLOBIN: 11.9 g/dL — AB (ref 12.0–16.0)
LYMPHS ABS: 0.4 10*3/uL — AB (ref 1.0–3.6)
LYMPHS PCT: 14 %
MCH: 31.5 pg (ref 26.0–34.0)
MCHC: 33.3 g/dL (ref 32.0–36.0)
MCV: 94.6 fL (ref 80.0–100.0)
Monocytes Absolute: 0.5 10*3/uL (ref 0.2–0.9)
Monocytes Relative: 19 %
NEUTROS PCT: 66 %
Neutro Abs: 1.7 10*3/uL (ref 1.4–6.5)
Platelets: 263 10*3/uL (ref 150–440)
RBC: 3.79 MIL/uL — AB (ref 3.80–5.20)
RDW: 19.4 % — ABNORMAL HIGH (ref 11.5–14.5)
WBC: 2.6 10*3/uL — AB (ref 3.6–11.0)

## 2016-03-18 LAB — MAGNESIUM: MAGNESIUM: 1.9 mg/dL (ref 1.7–2.4)

## 2016-03-18 NOTE — Progress Notes (Signed)
Patient states her ribs are sore under where her feeding tube is placed.  Patient accompanied by son who wants to discuss patient's nutrition at this visit.

## 2016-03-23 ENCOUNTER — Encounter: Payer: Self-pay | Admitting: Oncology

## 2016-03-23 NOTE — Progress Notes (Signed)
[place] @ [facility] Telephone:(336) [phone]  Fax:(336) [place]: August 01, 1933  MR#: [id number]  KLK#:[id number]  Patient Care Team: [name], MD as PCP - General (Family Medicine) [name], MD (General Surgery) [name], MD (Unknown Physician Specialty) [name], MD (Family Medicine) [name], MD (General Surgery)  CHIEF COMPLAINT:  Chief Complaint  Patient presents with  . Cancer of hypopharynx    Oncology History   rectal cancer T3, N1, M0 tumor on chemoradiation therapy Hospitalizations secondary to diarrhea(April, 2014) chemoradiation intrupted. C. difficile diarrhea. 2.Status post abdominal perineal resection pT3  pNO stage II  disease.  (As September, 2014) patient underwent surgery at [facility] 3.  Patient could not tolerate post operative chemotherapy FOLFOX     Rectal cancer ([place])   01/20/2013 Initial Diagnosis Rectal cancer   4.  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 Starting radiation therapy and chemotherapy from January 13, 2016.  Cis-platinum with radiation treatment. 6.Patient could not tolerate cis-platinum chemotherapy has been changed to carboplatinum. Finished carboplatinum and radiation therapy in   END   of April of 2017 INTERVAL HISTORY: 82 year old lady who has been seen by me previously with a history of rectal cancer.  He sent had been a chronic smoker and claims that has recently stopped smoking.    She has finished radiation and chemotherapy.  Mass in the right side of the neck has resolved.  Overall swallowing is improved.  The patient's oral intake remains poor.  [name] is extremely concerned about her losing weight and according  to him patient refuses to feed through PEG TUBE even though she was adequately trained to do so.  According to patient as he is afraid to feed through PEG tube. Family is concerned and desires to port RD last sister living facility but recent absolutely refuses to go to assisted living facility. Here for further follow-up and treatment consideration      Patient states her ribs are sore under where her feeding tube is placed. Patient accompanied by son who wants to discuss patient's nutrition at this visit.   REVIEW OF SYSTEMS:   Gen. status: PATIENT  s feeling somewhat better after cisplatin was being held.  Feeling stronger after intravenous infusion.  HEENT: As described about has hoarseness of voice.  Palpable lymph node on the right side of the neck.  Difficulty in swallowing.  An ENT evaluation is described about.  Patient recently had hemoptysis and was in the emergency room Lungs: No cough no shortness of breath patient has stopped smoking very recently Cardiac: No chest pain GI: Increasing difficulty in swallowing.  Patient had a recent colonoscopy done by Dr. Charlean Sanfilippo and no evidence of malignancy was found Skin: No rash Lower extremity: No swelling Neurological system: No headache no dizziness Family is reported progressive dementia All other systems have been reviewed patient has been accompanied with her son and daughter-in-law PAST MEDICAL HISTORY: Past Medical History  Diagnosis Date  . Status post chemotherapy     colon cancer  . Status post radiation therapy     colon cancer 2013  . Hypertension   . Diverticulosis   . Hemorrhoids   . Osteopenia   . History  of chicken pox   . Arthritis     fingers  . Cancer ([place])     rectal  . Cancer ([place]) 2012    colon cancer  . Cancer of contiguous sites of hypopharynx ([place]) 12/23/2015  . Difficult intubation   . Colon cancer ([place])     PAST SURGICAL HISTORY: Past Surgical History  Procedure Laterality Date  . Appendectomy     . Abdominal hysterectomy    . Tonsillectomy    . Eus N/A 01/20/2013    Procedure: LOWER ENDOSCOPIC ULTRASOUND (EUS);  Surgeon: Milus Banister, MD;  Location: [facility];  Service: Endoscopy;  Laterality: N/A;  . Colonoscopy    . Colon surgery  06/28/2013    resected tumor from colon; [facility]  . Appendectomy  [id number]    Dr. Pat Patrick  . Abdominal hysterectomy  1980    partial  . Dilation and curettage of uterus  1957  . Colonoscopy N/A 10/01/2015    Procedure: COLONOSCOPY;  Surgeon: [name], MD;  Location: [facility] ENDOSCOPY;  Service: Endoscopy;  Laterality: N/A;  . Direct laryngoscopy N/A 12/11/2015    Procedure: DIRECT LARYNGOSCOPY;  Surgeon: Clyde Canterbury, MD;  Location: [facility] ORS;  Service: ENT;  Laterality: N/A;  . Esophagoscopy N/A 12/11/2015    Procedure: ESOPHAGOSCOPY;  Surgeon: Clyde Canterbury, MD;  Location: [facility] ORS;  Service: ENT;  Laterality: N/A;  . Breast surgery      cyst removal  . Breast cyst aspiration Bilateral     negative  . Breast surgery  1960    Breast Biopsy  . Portacath placement Right 01/01/2016    Procedure: INSERTION PORT-A-CATH;  Surgeon: [name], MD;  Location: [facility] ORS;  Service: General;  Laterality: Right;  . Peg placement N/A 01/01/2016    Procedure: PERCUTANEOUS ENDOSCOPIC GASTROSTOMY (PEG) PLACEMENT;  Surgeon: [name], MD;  Location: [facility] ORS;  Service: General;  Laterality: N/A;    FAMILY HISTORY Family History  Problem Relation Age of Onset  . Breast cancer Maternal Grandmother   . Kidney failure Father     ADVANCED DIRECTIVES:  Patient does have advance healthcare directive, Patient   does not desire to make any changes  HEALTH MAINTENANCE: Social History  Substance Use Topics  . Smoking status: Current Every Day Smoker -- 1.00 packs/day for 64 years    Types: Cigarettes  . Smokeless tobacco: Never Used  . Alcohol Use: 0.0 oz/week    0 Standard drinks or equivalent per week     Comment:  rarely      Allergies  Allergen Reactions  . Amlodipine Besylate Other (See Comments)    Patient unaware of any allergy with this medicine  . Oxycodone     confusion    Current Outpatient Prescriptions  Medication Sig Dispense Refill  . HYDROcodone-acetaminophen (HYCET) 7.5-325 mg/15 ml solution 10-15 cc every 4-6 hours as needed for pain 300 mL 0  . metoprolol succinate (TOPROL-XL) 25 MG 24 hr tablet TAKE ONE-HALF TABLET BY MOUTH ONCE DAILY* NEEDS OFFICE VISIT* 90 tablet 3  . Nutritional Supplements (ENSURE PLUS PO) Take by mouth.     No current facility-administered medications for this visit.   Facility-Administered Medications Ordered in Other Visits  Medication Dose Route Frequency Provider Last Rate Last Dose  . 0.9 %  sodium chloride infusion   Intravenous Once Mayra Reel, NP        OBJECTIVE:  Filed Vitals:   03/18/16 1532  BP: 112/75  Pulse:  96  Temp: 96 F (35.6 C)  Resp: 18     Body mass index is 17.56 kg/(m^2).    ECOG FS:1 - Symptomatic but completely ambulatory  PHYSICAL EXAM: Gneral  status: Patient has   STABLE  performance status Patient  somewhat depressed. HEENT: No evidence of stomatitis. Sclera and conjunctivae :: No jaundice.   pale looking. Lymph node on the RIGHT   side of the ache has decreased in size Lungs: Air  entry equal on both sides.  No rhonchi.  No rales.  Cardiac: Heart sounds are normal.  No pericardial rub.  No murmur. Lymphatic system: palpable  cervical lymph node not palpable on the RIGHT side of the neck. GI: Abdomen is soft.  No ascites.  Liver spleen not palpable.  No tenderness.  Bowel sounds are within normal limit Lower extremity: No edema. EXAMINATION  of PEG tube site looks within normal limit.  There is no evidence of redness or infection. Neurological system: Higher functions, cranial nerves intact no evidence of peripheral neuropathy. Skin: No rash.  No ecchymosis.Marland Kitchen  Extensive tube site is clear.  Port site is  within normal limit. LAB RESULTS:  Appointment on 03/18/2016  Component Date Value Ref Range Status  . WBC 03/18/2016 2.6* 3.6 - 11.0 K/uL Final  . RBC 03/18/2016 3.79* 3.80 - 5.20 MIL/uL Final  . Hemoglobin 03/18/2016 11.9* 12.0 - 16.0 g/dL Final  . HCT 03/18/2016 35.9  35.0 - 47.0 % Final  . MCV 03/18/2016 94.6  80.0 - 100.0 fL Final  . MCH 03/18/2016 31.5  26.0 - 34.0 pg Final  . MCHC 03/18/2016 33.3  32.0 - 36.0 g/dL Final  . RDW 03/18/2016 19.4* 11.5 - 14.5 % Final  . Platelets 03/18/2016 263  150 - 440 K/uL Final  . Neutrophils Relative % 03/18/2016 66   Final  . Neutro Abs 03/18/2016 1.7  1.4 - 6.5 K/uL Final  . Lymphocytes Relative 03/18/2016 14   Final  . Lymphs Abs 03/18/2016 0.4* 1.0 - 3.6 K/uL Final  . Monocytes Relative 03/18/2016 19   Final  . Monocytes Absolute 03/18/2016 0.5  0.2 - 0.9 K/uL Final  . Eosinophils Relative 03/18/2016 0   Final  . Eosinophils Absolute 03/18/2016 0.0  0 - 0.7 K/uL Final  . Basophils Relative 03/18/2016 1   Final  . Basophils Absolute 03/18/2016 0.0  0 - 0.1 K/uL Final  . Sodium 03/18/2016 137  135 - 145 mmol/L Final  . Potassium 03/18/2016 3.6  3.5 - 5.1 mmol/L Final  . Chloride 03/18/2016 103  101 - 111 mmol/L Final  . CO2 03/18/2016 28  22 - 32 mmol/L Final  . Glucose, Bld 03/18/2016 165* 65 - 99 mg/dL Final  . BUN 03/18/2016 19  6 - 20 mg/dL Final  . Creatinine, Ser 03/18/2016 1.13* 0.44 - 1.00 mg/dL Final  . Calcium 03/18/2016 9.8  8.9 - 10.3 mg/dL Final  . Total Protein 03/18/2016 6.8  6.5 - 8.1 g/dL Final  . Albumin 03/18/2016 3.8  3.5 - 5.0 g/dL Final  . AST 03/18/2016 27  15 - 41 U/L Final  . ALT 03/18/2016 14  14 - 54 U/L Final  . Alkaline Phosphatase 03/18/2016 64  38 - 126 U/L Final  . Total Bilirubin 03/18/2016 0.4  0.3 - 1.2 mg/dL Final  . GFR calc non Af Amer 03/18/2016 45* >60 mL/min Final  . GFR calc Af Amer 03/18/2016 52* >60 mL/min Final   Comment: (NOTE) The eGFR has been calculated  using the CKD EPI  equation. This calculation has not been validated in all clinical situations. eGFR's persistently <60 mL/min signify possible Chronic Kidney Disease.   . Anion gap 03/18/2016 6  5 - 15 Final  . Magnesium 03/18/2016 1.9  1.7 - 2.4 mg/dL Final      STUDIES: No results found.  ASSESSMENT: 1.  Carcinoma of hypopharynx extending to multiple sites with vocal cord being immobile (pyriform sinus tumor extending to the vocal cord and lateral pharyngeal wall) T3 N1 M0 tumor stage III Poor tolerance to cis-platinum chemotherapy Patient has finished radiation and carboplatinum therapy Still continues to lose weight.  Having difficulty improving diet.  Does have a PEG tube not using it appropriately.  Refuses to fit through PEG tube.  Family is concerned about overall situation at home.  Son desires to get social worker involved and eating social services to evaluate home situation. Patient does not want to go into any assisted living facility. Patient has been referred to the social WORKER. A repeat PET scan in 8 weeks.  Patient and family is aware of my retirement plan and will be followed by my associate ENT Evaluation has been planned All lab data has been reviewed Total duration of visit was 30  minutes.  50% or more time was spent in counseling patient and family regarding prognosis and options of treatment and available resources 4-6 weeks, schedule appt to see Dr. Richardson Landry for head and neck cancer follow up 2. 8 weeks, schedule PET scan for head and neck cancer restaging No matching staging information was found for the patient.  [name], MD   03/23/2016 6:52 PM

## 2016-04-06 ENCOUNTER — Ambulatory Visit: Payer: Medicare Other | Admitting: Radiation Oncology

## 2016-04-06 ENCOUNTER — Inpatient Hospital Stay: Admission: RE | Admit: 2016-04-06 | Payer: Medicare Other | Source: Ambulatory Visit | Admitting: Radiation Oncology

## 2016-04-15 ENCOUNTER — Ambulatory Visit
Admission: RE | Admit: 2016-04-15 | Discharge: 2016-04-15 | Disposition: A | Discharge status: Home / Self Care | Payer: Medicare Other | Source: Ambulatory Visit | Attending: Radiation Oncology | Admitting: Radiation Oncology

## 2016-04-15 ENCOUNTER — Inpatient Hospital Stay: Payer: Medicare Other | Attending: Oncology

## 2016-04-15 ENCOUNTER — Encounter: Payer: Self-pay | Admitting: Radiation Oncology

## 2016-04-15 ENCOUNTER — Inpatient Hospital Stay (HOSPITAL_BASED_OUTPATIENT_CLINIC_OR_DEPARTMENT_OTHER): Payer: Medicare Other | Admitting: Oncology

## 2016-04-15 VITALS — BP 141/74 | HR 89 | Temp 95.0°F | Resp 18 | Wt 106.9 lb

## 2016-04-15 VITALS — BP 134/72 | HR 109 | Temp 95.0°F | Resp 18 | Wt 106.0 lb

## 2016-04-15 DIAGNOSIS — M8580 Other specified disorders of bone density and structure, unspecified site: Secondary | ICD-10-CM | POA: Diagnosis not present

## 2016-04-15 DIAGNOSIS — F17210 Nicotine dependence, cigarettes, uncomplicated: Secondary | ICD-10-CM | POA: Diagnosis not present

## 2016-04-15 DIAGNOSIS — Z79899 Other long term (current) drug therapy: Secondary | ICD-10-CM

## 2016-04-15 DIAGNOSIS — K5790 Diverticulosis of intestine, part unspecified, without perforation or abscess without bleeding: Secondary | ICD-10-CM

## 2016-04-15 DIAGNOSIS — Z85818 Personal history of malignant neoplasm of other sites of lip, oral cavity, and pharynx: Secondary | ICD-10-CM | POA: Insufficient documentation

## 2016-04-15 DIAGNOSIS — C139 Malignant neoplasm of hypopharynx, unspecified: Secondary | ICD-10-CM

## 2016-04-15 DIAGNOSIS — R1310 Dysphagia, unspecified: Secondary | ICD-10-CM | POA: Insufficient documentation

## 2016-04-15 DIAGNOSIS — Z923 Personal history of irradiation: Secondary | ICD-10-CM | POA: Diagnosis not present

## 2016-04-15 DIAGNOSIS — Z95828 Presence of other vascular implants and grafts: Secondary | ICD-10-CM

## 2016-04-15 DIAGNOSIS — I10 Essential (primary) hypertension: Secondary | ICD-10-CM | POA: Insufficient documentation

## 2016-04-15 DIAGNOSIS — Z803 Family history of malignant neoplasm of breast: Secondary | ICD-10-CM | POA: Diagnosis not present

## 2016-04-15 DIAGNOSIS — M129 Arthropathy, unspecified: Secondary | ICD-10-CM

## 2016-04-15 DIAGNOSIS — Z8719 Personal history of other diseases of the digestive system: Secondary | ICD-10-CM | POA: Insufficient documentation

## 2016-04-15 DIAGNOSIS — Z85038 Personal history of other malignant neoplasm of large intestine: Secondary | ICD-10-CM | POA: Diagnosis not present

## 2016-04-15 DIAGNOSIS — Z538 Procedure and treatment not carried out for other reasons: Secondary | ICD-10-CM | POA: Insufficient documentation

## 2016-04-15 DIAGNOSIS — Z9221 Personal history of antineoplastic chemotherapy: Secondary | ICD-10-CM | POA: Insufficient documentation

## 2016-04-15 DIAGNOSIS — Z931 Gastrostomy status: Secondary | ICD-10-CM | POA: Insufficient documentation

## 2016-04-15 LAB — CBC WITH DIFFERENTIAL/PLATELET
BASOS PCT: 0 %
Basophils Absolute: 0 10*3/uL (ref 0–0.1)
EOS ABS: 0 10*3/uL (ref 0–0.7)
EOS PCT: 1 %
HCT: 38.2 % (ref 35.0–47.0)
HEMOGLOBIN: 13.1 g/dL (ref 12.0–16.0)
LYMPHS ABS: 0.9 10*3/uL — AB (ref 1.0–3.6)
LYMPHS PCT: 13 %
MCH: 32.1 pg (ref 26.0–34.0)
MCHC: 34.3 g/dL (ref 32.0–36.0)
MCV: 93.6 fL (ref 80.0–100.0)
MONOS PCT: 10 %
Monocytes Absolute: 0.7 10*3/uL (ref 0.2–0.9)
NEUTROS PCT: 76 %
Neutro Abs: 5.5 10*3/uL (ref 1.4–6.5)
PLATELETS: 245 10*3/uL (ref 150–440)
RBC: 4.09 MIL/uL (ref 3.80–5.20)
RDW: 17.2 % — ABNORMAL HIGH (ref 11.5–14.5)
WBC: 7.2 10*3/uL (ref 3.6–11.0)

## 2016-04-15 LAB — COMPREHENSIVE METABOLIC PANEL
ALBUMIN: 4.1 g/dL (ref 3.5–5.0)
ALK PHOS: 66 U/L (ref 38–126)
ALT: 12 U/L — AB (ref 14–54)
ANION GAP: 7 (ref 5–15)
AST: 23 U/L (ref 15–41)
BUN: 22 mg/dL — ABNORMAL HIGH (ref 6–20)
CALCIUM: 9.8 mg/dL (ref 8.9–10.3)
CHLORIDE: 104 mmol/L (ref 101–111)
CO2: 28 mmol/L (ref 22–32)
CREATININE: 1.28 mg/dL — AB (ref 0.44–1.00)
GFR calc Af Amer: 45 mL/min — ABNORMAL LOW (ref >60)
GFR calc non Af Amer: 38 mL/min — ABNORMAL LOW (ref >60)
GLUCOSE: 199 mg/dL — AB (ref 65–99)
Potassium: 3.2 mmol/L — ABNORMAL LOW (ref 3.5–5.1)
SODIUM: 139 mmol/L (ref 135–145)
Total Bilirubin: 0.6 mg/dL (ref 0.3–1.2)
Total Protein: 7.2 g/dL (ref 6.5–8.1)

## 2016-04-15 LAB — MAGNESIUM: Magnesium: 1.9 mg/dL (ref 1.7–2.4)

## 2016-04-15 MED ORDER — SODIUM CHLORIDE 0.9% FLUSH
10.0000 mL | INTRAVENOUS | Status: DC | PRN
  Administered 2016-04-15: 10 mL via INTRAVENOUS
  Filled 2016-04-15: qty 10

## 2016-04-15 MED ORDER — HEPARIN SOD (PORK) LOCK FLUSH 100 UNIT/ML IV SOLN
500.0000 [IU] | Freq: Once | INTRAVENOUS | Status: AC
  Administered 2016-04-15: 500 [IU] via INTRAVENOUS
  Filled 2016-04-15: qty 5

## 2016-04-15 NOTE — Progress Notes (Signed)
States is feeling well. Continues to have difficulty swallowing solid foods but is able to swallow liquids. PEG tube in place but pt states is not using at this time.

## 2016-04-15 NOTE — Progress Notes (Signed)
Radiation Oncology Follow up Note  Name: Emily [name]   Date:   04/15/2016 MRN:  [id number] DOB: 1933-05-28    This 82 y.o. female presents to the clinic today for follow-up for stage IV a carcinoma the hypopharynx status post combined modality chemoradiation now seen 1 month out.  REFERRING PROVIDER: [name], MD  HPI: Patient is a 83 year old female now 1 month out having completed combined modality treatment with chemoradiation for stage for a squamous cell carcinoma the hypopharynx extending into the puriform sinus and right vocal cord and positive ipsilateral lymph node diagnosed in February 2017. She is seen today in routine follow-up and is doing fairly well. She does have a history of prior rectal cancer status post abdominoperineal resection. She also received preoperative chemoradiation back in 2014 for rectal cancer. Seen today in routine follow-up she's having no dysphagia at this time. P when take is fair she is not using her feeding tube at all at this time. She specifically denies head and neck pain..  COMPLICATIONS OF TREATMENT: none  FOLLOW UP COMPLIANCE: keeps appointments   PHYSICAL EXAM:  BP 134/72 mmHg  Pulse 109  Temp(Src) 95 F (35 C)  Resp 18  Wt 106 lb 0.7 oz (48.1 kg) Oral cavity is clear no oral mucosal lesions are identified. Indirect mirror examination shows upper airway clear cord approximately well vallecula and base of tongue within normal limits. No evidence of subject gastric cervical or supraclavicular adenopathy is identified. Well-developed well-nourished patient in NAD. HEENT reveals PERLA, EOMI, discs not visualized.  Oral cavity is clear. No oral mucosal lesions are identified. Neck is clear without evidence of cervical or supraclavicular adenopathy. Lungs are clear to A&P. Cardiac examination is essentially unremarkable with regular rate and rhythm without murmur rub or thrill. Abdomen is benign with no organomegaly or masses noted. Motor  sensory and DTR levels are equal and symmetric in the upper and lower extremities. Cranial nerves II through XII are grossly intact. Proprioception is intact. No peripheral adenopathy or edema is identified. No motor or sensory levels are noted. Crude visual fields are within normal range.  RADIOLOGY RESULTS: No current films for review  PLAN: By clinical exam she's had a complete response to combined modality treatment. I am please were overall progress. Son is asked me about removal of PEG tube I'll defer that to medical oncology. She is not using at this time it may be safe to remove. I've encouraged her to increase her overall nutritional intake. I have asked to see her back in 3-4 months for follow-up. Anticipate seeing a PET CT scan at that time. Patient and son know to call with any concerns.  I would like to take this opportunity to thank you for allowing me to participate in the care of your patient.Armstead Peaks., MD

## 2016-04-20 DIAGNOSIS — R490 Dysphonia: Secondary | ICD-10-CM | POA: Diagnosis not present

## 2016-04-20 DIAGNOSIS — R1310 Dysphagia, unspecified: Secondary | ICD-10-CM | POA: Diagnosis not present

## 2016-04-20 DIAGNOSIS — C139 Malignant neoplasm of hypopharynx, unspecified: Secondary | ICD-10-CM | POA: Diagnosis not present

## 2016-05-02 NOTE — Progress Notes (Signed)
[place]  Telephone:(336) [phone] Fax:(336) [phone]  ID: [name] OB: [birth year]  MR#: [id number]  [id number]  Patient Care Team: [name], MD as PCP - General (Family Medicine) [name], MD (General Surgery) [name], MD (Unknown Physician Specialty) [name], MD (Family Medicine) [name], MD (General Surgery)  CHIEF COMPLAINT: Stage III squamous cell carcinoma of the hypopharynx, history of rectal cancer. Chief Complaint  Patient presents with  . head and neck cancer    INTERVAL HISTORY: Patient is an 82 year old female who recently finished concurrent chemotherapy and XRT approximately one month ago. She returns to clinic to establish care and further evaluation. She continues to have decreased oral intake, but admits this is improving. She also has dysphagia, but states this is improving as well. She denies any pain. She has a PEG tube in place, but does not use it. She has no neurologic complaint. She denies any fevers. She denies any weight loss. She has no chest pain or shortness of breath. She denies any nausea, vomiting, constipation, or diarrhea. She has no urinary complaints. Patient otherwise feels well and offers no further specific complaints.  REVIEW OF SYSTEMS:   Review of Systems  Constitutional: Negative.  Negative for fever, weight loss and malaise/fatigue.  HENT: Positive for sore throat.   Respiratory: Negative.  Negative for shortness of breath.   Cardiovascular: Negative.  Negative for chest pain.  Gastrointestinal: Negative.  Negative for abdominal pain.  Genitourinary: Negative.   Musculoskeletal: Negative.   Neurological: Negative.  Negative for weakness and headaches.  Psychiatric/Behavioral: Negative.  The patient is not nervous/anxious.     As per HPI. Otherwise, a complete review of systems is negatve.  PAST MEDICAL HISTORY: Past Medical History  Diagnosis Date  . Status  post chemotherapy     colon cancer  . Status post radiation therapy     colon cancer 2013  . Hypertension   . Diverticulosis   . Hemorrhoids   . Osteopenia   . History of chicken pox   . Arthritis     fingers  . Cancer ([place])     rectal  . Cancer ([place]) 2012    colon cancer  . Cancer of contiguous sites of hypopharynx ([place]) 12/23/2015  . Difficult intubation   . Colon cancer ([place])     PAST SURGICAL HISTORY: Past Surgical History  Procedure Laterality Date  . Appendectomy    . Abdominal hysterectomy    . Tonsillectomy    . Eus N/A 01/20/2013    Procedure: LOWER ENDOSCOPIC ULTRASOUND (EUS);  Surgeon: Milus Banister, MD;  Location: [facility];  Service: Endoscopy;  Laterality: N/A;  . Colonoscopy    . Colon surgery  06/28/2013    resected tumor from colon; [facility]  . Appendectomy  [id number]    Dr. Pat Patrick  . Abdominal hysterectomy  1980    partial  . Dilation and curettage of uterus  1957  . Colonoscopy N/A 10/01/2015    Procedure: COLONOSCOPY;  Surgeon: [name], MD;  Location: [facility] ENDOSCOPY;  Service: Endoscopy;  Laterality: N/A;  . Direct laryngoscopy N/A 12/11/2015    Procedure: DIRECT LARYNGOSCOPY;  Surgeon: Clyde Canterbury, MD;  Location: [facility] ORS;  Service: ENT;  Laterality: N/A;  . Esophagoscopy N/A 12/11/2015    Procedure: ESOPHAGOSCOPY;  Surgeon: Clyde Canterbury, MD;  Location: [facility] ORS;  Service: ENT;  Laterality: N/A;  . Breast surgery      cyst removal  .  Breast cyst aspiration Bilateral     negative  . Breast surgery  1960    Breast Biopsy  . Portacath placement Right 01/01/2016    Procedure: INSERTION PORT-A-CATH;  Surgeon: [name], MD;  Location: [facility] ORS;  Service: General;  Laterality: Right;  . Peg placement N/A 01/01/2016    Procedure: PERCUTANEOUS ENDOSCOPIC GASTROSTOMY (PEG) PLACEMENT;  Surgeon: [name], MD;  Location: [facility] ORS;  Service: General;  Laterality: N/A;    FAMILY HISTORY Family History  Problem  Relation Age of Onset  . Breast cancer Maternal Grandmother   . Kidney failure Father        ADVANCED DIRECTIVES:    HEALTH MAINTENANCE: Social History  Substance Use Topics  . Smoking status: Current Every Day Smoker -- 1.00 packs/day for 64 years    Types: Cigarettes  . Smokeless tobacco: Never Used  . Alcohol Use: 0.0 oz/week    0 Standard drinks or equivalent per week     Comment: rarely     Colonoscopy:  PAP:  Bone density:  Lipid panel:  Allergies  Allergen Reactions  . Amlodipine Besylate Other (See Comments)    Patient unaware of any allergy with this medicine  . Oxycodone     confusion    Current Outpatient Prescriptions  Medication Sig Dispense Refill  . metoprolol succinate (TOPROL-XL) 25 MG 24 hr tablet TAKE ONE-HALF TABLET BY MOUTH ONCE DAILY* NEEDS OFFICE VISIT* 90 tablet 3  . Nutritional Supplements (ENSURE PLUS PO) Take by mouth.     Current Facility-Administered Medications  Medication Dose Route Frequency Provider Last Rate Last Dose  . sodium chloride flush (NS) 0.9 % injection 10 mL  10 mL Intravenous PRN Lloyd Huger, MD   10 mL at 04/15/16 1600   Facility-Administered Medications Ordered in Other Visits  Medication Dose Route Frequency Provider Last Rate Last Dose  . 0.9 %  sodium chloride infusion   Intravenous Once Mayra Reel, NP        OBJECTIVE: Filed Vitals:   04/15/16 1514  BP: 141/74  Pulse: 89  Temp: 95 F (35 C)  Resp: 18     Body mass index is 17.79 kg/(m^2).    ECOG FS:1 - Symptomatic but completely ambulatory  General: Well-developed, well-nourished, no acute distress. Eyes: Pink conjunctiva, anicteric sclera. HEENT: Oropharynx clear without erythema or exudate. No palpable lymphadenopathy. Lungs: Clear to auscultation bilaterally. Heart: Regular rate and rhythm. No rubs, murmurs, or gallops. Abdomen: Soft, nontender, nondistended. PEG tube in place without erythema. Musculoskeletal: No edema, cyanosis, or  clubbing. Neuro: Alert, answering all questions appropriately. Cranial nerves grossly intact. Skin: No rashes or petechiae noted. Psych: Normal affect.   LAB RESULTS:  Lab Results  Component Value Date   NA 139 04/15/2016   K 3.2* 04/15/2016   CL 104 04/15/2016   CO2 28 04/15/2016   GLUCOSE 199* 04/15/2016   BUN 22* 04/15/2016   CREATININE 1.28* 04/15/2016   CALCIUM 9.8 04/15/2016   PROT 7.2 04/15/2016   ALBUMIN 4.1 04/15/2016   AST 23 04/15/2016   ALT 12* 04/15/2016   ALKPHOS 66 04/15/2016   BILITOT 0.6 04/15/2016   GFRNONAA 38* 04/15/2016   GFRAA 45* 04/15/2016    Lab Results  Component Value Date   WBC 7.2 04/15/2016   NEUTROABS 5.5 04/15/2016   HGB 13.1 04/15/2016   HCT 38.2 04/15/2016   MCV 93.6 04/15/2016   PLT 245 04/15/2016   Lab Results  Component Value Date   CEA  4.9* 05/20/2015     STUDIES: No results found.  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  PLAN:    1. Squamous cell carcinoma the hypopharynx: Patient completed her XRT approximately one month ago. She received one dose of cisplatin on January 13, 2016, but then was switched to carboplatinum completing in additional 4 cycles on February 19, 2016. By clinical exam, patient appears to have a complete response. She has a PET scan scheduled for May 14, 2016. She has been instructed to keep her follow-up to discuss the results on May 19, 2016. She has also been instructed to keep her regular ENT follow-up. 2. Dysphasia: Improving. 3. PEG tube: Patient is inquiring about having a tube removed, but has agreed until after her PET scan to confirm resolution of her disease prior to referral to surgery. If that is negative, we will refer her back to Dr. Bary Castilla for PEG tube removal. 4. History of rectal cancer: Patient received neoadjuvant chemoradiation in approximately April 2014. Patient underwent surgery at [facility]. By report, patient could not tolerate postoperative  chemotherapy with FOLFOX. Her most recent CEA in July 2016 was mildly elevated at 4.9.  Approximately 30 minutes was spent in discussion of which greater than 50% was consultation.  Patient expressed understanding and was in agreement with this plan. She also understands that She can call clinic at any time with any questions, concerns, or complaints.   Cancer of contiguous sites of hypopharynx [facility])   Staging form: Pharynx - Hypopharynx, AJCC 7th Edition     Clinical: Stage III (T3, N1, M0) - Signed by [name], MD on 12/23/2015   Lloyd Huger, MD   05/02/2016 7:08 AM

## 2016-05-14 ENCOUNTER — Ambulatory Visit
Admission: RE | Admit: 2016-05-14 | Discharge: 2016-05-14 | Disposition: A | Discharge status: Home / Self Care | Payer: Medicare Other | Source: Ambulatory Visit | Attending: Oncology | Admitting: Oncology

## 2016-05-14 DIAGNOSIS — R590 Localized enlarged lymph nodes: Secondary | ICD-10-CM | POA: Insufficient documentation

## 2016-05-14 DIAGNOSIS — I2510 Atherosclerotic heart disease of native coronary artery without angina pectoris: Secondary | ICD-10-CM | POA: Diagnosis not present

## 2016-05-14 DIAGNOSIS — I700 Atherosclerosis of aorta: Secondary | ICD-10-CM | POA: Diagnosis not present

## 2016-05-14 DIAGNOSIS — K8020 Calculus of gallbladder without cholecystitis without obstruction: Secondary | ICD-10-CM | POA: Insufficient documentation

## 2016-05-14 DIAGNOSIS — C139 Malignant neoplasm of hypopharynx, unspecified: Secondary | ICD-10-CM | POA: Diagnosis not present

## 2016-05-14 DIAGNOSIS — C140 Malignant neoplasm of pharynx, unspecified: Secondary | ICD-10-CM | POA: Diagnosis not present

## 2016-05-14 DIAGNOSIS — J439 Emphysema, unspecified: Secondary | ICD-10-CM | POA: Diagnosis not present

## 2016-05-14 LAB — GLUCOSE, CAPILLARY: Glucose-Capillary: 104 mg/dL — ABNORMAL HIGH (ref 65–99)

## 2016-05-14 MED ORDER — FLUDEOXYGLUCOSE F - 18 (FDG) INJECTION
12.9700 | Freq: Once | INTRAVENOUS | Status: AC
  Administered 2016-05-14: 12.97 via INTRAVENOUS

## 2016-05-19 ENCOUNTER — Encounter: Payer: Self-pay | Admitting: *Deleted

## 2016-05-19 ENCOUNTER — Inpatient Hospital Stay: Payer: Medicare Other | Attending: Oncology | Admitting: Oncology

## 2016-05-19 ENCOUNTER — Other Ambulatory Visit: Payer: Self-pay | Admitting: *Deleted

## 2016-05-19 ENCOUNTER — Inpatient Hospital Stay: Payer: Medicare Other

## 2016-05-19 VITALS — BP 158/78 | HR 78 | Temp 97.0°F | Resp 18 | Wt 101.7 lb

## 2016-05-19 DIAGNOSIS — C139 Malignant neoplasm of hypopharynx, unspecified: Secondary | ICD-10-CM

## 2016-05-19 DIAGNOSIS — Z923 Personal history of irradiation: Secondary | ICD-10-CM | POA: Insufficient documentation

## 2016-05-19 DIAGNOSIS — R1310 Dysphagia, unspecified: Secondary | ICD-10-CM | POA: Diagnosis not present

## 2016-05-19 DIAGNOSIS — J439 Emphysema, unspecified: Secondary | ICD-10-CM | POA: Insufficient documentation

## 2016-05-19 DIAGNOSIS — Z95828 Presence of other vascular implants and grafts: Secondary | ICD-10-CM

## 2016-05-19 DIAGNOSIS — M8580 Other specified disorders of bone density and structure, unspecified site: Secondary | ICD-10-CM | POA: Diagnosis not present

## 2016-05-19 DIAGNOSIS — I700 Atherosclerosis of aorta: Secondary | ICD-10-CM | POA: Diagnosis not present

## 2016-05-19 DIAGNOSIS — R630 Anorexia: Secondary | ICD-10-CM | POA: Diagnosis not present

## 2016-05-19 DIAGNOSIS — I2510 Atherosclerotic heart disease of native coronary artery without angina pectoris: Secondary | ICD-10-CM | POA: Diagnosis not present

## 2016-05-19 DIAGNOSIS — R918 Other nonspecific abnormal finding of lung field: Secondary | ICD-10-CM

## 2016-05-19 DIAGNOSIS — K8020 Calculus of gallbladder without cholecystitis without obstruction: Secondary | ICD-10-CM | POA: Insufficient documentation

## 2016-05-19 DIAGNOSIS — Z9221 Personal history of antineoplastic chemotherapy: Secondary | ICD-10-CM | POA: Diagnosis not present

## 2016-05-19 DIAGNOSIS — M129 Arthropathy, unspecified: Secondary | ICD-10-CM | POA: Insufficient documentation

## 2016-05-19 DIAGNOSIS — Z931 Gastrostomy status: Secondary | ICD-10-CM | POA: Diagnosis not present

## 2016-05-19 DIAGNOSIS — I10 Essential (primary) hypertension: Secondary | ICD-10-CM | POA: Insufficient documentation

## 2016-05-19 DIAGNOSIS — Z85048 Personal history of other malignant neoplasm of rectum, rectosigmoid junction, and anus: Secondary | ICD-10-CM | POA: Insufficient documentation

## 2016-05-19 DIAGNOSIS — F17210 Nicotine dependence, cigarettes, uncomplicated: Secondary | ICD-10-CM | POA: Diagnosis not present

## 2016-05-19 LAB — COMPREHENSIVE METABOLIC PANEL
ALBUMIN: 3.8 g/dL (ref 3.5–5.0)
ALK PHOS: 70 U/L (ref 38–126)
ALT: 11 U/L — AB (ref 14–54)
ANION GAP: 5 (ref 5–15)
AST: 22 U/L (ref 15–41)
BUN: 20 mg/dL (ref 6–20)
CALCIUM: 9.8 mg/dL (ref 8.9–10.3)
CO2: 27 mmol/L (ref 22–32)
Chloride: 105 mmol/L (ref 101–111)
Creatinine, Ser: 1.02 mg/dL — ABNORMAL HIGH (ref 0.44–1.00)
GFR calc Af Amer: 59 mL/min — ABNORMAL LOW (ref >60)
GFR calc non Af Amer: 51 mL/min — ABNORMAL LOW (ref >60)
GLUCOSE: 111 mg/dL — AB (ref 65–99)
Potassium: 3.1 mmol/L — ABNORMAL LOW (ref 3.5–5.1)
SODIUM: 137 mmol/L (ref 135–145)
Total Bilirubin: 0.6 mg/dL (ref 0.3–1.2)
Total Protein: 6.7 g/dL (ref 6.5–8.1)

## 2016-05-19 LAB — CBC WITH DIFFERENTIAL/PLATELET
BASOS ABS: 0 10*3/uL (ref 0–0.1)
BASOS PCT: 0 %
EOS ABS: 0 10*3/uL (ref 0–0.7)
Eosinophils Relative: 1 %
HCT: 38.5 % (ref 35.0–47.0)
HEMOGLOBIN: 13.1 g/dL (ref 12.0–16.0)
Lymphocytes Relative: 20 %
Lymphs Abs: 1.1 10*3/uL (ref 1.0–3.6)
MCH: 31.9 pg (ref 26.0–34.0)
MCHC: 34 g/dL (ref 32.0–36.0)
MCV: 93.9 fL (ref 80.0–100.0)
Monocytes Absolute: 0.6 10*3/uL (ref 0.2–0.9)
Monocytes Relative: 11 %
NEUTROS PCT: 68 %
Neutro Abs: 3.6 10*3/uL (ref 1.4–6.5)
Platelets: 190 10*3/uL (ref 150–440)
RBC: 4.09 MIL/uL (ref 3.80–5.20)
RDW: 13.8 % (ref 11.5–14.5)
WBC: 5.3 10*3/uL (ref 3.6–11.0)

## 2016-05-19 LAB — MAGNESIUM: Magnesium: 1.8 mg/dL (ref 1.7–2.4)

## 2016-05-19 MED ORDER — MEGESTROL ACETATE 40 MG PO TABS: 40.0000 mg | ORAL_TABLET | Freq: Two times a day (BID) | ORAL | Status: DC

## 2016-05-19 MED ORDER — DOXYCYCLINE HYCLATE 100 MG PO CAPS: 100.0000 mg | ORAL_CAPSULE | Freq: Two times a day (BID) | ORAL | Status: DC

## 2016-05-19 MED ORDER — HEPARIN SOD (PORK) LOCK FLUSH 100 UNIT/ML IV SOLN
500.0000 [IU] | Freq: Once | INTRAVENOUS | Status: AC
  Administered 2016-05-19: 500 [IU] via INTRAVENOUS

## 2016-05-19 MED ORDER — SODIUM CHLORIDE 0.9% FLUSH
10.0000 mL | INTRAVENOUS | Status: DC | PRN
  Administered 2016-05-19: 10 mL via INTRAVENOUS
  Filled 2016-05-19: qty 10

## 2016-05-19 NOTE — Progress Notes (Signed)
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 Chief Complaint  Patient presents with  . head and neck cancer    INTERVAL HISTORY: Patient returns to clinic for further evaluation and discussion of her PET scan results. She has some mild erythema surrounding her PEG tube site, but otherwise feels well. She continues to have a poor appetite. She also has dysphagia, but states this is improving as well. She denies any pain. She has no neurologic complaints. She denies any fevers. She denies any weight loss. She has no chest pain or shortness of breath. She denies any nausea, vomiting, constipation, or diarrhea. She has no urinary complaints. Patient offers no further specific complaints.  REVIEW OF SYSTEMS:   Review of Systems  Constitutional: Negative.  Negative for fever, weight loss and malaise/fatigue.  HENT: Positive for sore throat.   Respiratory: Negative.  Negative for shortness of breath.   Cardiovascular: Negative.  Negative for chest pain.  Gastrointestinal: Negative.  Negative for abdominal pain.  Genitourinary: Negative.   Musculoskeletal: Negative.   Neurological: Negative.  Negative for weakness and headaches.  Psychiatric/Behavioral: Negative.  The patient is not nervous/anxious.     As per HPI. Otherwise, a complete review of systems is negatve.  PAST MEDICAL HISTORY: Past Medical History  Diagnosis Date  . Status post chemotherapy     colon cancer  . Status post radiation therapy     colon cancer 2013  . Hypertension   .  Diverticulosis   . Hemorrhoids   . Osteopenia   . History of chicken pox   . Arthritis     fingers  . Cancer ([place])     rectal  . Cancer ([place]) 2012    colon cancer  . Cancer of contiguous sites of hypopharynx ([place]) 12/23/2015  . Difficult intubation   . Colon cancer ([place])     PAST SURGICAL HISTORY: Past Surgical History  Procedure Laterality Date  . Appendectomy    . Abdominal hysterectomy    . Tonsillectomy    . Eus N/A 01/20/2013    Procedure: LOWER ENDOSCOPIC ULTRASOUND (EUS);  Surgeon: Milus Banister, MD;  Location: [facility];  Service: Endoscopy;  Laterality: N/A;  . Colonoscopy    . Colon surgery  06/28/2013    resected tumor from colon; [facility]  . Appendectomy  [id number]    Dr. Pat Patrick  . Abdominal hysterectomy  1980    partial  . Dilation and curettage of uterus  1957  . Colonoscopy N/A 10/01/2015    Procedure: COLONOSCOPY;  Surgeon: [name], MD;  Location: [facility];  Service: Endoscopy;  Laterality: N/A;  . Direct laryngoscopy N/A 12/11/2015    Procedure: DIRECT LARYNGOSCOPY;  Surgeon: Clyde Canterbury, MD;  Location: [facility] ORS;  Service: ENT;  Laterality: N/A;  . Esophagoscopy N/A 12/11/2015    Procedure: ESOPHAGOSCOPY;  Surgeon: Clyde Canterbury, MD;  Location: [facility] ORS;  Service: ENT;  Laterality: N/A;  . Breast surgery      cyst removal  . Breast cyst aspiration Bilateral     negative  . Breast surgery  1960  Breast Biopsy  . Portacath placement Right 01/01/2016    Procedure: INSERTION PORT-A-CATH;  Surgeon: [name], MD;  Location: [facility] ORS;  Service: General;  Laterality: Right;  . Peg placement N/A 01/01/2016    Procedure: PERCUTANEOUS ENDOSCOPIC GASTROSTOMY (PEG) PLACEMENT;  Surgeon: [name], MD;  Location: [facility] ORS;  Service: General;  Laterality: N/A;    FAMILY HISTORY Family History  Problem Relation Age of Onset  . Breast cancer Maternal Grandmother   . Kidney failure Father        ADVANCED  DIRECTIVES:    HEALTH MAINTENANCE: Social History  Substance Use Topics  . Smoking status: Current Every Day Smoker -- 1.00 packs/day for 64 years    Types: Cigarettes  . Smokeless tobacco: Never Used  . Alcohol Use: 0.0 oz/week    0 Standard drinks or equivalent per week     Comment: rarely     Colonoscopy:  PAP:  Bone density:  Lipid panel:  Allergies  Allergen Reactions  . Amlodipine Besylate Other (See Comments)    Patient unaware of any allergy with this medicine  . Oxycodone     confusion    Current Outpatient Prescriptions  Medication Sig Dispense Refill  . metoprolol succinate (TOPROL-XL) 25 MG 24 hr tablet TAKE ONE-HALF TABLET BY MOUTH ONCE DAILY* NEEDS OFFICE VISIT* 90 tablet 3  . Nutritional Supplements (ENSURE PLUS PO) Take by mouth.     No current facility-administered medications for this visit.   Facility-Administered Medications Ordered in Other Visits  Medication Dose Route Frequency Provider Last Rate Last Dose  . 0.9 %  sodium chloride infusion   Intravenous Once Mayra Reel, NP        OBJECTIVE: Filed Vitals:   05/19/16 1137  BP: 158/78  Pulse: 78  Temp: 97 F (36.1 C)  Resp: 18     Body mass index is 16.93 kg/(m^2).    ECOG FS:1 - Symptomatic but completely ambulatory  General: Well-developed, well-nourished, no acute distress. Eyes: Pink conjunctiva, anicteric sclera. HEENT: Oropharynx clear without erythema or exudate. No palpable lymphadenopathy. Lungs: Clear to auscultation bilaterally. Heart: Regular rate and rhythm. No rubs, murmurs, or gallops. Abdomen: Soft, nontender, nondistended. PEG tube in place with mild erythema at insertion site. Musculoskeletal: No edema, cyanosis, or clubbing. Neuro: Alert, answering all questions appropriately. Cranial nerves grossly intact. Skin: No rashes or petechiae noted. Psych: Normal affect.   LAB RESULTS:  Lab Results  Component Value Date   NA 137 05/19/2016   K 3.1* 05/19/2016   CL  105 05/19/2016   CO2 27 05/19/2016   GLUCOSE 111* 05/19/2016   BUN 20 05/19/2016   CREATININE 1.02* 05/19/2016   CALCIUM 9.8 05/19/2016   PROT 6.7 05/19/2016   ALBUMIN 3.8 05/19/2016   AST 22 05/19/2016   ALT 11* 05/19/2016   ALKPHOS 70 05/19/2016   BILITOT 0.6 05/19/2016   GFRNONAA 51* 05/19/2016   GFRAA 59* 05/19/2016    Lab Results  Component Value Date   WBC 5.3 05/19/2016   NEUTROABS 3.6 05/19/2016   HGB 13.1 05/19/2016   HCT 38.5 05/19/2016   MCV 93.9 05/19/2016   PLT 190 05/19/2016   Lab Results  Component Value Date   CEA 4.9* 05/20/2015     STUDIES: Nm Pet Image Restag (ps) Skull Base To Thigh  05/14/2016  CLINICAL DATA:  Subsequent treatment strategy for invasive squamous cell carcinoma of the right hypopharynx/ piriform sinus diagnosed February 2017, status post combined chemoradiation therapy. Additional history of  rectal carcinoma status post resection. EXAM: NUCLEAR MEDICINE PET SKULL BASE TO THIGH TECHNIQUE: 13.0 mCi F-18 FDG was injected intravenously. Full-ring PET imaging was performed from the skull base to thigh after the radiotracer. CT data was obtained and used for attenuation correction and anatomic localization. FASTING BLOOD GLUCOSE:  Value: 104 mg/dl COMPARISON:  12/19/2015 PET-CT. FINDINGS: NECK No hypermetabolic lymph nodes in the neck. No residual enlarged or hypermetabolic lymph nodes in the right level 2 neck. No residual hypermetabolism in the right hypopharynx. Mild asymmetric hypermetabolism in the right vocal cord (max SUV 4.2), favor post treatment change and/or physiologic uptake, cannot exclude mild residual tumor. CHEST No hypermetabolic axillary, mediastinal or hilar nodes. Right subclavian MediPort terminates at the cavoatrial junction. Coronary atherosclerosis. Atherosclerotic nonaneurysmal thoracic aorta. No pleural effusions. Mild secretions in the non dependent trachea lumen. Mild centrilobular emphysema. Non hypermetabolic irregular  right upper lobe 8 mm pulmonary nodule (series 3/image 67) is stable back to 01/09/2013, most consistent with benign etiology. No acute consolidative airspace disease or new significant pulmonary nodules. ABDOMEN/PELVIS No abnormal hypermetabolic activity within the liver, pancreas, adrenal glands, or spleen. No hypermetabolic lymph nodes in the abdomen or pelvis. Cholelithiasis. No biliary ductal dilatation. Atherosclerotic nonaneurysmal abdominal aorta. Well-positioned percutaneous gastrostomy tube in the proximal stomach. Stable postsurgical changes from distal Proctor colectomy with colorectal anastomosis. Mild distal colonic diverticulosis. Hysterectomy. SKELETON No focal hypermetabolic activity to suggest skeletal metastasis. IMPRESSION: 1. Complete metabolic response in the right hypopharynx. Mild asymmetric hypermetabolism in the right vocal cord, favor post treatment change and/or physiologic uptake, cannot exclude mild residual tumor. 2. Complete metabolic response in the right level 2 neck adenopathy. No new or residual hypermetabolic metastatic disease. 3. Additional findings include aortic atherosclerosis, coronary atherosclerosis, mild emphysema and cholelithiasis. Electronically Signed   By: Ilona Sorrel M.D.   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  PLAN:    1. Squamous cell carcinoma the hypopharynx: Patient completed concurrent chemotherapy and XRT approximately on February 19, 2016. By clinical exam, patient appears to have a complete response. PET scan results review independently and reported as above with a complete metabolic response. No further intervention is needed at this time.  Will do one final imaging in 6 months with CT scan.  If this is normal, no further imaging will be necessary unless there is suspicion of recurrence.  Return to clinic in 3 months for routine evaluation. She has also been instructed to keep her regular ENT  follow-up. 2. Dysphasia: Improving. 3. PEG tube: Patient no longer is using her PEG tube.  Will send a referral Dr. Bary Castilla for removal.  Patient was also given a prescription for doxycycline for 7 days today for her mild erythema at the PEG site. 4. History of rectal cancer: Patient received neoadjuvant chemoradiation in approximately April 2014. Patient underwent surgery at [facility]. By report, patient could not tolerate postoperative chemotherapy with FOLFOX. Her most recent CEA in July 2016 was mildly elevated at 4.9.  Approximately 30 minutes was spent in discussion of which greater than 50% was consultation.  Patient expressed understanding and was in agreement with this plan. She also understands that She can call clinic at any time with any questions, concerns, or complaints.   Cancer of contiguous sites of hypopharynx [facility])   Staging form: Pharynx - Hypopharynx, AJCC 7th Edition     Clinical: Stage III (T3, N1, M0) - Signed by [name], MD on 12/23/2015  Lloyd Huger, MD   05/19/2016 11:50 AM

## 2016-05-19 NOTE — Progress Notes (Signed)
States is eating and drinking well but son states pt is not eating and drinking the amount she needs every day. Sunday noticed possible infection around feeding tube. Site was cleaned and neosporin was applied per pt's son. Pt's son requests that feeding tube be evaluated today to see if needs further treatment for possible infection.

## 2016-05-19 NOTE — Progress Notes (Unsigned)
Patient seen in clinic today. Prescription for Doxycycline 100 mg PO BID sent to patients pharmacy for coverage for possible infection at PEG tube site. Note faxed to Dr. Dwyane Luo office requesting appointment for evaluation for PEG tube removal at patients request. Prescription for Megace sent to patients pharmacy for appetite.

## 2016-06-02 ENCOUNTER — Telehealth: Payer: Self-pay | Admitting: *Deleted

## 2016-06-02 ENCOUNTER — Encounter: Payer: Self-pay | Admitting: Family Medicine

## 2016-06-02 ENCOUNTER — Ambulatory Visit (INDEPENDENT_AMBULATORY_CARE_PROVIDER_SITE_OTHER): Payer: Medicare Other | Admitting: Family Medicine

## 2016-06-02 VITALS — BP 130/64 | HR 94 | Temp 97.6°F | Resp 16 | Ht 65.0 in | Wt 102.4 lb

## 2016-06-02 DIAGNOSIS — R413 Other amnesia: Secondary | ICD-10-CM

## 2016-06-02 DIAGNOSIS — F32A Depression, unspecified: Secondary | ICD-10-CM

## 2016-06-02 DIAGNOSIS — F329 Major depressive disorder, single episode, unspecified: Secondary | ICD-10-CM

## 2016-06-02 DIAGNOSIS — R1310 Dysphagia, unspecified: Secondary | ICD-10-CM | POA: Insufficient documentation

## 2016-06-02 MED ORDER — FLUOXETINE HCL 10 MG PO TABS: 10.0000 mg | ORAL_TABLET | Freq: Every day | ORAL | 3 refills | Status: DC

## 2016-06-02 MED ORDER — SILVER SULFADIAZINE 1 % EX CREA: 1.0000 "application " | TOPICAL_CREAM | Freq: Every day | CUTANEOUS | 0 refills | Status: DC

## 2016-06-02 NOTE — Telephone Encounter (Addendum)
[name] [name] came into the office and was concern about the tube being removed. Patient is being seen by. Dr. Grayland Ormond. . Patient son stated that Dr. Grayland Ormond had mention something about the tube being removed and he need to make an appt with you to get that done. [name] is requesting a call back.  His number is [phone]. Thank you   The patient son reported that when they saw Dr. Grayland Ormond on July 18 used and a contact the office regarding PEG tube removal. The son reports that his mother seems to still have some mild dysphasia was concerned the possibility of scar tissue. She is scheduled to see her ENT, Malon Kindle, M.D. on August 17. We will hold an evaluation for PEG tube removal until after Dr. Richardson Landry assesses the patient. While she's not using the PEG tube, if there is any question of progressive dysphagia it would be better to leave it in place.

## 2016-06-02 NOTE — Progress Notes (Signed)
Patient: Emily [name] Female    DOB: 12-14-1932   82 y.o.   MRN: [id number] Visit Date: 06/02/2016  Today's Provider: Lelon Huh, MD   No chief complaint on file.  Subjective:    HPI Patient had throat cancer several months ago and completed chemotherapy and radiation in early May and follow up PET scan shows no sign of residual cancer. She states that she continues to have trouble swallowing solid food even after finishing all of her therapy. She doesn't have any trouble with liquids and her son states that she livers off of Ensure. She is scheduled to follow up with ENT Dr. Richardson Landry 06/18/16. She has been prescribed Megace to help with appetite, but her son states that she does not take as prescribed. He states she frequently forgets to take her medication and that she has generally been increasingly forgetful over the last several months and is concerned she may be developing dementia. He states she has very little energy and never feels like doing anything. She states she sleep well at night, but also feels sleepy and fatigued all day long.   Patient has also had G-tube placed at some point during her treatment and surrounding skin has been increasing irritated. She only used it a few times and does not want to start using it.      Allergies  Allergen Reactions  . Amlodipine Besylate Other (See Comments)    Patient unaware of any allergy with this medicine  . Oxycodone     confusion   Current Meds  Medication Sig  . megestrol (MEGACE) 40 MG tablet Take 1 tablet (40 mg total) by mouth 2 (two) times daily.  . metoprolol succinate (TOPROL-XL) 25 MG 24 hr tablet TAKE ONE-HALF TABLET BY MOUTH ONCE DAILY* NEEDS OFFICE VISIT*  . Nutritional Supplements (ENSURE PLUS PO) Take by mouth.    Review of Systems  Constitutional: Negative for appetite change, chills, fatigue and fever.  HENT: Positive for sore throat and trouble swallowing.   Respiratory: Negative for chest  tightness and shortness of breath.   Cardiovascular: Negative for chest pain and palpitations.  Gastrointestinal: Negative for abdominal pain, nausea and vomiting.  Neurological: Negative for dizziness and weakness.  Psychiatric/Behavioral: Positive for confusion and decreased concentration.    Social History  Substance Use Topics  . Smoking status: Current Every Day Smoker    Packs/day: 1.00    Years: 64.00    Types: Cigarettes  . Smokeless tobacco: Never Used  . Alcohol use 0.0 oz/week     Comment: rarely   Objective:   BP 130/64 (BP Location: Left Arm, Patient Position: Sitting, Cuff Size: Normal)   Pulse 94   Temp 97.6 F (36.4 C) (Oral)   Resp 16   Ht 5\' 5"  (1.651 m)   Wt 102 lb 6.4 oz (46.4 kg)   SpO2 99%   BMI 17.04 kg/m   Physical Exam   General Appearance:    Alert, cooperative, thin elderly female in no distress  Eyes:    PERRL, conjunctiva/corneas clear, EOM's intact       Lungs:     Clear to auscultation bilaterally, respirations unlabored  Heart:    Regular rate and rhythm  Neurologic:   Awake, alert, oriented x 3. No apparent focal neurological           defect.   Skin:   Well defined red irritated area inferior to G-tube. No drainage or other lesions.  6-CIT =4    Assessment & Plan:     1. Dysphagia Has ENT follow up in 2 weeks. Consider SLT or GI evaluation after obtaining MBS - SLP modified barium swallow; Future  2. Memory changes She does well on 6-CIT. I suspect this is aggravated by underlying depression. Her son is very concerned and would like further evaluation for depention.  - Ambulatory referral to Neurology  3. Depression Fluoxetine 10mg  daily  4. Skin irritation Use Silvadene Cream Daily   Her son is going to make an appointment with Dr. Fleet Contras to discuss removing G-tube which has been very irritating to her.       Lelon Huh, MD  [facility]

## 2016-06-12 ENCOUNTER — Other Ambulatory Visit: Payer: Self-pay | Admitting: Family Medicine

## 2016-06-12 DIAGNOSIS — R1310 Dysphagia, unspecified: Secondary | ICD-10-CM

## 2016-06-18 DIAGNOSIS — Z85818 Personal history of malignant neoplasm of other sites of lip, oral cavity, and pharynx: Secondary | ICD-10-CM | POA: Diagnosis not present

## 2016-06-18 DIAGNOSIS — B379 Candidiasis, unspecified: Secondary | ICD-10-CM | POA: Diagnosis not present

## 2016-06-21 ENCOUNTER — Inpatient Hospital Stay
Admission: EM | Admit: 2016-06-21 | Discharge: 2016-06-27 | DRG: 470 | Disposition: A | Discharge status: Skilled Nursing Facility | Payer: Medicare Other | Attending: Internal Medicine | Admitting: Internal Medicine

## 2016-06-21 ENCOUNTER — Emergency Department: Payer: Medicare Other

## 2016-06-21 DIAGNOSIS — Z66 Do not resuscitate: Secondary | ICD-10-CM | POA: Diagnosis present

## 2016-06-21 DIAGNOSIS — C140 Malignant neoplasm of pharynx, unspecified: Secondary | ICD-10-CM | POA: Diagnosis not present

## 2016-06-21 DIAGNOSIS — Z419 Encounter for procedure for purposes other than remedying health state, unspecified: Secondary | ICD-10-CM

## 2016-06-21 DIAGNOSIS — Z79899 Other long term (current) drug therapy: Secondary | ICD-10-CM | POA: Diagnosis not present

## 2016-06-21 DIAGNOSIS — Z8521 Personal history of malignant neoplasm of larynx: Secondary | ICD-10-CM | POA: Diagnosis not present

## 2016-06-21 DIAGNOSIS — D62 Acute posthemorrhagic anemia: Secondary | ICD-10-CM | POA: Diagnosis not present

## 2016-06-21 DIAGNOSIS — M8580 Other specified disorders of bone density and structure, unspecified site: Secondary | ICD-10-CM | POA: Diagnosis present

## 2016-06-21 DIAGNOSIS — G8918 Other acute postprocedural pain: Secondary | ICD-10-CM

## 2016-06-21 DIAGNOSIS — K5900 Constipation, unspecified: Secondary | ICD-10-CM | POA: Diagnosis present

## 2016-06-21 DIAGNOSIS — Z9181 History of falling: Secondary | ICD-10-CM | POA: Diagnosis not present

## 2016-06-21 DIAGNOSIS — E876 Hypokalemia: Secondary | ICD-10-CM | POA: Diagnosis present

## 2016-06-21 DIAGNOSIS — Y92039 Unspecified place in apartment as the place of occurrence of the external cause: Secondary | ICD-10-CM | POA: Diagnosis not present

## 2016-06-21 DIAGNOSIS — S72041A Displaced fracture of base of neck of right femur, initial encounter for closed fracture: Secondary | ICD-10-CM | POA: Diagnosis not present

## 2016-06-21 DIAGNOSIS — Z96641 Presence of right artificial hip joint: Secondary | ICD-10-CM | POA: Diagnosis not present

## 2016-06-21 DIAGNOSIS — S72001D Fracture of unspecified part of neck of right femur, subsequent encounter for closed fracture with routine healing: Secondary | ICD-10-CM | POA: Diagnosis not present

## 2016-06-21 DIAGNOSIS — Z841 Family history of disorders of kidney and ureter: Secondary | ICD-10-CM

## 2016-06-21 DIAGNOSIS — Z85048 Personal history of other malignant neoplasm of rectum, rectosigmoid junction, and anus: Secondary | ICD-10-CM | POA: Diagnosis not present

## 2016-06-21 DIAGNOSIS — I10 Essential (primary) hypertension: Secondary | ICD-10-CM | POA: Diagnosis present

## 2016-06-21 DIAGNOSIS — I959 Hypotension, unspecified: Secondary | ICD-10-CM | POA: Diagnosis present

## 2016-06-21 DIAGNOSIS — S72011A Unspecified intracapsular fracture of right femur, initial encounter for closed fracture: Principal | ICD-10-CM | POA: Diagnosis present

## 2016-06-21 DIAGNOSIS — S72001B Fracture of unspecified part of neck of right femur, initial encounter for open fracture type I or II: Secondary | ICD-10-CM | POA: Diagnosis not present

## 2016-06-21 DIAGNOSIS — Z9221 Personal history of antineoplastic chemotherapy: Secondary | ICD-10-CM

## 2016-06-21 DIAGNOSIS — F17210 Nicotine dependence, cigarettes, uncomplicated: Secondary | ICD-10-CM | POA: Diagnosis present

## 2016-06-21 DIAGNOSIS — Z431 Encounter for attention to gastrostomy: Secondary | ICD-10-CM | POA: Diagnosis not present

## 2016-06-21 DIAGNOSIS — L8990 Pressure ulcer of unspecified site, unspecified stage: Secondary | ICD-10-CM | POA: Insufficient documentation

## 2016-06-21 DIAGNOSIS — R4789 Other speech disturbances: Secondary | ICD-10-CM | POA: Diagnosis not present

## 2016-06-21 DIAGNOSIS — M6281 Muscle weakness (generalized): Secondary | ICD-10-CM | POA: Diagnosis not present

## 2016-06-21 DIAGNOSIS — M1611 Unilateral primary osteoarthritis, right hip: Secondary | ICD-10-CM | POA: Diagnosis present

## 2016-06-21 DIAGNOSIS — Z85818 Personal history of malignant neoplasm of other sites of lip, oral cavity, and pharynx: Secondary | ICD-10-CM | POA: Diagnosis not present

## 2016-06-21 DIAGNOSIS — Z923 Personal history of irradiation: Secondary | ICD-10-CM | POA: Diagnosis not present

## 2016-06-21 DIAGNOSIS — E860 Dehydration: Secondary | ICD-10-CM | POA: Diagnosis present

## 2016-06-21 DIAGNOSIS — N179 Acute kidney failure, unspecified: Secondary | ICD-10-CM | POA: Diagnosis present

## 2016-06-21 DIAGNOSIS — R634 Abnormal weight loss: Secondary | ICD-10-CM | POA: Diagnosis present

## 2016-06-21 DIAGNOSIS — I252 Old myocardial infarction: Secondary | ICD-10-CM | POA: Diagnosis present

## 2016-06-21 DIAGNOSIS — L89511 Pressure ulcer of right ankle, stage 1: Secondary | ICD-10-CM | POA: Diagnosis present

## 2016-06-21 DIAGNOSIS — Z931 Gastrostomy status: Secondary | ICD-10-CM | POA: Diagnosis not present

## 2016-06-21 DIAGNOSIS — D649 Anemia, unspecified: Secondary | ICD-10-CM | POA: Diagnosis not present

## 2016-06-21 DIAGNOSIS — F329 Major depressive disorder, single episode, unspecified: Secondary | ICD-10-CM | POA: Diagnosis present

## 2016-06-21 DIAGNOSIS — W19XXXA Unspecified fall, initial encounter: Secondary | ICD-10-CM | POA: Diagnosis present

## 2016-06-21 DIAGNOSIS — Z87891 Personal history of nicotine dependence: Secondary | ICD-10-CM | POA: Diagnosis not present

## 2016-06-21 DIAGNOSIS — Z681 Body mass index (BMI) 19 or less, adult: Secondary | ICD-10-CM | POA: Diagnosis not present

## 2016-06-21 DIAGNOSIS — I214 Non-ST elevation (NSTEMI) myocardial infarction: Secondary | ICD-10-CM | POA: Diagnosis not present

## 2016-06-21 DIAGNOSIS — R51 Headache: Secondary | ICD-10-CM | POA: Diagnosis not present

## 2016-06-21 DIAGNOSIS — Z471 Aftercare following joint replacement surgery: Secondary | ICD-10-CM | POA: Diagnosis not present

## 2016-06-21 DIAGNOSIS — I5181 Takotsubo syndrome: Secondary | ICD-10-CM | POA: Diagnosis present

## 2016-06-21 DIAGNOSIS — S72001A Fracture of unspecified part of neck of right femur, initial encounter for closed fracture: Secondary | ICD-10-CM | POA: Diagnosis present

## 2016-06-21 DIAGNOSIS — M859 Disorder of bone density and structure, unspecified: Secondary | ICD-10-CM | POA: Diagnosis not present

## 2016-06-21 DIAGNOSIS — Z803 Family history of malignant neoplasm of breast: Secondary | ICD-10-CM

## 2016-06-21 DIAGNOSIS — M542 Cervicalgia: Secondary | ICD-10-CM | POA: Diagnosis not present

## 2016-06-21 DIAGNOSIS — M25552 Pain in left hip: Secondary | ICD-10-CM | POA: Diagnosis not present

## 2016-06-21 DIAGNOSIS — L89512 Pressure ulcer of right ankle, stage 2: Secondary | ICD-10-CM | POA: Diagnosis not present

## 2016-06-21 DIAGNOSIS — R638 Other symptoms and signs concerning food and fluid intake: Secondary | ICD-10-CM | POA: Diagnosis not present

## 2016-06-21 DIAGNOSIS — Z7401 Bed confinement status: Secondary | ICD-10-CM | POA: Diagnosis not present

## 2016-06-21 DIAGNOSIS — R262 Difficulty in walking, not elsewhere classified: Secondary | ICD-10-CM | POA: Diagnosis not present

## 2016-06-21 DIAGNOSIS — R1312 Dysphagia, oropharyngeal phase: Secondary | ICD-10-CM | POA: Diagnosis not present

## 2016-06-21 DIAGNOSIS — L89302 Pressure ulcer of unspecified buttock, stage 2: Secondary | ICD-10-CM | POA: Diagnosis present

## 2016-06-21 DIAGNOSIS — I209 Angina pectoris, unspecified: Secondary | ICD-10-CM | POA: Insufficient documentation

## 2016-06-21 DIAGNOSIS — L89152 Pressure ulcer of sacral region, stage 2: Secondary | ICD-10-CM | POA: Diagnosis not present

## 2016-06-21 DIAGNOSIS — S72091A Other fracture of head and neck of right femur, initial encounter for closed fracture: Secondary | ICD-10-CM | POA: Diagnosis not present

## 2016-06-21 LAB — CBC
HCT: 44.5 % (ref 35.0–47.0)
Hemoglobin: 15.5 g/dL (ref 12.0–16.0)
MCH: 32.5 pg (ref 26.0–34.0)
MCHC: 34.8 g/dL (ref 32.0–36.0)
MCV: 93.5 fL (ref 80.0–100.0)
PLATELETS: 150 10*3/uL (ref 150–440)
RBC: 4.76 MIL/uL (ref 3.80–5.20)
RDW: 14.1 % (ref 11.5–14.5)
WBC: 9.5 10*3/uL (ref 3.6–11.0)

## 2016-06-21 LAB — CK: CK TOTAL: 767 U/L — AB (ref 38–234)

## 2016-06-21 LAB — COMPREHENSIVE METABOLIC PANEL
ALT: 38 U/L (ref 14–54)
AST: 76 U/L — ABNORMAL HIGH (ref 15–41)
Albumin: 3.8 g/dL (ref 3.5–5.0)
Alkaline Phosphatase: 61 U/L (ref 38–126)
Anion gap: 11 (ref 5–15)
BUN: 41 mg/dL — ABNORMAL HIGH (ref 6–20)
CHLORIDE: 107 mmol/L (ref 101–111)
CO2: 24 mmol/L (ref 22–32)
Calcium: 10.6 mg/dL — ABNORMAL HIGH (ref 8.9–10.3)
Creatinine, Ser: 1.24 mg/dL — ABNORMAL HIGH (ref 0.44–1.00)
GFR calc non Af Amer: 40 mL/min — ABNORMAL LOW (ref >60)
GFR, EST AFRICAN AMERICAN: 46 mL/min — AB (ref >60)
Glucose, Bld: 132 mg/dL — ABNORMAL HIGH (ref 65–99)
POTASSIUM: 3.7 mmol/L (ref 3.5–5.1)
SODIUM: 142 mmol/L (ref 135–145)
Total Bilirubin: 1.1 mg/dL (ref 0.3–1.2)
Total Protein: 7 g/dL (ref 6.5–8.1)

## 2016-06-21 LAB — URINALYSIS COMPLETE WITH MICROSCOPIC (ARMC ONLY)
BILIRUBIN URINE: NEGATIVE
Bacteria, UA: NONE SEEN
Glucose, UA: 50 mg/dL — AB
KETONES UR: NEGATIVE mg/dL
LEUKOCYTES UA: NEGATIVE
Nitrite: NEGATIVE
PH: 6 (ref 5.0–8.0)
PROTEIN: 100 mg/dL — AB
SPECIFIC GRAVITY, URINE: 1.016 (ref 1.005–1.030)

## 2016-06-21 LAB — TROPONIN I
TROPONIN I: 3.58 ng/mL — AB (ref <0.03)
Troponin I: 2.88 ng/mL (ref <0.03)
Troponin I: 2.83 ng/mL (ref <0.03)

## 2016-06-21 LAB — LIPID PANEL
CHOL/HDL RATIO: 3.4 ratio
Cholesterol: 205 mg/dL — ABNORMAL HIGH (ref 0–200)
HDL: 60 mg/dL (ref >40)
LDL CALC: 120 mg/dL — AB (ref 0–99)
Triglycerides: 123 mg/dL (ref <150)
VLDL: 25 mg/dL (ref 0–40)

## 2016-06-21 LAB — APTT: aPTT: 26 seconds (ref 24–36)

## 2016-06-21 LAB — PROTIME-INR
INR: 1.06
Prothrombin Time: 13.8 seconds (ref 11.4–15.2)

## 2016-06-21 MED ORDER — MORPHINE SULFATE (PF) 2 MG/ML IV SOLN
1.0000 mg | INTRAVENOUS | Status: DC | PRN
  Administered 2016-06-23 – 2016-06-24 (×2): 1 mg via INTRAVENOUS
  Filled 2016-06-21 (×3): qty 1

## 2016-06-21 MED ORDER — SODIUM CHLORIDE 0.9 % IV BOLUS (SEPSIS)
1000.0000 mL | Freq: Once | INTRAVENOUS | Status: AC
  Administered 2016-06-21: 1000 mL via INTRAVENOUS

## 2016-06-21 MED ORDER — HEPARIN (PORCINE) IN NACL 100-0.45 UNIT/ML-% IJ SOLN
650.0000 [IU]/h | INTRAMUSCULAR | Status: DC
  Administered 2016-06-21: 550 [IU]/h via INTRAVENOUS
  Filled 2016-06-21 (×3): qty 250

## 2016-06-21 MED ORDER — SODIUM CHLORIDE 0.9 % IV SOLN
INTRAVENOUS | Status: DC
  Administered 2016-06-21 – 2016-06-25 (×7): via INTRAVENOUS

## 2016-06-21 MED ORDER — ASPIRIN EC 325 MG PO TBEC
DELAYED_RELEASE_TABLET | ORAL | Status: AC
  Filled 2016-06-21: qty 1

## 2016-06-21 MED ORDER — ASPIRIN EC 325 MG PO TBEC
325.0000 mg | DELAYED_RELEASE_TABLET | Freq: Every day | ORAL | Status: DC
  Administered 2016-06-21 – 2016-06-23 (×3): 325 mg via ORAL
  Filled 2016-06-21 (×2): qty 1

## 2016-06-21 MED ORDER — NITROGLYCERIN 0.4 MG SL SUBL: 0.4000 mg | SUBLINGUAL_TABLET | SUBLINGUAL | Status: DC | PRN

## 2016-06-21 MED ORDER — SODIUM CHLORIDE 0.9% FLUSH
3.0000 mL | Freq: Two times a day (BID) | INTRAVENOUS | Status: DC
  Administered 2016-06-21 – 2016-06-27 (×9): 3 mL via INTRAVENOUS

## 2016-06-21 MED ORDER — HEPARIN BOLUS VIA INFUSION
2750.0000 [IU] | Freq: Once | INTRAVENOUS | Status: AC
  Administered 2016-06-21: 2750 [IU] via INTRAVENOUS
  Filled 2016-06-21: qty 2750

## 2016-06-21 NOTE — ED Triage Notes (Signed)
Pt was last seen by her son on Thursday for a dr's appt. Pt was found on the floor in her bedroom today. Pt is uncertain when she fell, pt is soiled. Pt is c/o rt hip pain and dry mouth. Pt has completed treatment for throat cancer recently. Pt has a port to the rt chest and a feeding tube to the llq

## 2016-06-21 NOTE — H&P (Signed)
[facility] [place] NAME: Emily [name]    MR#:  [id number]  DATE OF BIRTH:  [birth year]  DATE OF ADMISSION:  06/21/2016  PRIMARY CARE PHYSICIAN: Lelon Huh, MD   REQUESTING/REFERRING PHYSICIAN: Harvest Dark  CHIEF COMPLAINT:   Chief Complaint  Patient presents with  . Fall    HISTORY OF PRESENT ILLNESS: Emily [name]  is a 82 y.o. female with a known history of Arthritis, throat cancer status post feeding tube in chemotherapy, colon cancer, hypertension, osteopenia- lives alone and has some episodes of forgetfulness for last few months. He was treated with radiation and feeding tube for throat cancer 5-6 month ago and since then her oncologist told that the cancer is completely resolved. Since then she has decreased overall eating and she is losing weight up until now. Son had visit to oncologist and primary care doctor for that, she was also started on Megace and Prozac, but not much help so she is finally scheduled to see a neurologist in 1-2 weeks. Last time son took her to doctor's appointment was Thursday, 3 days ago. Today when son and daughter-in-law went to her house they found her on the floor, they also saw some signs of her struggle to get up with the fallen down things and furniture around her. She looked completely confused at that time and could not give any more details to them. Son found her written note that she took the medications on Friday, which she usually writes down to remember and so most likely she did not fell down on Friday in the daytime. He suspected she might have been on the floor since Friday night up until today. She was found to be dehydrated, and her troponin was very high, right hip fracture found on x-ray.  ER physician spoke to her primary doctor and he agreed with starting on heparin IV drip and so given to hospitalist team for admission.  Patient is confused currently and to all my questions she Is  answering "I don't know".   History obtained from her son and daughter-in-law were present in the room.  PAST MEDICAL HISTORY:   Past Medical History:  Diagnosis Date  . Arthritis    fingers  . Cancer ([place])    rectal  . Cancer ([place]) 2012   colon cancer  . Cancer of contiguous sites of hypopharynx ([place]) 12/23/2015  . Colon cancer ([place])   . Difficult intubation   . Diverticulosis   . Hemorrhoids   . History of chicken pox   . Hypertension   . Osteopenia   . Status post chemotherapy    colon cancer  . Status post radiation therapy    colon cancer 2013    PAST SURGICAL HISTORY: Past Surgical History:  Procedure Laterality Date  . ABDOMINAL HYSTERECTOMY    . ABDOMINAL HYSTERECTOMY  1980   partial  . APPENDECTOMY    . APPENDECTOMY  [id number]   Dr. Pat Patrick  . BREAST CYST ASPIRATION Bilateral    negative  . BREAST SURGERY     cyst removal  . BREAST SURGERY  1960   Breast Biopsy  . COLON SURGERY  06/28/2013   resected tumor [facility] colon; [facility]  . COLONOSCOPY    . COLONOSCOPY N/A 10/01/2015   Procedure: COLONOSCOPY;  Surgeon: [name], MD;  Location: [facility] ENDOSCOPY;  Service: Endoscopy;  Laterality: N/A;  . DILATION AND CURETTAGE OF UTERUS  1957  . DIRECT LARYNGOSCOPY N/A  12/11/2015   Procedure: DIRECT LARYNGOSCOPY;  Surgeon: Clyde Canterbury, MD;  Location: [facility] ORS;  Service: ENT;  Laterality: N/A;  . ESOPHAGOSCOPY N/A 12/11/2015   Procedure: ESOPHAGOSCOPY;  Surgeon: Clyde Canterbury, MD;  Location: [facility] ORS;  Service: ENT;  Laterality: N/A;  . EUS N/A 01/20/2013   Procedure: LOWER ENDOSCOPIC ULTRASOUND (EUS);  Surgeon: Milus Banister, MD;  Location: [facility] ENDOSCOPY;  Service: Endoscopy;  Laterality: N/A;  . PEG PLACEMENT N/A 01/01/2016   Procedure: PERCUTANEOUS ENDOSCOPIC GASTROSTOMY (PEG) PLACEMENT;  Surgeon: [name], MD;  Location: [facility] ORS;  Service: General;  Laterality: N/A;  . PORTACATH PLACEMENT Right 01/01/2016   Procedure: INSERTION  PORT-A-CATH;  Surgeon: [name], MD;  Location: [facility] ORS;  Service: General;  Laterality: Right;  . TONSILLECTOMY      SOCIAL HISTORY:  Social History  Substance Use Topics  . Smoking status: Current Every Day Smoker    Packs/day: 1.00    Years: 64.00    Types: Cigarettes  . Smokeless tobacco: Never Used  . Alcohol use 0.0 oz/week     Comment: rarely    FAMILY HISTORY:  Family History  Problem Relation Age of Onset  . Kidney failure Father   . Breast cancer Maternal Grandmother     DRUG ALLERGIES:  Allergies  Allergen Reactions  . Amlodipine Besylate Other (See Comments)    Patient unaware of any allergy with this medicine  . Oxycodone     confusion    REVIEW OF SYSTEMS:   Patient is confused and cannot give a review of system.   MEDICATIONS AT HOME:  Prior to Admission medications   Medication Sig Start Date End Date Taking? Authorizing Provider  FLUoxetine (PROZAC) 10 MG tablet Take 1 tablet (10 mg total) by mouth daily. 06/02/16   [name], MD  megestrol (MEGACE) 40 MG tablet Take 1 tablet (40 mg total) by mouth 2 (two) times daily. 05/19/16   Lloyd Huger, MD  metoprolol succinate (TOPROL-XL) 25 MG 24 hr tablet TAKE ONE-HALF TABLET BY MOUTH ONCE DAILY* NEEDS OFFICE VISIT* 07/30/15   [name], MD  Nutritional Supplements (ENSURE PLUS PO) Take by mouth.    Historical Provider, MD  silver sulfADIAZINE (SILVADENE) 1 % cream Apply 1 application topically daily. 06/02/16 07/02/16  [name], MD      PHYSICAL EXAMINATION:   VITAL SIGNS: Blood pressure (!) 147/81, pulse 100, temperature 98 F (36.7 C), temperature source Oral, resp. rate 19, height 5\' 5"  (1.651 m), weight 46.4 kg (102 lb 4.8 oz), SpO2 98 %.  GENERAL:  82 y.o.-year-old patient lying in the bed with no acute distress.  EYES: Pupils equal, round, reactive to light and accommodation. No scleral icterus. Extraocular muscles intact.  HEENT: Head atraumatic, normocephalic.  Oropharynx and nasopharynx clear. Dry with thich mucous in mouth.  NECK:  Supple, no jugular venous distention. No thyroid enlargement, no tenderness.  LUNGS: Normal breath sounds bilaterally, no wheezing, rales,rhonchi or crepitation. No use of accessory muscles of respiration.  CARDIOVASCULAR: S1, S2 normal. No murmurs, rubs, or gallops.  ABDOMEN: Soft, nontender, nondistended. Bowel sounds present. No organomegaly or mass.  EXTREMITIES: No pedal edema, cyanosis, or clubbing. Right hip pain. NEUROLOGIC: Cranial nerves II through XII are intact. Muscle strength 3-4/5 in all extremities. Sensation intact. Gait not checked.  PSYCHIATRIC: The patient is alert andoriented to place but not able to give much details.Marland Kitchen  SKIN: No obvious rash, lesion, or ulcer.   LABORATORY PANEL:  CBC  Recent Labs Lab 06/21/16 1607  WBC 9.5  HGB 15.5  HCT 44.5  PLT 150  MCV 93.5  MCH 32.5  MCHC 34.8  RDW 14.1   ------------------------------------------------------------------------------------------------------------------  Chemistries   Recent Labs Lab 06/21/16 1607  NA 142  K 3.7  CL 107  CO2 24  GLUCOSE 132*  BUN 41*  CREATININE 1.24*  CALCIUM 10.6*  AST 76*  ALT 38  ALKPHOS 61  BILITOT 1.1   ------------------------------------------------------------------------------------------------------------------ estimated creatinine clearance is 26.5 mL/min (by C-G formula based on SCr of 1.24 mg/dL). ------------------------------------------------------------------------------------------------------------------ No results for input(s): TSH, T4TOTAL, T3FREE, THYROIDAB in the last 72 hours.  Invalid input(s): FREET3   Coagulation profile  Recent Labs Lab 06/21/16 1607  INR 1.06   ------------------------------------------------------------------------------------------------------------------- No results for input(s): DDIMER in the last 72  hours. -------------------------------------------------------------------------------------------------------------------  Cardiac Enzymes  Recent Labs Lab 06/21/16 1607  TROPONINI 2.88*   ------------------------------------------------------------------------------------------------------------------ Invalid input(s): POCBNP  ---------------------------------------------------------------------------------------------------------------  Urinalysis    Component Value Date/Time   COLORURINE YELLOW (A) 06/21/2016 1613   APPEARANCEUR CLEAR (A) 06/21/2016 1613   APPEARANCEUR Cloudy 01/30/2014 1528   LABSPEC 1.016 06/21/2016 1613   LABSPEC 1.017 01/30/2014 1528   PHURINE 6.0 06/21/2016 1613   GLUCOSEU 50 (A) 06/21/2016 1613   GLUCOSEU Negative 01/30/2014 1528   HGBUR 1+ (A) 06/21/2016 1613   BILIRUBINUR NEGATIVE 06/21/2016 1613   BILIRUBINUR Negative 01/30/2014 1528   KETONESUR NEGATIVE 06/21/2016 1613   PROTEINUR 100 (A) 06/21/2016 1613   NITRITE NEGATIVE 06/21/2016 1613   LEUKOCYTESUR NEGATIVE 06/21/2016 1613   LEUKOCYTESUR 3+ 01/30/2014 1528     RADIOLOGY: Dg Chest 1 View  Result Date: 06/21/2016 CLINICAL DATA:  Found down today. Right hip pain and dry mouth. Throat cancer. EXAM: CHEST 1 VIEW COMPARISON:  PET CT 05/14/2016.  Radiographs 01/01/2016. FINDINGS: 1702 hours. The heart size and mediastinal contours are stable. There is aortic atherosclerosis. Right subclavian Port-A-Cath tip extends to the SVC right atrial level. The lungs are clear. There is no pleural effusion or pneumothorax. No evidence of residual pneumoperitoneum. Stable old rib fractures or thoracotomy defects bilaterally. Percutaneous G-tube noted. IMPRESSION: No active cardiopulmonary process. Electronically Signed   By: Richardean Sale M.D.   On: 06/21/2016 17:24   Ct Head Wo Contrast  Result Date: 06/21/2016 CLINICAL DATA:  82 year old female found down. Pain and dehydration. Recently completed  treatment for throat cancer. Initial encounter. EXAM: CT HEAD WITHOUT CONTRAST CT CERVICAL SPINE WITHOUT CONTRAST TECHNIQUE: Multidetector CT imaging of the head and cervical spine was performed following the standard protocol without intravenous contrast. Multiplanar CT image reconstructions of the cervical spine were also generated. COMPARISON:  PET-CT 05/14/2016 and earlier. FINDINGS: CT HEAD FINDINGS Small volume retained secretions in the nasopharynx. Visualized paranasal sinuses and mastoids are well pneumatized. Calvarium intact. Osteopenia. No acute orbits soft tissue finding. No scalp hematoma identified. Extensive Calcified atherosclerosis at the skull base. Cerebral volume is within normal limits for age. Mild for age patchy bilateral nonspecific white matter hypodensity. No midline shift, ventriculomegaly, mass effect, evidence of mass lesion, intracranial hemorrhage or evidence of cortically based acute infarction. No suspicious intracranial vascular hyperdensity. CT CERVICAL SPINE FINDINGS Preserved cervical lordosis. Visualized skull base is intact. No atlanto-occipital dissociation. Cervicothoracic junction alignment is within normal limits. Bilateral posterior element alignment is within normal limits. Mild degenerative appearing spondylolisthesis at C5-C6 where there is chronic severe disc space loss and endplate spurring. No cervical spine fracture identified. Multilevel mild degenerative cervical spinal stenosis suspected. Visible upper thoracic  levels appear intact. Negative lung apices aside from mild scarring. Calcified carotid atherosclerosis in the neck. Small volume retained secretions in the hypopharynx. Otherwise negative noncontrast neck soft tissues. IMPRESSION: 1. No acute intracranial abnormality. Mild for age cerebral white matter changes most commonly due to chronic small vessel disease. 2. No acute fracture or listhesis identified in the cervical spine. Ligamentous injury is not  excluded. 3. Mild retained secretions in the pharynx. Electronically Signed   By: Genevie Ann M.D.   On: 06/21/2016 17:39   Ct Cervical Spine Wo Contrast  Result Date: 06/21/2016 CLINICAL DATA:  82 year old female found down. Pain and dehydration. Recently completed treatment for throat cancer. Initial encounter. EXAM: CT HEAD WITHOUT CONTRAST CT CERVICAL SPINE WITHOUT CONTRAST TECHNIQUE: Multidetector CT imaging of the head and cervical spine was performed following the standard protocol without intravenous contrast. Multiplanar CT image reconstructions of the cervical spine were also generated. COMPARISON:  PET-CT 05/14/2016 and earlier. FINDINGS: CT HEAD FINDINGS Small volume retained secretions in the nasopharynx. Visualized paranasal sinuses and mastoids are well pneumatized. Calvarium intact. Osteopenia. No acute orbits soft tissue finding. No scalp hematoma identified. Extensive Calcified atherosclerosis at the skull base. Cerebral volume is within normal limits for age. Mild for age patchy bilateral nonspecific white matter hypodensity. No midline shift, ventriculomegaly, mass effect, evidence of mass lesion, intracranial hemorrhage or evidence of cortically based acute infarction. No suspicious intracranial vascular hyperdensity. CT CERVICAL SPINE FINDINGS Preserved cervical lordosis. Visualized skull base is intact. No atlanto-occipital dissociation. Cervicothoracic junction alignment is within normal limits. Bilateral posterior element alignment is within normal limits. Mild degenerative appearing spondylolisthesis at C5-C6 where there is chronic severe disc space loss and endplate spurring. No cervical spine fracture identified. Multilevel mild degenerative cervical spinal stenosis suspected. Visible upper thoracic levels appear intact. Negative lung apices aside from mild scarring. Calcified carotid atherosclerosis in the neck. Small volume retained secretions in the hypopharynx. Otherwise negative  noncontrast neck soft tissues. IMPRESSION: 1. No acute intracranial abnormality. Mild for age cerebral white matter changes most commonly due to chronic small vessel disease. 2. No acute fracture or listhesis identified in the cervical spine. Ligamentous injury is not excluded. 3. Mild retained secretions in the pharynx. Electronically Signed   By: Genevie Ann M.D.   On: 06/21/2016 17:39   Dg Hip Unilat W Or Wo Pelvis 2-3 Views Right  Result Date: 06/21/2016 CLINICAL DATA:  Pain after fall EXAM: DG HIP (WITH OR WITHOUT PELVIS) 2-3V RIGHT COMPARISON:  None. FINDINGS: There is a fracture through the right femoral neck. Laterally and superiorly, the fracture extends into the subcapital region. No dislocation identified. No other acute abnormalities. IMPRESSION: Fracture through the right femoral neck extending into the subcapital region superiorly and laterally. No dislocation. Electronically Signed   By: Dorise Bullion III M.D   On: 06/21/2016 17:18    EKG: Orders placed or performed during the hospital encounter of 06/21/16  . ED EKG  . ED EKG  . EKG 12-Lead  . EKG 12-Lead    IMPRESSION AND PLAN:  * Non-ST elevation MI   As she has high troponin, was started on heparin IV drip currently.   We'll follow serial troponin, monitor on telemetry.   Check lipid panel and echocardiogram.   Started on aspirin orally.   Cardiology consult for further management.  * Right hip fracture   She will need orthopedic surgery, but before that she will need a cardiology clearance.   I will give him morphine IV for  pain control meanwhile.   Cardiology consult was called in, to help with further workup.  * Decreased oral intake  continue megestrol and Prozac.  We may have to get dietary consult after surgery.  * hypertension   Metoprolol oral.   Hold it for now while waiting for surgery as patient is dehydrated.     All the records are reviewed and case discussed with ED provider. Management plans  discussed with the patient, family and they are in agreement.  CODE STATUS: DO NOT RESUSCITATE    Patient's power of attorney is her son who was present in the room during my visit.  Code Status History    This patient does not have a recorded code status. Please follow your organizational policy for patients in this situation.    Advance Directive Documentation   Flowsheet Row Most Recent Value  Type of Advance Directive  Healthcare Power of Attorney  Pre-existing out of facility DNR order (yellow form or pink MOST form)  No data  "MOST" Form in Place?  No data       TOTAL TIME TAKING CARE OF THIS PATIENT: 50  minutes.    Vaughan Basta M.D on 06/21/2016   Between 7am to 6pm - Pager - [phone]  After 6pm go to [email or web address] - password [email or web address] [facility]  Office  [phone]  CC: Primary care physician; Lelon Huh, MD   Note: This dictation was prepared with Dragon dictation along with smaller phrase technology. Any transcriptional errors that result from this process are unintentional.

## 2016-06-21 NOTE — Consult Note (Signed)
Patient is an unfortunate 82 year old white female suffered a fall probably 2 days ago and is been on the ground since then. She is a poor historian and does not recall what made her fall. She has a shortened and actually rotated right lower extremity and x-rays showed a displaced femoral neck fracture with osteoarthritis. She also appears to have had MI as well with elevated troponin and will prior to wait a few days before having surgery to optimize her medically  Physical exam the right leg is as noted shortened and external rotated skin is intact about the anterior and lateral hip her back was not visualized. She is able flex extend the toes on both ankles and has a trace dorsalis pedis pulse  Radiographs are reviewed and show a completely displaced right femoral neck fracture with central osteoarthritis present  Impression is frail 82 year old white female with displaced femoral neck fracture that normally be treated with hemiarthroplasty her hip replacement but first we'll need medical cement optimized for treatment surgery that she is in poor condition at present.

## 2016-06-21 NOTE — Plan of Care (Signed)
Problem: Activity: Goal: Ability to tolerate increased activity will improve Outcome: Not Progressing Patient has a fractured hip, exhibit signs of pain with movement but refuses pain medication.

## 2016-06-21 NOTE — ED Provider Notes (Signed)
[facility] Emergency Department Provider Note  Time seen: 4:43 PM  I have reviewed the triage vital signs and the nursing notes.   HISTORY  Chief Complaint Fall    HPI Emily [name] is a 82 y.o. female with a past medical history of rectal cancer, throat cancer, status post chemotherapy supposedly in remission, hypertension, osteopenia, who presents the emergency department after a fall. According to the son the patient lives independently in her own apartment. They attempted to call her yesterday but could not reach her, they attempted multiple times today and cannot reach her says they went to the apartment and found her on the floor in her bedroom. The patient does not recall falling. She has very dry mucous membranes appears to be significantly dehydrated. The patient was unable to get up with help from the family due to right hip pain. Patient appeared to be confused, denies any pain at this time, but does have significant pain with attempted right hip movement.  Past Medical History:  Diagnosis Date  . Arthritis    fingers  . Cancer ([place])    rectal  . Cancer ([place]) 2012   colon cancer  . Cancer of contiguous sites of hypopharynx ([place]) 12/23/2015  . Colon cancer ([place])   . Difficult intubation   . Diverticulosis   . Hemorrhoids   . History of chicken pox   . Hypertension   . Osteopenia   . Status post chemotherapy    colon cancer  . Status post radiation therapy    colon cancer 2013    Patient Active Problem List   Diagnosis Date Noted  . Dysphagia 06/02/2016  . 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 Cancer of contiguous sites of hypopharynx ([place]) 12/23/2015  . Throat pain 11/27/2015  . History of colon cancer 05/09/2015  . Diverticulosis 05/09/2015  . Fatigue 05/09/2015  . Hemorrhoid 05/09/2015  . Hypertension 05/09/2015  . Insomnia 05/09/2015  . Osteopenia 05/09/2015  . Skin lesion of back 05/09/2015  . Tobacco abuse  05/09/2015  . Vitamin D deficiency 05/09/2015  . Ileostomy present ([place]) 09/27/2013  . Acid reflux 06/13/2013  . Arthritis, degenerative 06/13/2013  . Disuse syndrome 06/13/2013  . Malignant neoplasm of rectum ([place]) 01/31/2013  . Rectal cancer ([place]) 01/20/2013    Past Surgical History:  Procedure Laterality Date  . ABDOMINAL HYSTERECTOMY    . ABDOMINAL HYSTERECTOMY  1980   partial  . APPENDECTOMY    . APPENDECTOMY  [id number]   Dr. Pat Patrick  . BREAST CYST ASPIRATION Bilateral    negative  . BREAST SURGERY     cyst removal  . BREAST SURGERY  1960   Breast Biopsy  . COLON SURGERY  06/28/2013   resected tumor from colon; [facility]  . COLONOSCOPY    . COLONOSCOPY N/A 10/01/2015   Procedure: COLONOSCOPY;  Surgeon: [name], MD;  Location: [facility] ENDOSCOPY;  Service: Endoscopy;  Laterality: N/A;  . DILATION AND CURETTAGE OF UTERUS  1957  . DIRECT LARYNGOSCOPY N/A 12/11/2015   Procedure: DIRECT LARYNGOSCOPY;  Surgeon: Clyde Canterbury, MD;  Location: [facility] ORS;  Service: ENT;  Laterality: N/A;  . ESOPHAGOSCOPY N/A 12/11/2015   Procedure: ESOPHAGOSCOPY;  Surgeon: Clyde Canterbury, MD;  Location: [facility] ORS;  Service: ENT;  Laterality: N/A;  . EUS N/A 01/20/2013   Procedure: LOWER ENDOSCOPIC ULTRASOUND (EUS);  Surgeon: Milus Banister, MD;  Location: [facility] ENDOSCOPY;  Service: Endoscopy;  Laterality: N/A;  . PEG PLACEMENT  N/A 01/01/2016   Procedure: PERCUTANEOUS ENDOSCOPIC GASTROSTOMY (PEG) PLACEMENT;  Surgeon: [name], MD;  Location: [facility] ORS;  Service: General;  Laterality: N/A;  . PORTACATH PLACEMENT Right 01/01/2016   Procedure: INSERTION PORT-A-CATH;  Surgeon: [name], MD;  Location: [facility] ORS;  Service: General;  Laterality: Right;  . TONSILLECTOMY      Prior to Admission medications   Medication Sig Start Date End Date Taking? Authorizing Provider  FLUoxetine (PROZAC) 10 MG tablet Take 1 tablet (10 mg total) by mouth daily. 06/02/16   [name], MD   megestrol (MEGACE) 40 MG tablet Take 1 tablet (40 mg total) by mouth 2 (two) times daily. 05/19/16   Lloyd Huger, MD  metoprolol succinate (TOPROL-XL) 25 MG 24 hr tablet TAKE ONE-HALF TABLET BY MOUTH ONCE DAILY* NEEDS OFFICE VISIT* 07/30/15   [name], MD  Nutritional Supplements (ENSURE PLUS PO) Take by mouth.    Historical Provider, MD  silver sulfADIAZINE (SILVADENE) 1 % cream Apply 1 application topically daily. 06/02/16 07/02/16  [name], MD    Allergies  Allergen Reactions  . Amlodipine Besylate Other (See Comments)    Patient unaware of any allergy with this medicine  . Oxycodone     confusion    Family History  Problem Relation Age of Onset  . Kidney failure Father   . Breast cancer Maternal Grandmother     Social History Social History  Substance Use Topics  . Smoking status: Current Every Day Smoker    Packs/day: 1.00    Years: 64.00    Types: Cigarettes  . Smokeless tobacco: Never Used  . Alcohol use 0.0 oz/week     Comment: rarely    Review of Systems Constitutional: Negative for fever. Cardiovascular: Negative for chest pain. Respiratory: Negative for shortness of breath. Gastrointestinal: Negative for abdominal pain Musculoskeletal: Right hip pain Neurological: Negative for headache 10-point ROS otherwise negative.  ____________________________________________   PHYSICAL EXAM:  VITAL SIGNS: ED Triage Vitals  Enc Vitals Group     BP 06/21/16 1558 (!) 143/101     Pulse Rate 06/21/16 1558 98     Resp 06/21/16 1558 18     Temp 06/21/16 1558 98 F (36.7 C)     Temp Source 06/21/16 1558 Oral     SpO2 06/21/16 1558 98 %     Weight 06/21/16 1559 102 lb 4.8 oz (46.4 kg)     Height --      Head Circumference --      Peak Flow --      Pain Score 06/21/16 1600 7     Pain Loc --      Pain Edu? --      Excl. in [place]? --     Constitutional: Alert, confused about what happened, is oriented to place and person. Eyes: Normal  exam ENT   Head: Normocephalic and atraumatic.   Mouth/Throat: Very dry mucous membranes. Cardiovascular: Normal rate, regular rhythm. No murmur Respiratory: Normal respiratory effort without tachypnea nor retractions. Breath sounds are clear  Gastrointestinal: Soft and nontender. No distention. G-tube present. Musculoskeletal: Significant pain with attempted right hip range of motion. Mild tenderness to right hip palpation. Neurovascular intact. Neurologic:  Normal speech and language. No gross focal neurologic deficits are appreciated. Skin:  Skin is warm, dry and intact.  Psychiatric: Appears overall weak, answers questions appropriately, somewhat flat affect.  ____________________________________________    EKG  EKG reviewed and interpreted by myself shows sinus tachycardia at  96 bpm, widened QRS, normal axis, sharp T-wave inversions in V4, V5. No ST elevation.  Significant change from February 2017.  ____________________________________________    RADIOLOGY  X-ray consistent with right hip fracture. Chest x-ray clear.    ____________________________________________   INITIAL IMPRESSION / ASSESSMENT AND PLAN / ED COURSE  Pertinent labs & imaging results that were available during my care of the patient were reviewed by me and considered in my medical decision making (see chart for details).  The patient presents the emergency department after a fall, being found down for an unknown time period with right hip pain and very dry mucous membranes. We'll check labs including a CK and troponin. We'll IV hydrate, obtain a CT scan of the head, neck, and right hip x-ray. I anticipated admission for the patient.   Patient is labs shows significantly elevated troponin. Right hip x-ray is consistent with a fracture. Chest x-ray is clear. Patient's EKG shows sharp T-wave inversions in V4, V5 possibly concerning for neurogenic T waves. Currently awaiting CT head/C-spine results. I  discussed the patient with Dr. Rudene Christians, patient will be admitted to medical service for treatment, orthopedic will be involved as consultants and will operate once the patient is medically stable.  EKG is significantly changed from prior, CT scan is read as negative. Suspect likely cardiac event causing the fall leading to right hip fracture. We will discuss heparin with orthopedics, as long as it is safe from a orthopedic standpoint we'll start the patient on a heparin drip admitted to the hospital for an NSTEMI with orthopedic consultation.   CRITICAL CARE Performed by: Harvest Dark   Total critical care time: 30 minutes  Critical care time was exclusive of separately billable procedures and treating other patients.  Critical care was necessary to treat or prevent imminent or life-threatening deterioration.  Critical care was time spent personally by me on the following activities: development of treatment plan with patient and/or surrogate as well as nursing, discussions with consultants, evaluation of patient's response to treatment, examination of patient, obtaining history from patient or surrogate, ordering and performing treatments and interventions, ordering and review of laboratory studies, ordering and review of radiographic studies, pulse oximetry and re-evaluation of patient's condition.   ____________________________________________   FINAL CLINICAL IMPRESSION(S) / ED DIAGNOSES  NSTEMI Right hip fracture   Harvest Dark, MD 06/21/16 1745

## 2016-06-21 NOTE — Progress Notes (Signed)
ANTICOAGULATION CONSULT NOTE - Initial Consult  Pharmacy Consult for Heparin Drip Indication: chest pain/ACS  Allergies  Allergen Reactions  . Amlodipine Besylate Other (See Comments)    Patient unaware of any allergy with this medicine  . Oxycodone     confusion    Patient Measurements: Height: 5\' 5"  (165.1 cm) Weight: 102 lb 4.8 oz (46.4 kg) IBW/kg (Calculated) : 57 Heparin Dosing Weight: 46.4 kg  Vital Signs: Temp: 98 F (36.7 C) (08/20 1558) Temp Source: Oral (08/20 1558) BP: 147/81 (08/20 1630) Pulse Rate: 100 (08/20 1630)  Labs:  Recent Labs  06/21/16 1607 06/21/16 1830  HGB 15.5  --   HCT 44.5  --   PLT 150  --   APTT 26  --   LABPROT 13.8  --   INR 1.06  --   CREATININE 1.24*  --   CKTOTAL 767*  --   TROPONINI 2.88* 3.58*    Estimated Creatinine Clearance: 26.5 mL/min (by C-G formula based on SCr of 1.24 mg/dL).   Medical History: Past Medical History:  Diagnosis Date  . Arthritis    fingers  . Cancer ([place])    rectal  . Cancer ([place]) 2012   colon cancer  . Cancer of contiguous sites of hypopharynx ([place]) 12/23/2015  . Colon cancer ([place])   . Difficult intubation   . Diverticulosis   . Hemorrhoids   . History of chicken pox   . Hypertension   . Osteopenia   . Status post chemotherapy    colon cancer  . Status post radiation therapy    colon cancer 2013    Medications:   (Not in a hospital admission)  Assessment: Patient is admitted for hip fracture and NSTEMI to be initiated on Heparin infusion. Reviewed labs and PTA meds.  Goal of Therapy:  Heparin level 0.3-0.7 units/ml Monitor platelets by anticoagulation protocol: Yes   Plan:  Give 2750 units bolus x 1 Start heparin infusion at 550 units/hr Check anti-Xa level in 8 hours and daily while on heparin Continue to monitor H&H and platelets  Paulina Fusi, PharmD, BCPS 06/21/2016 7:25 PM

## 2016-06-22 ENCOUNTER — Inpatient Hospital Stay
Admit: 2016-06-22 | Discharge: 2016-06-22 | Disposition: A | Discharge status: Home / Self Care | Payer: Medicare Other | Attending: Internal Medicine | Admitting: Internal Medicine

## 2016-06-22 LAB — ECHOCARDIOGRAM COMPLETE
HEIGHTINCHES: 65 in
WEIGHTICAEL: 1636.8 [oz_av]

## 2016-06-22 LAB — CBC
HCT: 38.9 % (ref 35.0–47.0)
Hemoglobin: 13.2 g/dL (ref 12.0–16.0)
MCH: 31.8 pg (ref 26.0–34.0)
MCHC: 34.1 g/dL (ref 32.0–36.0)
MCV: 93.3 fL (ref 80.0–100.0)
PLATELETS: 123 10*3/uL — AB (ref 150–440)
RBC: 4.17 MIL/uL (ref 3.80–5.20)
RDW: 13.8 % (ref 11.5–14.5)
WBC: 8 10*3/uL (ref 3.6–11.0)

## 2016-06-22 LAB — HEPARIN LEVEL (UNFRACTIONATED)
HEPARIN UNFRACTIONATED: 0.41 [IU]/mL (ref 0.30–0.70)
Heparin Unfractionated: 0.38 IU/mL (ref 0.30–0.70)

## 2016-06-22 LAB — BASIC METABOLIC PANEL
Anion gap: 7 (ref 5–15)
BUN: 37 mg/dL — AB (ref 6–20)
CALCIUM: 9.1 mg/dL (ref 8.9–10.3)
CO2: 22 mmol/L (ref 22–32)
CREATININE: 0.92 mg/dL (ref 0.44–1.00)
Chloride: 114 mmol/L — ABNORMAL HIGH (ref 101–111)
GFR, EST NON AFRICAN AMERICAN: 57 mL/min — AB (ref >60)
Glucose, Bld: 106 mg/dL — ABNORMAL HIGH (ref 65–99)
Potassium: 3 mmol/L — ABNORMAL LOW (ref 3.5–5.1)
SODIUM: 143 mmol/L (ref 135–145)

## 2016-06-22 LAB — TROPONIN I: Troponin I: 2.63 ng/mL (ref <0.03)

## 2016-06-22 MED ORDER — ENSURE ENLIVE PO LIQD: 1.0000 | Freq: Every day | ORAL | Status: DC

## 2016-06-22 MED ORDER — FLUOXETINE HCL 10 MG PO CAPS
10.0000 mg | ORAL_CAPSULE | Freq: Every day | ORAL | Status: DC
  Administered 2016-06-22 – 2016-06-27 (×5): 10 mg via ORAL
  Filled 2016-06-22 (×6): qty 1

## 2016-06-22 MED ORDER — ACETAMINOPHEN 325 MG PO TABS
650.0000 mg | ORAL_TABLET | Freq: Four times a day (QID) | ORAL | Status: DC | PRN
Start: 2016-06-22 — End: 2016-06-27
  Administered 2016-06-22: 650 mg via ORAL
  Filled 2016-06-22: qty 2

## 2016-06-22 MED ORDER — METOPROLOL SUCCINATE ER 25 MG PO TB24
25.0000 mg | ORAL_TABLET | Freq: Every day | ORAL | Status: DC
  Administered 2016-06-22 – 2016-06-24 (×3): 25 mg via ORAL
  Filled 2016-06-22 (×3): qty 1

## 2016-06-22 MED ORDER — MEGESTROL ACETATE 40 MG PO TABS
40.0000 mg | ORAL_TABLET | Freq: Two times a day (BID) | ORAL | Status: DC
  Administered 2016-06-22 – 2016-06-27 (×10): 40 mg via ORAL
  Filled 2016-06-22 (×12): qty 1

## 2016-06-22 MED ORDER — POTASSIUM CHLORIDE 20 MEQ/15ML (10%) PO SOLN
40.0000 meq | Freq: Once | ORAL | Status: AC
  Administered 2016-06-22: 40 meq via ORAL
  Filled 2016-06-22: qty 30

## 2016-06-22 NOTE — Progress Notes (Signed)
*  PRELIMINARY RESULTS* Echocardiogram 2D Echocardiogram has been performed.  Emily [name] 06/22/2016, 12:51 PM

## 2016-06-22 NOTE — Plan of Care (Signed)
Problem: Nutrition: Goal: Adequate nutrition will be maintained Outcome: Not Progressing Patient has been ordered megace to help with appetite.

## 2016-06-22 NOTE — Consult Note (Signed)
[facility] Consultation Note  Patient ID: Emily [name], MRN: [id number], DOB/AGE: 1933-06-03 82 y.o. Admit date: 06/21/2016   Date of Consult: 06/22/2016 Primary Physician: Lelon Huh, MD Primary Cardiologist:None  Chief Complaint:  Chief Complaint  Patient presents with  . Fall   Reason for Consult: non-ST elevation myocardial infarction  HPI: 82 y.o. female with no previous history of cardiovascular disease but does have a history of cancer and essential hypertension. The patient did have an episode of trauma when she fell and had a femur fracture. At that time the patient was brought to the hospital but had some chest discomfort and other significant symptoms with an EKG showing anterior T-wave inversions in a diffuse manner with an elevated troponin of 3.5 consistent with non-ST elevation myocardial infarction. 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 The patient now is on appropriate medication management including metoprolol as well as aspirin and heparin and is stable. There is a further concerns that the patient will need surgical intervention of her femur fracture and therefore will need further evaluation and treatment. Currently the patient does not have any symptoms of chest pain  Past Medical History:  Diagnosis Date  . Arthritis    fingers  . Cancer ([place])    rectal  . Cancer ([place]) 2012   colon cancer  . Cancer of contiguous sites of hypopharynx ([place]) 12/23/2015  . Colon cancer ([place])   . Difficult intubation   . Diverticulosis   . Hemorrhoids   . History of chicken pox   . Hypertension   . Osteopenia   . Status post chemotherapy    colon cancer  . Status post radiation therapy    colon cancer 2013      Surgical History:  Past Surgical History:  Procedure Laterality Date  . ABDOMINAL HYSTERECTOMY    . ABDOMINAL  HYSTERECTOMY  1980   partial  . APPENDECTOMY    . APPENDECTOMY  [id number]   Dr. Pat Patrick  . BREAST CYST ASPIRATION Bilateral    negative  . BREAST SURGERY     cyst removal  . BREAST SURGERY  1960   Breast Biopsy  . COLON SURGERY  06/28/2013   resected tumor from colon; [facility]  . COLONOSCOPY    . COLONOSCOPY N/A 10/01/2015   Procedure: COLONOSCOPY;  Surgeon: [name], MD;  Location: [facility] ENDOSCOPY;  Service: Endoscopy;  Laterality: N/A;  . DILATION AND CURETTAGE OF UTERUS  1957  . DIRECT LARYNGOSCOPY N/A 12/11/2015   Procedure: DIRECT LARYNGOSCOPY;  Surgeon: Clyde Canterbury, MD;  Location: [facility] ORS;  Service: ENT;  Laterality: N/A;  . ESOPHAGOSCOPY N/A 12/11/2015   Procedure: ESOPHAGOSCOPY;  Surgeon: Clyde Canterbury, MD;  Location: [facility] ORS;  Service: ENT;  Laterality: N/A;  . EUS N/A 01/20/2013   Procedure: LOWER ENDOSCOPIC ULTRASOUND (EUS);  Surgeon: Milus Banister, MD;  Location: [facility] ENDOSCOPY;  Service: Endoscopy;  Laterality: N/A;  . PEG PLACEMENT N/A 01/01/2016   Procedure: PERCUTANEOUS ENDOSCOPIC GASTROSTOMY (PEG) PLACEMENT;  Surgeon: [name], MD;  Location: [facility] ORS;  Service: General;  Laterality: N/A;  . PORTACATH PLACEMENT Right 01/01/2016   Procedure: INSERTION PORT-A-CATH;  Surgeon: [name], MD;  Location: [facility] ORS;  Service: General;  Laterality: Right;  . TONSILLECTOMY       Home Meds: Prior to Admission medications   Medication Sig Start Date End Date Taking? Authorizing Provider  FLUoxetine (PROZAC) 10 MG tablet Take 1 tablet (10 mg total) by mouth daily. 06/02/16  Yes [name], MD  megestrol (MEGACE) 40 MG tablet Take 1 tablet (40 mg total) by mouth 2 (two) times daily. 05/19/16  Yes Lloyd Huger, MD  metoprolol succinate (TOPROL-XL) 25 MG 24 hr tablet TAKE ONE-HALF TABLET BY MOUTH ONCE DAILY* NEEDS OFFICE VISIT* 07/30/15  Yes [name], MD  Nutritional Supplements (ENSURE PLUS PO) Take 1 Can by mouth daily.    Yes  Historical Provider, MD  nystatin (MYCOSTATIN) 100000 UNIT/ML suspension Take 5 mLs by mouth 4 (four) times daily. X 14 days. 06/19/16  Yes Historical Provider, MD    Inpatient Medications:  . aspirin EC  325 mg Oral Daily  . feeding supplement (ENSURE ENLIVE)  1 Bottle Oral Daily  . FLUoxetine  10 mg Oral Daily  . megestrol  40 mg Oral BID  . metoprolol succinate  25 mg Oral Daily  . sodium chloride flush  3 mL Intravenous Q12H   . sodium chloride 125 mL/hr at 06/22/16 1023  . heparin 550 Units/hr (06/21/16 1845)    Allergies:  Allergies  Allergen Reactions  . Amlodipine Besylate Other (See Comments)    Patient unaware of any allergy with this medicine  . Oxycodone     confusion    Social History   Social History  . Marital status: Widowed    Spouse name: N/A  . Number of children: 3  . Years of education: N/A   Occupational History  . Unemployed     Homemaker   Social History Main Topics  . Smoking status: Current Every Day Smoker    Packs/day: 1.00    Years: 64.00    Types: Cigarettes  . Smokeless tobacco: Never Used  . Alcohol use 0.0 oz/week     Comment: rarely  . Drug use: No  . Sexual activity: Not on file   Other Topics Concern  . Not on file   Social History Narrative   ** Merged History Encounter **         Family History  Problem Relation Age of Onset  . Kidney failure Father   . Breast cancer Maternal Grandmother      Review of Systems Positive forShortness of breath leg pain Negative for: General:  chills, fever, night sweats or weight changes.  Cardiovascular: PND orthopnea syncope dizziness  Dermatological skin lesions rashes Respiratory: Cough congestion Urologic: Frequent urination urination at night and hematuria Abdominal: negative for nausea, vomiting, diarrhea, bright red blood per rectum, melena, or hematemesis Neurologic: negative for visual changes, and/or hearing changes  All other systems reviewed and are otherwise  negative except as noted above.  Labs:  Recent Labs  06/21/16 1607 06/21/16 1830 06/21/16 2254 06/22/16 0238  CKTOTAL 767*  --   --   --   TROPONINI 2.88* 3.58* 2.83* 2.63*   Lab Results  Component Value Date   WBC 8.0 06/22/2016   HGB 13.2 06/22/2016   HCT 38.9 06/22/2016   MCV 93.3 06/22/2016   PLT 123 (L) 06/22/2016    Recent Labs Lab 06/21/16 1607 06/22/16 0238  NA 142 143  K 3.7 3.0*  CL 107 114*  CO2 24 22  BUN 41* 37*  CREATININE 1.24* 0.92  CALCIUM 10.6* 9.1  PROT 7.0  --   BILITOT 1.1  --   ALKPHOS 61  --   ALT 38  --   AST 76*  --   GLUCOSE  132* 106*   Lab Results  Component Value Date   CHOL 205 (H) 06/21/2016   HDL 60 06/21/2016   LDLCALC 120 (H) 06/21/2016   TRIG 123 06/21/2016   No results found for: DDIMER  Radiology/Studies:  Dg Chest 1 View  Result Date: 06/21/2016 CLINICAL DATA:  Found down today. Right hip pain and dry mouth. Throat cancer. EXAM: CHEST 1 VIEW COMPARISON:  PET CT 05/14/2016.  Radiographs 01/01/2016. FINDINGS: 1702 hours. The heart size and mediastinal contours are stable. There is aortic atherosclerosis. Right subclavian Port-A-Cath tip extends to the SVC right atrial level. The lungs are clear. There is no pleural effusion or pneumothorax. No evidence of residual pneumoperitoneum. Stable old rib fractures or thoracotomy defects bilaterally. Percutaneous G-tube noted. IMPRESSION: No active cardiopulmonary process. Electronically Signed   By: Richardean Sale M.D.   On: 06/21/2016 17:24   Ct Head Wo Contrast  Result Date: 06/21/2016 CLINICAL DATA:  82 year old female found down. Pain and dehydration. Recently completed treatment for throat cancer. Initial encounter. EXAM: CT HEAD WITHOUT CONTRAST CT CERVICAL SPINE WITHOUT CONTRAST TECHNIQUE: Multidetector CT imaging of the head and cervical spine was performed following the standard protocol without intravenous contrast. Multiplanar CT image reconstructions of the cervical spine  were also generated. COMPARISON:  PET-CT 05/14/2016 and earlier. FINDINGS: CT HEAD FINDINGS Small volume retained secretions in the nasopharynx. Visualized paranasal sinuses and mastoids are well pneumatized. Calvarium intact. Osteopenia. No acute orbits soft tissue finding. No scalp hematoma identified. Extensive Calcified atherosclerosis at the skull base. Cerebral volume is within normal limits for age. Mild for age patchy bilateral nonspecific white matter hypodensity. No midline shift, ventriculomegaly, mass effect, evidence of mass lesion, intracranial hemorrhage or evidence of cortically based acute infarction. No suspicious intracranial vascular hyperdensity. CT CERVICAL SPINE FINDINGS Preserved cervical lordosis. Visualized skull base is intact. No atlanto-occipital dissociation. Cervicothoracic junction alignment is within normal limits. Bilateral posterior element alignment is within normal limits. Mild degenerative appearing spondylolisthesis at C5-C6 where there is chronic severe disc space loss and endplate spurring. No cervical spine fracture identified. Multilevel mild degenerative cervical spinal stenosis suspected. Visible upper thoracic levels appear intact. Negative lung apices aside from mild scarring. Calcified carotid atherosclerosis in the neck. Small volume retained secretions in the hypopharynx. Otherwise negative noncontrast neck soft tissues. IMPRESSION: 1. No acute intracranial abnormality. Mild for age cerebral white matter changes most commonly due to chronic small vessel disease. 2. No acute fracture or listhesis identified in the cervical spine. Ligamentous injury is not excluded. 3. Mild retained secretions in the pharynx. Electronically Signed   By: Genevie Ann M.D.   On: 06/21/2016 17:39   Ct Cervical Spine Wo Contrast  Result Date: 06/21/2016 CLINICAL DATA:  82 year old female found down. Pain and dehydration. Recently completed treatment for throat cancer. Initial encounter.  EXAM: CT HEAD WITHOUT CONTRAST CT CERVICAL SPINE WITHOUT CONTRAST TECHNIQUE: Multidetector CT imaging of the head and cervical spine was performed following the standard protocol without intravenous contrast. Multiplanar CT image reconstructions of the cervical spine were also generated. COMPARISON:  PET-CT 05/14/2016 and earlier. FINDINGS: CT HEAD FINDINGS Small volume retained secretions in the nasopharynx. Visualized paranasal sinuses and mastoids are well pneumatized. Calvarium intact. Osteopenia. No acute orbits soft tissue finding. No scalp hematoma identified. Extensive Calcified atherosclerosis at the skull base. Cerebral volume is within normal limits for age. Mild for age patchy bilateral nonspecific white matter hypodensity. No midline shift, ventriculomegaly, mass effect, evidence of mass lesion, intracranial hemorrhage or evidence of cortically  based acute infarction. No suspicious intracranial vascular hyperdensity. CT CERVICAL SPINE FINDINGS Preserved cervical lordosis. Visualized skull base is intact. No atlanto-occipital dissociation. Cervicothoracic junction alignment is within normal limits. Bilateral posterior element alignment is within normal limits. Mild degenerative appearing spondylolisthesis at C5-C6 where there is chronic severe disc space loss and endplate spurring. No cervical spine fracture identified. Multilevel mild degenerative cervical spinal stenosis suspected. Visible upper thoracic levels appear intact. Negative lung apices aside from mild scarring. Calcified carotid atherosclerosis in the neck. Small volume retained secretions in the hypopharynx. Otherwise negative noncontrast neck soft tissues. IMPRESSION: 1. No acute intracranial abnormality. Mild for age cerebral white matter changes most commonly due to chronic small vessel disease. 2. No acute fracture or listhesis identified in the cervical spine. Ligamentous injury is not excluded. 3. Mild retained secretions in the  pharynx. Electronically Signed   By: Genevie Ann M.D.   On: 06/21/2016 17:39   Dg Hip Unilat W Or Wo Pelvis 2-3 Views Right  Result Date: 06/21/2016 CLINICAL DATA:  Pain after fall EXAM: DG HIP (WITH OR WITHOUT PELVIS) 2-3V RIGHT COMPARISON:  None. FINDINGS: There is a fracture through the right femoral neck. Laterally and superiorly, the fracture extends into the subcapital region. No dislocation identified. No other acute abnormalities. IMPRESSION: Fracture through the right femoral neck extending into the subcapital region superiorly and laterally. No dislocation. Electronically Signed   By: Dorise Bullion III M.D   On: 06/21/2016 17:18    EKG: Normal sinus rhythm with diffuse T-wave inversion in the anterior precordial leads  Weights: Filed Weights   06/21/16 1559  Weight: 102 lb 4.8 oz (46.4 kg)     Physical Exam: Blood pressure (!) 155/67, pulse 86, temperature 97.8 F (36.6 C), temperature source Oral, resp. rate 18, height 5\' 5"  (1.651 m), weight 102 lb 4.8 oz (46.4 kg), SpO2 97 %. Body mass index is 17.02 kg/m. General: Well developed, well nourished, in no acute distress. Head eyes ears nose throat: Normocephalic, atraumatic, sclera non-icteric, no xanthomas, nares are without discharge. No apparent thyromegaly and/or mass  Lungs: Normal respiratory effort.  no wheezes, no rales, no rhonchi.  Heart: RRR with normal S1 S2. no murmur gallop, no rub, PMI is normal size and placement, carotid upstroke normal without bruit, jugular venous pressure is normal Abdomen: Soft, non-tender, non-distended with normoactive bowel sounds. No hepatomegaly. No rebound/guarding. No obvious abdominal masses. Abdominal aorta is normal size without bruit Extremities:Trace edema. no cyanosis, no clubbing, no ulcers  Peripheral : 2+ bilateral upper extremity pulses, 2+ bilateral femoral pulses, 2+ bilateral dorsal pedal pulse Neuro: Alert and oriented. No facial asymmetry. No focal deficit. Moves all  extremities spontaneously. Musculoskeletal: Normal muscle tone without kyphosis Psych:  Responds to questions appropriately with a normal affect.    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 Continue aspirin and heparin for further risk reduction in myocardial infarction 2. Beta blocker for LV systolic dysfunction and possible increased dose as necessary 3. Proceed to cardiac catheterization to assess coronary anatomy and further treatment thereof is necessary. Patient understands risk and benefits of cardiac catheterization. This includes a possibility of death stroke heart attack infection bleeding or blood clot. She is at low risk for conscious sedation  Signed, Corey Skains M.D. [facility] 06/22/2016, 5:15 PM

## 2016-06-22 NOTE — Progress Notes (Addendum)
I called patient's son. [name] was upset that patient did not receive pain medications when nurse paged me. I explained to the patient's son that I was in the ICU with several patients and extremely busy and unable to respond to my paged immediately. I then asked the patient's son if he had any specific questions for me. I explained to him that patient has a hip fracture and discussed what orthopedic surgery is recommending. I also explained the patient has an elevation in her troponin and we are awaiting echocardiogram and cardiology consult. He then asked me what time the cardiologist is coming to see the patient. I explained to him that I did not know the answer to that question. I explained that the nurses have paged the cardiologist for the consult and I may be able to try to call and see if they had a time. I did explain to him as per the nursing report to me this morning that the patient will have her echocardiogram around noon. The patient's son then asked how many hospitalists are in the group. He then asked that since I did not know the answer to his questions about what time the Cardioligist was coming that he wanted his mother to see another hospitalist. I am going to try to arrange this. He then says this better not take hours. I explained that since I saw his mother today. I will have a hospitalist see her tomorrow. He wanted to file a complaint and I directed him to [place].  I have discussed with Dewaine Oats and Dr Earleen Newport who will resume care tomorrow  TIME 35 minutes.

## 2016-06-22 NOTE — Progress Notes (Signed)
ANTICOAGULATION CONSULT NOTE - Initial Consult  Pharmacy Consult for Heparin Drip Indication: chest pain/ACS  Allergies  Allergen Reactions  . Amlodipine Besylate Other (See Comments)    Patient unaware of any allergy with this medicine  . Oxycodone     confusion    Patient Measurements: Height: 5\' 5"  (165.1 cm) Weight: 102 lb 4.8 oz (46.4 kg) IBW/kg (Calculated) : 57 Heparin Dosing Weight: 46.4 kg  Vital Signs: Temp: 97.6 F (36.4 C) (08/20 2024) Temp Source: Oral (08/20 2024) BP: 174/87 (08/20 2026) Pulse Rate: 93 (08/20 2026)  Labs:  Recent Labs  06/21/16 1607 06/21/16 1830 06/21/16 2254 06/22/16 0238  HGB 15.5  --   --  13.2  HCT 44.5  --   --  38.9  PLT 150  --   --  123*  APTT 26  --   --   --   LABPROT 13.8  --   --   --   INR 1.06  --   --   --   HEPARINUNFRC  --   --   --  0.41  CREATININE 1.24*  --   --   --   CKTOTAL 767*  --   --   --   TROPONINI 2.88* 3.58* 2.83* 2.63*    Estimated Creatinine Clearance: 26.5 mL/min (by C-G formula based on SCr of 1.24 mg/dL).   Medical History: Past Medical History:  Diagnosis Date  . Arthritis    fingers  . Cancer ([place])    rectal  . Cancer ([place]) 2012   colon cancer  . Cancer of contiguous sites of hypopharynx ([place]) 12/23/2015  . Colon cancer ([place])   . Difficult intubation   . Diverticulosis   . Hemorrhoids   . History of chicken pox   . Hypertension   . Osteopenia   . Status post chemotherapy    colon cancer  . Status post radiation therapy    colon cancer 2013    Medications:  Prescriptions Prior to Admission  Medication Sig Dispense Refill Last Dose  . FLUoxetine (PROZAC) 10 MG tablet Take 1 tablet (10 mg total) by mouth daily. 30 tablet 3 unknown  . megestrol (MEGACE) 40 MG tablet Take 1 tablet (40 mg total) by mouth 2 (two) times daily. 60 tablet 2 unknown  . metoprolol succinate (TOPROL-XL) 25 MG 24 hr tablet TAKE ONE-HALF TABLET BY MOUTH ONCE DAILY* NEEDS OFFICE VISIT* 90 tablet 3 unknown   . Nutritional Supplements (ENSURE PLUS PO) Take 1 Can by mouth daily.    unknown  . nystatin (MYCOSTATIN) 100000 UNIT/ML suspension Take 5 mLs by mouth 4 (four) times daily. X 14 days.  0 unknown  . [DISCONTINUED] silver sulfADIAZINE (SILVADENE) 1 % cream Apply 1 application topically daily. 50 g 0 06/20/2016 at Unknown time    Assessment: Patient is admitted for hip fracture and NSTEMI to be initiated on Heparin infusion. Reviewed labs and PTA meds.  Goal of Therapy:  Heparin level 0.3-0.7 units/ml Monitor platelets by anticoagulation protocol: Yes   Plan:  Give 2750 units bolus x 1 Start heparin infusion at 550 units/hr Check anti-Xa level in 8 hours and daily while on heparin Continue to monitor H&H and platelets  8/21:  HL @ 0238 Will continue this pt on current rate of 550 units/hr and recheck HL on 8/21 @ 10:30.   06/22/2016 3:32 AM

## 2016-06-22 NOTE — Progress Notes (Signed)
ANTICOAGULATION CONSULT NOTE - Initial Consult  Pharmacy Consult for Heparin Drip Indication: chest pain/ACS  Allergies  Allergen Reactions  . Amlodipine Besylate Other (See Comments)    Patient unaware of any allergy with this medicine  . Oxycodone     confusion    Patient Measurements: Height: 5\' 5"  (165.1 cm) Weight: 102 lb 4.8 oz (46.4 kg) IBW/kg (Calculated) : 57 Heparin Dosing Weight: 46.4 kg  Vital Signs: Temp: 97.8 F (36.6 C) (08/21 1055) Temp Source: Oral (08/21 1055) BP: 155/67 (08/21 1055) Pulse Rate: 86 (08/21 1055)  Labs:  Recent Labs  06/21/16 1607 06/21/16 1830 06/21/16 2254 06/22/16 0238 06/22/16 1254  HGB 15.5  --   --  13.2  --   HCT 44.5  --   --  38.9  --   PLT 150  --   --  123*  --   APTT 26  --   --   --   --   LABPROT 13.8  --   --   --   --   INR 1.06  --   --   --   --   HEPARINUNFRC  --   --   --  0.41 0.38  CREATININE 1.24*  --   --  0.92  --   CKTOTAL 767*  --   --   --   --   TROPONINI 2.88* 3.58* 2.83* 2.63*  --     Estimated Creatinine Clearance: 35.7 mL/min (by C-G formula based on SCr of 0.92 mg/dL).   Medical History: Past Medical History:  Diagnosis Date  . Arthritis    fingers  . Cancer ([place])    rectal  . Cancer ([place]) 2012   colon cancer  . Cancer of contiguous sites of hypopharynx ([place]) 12/23/2015  . Colon cancer ([place])   . Difficult intubation   . Diverticulosis   . Hemorrhoids   . History of chicken pox   . Hypertension   . Osteopenia   . Status post chemotherapy    colon cancer  . Status post radiation therapy    colon cancer 2013    Medications:  Prescriptions Prior to Admission  Medication Sig Dispense Refill Last Dose  . FLUoxetine (PROZAC) 10 MG tablet Take 1 tablet (10 mg total) by mouth daily. 30 tablet 3 unknown  . megestrol (MEGACE) 40 MG tablet Take 1 tablet (40 mg total) by mouth 2 (two) times daily. 60 tablet 2 unknown  . metoprolol succinate (TOPROL-XL) 25 MG 24 hr tablet TAKE ONE-HALF  TABLET BY MOUTH ONCE DAILY* NEEDS OFFICE VISIT* 90 tablet 3 unknown  . Nutritional Supplements (ENSURE PLUS PO) Take 1 Can by mouth daily.    unknown  . nystatin (MYCOSTATIN) 100000 UNIT/ML suspension Take 5 mLs by mouth 4 (four) times daily. X 14 days.  0 unknown  . [DISCONTINUED] silver sulfADIAZINE (SILVADENE) 1 % cream Apply 1 application topically daily. 50 g 0 06/20/2016 at Unknown time    Assessment: Patient is admitted for hip fracture and NSTEMI to be initiated on Heparin infusion. Reviewed labs and PTA meds.  Goal of Therapy:  Heparin level 0.3-0.7 units/ml Monitor platelets by anticoagulation protocol: Yes   Plan:  HL is therapeutic at 0.38. Continue drip at current rate of 550units/hr. This is the second therapeutic level.  Will recheck in the AM along with a CBC.  Ramond Dial, Pharm.D Clinical Pharmacist  06/22/2016 1:30 PM

## 2016-06-22 NOTE — Progress Notes (Signed)
Patient continues to refused pain medication. Patient quickly returns to sleep after repositioning. Patient only complains of pain with repositioning.

## 2016-06-22 NOTE — Progress Notes (Signed)
[facility] [place] NAME: Emily [name]    MR#:  [id number]  DATE OF BIRTH:  03-Jan-1933  SUBJECTIVE:   Patient does not know how she fell Denies chest pain or SOB  Last seen by family a few days ago and she was fine  REVIEW OF SYSTEMS:    Review of Systems  Constitutional: Negative.  Negative for chills, fever and malaise/fatigue.  HENT: Negative.  Negative for ear discharge, ear pain, hearing loss, nosebleeds and sore throat.   Eyes: Negative.  Negative for blurred vision and pain.  Respiratory: Negative.  Negative for cough, hemoptysis, shortness of breath and wheezing.   Cardiovascular: Negative.  Negative for chest pain, palpitations and leg swelling.  Gastrointestinal: Negative.  Negative for abdominal pain, blood in stool, diarrhea, nausea and vomiting.  Genitourinary: Negative.  Negative for dysuria.  Musculoskeletal: Positive for falls and joint pain. Negative for back pain.  Skin: Negative.   Neurological: Negative for dizziness, tremors, speech change, focal weakness, seizures and headaches.  Endo/Heme/Allergies: Negative.  Does not bruise/bleed easily.  Psychiatric/Behavioral: Positive for memory loss. Negative for depression, hallucinations and suicidal ideas.    Tolerating Diet: NPO      DRUG ALLERGIES:   Allergies  Allergen Reactions  . Amlodipine Besylate Other (See Comments)    Patient unaware of any allergy with this medicine  . Oxycodone     confusion    VITALS:  Blood pressure (!) 155/67, pulse 86, temperature 97.8 F (36.6 C), temperature source Oral, resp. rate 18, height 5\' 5"  (1.651 m), weight 46.4 kg (102 lb 4.8 oz), SpO2 97 %.  PHYSICAL EXAMINATION:   Physical Exam  Constitutional: She is oriented to person, place, and time and well-developed, well-nourished, and in no distress. No distress.  HENT:  Head: Normocephalic.  Eyes: No scleral icterus.  Neck: Normal range of motion. Neck supple.  No JVD present. No tracheal deviation present.  Cardiovascular: Normal rate, regular rhythm and normal heart sounds.  Exam reveals no gallop and no friction rub.   No murmur heard. tachy  Pulmonary/Chest: Effort normal and breath sounds normal. No respiratory distress. She has no wheezes. She has no rales. She exhibits no tenderness.  Abdominal: Soft. Bowel sounds are normal. She exhibits no distension and no mass. There is no tenderness. There is no rebound and no guarding.  Musculoskeletal: Normal range of motion. She exhibits no edema.  Pain moving right leg  Neurological: She is alert and oriented to person, place, and time.  Skin: Skin is warm. No rash noted. No erythema.  Psychiatric: Affect and judgment normal.      LABORATORY PANEL:   CBC  Recent Labs Lab 06/22/16 0238  WBC 8.0  HGB 13.2  HCT 38.9  PLT 123*   ------------------------------------------------------------------------------------------------------------------  Chemistries   Recent Labs Lab 06/21/16 1607 06/22/16 0238  NA 142 143  K 3.7 3.0*  CL 107 114*  CO2 24 22  GLUCOSE 132* 106*  BUN 41* 37*  CREATININE 1.24* 0.92  CALCIUM 10.6* 9.1  AST 76*  --   ALT 38  --   ALKPHOS 61  --   BILITOT 1.1  --    ------------------------------------------------------------------------------------------------------------------  Cardiac Enzymes  Recent Labs Lab 06/21/16 1830 06/21/16 2254 06/22/16 0238  TROPONINI 3.58* 2.83* 2.63*   ------------------------------------------------------------------------------------------------------------------  RADIOLOGY:  Dg Chest 1 View  Result Date: 06/21/2016 CLINICAL DATA:  Found down today. Right hip pain and dry mouth. Throat cancer. EXAM: CHEST 1  VIEW COMPARISON:  PET CT 05/14/2016.  Radiographs 01/01/2016. FINDINGS: 1702 hours. The heart size and mediastinal contours are stable. There is aortic atherosclerosis. Right subclavian Port-A-Cath tip extends to  the SVC right atrial level. The lungs are clear. There is no pleural effusion or pneumothorax. No evidence of residual pneumoperitoneum. Stable old rib fractures or thoracotomy defects bilaterally. Percutaneous G-tube noted. IMPRESSION: No active cardiopulmonary process. Electronically Signed   By: Richardean Sale M.D.   On: 06/21/2016 17:24   Ct Head Wo Contrast  Result Date: 06/21/2016 CLINICAL DATA:  82 year old female found down. Pain and dehydration. Recently completed treatment for throat cancer. Initial encounter. EXAM: CT HEAD WITHOUT CONTRAST CT CERVICAL SPINE WITHOUT CONTRAST TECHNIQUE: Multidetector CT imaging of the head and cervical spine was performed following the standard protocol without intravenous contrast. Multiplanar CT image reconstructions of the cervical spine were also generated. COMPARISON:  PET-CT 05/14/2016 and earlier. FINDINGS: CT HEAD FINDINGS Small volume retained secretions in the nasopharynx. Visualized paranasal sinuses and mastoids are well pneumatized. Calvarium intact. Osteopenia. No acute orbits soft tissue finding. No scalp hematoma identified. Extensive Calcified atherosclerosis at the skull base. Cerebral volume is within normal limits for age. Mild for age patchy bilateral nonspecific white matter hypodensity. No midline shift, ventriculomegaly, mass effect, evidence of mass lesion, intracranial hemorrhage or evidence of cortically based acute infarction. No suspicious intracranial vascular hyperdensity. CT CERVICAL SPINE FINDINGS Preserved cervical lordosis. Visualized skull base is intact. No atlanto-occipital dissociation. Cervicothoracic junction alignment is within normal limits. Bilateral posterior element alignment is within normal limits. Mild degenerative appearing spondylolisthesis at C5-C6 where there is chronic severe disc space loss and endplate spurring. No cervical spine fracture identified. Multilevel mild degenerative cervical spinal stenosis suspected.  Visible upper thoracic levels appear intact. Negative lung apices aside from mild scarring. Calcified carotid atherosclerosis in the neck. Small volume retained secretions in the hypopharynx. Otherwise negative noncontrast neck soft tissues. IMPRESSION: 1. No acute intracranial abnormality. Mild for age cerebral white matter changes most commonly due to chronic small vessel disease. 2. No acute fracture or listhesis identified in the cervical spine. Ligamentous injury is not excluded. 3. Mild retained secretions in the pharynx. Electronically Signed   By: Genevie Ann M.D.   On: 06/21/2016 17:39   Ct Cervical Spine Wo Contrast  Result Date: 06/21/2016 CLINICAL DATA:  82 year old female found down. Pain and dehydration. Recently completed treatment for throat cancer. Initial encounter. EXAM: CT HEAD WITHOUT CONTRAST CT CERVICAL SPINE WITHOUT CONTRAST TECHNIQUE: Multidetector CT imaging of the head and cervical spine was performed following the standard protocol without intravenous contrast. Multiplanar CT image reconstructions of the cervical spine were also generated. COMPARISON:  PET-CT 05/14/2016 and earlier. FINDINGS: CT HEAD FINDINGS Small volume retained secretions in the nasopharynx. Visualized paranasal sinuses and mastoids are well pneumatized. Calvarium intact. Osteopenia. No acute orbits soft tissue finding. No scalp hematoma identified. Extensive Calcified atherosclerosis at the skull base. Cerebral volume is within normal limits for age. Mild for age patchy bilateral nonspecific white matter hypodensity. No midline shift, ventriculomegaly, mass effect, evidence of mass lesion, intracranial hemorrhage or evidence of cortically based acute infarction. No suspicious intracranial vascular hyperdensity. CT CERVICAL SPINE FINDINGS Preserved cervical lordosis. Visualized skull base is intact. No atlanto-occipital dissociation. Cervicothoracic junction alignment is within normal limits. Bilateral posterior  element alignment is within normal limits. Mild degenerative appearing spondylolisthesis at C5-C6 where there is chronic severe disc space loss and endplate spurring. No cervical spine fracture identified. Multilevel mild degenerative cervical spinal stenosis  suspected. Visible upper thoracic levels appear intact. Negative lung apices aside from mild scarring. Calcified carotid atherosclerosis in the neck. Small volume retained secretions in the hypopharynx. Otherwise negative noncontrast neck soft tissues. IMPRESSION: 1. No acute intracranial abnormality. Mild for age cerebral white matter changes most commonly due to chronic small vessel disease. 2. No acute fracture or listhesis identified in the cervical spine. Ligamentous injury is not excluded. 3. Mild retained secretions in the pharynx. Electronically Signed   By: Genevie Ann M.D.   On: 06/21/2016 17:39   Dg Hip Unilat W Or Wo Pelvis 2-3 Views Right  Result Date: 06/21/2016 CLINICAL DATA:  Pain after fall EXAM: DG HIP (WITH OR WITHOUT PELVIS) 2-3V RIGHT COMPARISON:  None. FINDINGS: There is a fracture through the right femoral neck. Laterally and superiorly, the fracture extends into the subcapital region. No dislocation identified. No other acute abnormalities. IMPRESSION: Fracture through the right femoral neck extending into the subcapital region superiorly and laterally. No dislocation. Electronically Signed   By: Dorise Bullion III M.D   On: 06/21/2016 17:18     ASSESSMENT AND PLAN:   82 year old female with a history of throat cancer status post feeding tube and chemotherapy who is status post fall and found to have elevation in troponin and right hip fracture.   1. Non-ST elevation MI: Patient will continue on IV heparin drip. Follow up on echocardiogram and cardiology consult. Follow up on lipid panel Continue aspirin and Toprol.  2. Right hip fracture: Ideally patient would need to undergo surgery however due to problem #1 he need for  cardiac clearance this is on hold. Next line continue pain medications Appreciate with the consult.  3. Decreased oral intake: Continue Megace and Prozac.  4. Essential hypertension: Continue metoprolol.  5. Acute kidney injury due to decreased oral intake and dehydration. Creatinine has improved with IV fluids.  Management plans discussed with the patient and she is in agreement.  CODE STATUS: DNR  TOTAL TIME TAKING CARE OF THIS PATIENT: 30 minutes.     POSSIBLE D/C ??, DEPENDING ON CLINICAL CONDITION.   Vici Novick M.D on 06/22/2016 at 12:07 PM  Between 7am to 6pm - Pager - [phone] After 6pm go to [email or web address] - password [email or web address] [facility]  Office  [phone]  CC: Primary care physician; Lelon Huh, MD  Note: This dictation was prepared with Dragon dictation along with smaller phrase technology. Any transcriptional errors that result from this process are unintentional.

## 2016-06-22 NOTE — Progress Notes (Signed)
Initial Nutrition Assessment  DOCUMENTATION CODES:   Underweight  INTERVENTION:  -Discussed importance of adequate nutrition with pt and pt's family. Pt has PEG tube but has not been using for a while (only took 6-8 cans total per family). Family worried about pt's nutritional status but do not appear realistic with regards to pt's current ability to meet nutritional needs without supplementation; family wanting pt to eat meals and not rely on nutritional supplements such as Ensure or Tube Feeding. After long discussion regarding pt status, family is agreeable that if pt not taking adequate po, may consider using PEG for tube feeding. For now, will encourage po intake with nursing assistance and Ensure between meals.  -Recommend addition of Ensure Enlive po BID between meals, each supplement provides 350 kcal and 20 grams of protein -If po intake inadequate, recommend supplemental nutrition via PEG tube. It is possible that conditional orders could be placed that if pt does not eat at least 50% of a meal for instance, pt would receive a bolus feeding. Will continue to assess, RN aware of importance of strict I/O -Pt may benefit from liberalizing diet to Regular although on visit today pt could not think of any foods that sounded good to her.   NUTRITION DIAGNOSIS:   Inadequate oral intake related to poor appetite, dysphagia, chronic illness as evidenced by per patient/family report.  GOAL:   Patient will meet greater than or equal to 90% of their needs  MONITOR:   PO intake, Labs, Weight trends, Supplement acceptance  REASON FOR ASSESSMENT:   Consult Enteral/tube feeding initiation and management  ASSESSMENT:   82 yo female admitted s/p fall with right hip fracture, NSTEMI, decreased oral intake and wt loss. Pt with hx of throat cancer with PEG tube placement   Family (son and daugther in law) at bedside and report that pt could "live off [employer] and Ensure". They report PEG tube  was placed in January(?)  and pt maybe received a total of 6-8 cans of Jevity via tube. Writer unclear at this time as to why pt did not utilize feeding tube for longer period time. They report that pt can tolerate most foods and drinks thin liquids but that appetite has been poor. Diet advanced today but pt has not received a meal tray. Writer asked pt what she would like to eat and pt's response was she could "take it or leave it." Noted pt on megace, pt has no appetite. Noted 10.5% wt loss in <6 months per wt encounters.  Family very concerned about nutritional status. Family kept asking writer "who is going to be in the room to make sure she [pt] eats." Family wanting pt to focus on eating "real food." Not keen on use of nutritional supplements such as Ensure. Family reports pt has no difficulty chewing or swallowing, although they indicate that pt recently had follow-up ENT appointment in which MD reported no mass but persistent swelling that might be affecting swallowing.    Unable to complete Nutrition-Focused physical exam at this time.    Past Medical History:  Diagnosis Date  . Arthritis    fingers  . Cancer ([place])    rectal  . Cancer ([place]) 2012   colon cancer  . Cancer of contiguous sites of hypopharynx ([place]) 12/23/2015  . Colon cancer ([place])   . Difficult intubation   . Diverticulosis   . Hemorrhoids   . History of chicken pox   . Hypertension   . Osteopenia   .  Status post chemotherapy    colon cancer  . Status post radiation therapy    colon cancer 2013     Diet Order:  Diet Heart Room service appropriate? Yes with Assist; Fluid consistency: Thin  Skin:  Wound (see comment) (stage II sacrum, stage I ankle)  Last BM:  no documented BM   Labs: potassium 3.0 (supplemented)  Meds: NS at 125 ml/hr, megace  Height:   Ht Readings from Last 1 Encounters:  06/21/16 5\' 5"  (1.651 m)    Weight: 10.5% wt loss since March 2017 (<6 months)  Wt Readings from Last 1  Encounters:  06/21/16 102 lb 4.8 oz (46.4 kg)    Wt Readings from Last 10 Encounters:  06/21/16 102 lb 4.8 oz (46.4 kg)  06/02/16 102 lb 6.4 oz (46.4 kg)  05/19/16 101 lb 11.9 oz (46.2 kg)  04/15/16 106 lb 0.7 oz (48.1 kg)  04/15/16 106 lb 14.8 oz (48.5 kg)  03/18/16 105 lb 8 oz (47.9 kg)  02/19/16 109 lb 3 oz (49.5 kg)  02/05/16 112 lb 3.4 oz (50.9 kg)  01/27/16 114 lb (51.7 kg)  01/20/16 114 lb 13.8 oz (52.1 kg)    BMI:  Body mass index is 17.02 kg/m.  Estimated Nutritional Needs:   Kcal:  1400-1610 kcals  Protein:  60-80 g  Fluid:  >/= 1.5 L  EDUCATION NEEDS:   No education needs identified at this time  [place], [place], [place] [phone] Pager  [phone] Weekend/On-Call Pager

## 2016-06-22 NOTE — Care Management (Signed)
Admitted from home where patient lives alone. She was found on the floor by her son and daughter in law. Found to have had a NSTEMI. On heparin gtt., cardiology consult pending. Right hip fracture. Ortho consult for possible hemiarthroplasty after medical cement.  Following progression.

## 2016-06-23 ENCOUNTER — Ambulatory Visit: Payer: Medicare Other

## 2016-06-23 ENCOUNTER — Encounter
Admission: EM | Disposition: A | Discharge status: Skilled Nursing Facility | Payer: Self-pay | Source: Home / Self Care | Attending: Internal Medicine

## 2016-06-23 DIAGNOSIS — L8990 Pressure ulcer of unspecified site, unspecified stage: Secondary | ICD-10-CM | POA: Insufficient documentation

## 2016-06-23 HISTORY — PX: CARDIAC CATHETERIZATION: SHX172

## 2016-06-23 LAB — CBC
HEMATOCRIT: 32.2 % — AB (ref 35.0–47.0)
Hemoglobin: 11 g/dL — ABNORMAL LOW (ref 12.0–16.0)
MCH: 31.8 pg (ref 26.0–34.0)
MCHC: 34.1 g/dL (ref 32.0–36.0)
MCV: 93.3 fL (ref 80.0–100.0)
PLATELETS: 121 10*3/uL — AB (ref 150–440)
RBC: 3.45 MIL/uL — AB (ref 3.80–5.20)
RDW: 14.1 % (ref 11.5–14.5)
WBC: 5.2 10*3/uL (ref 3.6–11.0)

## 2016-06-23 LAB — HEPARIN LEVEL (UNFRACTIONATED): HEPARIN UNFRACTIONATED: 0.21 [IU]/mL — AB (ref 0.30–0.70)

## 2016-06-23 LAB — MAGNESIUM: Magnesium: 1.6 mg/dL — ABNORMAL LOW (ref 1.7–2.4)

## 2016-06-23 LAB — BASIC METABOLIC PANEL
ANION GAP: 6 (ref 5–15)
BUN: 32 mg/dL — AB (ref 6–20)
CALCIUM: 8.1 mg/dL — AB (ref 8.9–10.3)
CO2: 18 mmol/L — ABNORMAL LOW (ref 22–32)
CREATININE: 0.9 mg/dL (ref 0.44–1.00)
Chloride: 117 mmol/L — ABNORMAL HIGH (ref 101–111)
GFR calc Af Amer: >60 mL/min (ref >60)
GFR, EST NON AFRICAN AMERICAN: 59 mL/min — AB (ref >60)
GLUCOSE: 91 mg/dL (ref 65–99)
Potassium: 3.2 mmol/L — ABNORMAL LOW (ref 3.5–5.1)
Sodium: 141 mmol/L (ref 135–145)

## 2016-06-23 SURGERY — LEFT HEART CATH AND CORONARY ANGIOGRAPHY
Anesthesia: Moderate Sedation

## 2016-06-23 MED ORDER — SODIUM CHLORIDE 0.9 % WEIGHT BASED INFUSION: 1.0000 mL/kg/h | INTRAVENOUS | Status: DC

## 2016-06-23 MED ORDER — MIDAZOLAM HCL 2 MG/2ML IJ SOLN
INTRAMUSCULAR | Status: DC | PRN
  Administered 2016-06-23: 1 mg via INTRAVENOUS

## 2016-06-23 MED ORDER — SODIUM CHLORIDE 0.9 % IV SOLN: 250.0000 mL | INTRAVENOUS | Status: DC | PRN

## 2016-06-23 MED ORDER — SODIUM CHLORIDE 0.9% FLUSH: 3.0000 mL | Freq: Two times a day (BID) | INTRAVENOUS | Status: DC

## 2016-06-23 MED ORDER — FENTANYL CITRATE (PF) 100 MCG/2ML IJ SOLN
INTRAMUSCULAR | Status: DC | PRN
  Administered 2016-06-23: 25 ug via INTRAVENOUS

## 2016-06-23 MED ORDER — ASPIRIN EC 81 MG PO TBEC
81.0000 mg | DELAYED_RELEASE_TABLET | Freq: Every day | ORAL | Status: DC
  Administered 2016-06-25 – 2016-06-27 (×3): 81 mg via ORAL
  Filled 2016-06-23 (×3): qty 1

## 2016-06-23 MED ORDER — MAGNESIUM SULFATE 2 GM/50ML IV SOLN
2.0000 g | Freq: Once | INTRAVENOUS | Status: AC
  Administered 2016-06-23: 2 g via INTRAVENOUS
  Filled 2016-06-23: qty 50

## 2016-06-23 MED ORDER — HEPARIN BOLUS VIA INFUSION
700.0000 [IU] | Freq: Once | INTRAVENOUS | Status: AC
  Administered 2016-06-23: 700 [IU] via INTRAVENOUS
  Filled 2016-06-23: qty 700

## 2016-06-23 MED ORDER — CEFAZOLIN IN D5W 1 GM/50ML IV SOLN
1.0000 g | Freq: Once | INTRAVENOUS | Status: AC
  Administered 2016-06-24: 1 g via INTRAVENOUS
  Filled 2016-06-23: qty 50

## 2016-06-23 MED ORDER — HEPARIN (PORCINE) IN NACL 2-0.9 UNIT/ML-% IJ SOLN
INTRAMUSCULAR | Status: AC
  Filled 2016-06-23: qty 500

## 2016-06-23 MED ORDER — MIDAZOLAM HCL 2 MG/2ML IJ SOLN
INTRAMUSCULAR | Status: AC
  Filled 2016-06-23: qty 2

## 2016-06-23 MED ORDER — ASPIRIN 81 MG PO CHEW: 81.0000 mg | CHEWABLE_TABLET | ORAL | Status: DC

## 2016-06-23 MED ORDER — POTASSIUM CHLORIDE CRYS ER 20 MEQ PO TBCR
40.0000 meq | EXTENDED_RELEASE_TABLET | Freq: Once | ORAL | Status: DC
  Filled 2016-06-23: qty 2

## 2016-06-23 MED ORDER — FENTANYL CITRATE (PF) 100 MCG/2ML IJ SOLN
INTRAMUSCULAR | Status: AC
  Filled 2016-06-23: qty 2

## 2016-06-23 MED ORDER — SODIUM CHLORIDE 0.9% FLUSH: 3.0000 mL | INTRAVENOUS | Status: DC | PRN

## 2016-06-23 MED ORDER — ENSURE ENLIVE PO LIQD
1.0000 | Freq: Three times a day (TID) | ORAL | Status: DC
  Administered 2016-06-23 – 2016-06-27 (×7): 237 mL via ORAL

## 2016-06-23 MED ORDER — IOPAMIDOL (ISOVUE-300) INJECTION 61%
INTRAVENOUS | Status: DC | PRN
  Administered 2016-06-23: 110 mL via INTRA_ARTERIAL

## 2016-06-23 MED ORDER — POTASSIUM CHLORIDE 20 MEQ PO PACK
40.0000 meq | PACK | Freq: Once | ORAL | Status: AC
  Administered 2016-06-23: 40 meq via ORAL
  Filled 2016-06-23: qty 2

## 2016-06-23 MED ORDER — SODIUM CHLORIDE 0.9 % WEIGHT BASED INFUSION: 3.0000 mL/kg/h | INTRAVENOUS | Status: DC

## 2016-06-23 SURGICAL SUPPLY — 10 items
CATH INFINITI 5FR ANG PIGTAIL (CATHETERS) ×3 IMPLANT
CATH INFINITI 5FR JL4 (CATHETERS) ×3 IMPLANT
CATH INFINITI JR4 5F (CATHETERS) ×3 IMPLANT
DEVICE CLOSURE MYNXGRIP 5F (Vascular Products) ×3 IMPLANT
DEVICE SAFEGUARD 24CM (GAUZE/BANDAGES/DRESSINGS) ×3 IMPLANT
KIT MANI 3VAL PERCEP (MISCELLANEOUS) ×3 IMPLANT
NEEDLE PERC 18GX7CM (NEEDLE) ×3 IMPLANT
PACK CARDIAC CATH (CUSTOM PROCEDURE TRAY) ×3 IMPLANT
SHEATH PINNACLE 5F 10CM (SHEATH) ×3 IMPLANT
WIRE EMERALD 3MM-J .035X150CM (WIRE) ×3 IMPLANT

## 2016-06-23 NOTE — Progress Notes (Signed)
Patient is hemodynamically stable post cardiac catheterization,right femoral site dressing is clean dry and intact,denies pain,40 ml air removed earlier from PAD,schedule for total hip replacement tomorrow,family has questions for surgeon before consent signed.

## 2016-06-23 NOTE — Progress Notes (Signed)
Have discussed case with Dr. Bobbye Charleston and patient's son as well as patient. We'll plan on right hip replacement tomorrow during the day, case is now scheduled. I discussed risks of surgery with her son including deep infection blood clot and medical complication with her heart status. He will discuss type of anesthetic tomorrow

## 2016-06-23 NOTE — Evaluation (Signed)
Clinical/Bedside Swallow Evaluation Patient Details  Name: Emily [name] MRN: [id number] Date of Birth: 1932-12-08  Today's Date: 06/23/2016 Time: SLP Start Time (ACUTE ONLY): 1400 SLP Stop Time (ACUTE ONLY): 1500 SLP Time Calculation (min) (ACUTE ONLY): 60 min  Past Medical History:  Past Medical History:  Diagnosis Date  . Arthritis    fingers  . Cancer ([place])    rectal  . Cancer ([place]) 2012   colon cancer  . Cancer of contiguous sites of hypopharynx ([place]) 12/23/2015  . Colon cancer ([place])   . Difficult intubation   . Diverticulosis   . Hemorrhoids   . History of chicken pox   . Hypertension   . Osteopenia   . Status post chemotherapy    colon cancer  . Status post radiation therapy    colon cancer 2013   Past Surgical History:  Past Surgical History:  Procedure Laterality Date  . ABDOMINAL HYSTERECTOMY    . ABDOMINAL HYSTERECTOMY  1980   partial  . APPENDECTOMY    . APPENDECTOMY  [id number]   Dr. Pat Patrick  . BREAST CYST ASPIRATION Bilateral    negative  . BREAST SURGERY     cyst removal  . BREAST SURGERY  1960   Breast Biopsy  . COLON SURGERY  06/28/2013   resected tumor from colon; [facility]  . COLONOSCOPY    . COLONOSCOPY N/A 10/01/2015   Procedure: COLONOSCOPY;  Surgeon: [name], MD;  Location: [facility] ENDOSCOPY;  Service: Endoscopy;  Laterality: N/A;  . DILATION AND CURETTAGE OF UTERUS  1957  . DIRECT LARYNGOSCOPY N/A 12/11/2015   Procedure: DIRECT LARYNGOSCOPY;  Surgeon: Clyde Canterbury, MD;  Location: [facility] ORS;  Service: ENT;  Laterality: N/A;  . ESOPHAGOSCOPY N/A 12/11/2015   Procedure: ESOPHAGOSCOPY;  Surgeon: Clyde Canterbury, MD;  Location: [facility] ORS;  Service: ENT;  Laterality: N/A;  . EUS N/A 01/20/2013   Procedure: LOWER ENDOSCOPIC ULTRASOUND (EUS);  Surgeon: Milus Banister, MD;  Location: [facility] ENDOSCOPY;  Service: Endoscopy;  Laterality: N/A;  . PEG PLACEMENT N/A 01/01/2016   Procedure: PERCUTANEOUS ENDOSCOPIC GASTROSTOMY (PEG)  PLACEMENT;  Surgeon: [name], MD;  Location: [facility] ORS;  Service: General;  Laterality: N/A;  . PORTACATH PLACEMENT Right 01/01/2016   Procedure: INSERTION PORT-A-CATH;  Surgeon: [name], MD;  Location: [facility] ORS;  Service: General;  Laterality: Right;  . TONSILLECTOMY     HPI:  Pt is a 82 y.o. female with a known history of Arthritis, throat cancer status post feeding tube, readiation/chemotherapy, colon cancer, hypertension, osteopenia- lives alone and has some episodes of forgetfulness for last few months. She was treated with radiation and feeding tube for throat cancer 5-6 month ago and since then her oncologist told that the cancer is completely resolved. Since then she has decreased overall eating and she is losing weight up until now. Son had visit to oncologist and primary care doctor for that, she was also started on Megace and Prozac, but not much help so she is finally scheduled to see a neurologist in 1-2 weeks. Pt appeared to have fallen but was not found for 2 days until Son went by the home. Pt was found confused at that time. She was found to be dehydrated, and her troponin was very high, right hip fracture found on x-ray. Currently, pt is alert/awake more oriented; she is able to hold a basic conversation and follow general instruction w/ SLP. Pt was feeding herself her lunch meal this afternoon.  Assessment / Plan / Recommendation Clinical Impression  Pt appears at reduced risk for aspiration of po intake following general aspiration precautions. Pt consumed po trials of thin liquids and puree/solids w/ no immediate, overt s/s of aspiration; no decline in respiratory status or change in vocal quality was noted during po trials. Oral phase of swallowing appeared wfl for mastication of po's and overall oral clearing. However, pt stated it was "difficult" to eat meats/solids d/t the discomfort when swallowing indicating the discomfort in the pharyngeal area - suspect this  could be related to the area of her Throat Ca and subsequent radiation tx. Pt does clear boluses of solids/meats when given time and when the food is moist. She indicated "easier" foods such as fruits and vegetables. Recommend pt continue w/ her current diet as ordered w/ the modification of cutting/chopping meats and add gravy to moisten for easier swallowing. Recommend general aspiration precautions and giving Meds in a Puree for easier swallowing IF needed. ST will f/u for any further education and toleratoin of diet as indicated while pt is admitted.     Aspiration Risk   (reduced)    Diet Recommendation  Regular/Mech Soft diet(meats cut and moistened w/ gravy); Thin liquids; general aspiration precautions.   Medication Administration: Whole meds with puree (IF indicated for easier swallowing)    Other  Recommendations Recommended Consults:  (Dietician following for supplements baseline) Oral Care Recommendations: Oral care BID;Staff/trained caregiver to provide oral care   Follow up Recommendations  None    Frequency and Duration min 2x/week  1 week       Prognosis Prognosis for Safe Diet Advancement: Good      Swallow Study   General Date of Onset: 06/21/16 HPI: Pt is a 82 y.o. female with a known history of Arthritis, throat cancer status post feeding tube, readiation/chemotherapy, colon cancer, hypertension, osteopenia- lives alone and has some episodes of forgetfulness for last few months. She was treated with radiation and feeding tube for throat cancer 5-6 month ago and since then her oncologist told that the cancer is completely resolved. Since then she has decreased overall eating and she is losing weight up until now. Son had visit to oncologist and primary care doctor for that, she was also started on Megace and Prozac, but not much help so she is finally scheduled to see a neurologist in 1-2 weeks. Pt appeared to have fallen but was not found for 2 days until Son went by the  home. Pt was found confused at that time. She was found to be dehydrated, and her troponin was very high, right hip fracture found on x-ray. Currently, pt is alert/awake more oriented; she is able to hold a basic conversation and follow general instruction w/ SLP. Pt was feeding herself her lunch meal this afternoon.  Previous Swallow Assessment: suspected previous swallowing assessment and therapy per a verbal report. Pt received a PEG placement 01/2016 during tx of throat Ca. Diet Prior to this Study: Regular;Thin liquids (soft foods) Temperature Spikes Noted: No (wbc not elevated) Respiratory Status: Room air History of Recent Intubation: No Behavior/Cognition: Alert;Cooperative;Pleasant mood;Requires cueing;Distractible Oral Cavity Assessment:  (NT d/t pt eating her lunch meal) Oral Care Completed by SLP: Recent completion by staff Oral Cavity - Dentition: Adequate natural dentition Vision: Functional for self-feeding Self-Feeding Abilities: Able to feed self;Needs set up Patient Positioning: Upright in bed (once assisted w/ positioning more upright) Baseline Vocal Quality: Normal;Low vocal intensity Volitional Cough:  (NT d/t pt eating) Volitional Swallow:  Able to elicit    Oral/Motor/Sensory Function Overall Oral Motor/Sensory Function: Within functional limits (appeared wfl w/ bolus management during eating)   Ice Chips Ice chips: Not tested   Thin Liquid Thin Liquid: Within functional limits Presentation: Self Fed;Straw (5+ trials) Other Comments: quite mild throat clear x1 but unsure if related to the po trial as it was delayed    Nectar Thick Nectar Thick Liquid: Not tested   Honey Thick Honey Thick Liquid: Not tested   Puree Puree: Within functional limits Presentation: Self Fed;Spoon (3+ trials)   Solid   GO   Solid: Impaired Oral Phase Impairments:  (none) Oral Phase Functional Implications:  (none) Pharyngeal Phase Impairments:  (none) Other Comments: pt stated it was  "difficult" to eat meats d/t the discomfort when swallowing indicating the discomfort in the pharyngeal area - suspect this could be related to the area of her Throat Ca and subsequent radiation tx. Pt does clear boluses of solids/meats when given time and when the food is moist.          Orinda Kenner, MS, CCC-SLP  Yaneisy Wenz 06/23/2016,3:30 PM

## 2016-06-23 NOTE — Care Management (Addendum)
Cardiac cath performed and patient is cleared by cardiology to proceed with hip surgery. Heparin drip is to be discontinued.  Patient says she lives at home alone and is able to perform her adls.  She has a Peg tube "that my son fed me through- I still have some of those cans" but is now able to take oral nutrition. She had the tube due to throat cancer.    She has a walker.  Does not have any other dme.  Patient was to have had a mbbs as an outpatient today but current medical condition interfered with that test.  Patient was evaluated at bedside by slp and able to take thin liquids.  It is reported that there is concern that patient has been losing weight

## 2016-06-23 NOTE — Progress Notes (Signed)
Spoke with Dr. Rudene Christians orthopedic surgery that the cardiac catheter was negative and no intervention was needed. He will see the patient and decide on timing of surgery.  Dr. Loletha Grayer

## 2016-06-23 NOTE — Care Management Important Message (Signed)
Important Message  Patient Details  Name: VENICE EOFF MRN: [id number] Date of Birth: [birth year]   Medicare Important Message Given:  Yes    Katrina Stack, RN 06/23/2016, 2:00 PM

## 2016-06-23 NOTE — Progress Notes (Signed)
[facility] Cardiology [facility] Encounter Note  Patient: Emily [name] / Admit Date: 06/21/2016 / Date of Encounter: 06/23/2016, 12:37 PM   Subjective: Patient has no evidence of chest discomfort. No rhythm disturbances or evidence of congestive heart failure. 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 of the apex with ejection fraction of 25-30% Cardiac catheterization shows continued apical ballooning syndrome with ejection fraction of 25-30% and normal coronary arteries with 0% stenosis  Review of Systems: Positive for: Leg pain Negative for: Vision change, hearing change, syncope, dizziness, nausea, vomiting,diarrhea, bloody stool, stomach pain, cough, congestion, diaphoresis, urinary frequency, urinary pain,skin lesions, skin rashes Others previously listed  Objective: Telemetry: Normal sinus rhythm Physical Exam: Blood pressure (!) 147/83, pulse 72, temperature 98.3 F (36.8 C), temperature source Oral, resp. rate 12, height 5\' 5"  (1.651 m), weight 108 lb 9.6 oz (49.3 kg), SpO2 99 %. Body mass index is 18.07 kg/m. General: Well developed, well nourished, in no acute distress. Head: Normocephalic, atraumatic, sclera non-icteric, no xanthomas, nares are without discharge. Neck: No apparent masses Lungs: Normal respirations with no wheezes, no rhonchi, no rales , no crackles   Heart: Regular rate and rhythm, normal S1 S2, no murmur, no rub, no gallop, PMI is normal size and placement, carotid upstroke normal without bruit, jugular venous pressure normal Abdomen: Soft, non-tender, non-distended with normoactive bowel sounds. No hepatosplenomegaly. Abdominal aorta is normal size without bruit Extremities: No edema, no clubbing, no cyanosis, no ulcers,  Peripheral: 2+ radial, 2+ femoral, 2+ dorsal pedal pulses Neuro: Alert and oriented. Moves all extremities spontaneously. Psych:  Responds to questions appropriately with a normal  affect.   Intake/Output Summary (Last 24 hours) at 06/23/16 1237 Last data filed at 06/23/16 0900  Gross per 24 hour  Intake          1952.08 ml  Output              450 ml  Net          1502.08 ml    Inpatient Medications:  . [START ON 06/24/2016] aspirin  81 mg Oral Pre-Cath  . [MAR Hold] aspirin EC  325 mg Oral Daily  . [MAR Hold] feeding supplement (ENSURE ENLIVE)  1 Bottle Oral Daily  . [MAR Hold] FLUoxetine  10 mg Oral Daily  . [MAR Hold] megestrol  40 mg Oral BID  . [MAR Hold] metoprolol succinate  25 mg Oral Daily  . [MAR Hold] potassium chloride  40 mEq Oral Once  . [MAR Hold] sodium chloride flush  3 mL Intravenous Q12H  . sodium chloride flush  3 mL Intravenous Q12H   Infusions:  . sodium chloride 125 mL/hr at 06/23/16 0223  . [START ON 06/24/2016] sodium chloride     Followed by  . [START ON 06/24/2016] sodium chloride    . heparin 650 Units/hr (06/23/16 0621)    Labs:  Recent Labs  06/22/16 0238 06/23/16 0451  NA 143 141  K 3.0* 3.2*  CL 114* 117*  CO2 22 18*  GLUCOSE 106* 91  BUN 37* 32*  CREATININE 0.92 0.90  CALCIUM 9.1 8.1*  MG  --  1.6*    Recent Labs  06/21/16 1607  AST 76*  ALT 38  ALKPHOS 61  BILITOT 1.1  PROT 7.0  ALBUMIN 3.8    Recent Labs  06/22/16 0238 06/23/16 0451  WBC 8.0 5.2  HGB 13.2 11.0*  HCT 38.9 32.2*  MCV 93.3 93.3  PLT 123* 121*  Recent Labs  06/21/16 1607 06/21/16 1830 06/21/16 2254 06/22/16 0238  CKTOTAL 767*  --   --   --   TROPONINI 2.88* 3.58* 2.83* 2.63*   Invalid input(s): POCBNP No results for input(s): HGBA1C in the last 72 hours.   Weights: Filed Weights   06/21/16 1559 06/23/16 0505  Weight: 102 lb 4.8 oz (46.4 kg) 108 lb 9.6 oz (49.3 kg)     Radiology/Studies:  Dg Chest 1 View  Result Date: 06/21/2016 CLINICAL DATA:  Found down today. Right hip pain and dry mouth. Throat cancer. EXAM: CHEST 1 VIEW COMPARISON:  PET CT 05/14/2016.  Radiographs 01/01/2016. FINDINGS: 1702 hours. The  heart size and mediastinal contours are stable. There is aortic atherosclerosis. Right subclavian Port-A-Cath tip extends to the SVC right atrial level. The lungs are clear. There is no pleural effusion or pneumothorax. No evidence of residual pneumoperitoneum. Stable old rib fractures or thoracotomy defects bilaterally. Percutaneous G-tube noted. IMPRESSION: No active cardiopulmonary process. Electronically Signed   By: Richardean Sale M.D.   On: 06/21/2016 17:24   Ct Head Wo Contrast  Result Date: 06/21/2016 CLINICAL DATA:  82 year old female found down. Pain and dehydration. Recently completed treatment for throat cancer. Initial encounter. EXAM: CT HEAD WITHOUT CONTRAST CT CERVICAL SPINE WITHOUT CONTRAST TECHNIQUE: Multidetector CT imaging of the head and cervical spine was performed following the standard protocol without intravenous contrast. Multiplanar CT image reconstructions of the cervical spine were also generated. COMPARISON:  PET-CT 05/14/2016 and earlier. FINDINGS: CT HEAD FINDINGS Small volume retained secretions in the nasopharynx. Visualized paranasal sinuses and mastoids are well pneumatized. Calvarium intact. Osteopenia. No acute orbits soft tissue finding. No scalp hematoma identified. Extensive Calcified atherosclerosis at the skull base. Cerebral volume is within normal limits for age. Mild for age patchy bilateral nonspecific white matter hypodensity. No midline shift, ventriculomegaly, mass effect, evidence of mass lesion, intracranial hemorrhage or evidence of cortically based acute infarction. No suspicious intracranial vascular hyperdensity. CT CERVICAL SPINE FINDINGS Preserved cervical lordosis. Visualized skull base is intact. No atlanto-occipital dissociation. Cervicothoracic junction alignment is within normal limits. Bilateral posterior element alignment is within normal limits. Mild degenerative appearing spondylolisthesis at C5-C6 where there is chronic severe disc space loss  and endplate spurring. No cervical spine fracture identified. Multilevel mild degenerative cervical spinal stenosis suspected. Visible upper thoracic levels appear intact. Negative lung apices aside from mild scarring. Calcified carotid atherosclerosis in the neck. Small volume retained secretions in the hypopharynx. Otherwise negative noncontrast neck soft tissues. IMPRESSION: 1. No acute intracranial abnormality. Mild for age cerebral white matter changes most commonly due to chronic small vessel disease. 2. No acute fracture or listhesis identified in the cervical spine. Ligamentous injury is not excluded. 3. Mild retained secretions in the pharynx. Electronically Signed   By: Genevie Ann M.D.   On: 06/21/2016 17:39   Ct Cervical Spine Wo Contrast  Result Date: 06/21/2016 CLINICAL DATA:  82 year old female found down. Pain and dehydration. Recently completed treatment for throat cancer. Initial encounter. EXAM: CT HEAD WITHOUT CONTRAST CT CERVICAL SPINE WITHOUT CONTRAST TECHNIQUE: Multidetector CT imaging of the head and cervical spine was performed following the standard protocol without intravenous contrast. Multiplanar CT image reconstructions of the cervical spine were also generated. COMPARISON:  PET-CT 05/14/2016 and earlier. FINDINGS: CT HEAD FINDINGS Small volume retained secretions in the nasopharynx. Visualized paranasal sinuses and mastoids are well pneumatized. Calvarium intact. Osteopenia. No acute orbits soft tissue finding. No scalp hematoma identified. Extensive Calcified atherosclerosis at the skull base.  Cerebral volume is within normal limits for age. Mild for age patchy bilateral nonspecific white matter hypodensity. No midline shift, ventriculomegaly, mass effect, evidence of mass lesion, intracranial hemorrhage or evidence of cortically based acute infarction. No suspicious intracranial vascular hyperdensity. CT CERVICAL SPINE FINDINGS Preserved cervical lordosis. Visualized skull base is  intact. No atlanto-occipital dissociation. Cervicothoracic junction alignment is within normal limits. Bilateral posterior element alignment is within normal limits. Mild degenerative appearing spondylolisthesis at C5-C6 where there is chronic severe disc space loss and endplate spurring. No cervical spine fracture identified. Multilevel mild degenerative cervical spinal stenosis suspected. Visible upper thoracic levels appear intact. Negative lung apices aside from mild scarring. Calcified carotid atherosclerosis in the neck. Small volume retained secretions in the hypopharynx. Otherwise negative noncontrast neck soft tissues. IMPRESSION: 1. No acute intracranial abnormality. Mild for age cerebral white matter changes most commonly due to chronic small vessel disease. 2. No acute fracture or listhesis identified in the cervical spine. Ligamentous injury is not excluded. 3. Mild retained secretions in the pharynx. Electronically Signed   By: Genevie Ann M.D.   On: 06/21/2016 17:39   Dg Hip Unilat W Or Wo Pelvis 2-3 Views Right  Result Date: 06/21/2016 CLINICAL DATA:  Pain after fall EXAM: DG HIP (WITH OR WITHOUT PELVIS) 2-3V RIGHT COMPARISON:  None. FINDINGS: There is a fracture through the right femoral neck. Laterally and superiorly, the fracture extends into the subcapital region. No dislocation identified. No other acute abnormalities. IMPRESSION: Fracture through the right femoral neck extending into the subcapital region superiorly and laterally. No dislocation. Electronically Signed   By: Dorise Bullion III M.D   On: 06/21/2016 17:18     Assessment and Recommendation  82 y.o. female with no history of cardiovascular disease who is had a fall with a fracture and unable to get up and in distress for one to 2 days likely having apical ballooning syndrome due to the stress with non-ST elevation myocardial infarction with a cardiac catheterization showing normal coronary arteries 1. Okay for discontinuation  of heparin 2. No further cardiac intervention at this time due to normal coronary arteries 3. 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 Proceed to orthopedic surgery for repair of fractured hip. Would consider spinal anesthesia if able 5. Diligent assessment and treatment of possible fluid loading with surgery watching for any congestive heart failure 6. No restrictions to surgical intervention and/or rehabilitation  Signed, Serafina Royals M.D. FACC

## 2016-06-23 NOTE — Progress Notes (Signed)
[name] has concerns about the progress of the patient's hip. Tried to explain to family member that the patient's heart condition has to be addressed first, before performing any surgery on the patient's hip. Family member is upset that the cardiac catherization was not performed on Monday. There are many factors that have contributed to the catherization being scheduled for Tuesday. The family remember was not pleased with current practice. The nursing supervisor is here to converse with family.

## 2016-06-23 NOTE — Progress Notes (Signed)
[facility] PROGRESS NOTE  Emily [name] [id number] DOB: 03-01-1933 DOA: 06/21/2016 PCP: Lelon Huh, MD  HPI/Subjective: Patient feels okay. No chest pain or hip pain currently.  Family with many concerns about timeline to surgery, when the cath to be done, care that is being given.  Objective: Vitals:   06/22/16 1929 06/23/16 0505  BP: (!) 121/52 140/60  Pulse: 84 74  Resp: 14 18  Temp: 98.3 F (36.8 C) 98.3 F (36.8 C)    Filed Weights   06/21/16 1559 06/23/16 0505  Weight: 46.4 kg (102 lb 4.8 oz) 49.3 kg (108 lb 9.6 oz)    ROS: Review of Systems  Constitutional: Negative for chills and fever.  Eyes: Negative for blurred vision.  Respiratory: Negative for cough and shortness of breath.   Cardiovascular: Negative for chest pain.  Gastrointestinal: Positive for abdominal pain and constipation. Negative for diarrhea, nausea and vomiting.  Genitourinary: Negative for dysuria.  Musculoskeletal: Negative for joint pain.  Neurological: Negative for dizziness and headaches.   Exam: Physical Exam  Constitutional: She is oriented to person, place, and time.  HENT:  Nose: No mucosal edema.  Mouth/Throat: No oropharyngeal exudate or posterior oropharyngeal edema.  Eyes: Conjunctivae, EOM and lids are normal. Pupils are equal, round, and reactive to light.  Neck: No JVD present. Carotid bruit is not present. No edema present. No thyroid mass and no thyromegaly present.  Cardiovascular: S1 normal and S2 normal.  Exam reveals no gallop.   Murmur heard.  Systolic murmur is present with a grade of 2/6  Pulses:      Dorsalis pedis pulses are 2+ on the right side, and 2+ on the left side.  Respiratory: No respiratory distress. She has no wheezes. She has no rhonchi. She has no rales.  GI: Soft. Bowel sounds are normal. There is no tenderness.  Musculoskeletal:       Right ankle: She exhibits no swelling.       Left ankle: She exhibits no swelling.  Lymphadenopathy:     She has no cervical adenopathy.  Neurological: She is alert and oriented to person, place, and time. No cranial nerve deficit.  Skin: Skin is warm. No rash noted. Nails show no clubbing.  Bruising, scabs left face.   Psychiatric: She has a normal mood and affect.      Data Reviewed: Basic Metabolic Panel:  Recent Labs Lab 06/21/16 1607 06/22/16 0238 06/23/16 0451  NA 142 143 141  K 3.7 3.0* 3.2*  CL 107 114* 117*  CO2 24 22 18*  GLUCOSE 132* 106* 91  BUN 41* 37* 32*  CREATININE 1.24* 0.92 0.90  CALCIUM 10.6* 9.1 8.1*  MG  --   --  1.6*   Liver Function Tests:  Recent Labs Lab 06/21/16 1607  AST 76*  ALT 38  ALKPHOS 61  BILITOT 1.1  PROT 7.0  ALBUMIN 3.8   CBC:  Recent Labs Lab 06/21/16 1607 06/22/16 0238 06/23/16 0451  WBC 9.5 8.0 5.2  HGB 15.5 13.2 11.0*  HCT 44.5 38.9 32.2*  MCV 93.5 93.3 93.3  PLT 150 123* 121*   Cardiac Enzymes:  Recent Labs Lab 06/21/16 1607 06/21/16 1830 06/21/16 2254 06/22/16 0238  CKTOTAL 767*  --   --   --   TROPONINI 2.88* 3.58* 2.83* 2.63*    Studies: Dg Chest 1 View  Result Date: 06/21/2016 CLINICAL DATA:  Found down today. Right hip pain and dry mouth. Throat cancer. EXAM: CHEST 1 VIEW COMPARISON:  PET  CT 05/14/2016.  Radiographs 01/01/2016. FINDINGS: 1702 hours. The heart size and mediastinal contours are stable. There is aortic atherosclerosis. Right subclavian Port-A-Cath tip extends to the SVC right atrial level. The lungs are clear. There is no pleural effusion or pneumothorax. No evidence of residual pneumoperitoneum. Stable old rib fractures or thoracotomy defects bilaterally. Percutaneous G-tube noted. IMPRESSION: No active cardiopulmonary process. Electronically Signed   By: Richardean Sale M.D.   On: 06/21/2016 17:24   Ct Head Wo Contrast  Result Date: 06/21/2016 CLINICAL DATA:  82 year old female found down. Pain and dehydration. Recently completed treatment for throat cancer. Initial encounter. EXAM:  CT HEAD WITHOUT CONTRAST CT CERVICAL SPINE WITHOUT CONTRAST TECHNIQUE: Multidetector CT imaging of the head and cervical spine was performed following the standard protocol without intravenous contrast. Multiplanar CT image reconstructions of the cervical spine were also generated. COMPARISON:  PET-CT 05/14/2016 and earlier. FINDINGS: CT HEAD FINDINGS Small volume retained secretions in the nasopharynx. Visualized paranasal sinuses and mastoids are well pneumatized. Calvarium intact. Osteopenia. No acute orbits soft tissue finding. No scalp hematoma identified. Extensive Calcified atherosclerosis at the skull base. Cerebral volume is within normal limits for age. Mild for age patchy bilateral nonspecific white matter hypodensity. No midline shift, ventriculomegaly, mass effect, evidence of mass lesion, intracranial hemorrhage or evidence of cortically based acute infarction. No suspicious intracranial vascular hyperdensity. CT CERVICAL SPINE FINDINGS Preserved cervical lordosis. Visualized skull base is intact. No atlanto-occipital dissociation. Cervicothoracic junction alignment is within normal limits. Bilateral posterior element alignment is within normal limits. Mild degenerative appearing spondylolisthesis at C5-C6 where there is chronic severe disc space loss and endplate spurring. No cervical spine fracture identified. Multilevel mild degenerative cervical spinal stenosis suspected. Visible upper thoracic levels appear intact. Negative lung apices aside from mild scarring. Calcified carotid atherosclerosis in the neck. Small volume retained secretions in the hypopharynx. Otherwise negative noncontrast neck soft tissues. IMPRESSION: 1. No acute intracranial abnormality. Mild for age cerebral white matter changes most commonly due to chronic small vessel disease. 2. No acute fracture or listhesis identified in the cervical spine. Ligamentous injury is not excluded. 3. Mild retained secretions in the pharynx.  Electronically Signed   By: Genevie Ann M.D.   On: 06/21/2016 17:39   Ct Cervical Spine Wo Contrast  Result Date: 06/21/2016 CLINICAL DATA:  82 year old female found down. Pain and dehydration. Recently completed treatment for throat cancer. Initial encounter. EXAM: CT HEAD WITHOUT CONTRAST CT CERVICAL SPINE WITHOUT CONTRAST TECHNIQUE: Multidetector CT imaging of the head and cervical spine was performed following the standard protocol without intravenous contrast. Multiplanar CT image reconstructions of the cervical spine were also generated. COMPARISON:  PET-CT 05/14/2016 and earlier. FINDINGS: CT HEAD FINDINGS Small volume retained secretions in the nasopharynx. Visualized paranasal sinuses and mastoids are well pneumatized. Calvarium intact. Osteopenia. No acute orbits soft tissue finding. No scalp hematoma identified. Extensive Calcified atherosclerosis at the skull base. Cerebral volume is within normal limits for age. Mild for age patchy bilateral nonspecific white matter hypodensity. No midline shift, ventriculomegaly, mass effect, evidence of mass lesion, intracranial hemorrhage or evidence of cortically based acute infarction. No suspicious intracranial vascular hyperdensity. CT CERVICAL SPINE FINDINGS Preserved cervical lordosis. Visualized skull base is intact. No atlanto-occipital dissociation. Cervicothoracic junction alignment is within normal limits. Bilateral posterior element alignment is within normal limits. Mild degenerative appearing spondylolisthesis at C5-C6 where there is chronic severe disc space loss and endplate spurring. No cervical spine fracture identified. Multilevel mild degenerative cervical spinal stenosis suspected. Visible upper thoracic levels  appear intact. Negative lung apices aside from mild scarring. Calcified carotid atherosclerosis in the neck. Small volume retained secretions in the hypopharynx. Otherwise negative noncontrast neck soft tissues. IMPRESSION: 1. No acute  intracranial abnormality. Mild for age cerebral white matter changes most commonly due to chronic small vessel disease. 2. No acute fracture or listhesis identified in the cervical spine. Ligamentous injury is not excluded. 3. Mild retained secretions in the pharynx. Electronically Signed   By: Genevie Ann M.D.   On: 06/21/2016 17:39   Dg Hip Unilat W Or Wo Pelvis 2-3 Views Right  Result Date: 06/21/2016 CLINICAL DATA:  Pain after fall EXAM: DG HIP (WITH OR WITHOUT PELVIS) 2-3V RIGHT COMPARISON:  None. FINDINGS: There is a fracture through the right femoral neck. Laterally and superiorly, the fracture extends into the subcapital region. No dislocation identified. No other acute abnormalities. IMPRESSION: Fracture through the right femoral neck extending into the subcapital region superiorly and laterally. No dislocation. Electronically Signed   By: Dorise Bullion III M.D   On: 06/21/2016 17:18    Scheduled Meds: . aspirin EC  325 mg Oral Daily  . feeding supplement (ENSURE ENLIVE)  1 Bottle Oral Daily  . FLUoxetine  10 mg Oral Daily  . megestrol  40 mg Oral BID  . metoprolol succinate  25 mg Oral Daily  . potassium chloride  40 mEq Oral Once  . sodium chloride flush  3 mL Intravenous Q12H   Continuous Infusions: . sodium chloride 125 mL/hr at 06/23/16 0223  . heparin 650 Units/hr (06/23/16 0621)    Assessment/Plan:  1. NSTEMI- cardiac cath today. Aspirin and heparin drip. Toprol. 2. Right hip fracture- ortho needs clearance from cardiology prior to doing procedure. Mortality within the first year very high after a hip fracture. Especially if the patient does not get up and walk again. 3. Hx colon cancer 4. Depression- fluoxetine 5. Hypomagnesemia. IV magnesium today 6. Hypokalemia replace potassium 7. Constipation. We'll get more aggressive with bowel regimen once cardiac catheter done. 8. Tried to address all concerns from family at this time. Family may want to be transferred if cardiac  catheter not done today. Nursing supervisor Kennyth Lose in the room with me the entire time I was there. She confirmed that cardiac catheter will be done this morning at some point. I made the patient nothing by mouth.  Code Status:     Code Status Orders        Start     Ordered   06/21/16 2030  Do not attempt resuscitation (DNR)  Continuous    Question Answer Comment  In the event of cardiac or respiratory ARREST Do not call a "code blue"   In the event of cardiac or respiratory ARREST Do not perform Intubation, CPR, defibrillation or ACLS   In the event of cardiac or respiratory ARREST Use medication by any route, position, wound care, and other measures to relive pain and suffering. May use oxygen, suction and manual treatment of airway obstruction as needed for comfort.      06/21/16 2029    Code Status History    Date Active Date Inactive Code Status Order ID Comments User Context   06/21/2016  8:29 PM 06/22/2016  9:00 AM DNR [id number]  Vaughan Basta, MD Inpatient    Questions for Most Recent Historical Code Status (Order [id number])    Question Answer Comment   In the event of cardiac or respiratory ARREST Do not call a "code blue"    In  the event of cardiac or respiratory ARREST Do not perform Intubation, CPR, defibrillation or ACLS    In the event of cardiac or respiratory ARREST Use medication by any route, position, wound care, and other measures to relive pain and suffering. May use oxygen, suction and manual treatment of airway obstruction as needed for comfort.         Advance Directive Documentation   Flowsheet Row Most Recent Value  Type of Advance Directive  Healthcare Power of [place], [place] Directive  Pre-existing out of facility DNR order (yellow form or pink MOST form)  No data  "MOST" Form in Place?  No data     Family Communication: Family at bedside Disposition Plan: Will need rehabilitation after hip  surgery  Consultants:  Cardiology  Orthopedic surgery  Time spent: 30 minutes  [place], [place]

## 2016-06-23 NOTE — Progress Notes (Signed)
ANTICOAGULATION CONSULT NOTE - Initial Consult  Pharmacy Consult for Heparin Drip Indication: chest pain/ACS  Allergies  Allergen Reactions  . Amlodipine Besylate Other (See Comments)    Patient unaware of any allergy with this medicine  . Oxycodone     confusion    Patient Measurements: Height: 5\' 5"  (165.1 cm) Weight: 108 lb 9.6 oz (49.3 kg) IBW/kg (Calculated) : 57 Heparin Dosing Weight: 46.4 kg  Vital Signs: Temp: 98.3 F (36.8 C) (08/22 0505) Temp Source: Oral (08/22 0505) BP: 140/60 (08/22 0505) Pulse Rate: 74 (08/22 0505)  Labs:  Recent Labs  06/21/16 1607 06/21/16 1830 06/21/16 2254 06/22/16 0238 06/22/16 1254 06/23/16 0451  HGB 15.5  --   --  13.2  --  11.0*  HCT 44.5  --   --  38.9  --  32.2*  PLT 150  --   --  123*  --  121*  APTT 26  --   --   --   --   --   LABPROT 13.8  --   --   --   --   --   INR 1.06  --   --   --   --   --   HEPARINUNFRC  --   --   --  0.41 0.38 0.21*  CREATININE 1.24*  --   --  0.92  --   --   CKTOTAL 767*  --   --   --   --   --   TROPONINI 2.88* 3.58* 2.83* 2.63*  --   --     Estimated Creatinine Clearance: 38 mL/min (by C-G formula based on SCr of 0.92 mg/dL).   Medical History: Past Medical History:  Diagnosis Date  . Arthritis    fingers  . Cancer ([place])    rectal  . Cancer ([place]) 2012   colon cancer  . Cancer of contiguous sites of hypopharynx ([place]) 12/23/2015  . Colon cancer ([place])   . Difficult intubation   . Diverticulosis   . Hemorrhoids   . History of chicken pox   . Hypertension   . Osteopenia   . Status post chemotherapy    colon cancer  . Status post radiation therapy    colon cancer 2013    Medications:  Prescriptions Prior to Admission  Medication Sig Dispense Refill Last Dose  . FLUoxetine (PROZAC) 10 MG tablet Take 1 tablet (10 mg total) by mouth daily. 30 tablet 3 unknown  . megestrol (MEGACE) 40 MG tablet Take 1 tablet (40 mg total) by mouth 2 (two) times daily. 60 tablet 2 unknown  .  metoprolol succinate (TOPROL-XL) 25 MG 24 hr tablet TAKE ONE-HALF TABLET BY MOUTH ONCE DAILY* NEEDS OFFICE VISIT* 90 tablet 3 unknown  . Nutritional Supplements (ENSURE PLUS PO) Take 1 Can by mouth daily.    unknown  . nystatin (MYCOSTATIN) 100000 UNIT/ML suspension Take 5 mLs by mouth 4 (four) times daily. X 14 days.  0 unknown  . [DISCONTINUED] silver sulfADIAZINE (SILVADENE) 1 % cream Apply 1 application topically daily. 50 g 0 06/20/2016 at Unknown time    Assessment: Patient is admitted for hip fracture and NSTEMI to be initiated on Heparin infusion. Reviewed labs and PTA meds.  Goal of Therapy:  Heparin level 0.3-0.7 units/ml Monitor platelets by anticoagulation protocol: Yes   Plan:  HL is therapeutic at 0.38. Continue drip at current rate of 550units/hr. This is the second therapeutic level.  Will recheck in the AM along with a  CBC.  8/23 AM heparin level 0.21. 700 unit bolus and increase rate to 650 units/hr. Recheck in 8 hours.  Ramond Dial, Pharm.D Clinical Pharmacist  06/23/2016 6:00 AM

## 2016-06-24 ENCOUNTER — Ambulatory Visit: Payer: Self-pay | Admitting: Family Medicine

## 2016-06-24 ENCOUNTER — Encounter: Payer: Self-pay | Admitting: Internal Medicine

## 2016-06-24 ENCOUNTER — Encounter
Admission: EM | Disposition: A | Discharge status: Skilled Nursing Facility | Payer: Self-pay | Source: Home / Self Care | Attending: Internal Medicine

## 2016-06-24 ENCOUNTER — Inpatient Hospital Stay: Payer: Medicare Other | Admitting: Anesthesiology

## 2016-06-24 ENCOUNTER — Inpatient Hospital Stay: Payer: Medicare Other

## 2016-06-24 HISTORY — PX: TOTAL HIP ARTHROPLASTY: SHX124

## 2016-06-24 LAB — BASIC METABOLIC PANEL
ANION GAP: 5 (ref 5–15)
BUN: 22 mg/dL — ABNORMAL HIGH (ref 6–20)
CO2: 19 mmol/L — ABNORMAL LOW (ref 22–32)
Calcium: 8 mg/dL — ABNORMAL LOW (ref 8.9–10.3)
Chloride: 115 mmol/L — ABNORMAL HIGH (ref 101–111)
Creatinine, Ser: 0.94 mg/dL (ref 0.44–1.00)
GFR calc Af Amer: >60 mL/min (ref >60)
GFR, EST NON AFRICAN AMERICAN: 56 mL/min — AB (ref >60)
GLUCOSE: 101 mg/dL — AB (ref 65–99)
POTASSIUM: 3.6 mmol/L (ref 3.5–5.1)
Sodium: 139 mmol/L (ref 135–145)

## 2016-06-24 LAB — CBC
HEMATOCRIT: 33.9 % — AB (ref 35.0–47.0)
HEMOGLOBIN: 11.5 g/dL — AB (ref 12.0–16.0)
MCH: 32.1 pg (ref 26.0–34.0)
MCHC: 34.1 g/dL (ref 32.0–36.0)
MCV: 94.3 fL (ref 80.0–100.0)
PLATELETS: 117 10*3/uL — AB (ref 150–440)
RBC: 3.59 MIL/uL — AB (ref 3.80–5.20)
RDW: 13.8 % (ref 11.5–14.5)
WBC: 6.2 10*3/uL (ref 3.6–11.0)

## 2016-06-24 LAB — MAGNESIUM: MAGNESIUM: 1.9 mg/dL (ref 1.7–2.4)

## 2016-06-24 LAB — APTT: APTT: 28 s (ref 24–36)

## 2016-06-24 SURGERY — ARTHROPLASTY, HIP, TOTAL, ANTERIOR APPROACH
Anesthesia: General | Site: Hip | Laterality: Right | Wound class: Clean

## 2016-06-24 MED ORDER — ALUM & MAG HYDROXIDE-SIMETH 200-200-20 MG/5ML PO SUSP: 30.0000 mL | ORAL | Status: DC | PRN

## 2016-06-24 MED ORDER — ONDANSETRON HCL 4 MG/2ML IJ SOLN
4.0000 mg | Freq: Four times a day (QID) | INTRAMUSCULAR | Status: DC | PRN
Start: 2016-06-24 — End: 2016-06-27

## 2016-06-24 MED ORDER — MORPHINE SULFATE (PF) 2 MG/ML IV SOLN
2.0000 mg | INTRAVENOUS | Status: DC | PRN
  Administered 2016-06-24: 2 mg via INTRAVENOUS
  Filled 2016-06-24: qty 1

## 2016-06-24 MED ORDER — ONDANSETRON HCL 4 MG/2ML IJ SOLN
INTRAMUSCULAR | Status: DC | PRN
  Administered 2016-06-24: 4 mg via INTRAVENOUS

## 2016-06-24 MED ORDER — ROCURONIUM BROMIDE 100 MG/10ML IV SOLN
INTRAVENOUS | Status: DC | PRN
  Administered 2016-06-24: 10 mg via INTRAVENOUS

## 2016-06-24 MED ORDER — EPHEDRINE SULFATE 50 MG/ML IJ SOLN
INTRAMUSCULAR | Status: DC | PRN
  Administered 2016-06-24 (×3): 5 mg via INTRAVENOUS

## 2016-06-24 MED ORDER — HYDROCODONE-ACETAMINOPHEN 5-325 MG PO TABS
1.0000 | ORAL_TABLET | ORAL | Status: DC | PRN
  Administered 2016-06-24: 1 via ORAL
  Administered 2016-06-25: 2 via ORAL
  Filled 2016-06-24: qty 1
  Filled 2016-06-24: qty 2

## 2016-06-24 MED ORDER — DIPHENHYDRAMINE HCL 12.5 MG/5ML PO ELIX: 12.5000 mg | ORAL_SOLUTION | ORAL | Status: DC | PRN

## 2016-06-24 MED ORDER — ONDANSETRON HCL 4 MG PO TABS: 4.0000 mg | ORAL_TABLET | Freq: Four times a day (QID) | ORAL | Status: DC | PRN

## 2016-06-24 MED ORDER — BUPIVACAINE-EPINEPHRINE (PF) 0.25% -1:200000 IJ SOLN
INTRAMUSCULAR | Status: AC
  Filled 2016-06-24: qty 30

## 2016-06-24 MED ORDER — TRANEXAMIC ACID 1000 MG/10ML IV SOLN
INTRAVENOUS | Status: AC
  Filled 2016-06-24: qty 10

## 2016-06-24 MED ORDER — SUCCINYLCHOLINE CHLORIDE 20 MG/ML IJ SOLN
INTRAMUSCULAR | Status: DC | PRN
  Administered 2016-06-24: 80 mg via INTRAVENOUS

## 2016-06-24 MED ORDER — FENTANYL CITRATE (PF) 100 MCG/2ML IJ SOLN
25.0000 ug | INTRAMUSCULAR | Status: DC | PRN
  Administered 2016-06-24 (×2): 50 ug via INTRAVENOUS

## 2016-06-24 MED ORDER — CEFAZOLIN IN D5W 1 GM/50ML IV SOLN
1.0000 g | Freq: Four times a day (QID) | INTRAVENOUS | Status: AC
  Administered 2016-06-24 – 2016-06-25 (×2): 1 g via INTRAVENOUS
  Filled 2016-06-24 (×3): qty 50

## 2016-06-24 MED ORDER — DEXAMETHASONE SODIUM PHOSPHATE 10 MG/ML IJ SOLN
INTRAMUSCULAR | Status: DC | PRN
  Administered 2016-06-24: 5 mg via INTRAVENOUS

## 2016-06-24 MED ORDER — MAGNESIUM HYDROXIDE 400 MG/5ML PO SUSP: 30.0000 mL | Freq: Every day | ORAL | Status: DC | PRN

## 2016-06-24 MED ORDER — SUGAMMADEX SODIUM 200 MG/2ML IV SOLN
INTRAVENOUS | Status: DC | PRN
  Administered 2016-06-24: 100 mg via INTRAVENOUS

## 2016-06-24 MED ORDER — FENTANYL CITRATE (PF) 100 MCG/2ML IJ SOLN
INTRAMUSCULAR | Status: DC | PRN
  Administered 2016-06-24 (×2): 50 ug via INTRAVENOUS

## 2016-06-24 MED ORDER — MAGNESIUM CITRATE PO SOLN
1.0000 | Freq: Once | ORAL | Status: DC | PRN
  Filled 2016-06-24: qty 296

## 2016-06-24 MED ORDER — ACETAMINOPHEN 500 MG PO TABS
1000.0000 mg | ORAL_TABLET | Freq: Four times a day (QID) | ORAL | Status: AC
  Administered 2016-06-24 – 2016-06-25 (×4): 1000 mg via ORAL
  Filled 2016-06-24 (×4): qty 2

## 2016-06-24 MED ORDER — MEPERIDINE HCL 25 MG/ML IJ SOLN: 6.2500 mg | INTRAMUSCULAR | Status: DC | PRN

## 2016-06-24 MED ORDER — MENTHOL 3 MG MT LOZG
1.0000 | LOZENGE | OROMUCOSAL | Status: DC | PRN
Start: 2016-06-24 — End: 2016-06-27
  Filled 2016-06-24: qty 9

## 2016-06-24 MED ORDER — TRANEXAMIC ACID 1000 MG/10ML IV SOLN
INTRAVENOUS | Status: DC | PRN
  Administered 2016-06-24: 1000 mg via INTRAVENOUS

## 2016-06-24 MED ORDER — NYSTATIN 100000 UNIT/ML MT SUSP
5.0000 mL | Freq: Four times a day (QID) | OROMUCOSAL | Status: DC
  Administered 2016-06-24 – 2016-06-27 (×10): 500000 [IU] via ORAL
  Filled 2016-06-24 (×10): qty 5

## 2016-06-24 MED ORDER — PHENYLEPHRINE HCL 10 MG/ML IJ SOLN
INTRAMUSCULAR | Status: DC | PRN
  Administered 2016-06-24 (×2): 100 ug via INTRAVENOUS
  Administered 2016-06-24: 150 ug via INTRAVENOUS

## 2016-06-24 MED ORDER — LIDOCAINE HCL (CARDIAC) 20 MG/ML IV SOLN
INTRAVENOUS | Status: DC | PRN
  Administered 2016-06-24: 50 mg via INTRAVENOUS

## 2016-06-24 MED ORDER — PROPOFOL 10 MG/ML IV BOLUS
INTRAVENOUS | Status: DC | PRN
  Administered 2016-06-24: 80 mg via INTRAVENOUS

## 2016-06-24 MED ORDER — BISACODYL 10 MG RE SUPP
10.0000 mg | Freq: Every day | RECTAL | Status: DC | PRN
Start: 2016-06-24 — End: 2016-06-27

## 2016-06-24 MED ORDER — DOCUSATE SODIUM 100 MG PO CAPS
100.0000 mg | ORAL_CAPSULE | Freq: Two times a day (BID) | ORAL | Status: DC
  Administered 2016-06-24 – 2016-06-27 (×6): 100 mg via ORAL
  Filled 2016-06-24 (×6): qty 1

## 2016-06-24 MED ORDER — FENTANYL CITRATE (PF) 100 MCG/2ML IJ SOLN
INTRAMUSCULAR | Status: AC
  Filled 2016-06-24: qty 2

## 2016-06-24 MED ORDER — NEOMYCIN-POLYMYXIN B GU 40-200000 IR SOLN
Status: AC
  Filled 2016-06-24: qty 4

## 2016-06-24 MED ORDER — PROMETHAZINE HCL 25 MG/ML IJ SOLN: 6.2500 mg | INTRAMUSCULAR | Status: DC | PRN

## 2016-06-24 MED ORDER — NEOMYCIN-POLYMYXIN B GU 40-200000 IR SOLN
Status: DC | PRN
  Administered 2016-06-24: 4 mL

## 2016-06-24 MED ORDER — PHENOL 1.4 % MT LIQD
1.0000 | OROMUCOSAL | Status: DC | PRN
  Filled 2016-06-24: qty 177

## 2016-06-24 MED ORDER — CHLORHEXIDINE GLUCONATE CLOTH 2 % EX PADS
6.0000 | MEDICATED_PAD | Freq: Every day | CUTANEOUS | Status: DC
Start: 2016-06-24 — End: 2016-06-24

## 2016-06-24 MED ORDER — ENOXAPARIN SODIUM 40 MG/0.4ML ~~LOC~~ SOLN
40.0000 mg | SUBCUTANEOUS | Status: DC
Start: 2016-06-25 — End: 2016-06-25
  Administered 2016-06-25: 40 mg via SUBCUTANEOUS
  Filled 2016-06-24: qty 0.4

## 2016-06-24 MED ORDER — SODIUM CHLORIDE 0.9 % IV SOLN
INTRAVENOUS | Status: DC
  Administered 2016-06-24: 23:00:00 via INTRAVENOUS

## 2016-06-24 SURGICAL SUPPLY — 46 items
BLADE SAW SAG 18.5X105 (BLADE) ×3 IMPLANT
BNDG COHESIVE 4X5 TAN STRL (GAUZE/BANDAGES/DRESSINGS) ×3 IMPLANT
BNDG COHESIVE 6X5 TAN STRL LF (GAUZE/BANDAGES/DRESSINGS) ×6 IMPLANT
CANISTER SUCT 1200ML W/VALVE (MISCELLANEOUS) ×3 IMPLANT
CAPT HIP TOTAL 3 ×3 IMPLANT
CATH FOL LEG HOLDER (MISCELLANEOUS) ×3 IMPLANT
CATH TRAY METER 16FR LF (MISCELLANEOUS) ×3 IMPLANT
CHLORAPREP W/TINT 26ML (MISCELLANEOUS) ×3 IMPLANT
DRAPE C-ARM XRAY 36X54 (DRAPES) ×3 IMPLANT
DRAPE INCISE IOBAN 66X60 STRL (DRAPES) IMPLANT
DRAPE POUCH INSTRU U-SHP 10X18 (DRAPES) ×3 IMPLANT
DRAPE SHEET LG 3/4 BI-LAMINATE (DRAPES) ×9 IMPLANT
DRAPE STERI IOBAN 125X83 (DRAPES) ×3 IMPLANT
DRAPE TABLE BACK 80X90 (DRAPES) ×3 IMPLANT
DRSG OPSITE POSTOP 4X8 (GAUZE/BANDAGES/DRESSINGS) ×6 IMPLANT
ELECT BLADE 6.5 EXT (BLADE) ×3 IMPLANT
GAUZE SPONGE 4X4 12PLY STRL (GAUZE/BANDAGES/DRESSINGS) ×3 IMPLANT
GLOVE BIOGEL PI IND STRL 9 (GLOVE) ×1 IMPLANT
GLOVE BIOGEL PI INDICATOR 9 (GLOVE) ×2
GLOVE SURG ORTHO 9.0 STRL STRW (GLOVE) ×6 IMPLANT
GOWN SRG 2XL LVL 4 RGLN SLV (GOWNS) ×1 IMPLANT
GOWN STRL NON-REIN 2XL LVL4 (GOWNS) ×2
GOWN STRL REUS W/ TWL LRG LVL3 (GOWN DISPOSABLE) ×1 IMPLANT
GOWN STRL REUS W/TWL LRG LVL3 (GOWN DISPOSABLE) ×2
HEMOVAC 400CC 10FR (MISCELLANEOUS) IMPLANT
HOOD PEEL AWAY FLYTE STAYCOOL (MISCELLANEOUS) ×3 IMPLANT
MAT BLUE FLOOR 46X72 FLO (MISCELLANEOUS) ×3 IMPLANT
NDL SAFETY 18GX1.5 (NEEDLE) ×3 IMPLANT
NEEDLE SPNL 18GX3.5 QUINCKE PK (NEEDLE) ×3 IMPLANT
NS IRRIG 1000ML POUR BTL (IV SOLUTION) ×3 IMPLANT
PACK HIP COMPR (MISCELLANEOUS) ×3 IMPLANT
SOL PREP PVP 2OZ (MISCELLANEOUS) ×3
SOLUTION PREP PVP 2OZ (MISCELLANEOUS) ×1 IMPLANT
STAPLER SKIN PROX 35W (STAPLE) ×3 IMPLANT
STRAP SAFETY BODY (MISCELLANEOUS) ×3 IMPLANT
SUT DVC 2 QUILL PDO  T11 36X36 (SUTURE) ×2
SUT DVC 2 QUILL PDO T11 36X36 (SUTURE) ×1 IMPLANT
SUT DVC QUILL MONODERM 30X30 (SUTURE) ×3 IMPLANT
SUT SILK 0 (SUTURE) ×2
SUT SILK 0 30XBRD TIE 6 (SUTURE) ×1 IMPLANT
SUT VIC AB 1 CT1 36 (SUTURE) ×3 IMPLANT
SYR 20CC LL (SYRINGE) ×3 IMPLANT
SYR 30ML LL (SYRINGE) ×3 IMPLANT
TAPE MICROFOAM 4IN (TAPE) ×3 IMPLANT
TOWEL OR 17X26 4PK STRL BLUE (TOWEL DISPOSABLE) ×3 IMPLANT
TUBE KAMVAC SUCTION (TUBING) ×3 IMPLANT

## 2016-06-24 NOTE — Anesthesia Procedure Notes (Signed)
Procedure Name: Intubation Date/Time: 06/24/2016 11:43 AM Performed by: Dionne Bucy Pre-anesthesia Checklist: Patient identified, Patient being monitored, Timeout performed, Emergency Drugs available and Suction available Patient Re-evaluated:Patient Re-evaluated prior to inductionOxygen Delivery Method: Circle system utilized Preoxygenation: Pre-oxygenation with 100% oxygen Intubation Type: IV induction Ventilation: Mask ventilation without difficulty Laryngoscope Size: Miller and 2 Grade View: Grade I Tube type: Oral Tube size: 7.0 mm Number of attempts: 1 Airway Equipment and Method: Stylet Placement Confirmation: ETT inserted through vocal cords under direct vision,  positive ETCO2 and breath sounds checked- equal and bilateral Secured at: 21 cm Tube secured with: Tape Dental Injury: Teeth and Oropharynx as per pre-operative assessment

## 2016-06-24 NOTE — Progress Notes (Signed)
[facility] Cardiology [facility] Encounter Note  Patient: Emily [name] / Admit Date: 06/21/2016 / Date of Encounter: 06/24/2016, 8:11 AM   Subjective: Patient has no evidence of chest discomfort. No rhythm disturbances or evidence of congestive heart failure. 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 of the apex with ejection fraction of 25-30% Cardiac catheterization shows continued apical ballooning syndrome with ejection fraction of 25-30% and normal coronary arteries with 0% stenosis  Review of Systems: Positive for: Leg pain Negative for: Vision change, hearing change, syncope, dizziness, nausea, vomiting,diarrhea, bloody stool, stomach pain, cough, congestion, diaphoresis, urinary frequency, urinary pain,skin lesions, skin rashes Others previously listed  Objective: Telemetry: Normal sinus rhythm Physical Exam: Blood pressure (!) 140/55, pulse 65, temperature 98 F (36.7 C), temperature source Oral, resp. rate 20, height 5\' 5"  (1.651 m), weight 108 lb 9.6 oz (49.3 kg), SpO2 98 %. Body mass index is 18.07 kg/m. General: Well developed, well nourished, in no acute distress. Head: Normocephalic, atraumatic, sclera non-icteric, no xanthomas, nares are without discharge. Neck: No apparent masses Lungs: Normal respirations with no wheezes, no rhonchi, no rales , no crackles   Heart: Regular rate and rhythm, normal S1 S2, no murmur, no rub, no gallop, PMI is normal size and placement, carotid upstroke normal without bruit, jugular venous pressure normal Abdomen: Soft, non-tender, non-distended with normoactive bowel sounds. No hepatosplenomegaly. Abdominal aorta is normal size without bruit Extremities: No edema, no clubbing, no cyanosis, no ulcers,  Peripheral: 2+ radial, 2+ femoral, 2+ dorsal pedal pulses Neuro: Alert and oriented. Moves all extremities spontaneously. Psych:  Responds to questions appropriately with a normal  affect.   Intake/Output Summary (Last 24 hours) at 06/24/16 0811 Last data filed at 06/24/16 0809  Gross per 24 hour  Intake              540 ml  Output              650 ml  Net             -110 ml    Inpatient Medications:  . [START ON 06/25/2016] aspirin EC  81 mg Oral Daily  .  ceFAZolin (ANCEF) IV  1 g Intravenous Once  . feeding supplement (ENSURE ENLIVE)  1 Bottle Oral TID BM  . FLUoxetine  10 mg Oral Daily  . megestrol  40 mg Oral BID  . metoprolol succinate  25 mg Oral Daily  . sodium chloride flush  3 mL Intravenous Q12H   Infusions:  . sodium chloride 40 mL/hr at 06/23/16 2147    Labs:  Recent Labs  06/23/16 0451 06/24/16 0516  NA 141 139  K 3.2* 3.6  CL 117* 115*  CO2 18* 19*  GLUCOSE 91 101*  BUN 32* 22*  CREATININE 0.90 0.94  CALCIUM 8.1* 8.0*  MG 1.6* 1.9    Recent Labs  06/21/16 1607  AST 76*  ALT 38  ALKPHOS 61  BILITOT 1.1  PROT 7.0  ALBUMIN 3.8    Recent Labs  06/23/16 0451 06/24/16 0746  WBC 5.2 6.2  HGB 11.0* 11.5*  HCT 32.2* 33.9*  MCV 93.3 94.3  PLT 121* 117*    Recent Labs  06/21/16 1607 06/21/16 1830 06/21/16 2254 06/22/16 0238  CKTOTAL 767*  --   --   --   TROPONINI 2.88* 3.58* 2.83* 2.63*   Invalid input(s): POCBNP No results for input(s): HGBA1C in the last 72 hours.   Weights: Filed Weights   06/21/16 1559  06/23/16 0505  Weight: 102 lb 4.8 oz (46.4 kg) 108 lb 9.6 oz (49.3 kg)     Radiology/Studies:  Dg Chest 1 View  Result Date: 06/21/2016 CLINICAL DATA:  Found down today. Right hip pain and dry mouth. Throat cancer. EXAM: CHEST 1 VIEW COMPARISON:  PET CT 05/14/2016.  Radiographs 01/01/2016. FINDINGS: 1702 hours. The heart size and mediastinal contours are stable. There is aortic atherosclerosis. Right subclavian Port-A-Cath tip extends to the SVC right atrial level. The lungs are clear. There is no pleural effusion or pneumothorax. No evidence of residual pneumoperitoneum. Stable old rib fractures or  thoracotomy defects bilaterally. Percutaneous G-tube noted. IMPRESSION: No active cardiopulmonary process. Electronically Signed   By: Richardean Sale M.D.   On: 06/21/2016 17:24   Ct Head Wo Contrast  Result Date: 06/21/2016 CLINICAL DATA:  82 year old female found down. Pain and dehydration. Recently completed treatment for throat cancer. Initial encounter. EXAM: CT HEAD WITHOUT CONTRAST CT CERVICAL SPINE WITHOUT CONTRAST TECHNIQUE: Multidetector CT imaging of the head and cervical spine was performed following the standard protocol without intravenous contrast. Multiplanar CT image reconstructions of the cervical spine were also generated. COMPARISON:  PET-CT 05/14/2016 and earlier. FINDINGS: CT HEAD FINDINGS Small volume retained secretions in the nasopharynx. Visualized paranasal sinuses and mastoids are well pneumatized. Calvarium intact. Osteopenia. No acute orbits soft tissue finding. No scalp hematoma identified. Extensive Calcified atherosclerosis at the skull base. Cerebral volume is within normal limits for age. Mild for age patchy bilateral nonspecific white matter hypodensity. No midline shift, ventriculomegaly, mass effect, evidence of mass lesion, intracranial hemorrhage or evidence of cortically based acute infarction. No suspicious intracranial vascular hyperdensity. CT CERVICAL SPINE FINDINGS Preserved cervical lordosis. Visualized skull base is intact. No atlanto-occipital dissociation. Cervicothoracic junction alignment is within normal limits. Bilateral posterior element alignment is within normal limits. Mild degenerative appearing spondylolisthesis at C5-C6 where there is chronic severe disc space loss and endplate spurring. No cervical spine fracture identified. Multilevel mild degenerative cervical spinal stenosis suspected. Visible upper thoracic levels appear intact. Negative lung apices aside from mild scarring. Calcified carotid atherosclerosis in the neck. Small volume retained  secretions in the hypopharynx. Otherwise negative noncontrast neck soft tissues. IMPRESSION: 1. No acute intracranial abnormality. Mild for age cerebral white matter changes most commonly due to chronic small vessel disease. 2. No acute fracture or listhesis identified in the cervical spine. Ligamentous injury is not excluded. 3. Mild retained secretions in the pharynx. Electronically Signed   By: Genevie Ann M.D.   On: 06/21/2016 17:39   Ct Cervical Spine Wo Contrast  Result Date: 06/21/2016 CLINICAL DATA:  82 year old female found down. Pain and dehydration. Recently completed treatment for throat cancer. Initial encounter. EXAM: CT HEAD WITHOUT CONTRAST CT CERVICAL SPINE WITHOUT CONTRAST TECHNIQUE: Multidetector CT imaging of the head and cervical spine was performed following the standard protocol without intravenous contrast. Multiplanar CT image reconstructions of the cervical spine were also generated. COMPARISON:  PET-CT 05/14/2016 and earlier. FINDINGS: CT HEAD FINDINGS Small volume retained secretions in the nasopharynx. Visualized paranasal sinuses and mastoids are well pneumatized. Calvarium intact. Osteopenia. No acute orbits soft tissue finding. No scalp hematoma identified. Extensive Calcified atherosclerosis at the skull base. Cerebral volume is within normal limits for age. Mild for age patchy bilateral nonspecific white matter hypodensity. No midline shift, ventriculomegaly, mass effect, evidence of mass lesion, intracranial hemorrhage or evidence of cortically based acute infarction. No suspicious intracranial vascular hyperdensity. CT CERVICAL SPINE FINDINGS Preserved cervical lordosis. Visualized skull base is intact.  No atlanto-occipital dissociation. Cervicothoracic junction alignment is within normal limits. Bilateral posterior element alignment is within normal limits. Mild degenerative appearing spondylolisthesis at C5-C6 where there is chronic severe disc space loss and endplate spurring.  No cervical spine fracture identified. Multilevel mild degenerative cervical spinal stenosis suspected. Visible upper thoracic levels appear intact. Negative lung apices aside from mild scarring. Calcified carotid atherosclerosis in the neck. Small volume retained secretions in the hypopharynx. Otherwise negative noncontrast neck soft tissues. IMPRESSION: 1. No acute intracranial abnormality. Mild for age cerebral white matter changes most commonly due to chronic small vessel disease. 2. No acute fracture or listhesis identified in the cervical spine. Ligamentous injury is not excluded. 3. Mild retained secretions in the pharynx. Electronically Signed   By: Genevie Ann M.D.   On: 06/21/2016 17:39   Dg Hip Unilat W Or Wo Pelvis 2-3 Views Right  Result Date: 06/21/2016 CLINICAL DATA:  Pain after fall EXAM: DG HIP (WITH OR WITHOUT PELVIS) 2-3V RIGHT COMPARISON:  None. FINDINGS: There is a fracture through the right femoral neck. Laterally and superiorly, the fracture extends into the subcapital region. No dislocation identified. No other acute abnormalities. IMPRESSION: Fracture through the right femoral neck extending into the subcapital region superiorly and laterally. No dislocation. Electronically Signed   By: Dorise Bullion III M.D   On: 06/21/2016 17:18     Assessment and Recommendation  82 y.o. female with no history of cardiovascular disease who is had a fall with a fracture and unable to get up and in distress for one to 2 days likely having apical ballooning syndrome due to the stress with non-ST elevation myocardial infarction with a cardiac catheterization showing normal coronary arteries 1. No need for plavix at this time due to concerns of surgery bleeding 2. No further cardiac intervention at this time due to normal coronary arteries 3. Beta blocker for heart rate control and cardiomyopathy from apical ballooning syndrome and consider ACE inhibitor depending on ability for addition and whether  blood pressure well allow 4. Proceed to orthopedic surgery for repair of fractured hip. Would consider spinal anesthesia if able 5. Diligent assessment and treatment of possible fluid loading with surgery watching for any congestive heart failure 6. No restrictions to surgical intervention and/or rehabilitation  Signed, Serafina Royals M.D. FACC

## 2016-06-24 NOTE — Progress Notes (Signed)
Spoke with OR staff patient to take metoprolol this am prior to surgery, also patient needs to have CHG wipes done prior to going to OR.

## 2016-06-24 NOTE — Progress Notes (Signed)
SLP Cancellation Note  Patient Details Name: Emily [name] MRN: [id number] DOB: 07-17-1933   Cancelled treatment:  Pt is currently NPO for possible surgery today. Consulted NSG re: pt's status and recommended (general) aspiration and Reflux precautions when eating/drinking and swallowing pills. ST services will f/u w/ pt tomorrow.         Santa Abdelrahman 06/24/2016, 9:32 AM

## 2016-06-24 NOTE — Op Note (Signed)
06/21/2016 - 06/24/2016  1:19 PM  PATIENT:  [name]  82 y.o. female  PRE-OPERATIVE DIAGNOSIS:  right hip fracture, femoral neck with arthritis  POST-OPERATIVE DIAGNOSIS:  right hip fracture, femoral neck with arthritis  PROCEDURE:  Procedure(s): TOTAL HIP ARTHROPLASTY ANTERIOR APPROACH (Right)  SURGEON: Laurene Footman, MD  ASSISTANTS: None  ANESTHESIA:   general  EBL:  Total I/O In: 950 [I.V.:950] Out: 900 [Urine:750; Blood:150]  BLOOD ADMINISTERED:none  DRAINS: none   LOCAL MEDICATIONS USED:  MARCAINE     SPECIMEN:  Source of Specimen:  Femoral head  DISPOSITION OF SPECIMEN:  PATHOLOGY  COUNTS:  YES  TOURNIQUET:  * No tourniquets in log *  IMPLANTS: Medacta AMIS 3 standard STEM with 50 mm Mpact cup DM and liner with S 28 mm head  DICTATION: .Dragon Dictation   The patient was brought to the operating room and after general anesthesia was obtained patient was placed on the operative table with the ipsilateral foot into the Medacta attachment, contralateral leg on a well-padded table. C-arm was brought in and preop template x-ray taken. After prepping and draping in usual sterile fashion appropriate patient identification and timeout procedures were completed. Anterior approach to the hip was obtained and centered over the greater trochanter and TFL muscle. The subcutaneous tissue was incised hemostasis being achieved by electrocautery. TFL fascia was incised and the muscle retracted laterally deep retractor placed. The lateral femoral circumflex vessels were identified and ligated. The anterior capsule was exposed and a capsulotomy performed. The neck was identified and a femoral neck cut carried out below the fracture which is essentially subcapital with a saw. The head was removed without difficulty and showed sclerotic femoral head and acetabulum. Reaming was carried out to 50 mm and a 50 mm cup trial gave appropriate tightness to the acetabular component a 50 cup  was impacted into position. The leg was then externally rotated and ischiofemoral and pubolofemoral releases carried out. The femur was sequentially broached to a size 3, size 3 standard stem with S head trials were placed and the final components chosen. The 3 standard stem was inserted along with a S 28 mm head and 50 mm liner. The hip was reduced and was stable the wound was thoroughly irrigated. The deep fascia was closed using a heavy Quill after infiltration of 30 cc of quarter percent Sensorcaine with epinephrine. 2-0 Quill to close the skin with skin staples. Xeroform and honeycomb dressing applied  PLAN OF CARE: Continue as inpatient

## 2016-06-24 NOTE — Plan of Care (Signed)
Problem: SLP Dysphagia Goals Goal: Misc Dysphagia Goal Pt will safely tolerate po diet of least restrictive consistency w/ no overt s/s of aspiration noted by Staff/pt/family x3 sessions.    

## 2016-06-24 NOTE — Progress Notes (Signed)
[facility] PROGRESS NOTE  CHARNIKA HUNTSBERRY [id number] DOB: 12-26-1932 DOA: 06/21/2016 PCP: Lelon Huh, MD  HPI/Subjective: Patient offers no complaints. Only answers yes or no questions and does not elaborate  Objective: Vitals:   06/24/16 0509 06/24/16 0811  BP: (!) 140/55 (!) 133/58  Pulse: 65 70  Resp: 20 19  Temp: 98 F (36.7 C) 98 F (36.7 C)    Filed Weights   06/21/16 1559 06/23/16 0505  Weight: 46.4 kg (102 lb 4.8 oz) 49.3 kg (108 lb 9.6 oz)    ROS: Review of Systems  Constitutional: Negative for chills and fever.  Eyes: Negative for blurred vision.  Respiratory: Negative for cough and shortness of breath.   Cardiovascular: Negative for chest pain.  Gastrointestinal: Positive for constipation. Negative for abdominal pain, diarrhea, nausea and vomiting.  Genitourinary: Negative for dysuria.  Musculoskeletal: Negative for joint pain.  Neurological: Negative for dizziness and headaches.   Exam: Physical Exam  Constitutional: She is oriented to person, place, and time.  HENT:  Nose: No mucosal edema.  Mouth/Throat: No oropharyngeal exudate or posterior oropharyngeal edema.  Eyes: Conjunctivae, EOM and lids are normal. Pupils are equal, round, and reactive to light.  Neck: No JVD present. Carotid bruit is not present. No edema present. No thyroid mass and no thyromegaly present.  Cardiovascular: S1 normal and S2 normal.  Exam reveals no gallop.   Murmur heard.  Systolic murmur is present with a grade of 2/6  Pulses:      Dorsalis pedis pulses are 2+ on the right side, and 2+ on the left side.  Respiratory: No respiratory distress. She has no wheezes. She has no rhonchi. She has no rales.  GI: Soft. Bowel sounds are normal. There is no tenderness.  Musculoskeletal:       Right ankle: She exhibits no swelling.       Left ankle: She exhibits no swelling.  Lymphadenopathy:    She has no cervical adenopathy.  Neurological: She is alert and oriented to  person, place, and time. No cranial nerve deficit.  Skin: Skin is warm. No rash noted. Nails show no clubbing.  Bruising, scabs left face. As per nursing staff stage 2 decubiti buttock and stage 1 on ankle present on admission  Psychiatric: She has a normal mood and affect.      Data Reviewed: Basic Metabolic Panel:  Recent Labs Lab 06/21/16 1607 06/22/16 0238 06/23/16 0451 06/24/16 0516  NA 142 143 141 139  K 3.7 3.0* 3.2* 3.6  CL 107 114* 117* 115*  CO2 24 22 18* 19*  GLUCOSE 132* 106* 91 101*  BUN 41* 37* 32* 22*  CREATININE 1.24* 0.92 0.90 0.94  CALCIUM 10.6* 9.1 8.1* 8.0*  MG  --   --  1.6* 1.9   Liver Function Tests:  Recent Labs Lab 06/21/16 1607  AST 76*  ALT 38  ALKPHOS 61  BILITOT 1.1  PROT 7.0  ALBUMIN 3.8   CBC:  Recent Labs Lab 06/21/16 1607 06/22/16 0238 06/23/16 0451 06/24/16 0746  WBC 9.5 8.0 5.2 6.2  HGB 15.5 13.2 11.0* 11.5*  HCT 44.5 38.9 32.2* 33.9*  MCV 93.5 93.3 93.3 94.3  PLT 150 123* 121* 117*   Cardiac Enzymes:  Recent Labs Lab 06/21/16 1607 06/21/16 1830 06/21/16 2254 06/22/16 0238  CKTOTAL 767*  --   --   --   TROPONINI 2.88* 3.58* 2.83* 2.63*    Studies: No results found.  Scheduled Meds: . [START ON 06/25/2016] aspirin EC  81 mg Oral Daily  .  ceFAZolin (ANCEF) IV  1 g Intravenous Once  . Chlorhexidine Gluconate Cloth  6 each Topical Q0600  . feeding supplement (ENSURE ENLIVE)  1 Bottle Oral TID BM  . FLUoxetine  10 mg Oral Daily  . megestrol  40 mg Oral BID  . metoprolol succinate  25 mg Oral Daily  . sodium chloride flush  3 mL Intravenous Q12H   Continuous Infusions: . sodium chloride 40 mL/hr at 06/23/16 2147    Assessment/Plan:  1. NSTEMI- cardiac cath negative. Patient has cardiomyopathy on echo. Aspirin, Toprol. Consideration for ace inhibitor tomorrow. Can procede with surgery. 2. Right hip fracture- patient moderate risk for surgery with cardiac cath being negative.  Need to do surgery in order  to avoid complications such as pneumonia, decubiti, blood clots. High mortality within the first year of hip fracture.  Defer to orthopedic surgery and anesthesia to answer son's questions on type of procedure and anesthesia. 3. Hx colon cancer, Hx larygeal cancer 4. Depression- fluoxetine 5. Hypomagnesemia. replaced 6. Hypokalemia replace yesterday 7. Constipation. We'll get more aggressive with bowel regimen once hip surgery done. 8. Spoke with family at bedside. Head nurse supervisor at the bedside with me when I spoke with family.   Code Status:     Code Status Orders        Start     Ordered   06/21/16 2030  Do not attempt resuscitation (DNR)  Continuous    Question Answer Comment  In the event of cardiac or respiratory ARREST Do not call a "code blue"   In the event of cardiac or respiratory ARREST Do not perform Intubation, CPR, defibrillation or ACLS   In the event of cardiac or respiratory ARREST Use medication by any route, position, wound care, and other measures to relive pain and suffering. May use oxygen, suction and manual treatment of airway obstruction as needed for comfort.      06/21/16 2029    Code Status History    Date Active Date Inactive Code Status Order ID Comments User Context   06/21/2016  8:29 PM 06/22/2016  9:00 AM DNR [id number]  Vaughan Basta, MD Inpatient    Questions for Most Recent Historical Code Status (Order [id number])    Question Answer Comment   In the event of cardiac or respiratory ARREST Do not call a "code blue"    In the event of cardiac or respiratory ARREST Do not perform Intubation, CPR, defibrillation or ACLS    In the event of cardiac or respiratory ARREST Use medication by any route, position, wound care, and other measures to relive pain and suffering. May use oxygen, suction and manual treatment of airway obstruction as needed for comfort.         Advance Directive Documentation   Flowsheet Row Most Recent Value  Type of  Advance Directive  Healthcare Power of [place], [place] Directive  Pre-existing out of facility DNR order (yellow form or pink MOST form)  No data  "MOST" Form in Place?  No data     Family Communication: Family at bedside Disposition Plan: Will need rehabilitation after hip surgery (likely Saturday if all goes well postoperatively)  Consultants:  Cardiology  Orthopedic surgery  Time spent: 28 minutes  [place], Why

## 2016-06-24 NOTE — Progress Notes (Signed)
Patient being transferred to 137. Report given to nurse. Telemetry was returned to floor. Patient will have off unit telemetry monitoring.

## 2016-06-24 NOTE — Transfer of Care (Signed)
Immediate Anesthesia Transfer of Care Note  Patient: Emily [name]  Procedure(s) Performed: Procedure(s): TOTAL HIP ARTHROPLASTY ANTERIOR APPROACH (Right)  Patient Location: PACU  Anesthesia Type:General  Level of Consciousness: sedated  Airway & Oxygen Therapy: Patient Spontanous Breathing and Patient connected to face mask oxygen  Post-op Assessment: Report given to RN and Post -op Vital signs reviewed and stable  Post vital signs: Reviewed and stable  Last Vitals:  Vitals:   06/24/16 1017 06/24/16 1312  BP: (!) 136/59 144/60  Pulse: 65 78  Resp: 16 16  Temp: (!) 36.1 C (P) 36.6 C    Last Pain:  Vitals:   06/24/16 1017  TempSrc: Tympanic  PainSc: 0-No pain      Patients Stated Pain Goal: 0 ([id number])  Complications: No apparent anesthesia complications

## 2016-06-24 NOTE — Progress Notes (Signed)
A&O with periods of confusion and forgetfulness. Bedbound and incontinent. Painful to move due to broken right hip. For surgery of hip today. Cardiac cath done yesterday with no interventions.

## 2016-06-24 NOTE — Anesthesia Preprocedure Evaluation (Addendum)
Anesthesia Evaluation  Patient identified by MRN, date of birth, ID band Patient awake    Reviewed: Allergy & Precautions, NPO status , Patient's Chart, lab work & pertinent test results  History of Anesthesia Complications (+) DIFFICULT AIRWAYHistory of anesthetic complications: previous intubation record reviewed, grade 2 with miller 2 blade.  Airway Mallampati: II  TM Distance: >3 FB Neck ROM: Full    Dental  (+) Upper Dentures, Partial Lower   Pulmonary neg COPD, Current Smoker,    breath sounds clear to auscultation- rhonchi (-) wheezing      Cardiovascular Exercise Tolerance: Poor hypertension, Pt. on medications and Pt. on home beta blockers + Past MI ( NSTEMI, cath showed no stenosis)  (-) Cardiac Stents and (-) CABG  Rhythm:Regular Rate:Normal - Systolic murmurs and - Diastolic murmurs L heart cath 06/23/16: Left ventricular angiogram shows abnormal LV systolic function ejection fraction of 30% with apical ballooning  no  coronary artery disease with 0% stenosis of coronary arteries  There is significant LV systolic dysfunction consistent with apical ballooning syndrome likely due to the stress of current illness and event with fall and hip fracture  Echo 06/22/16: Left ventricle: The cavity size was moderately dilated. Systolic   function was severely reduced. The estimated ejection fraction   was in the range of 25% to 30%. Akinesis of the apical   myocardium. - Aortic valve: Valve area (Vmax): 2.07 cm^2.   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[place]) No date: Colon cancer ([place]) No date: Difficult intubation No date: Diverticulosis No date: Hemorrhoids No date: History of chicken pox No date: Hypertension No date: Osteopenia No date: Status post chemotherapy     Comment: colon cancer No date: Status post radiation therapy     Comment: colon cancer 2013   Reproductive/Obstetrics                            Anesthesia Physical Anesthesia Plan  ASA: III  Anesthesia Plan: General   Post-op Pain Management:    Induction: Intravenous  Airway Management Planned: Oral ETT  Additional Equipment:   Intra-op Plan:   Post-operative Plan: Extubation in OR  Informed Consent: I have reviewed the patients History and Physical, chart, labs and discussed the procedure including the risks, benefits and alternatives for the proposed anesthesia with the patient or authorized representative who has indicated his/her understanding and acceptance.   Dental advisory given  Plan Discussed with: CRNA and Anesthesiologist  Anesthesia Plan Comments: (Discussed at length risks of post op cognitive dysfunction with patient and family. Discussed that the incidence is similar with both regional and general anesthetic techniques. Given pain with movement will plan to proceed with general anesthesia. Plan to have video laryngoscope available given hx of laryngeal cancer s/p radiation.)        Lab Results  Component Value Date   WBC 6.2 06/24/2016   HGB 11.5 (L) 06/24/2016  HCT 33.9 (L) 06/24/2016   MCV 94.3 06/24/2016   PLT 117 (L) 06/24/2016    Anesthesia Quick Evaluation

## 2016-06-24 NOTE — Progress Notes (Signed)
Pt arrived from PACU via monitored bed at approx 1600. Pt is awake, alert, answers questions. Skin is pale, pt is wearing glasses. O2 is on at [facility], lungs are clear bilat, decreased to the bases with shallow respirations. Telebox is placed, Sr noted. Pt has unaccesssed port intact to R chest wall, pt also has peg tube intact to abdomen, last used during pt's treatment for throat Ca, last chemo in May,2017. Bs hypoactive, abdomen is soft. Pt has honeycomb dressing intact to R hip, no discolroation or edema noted.  Foley intaqct and draining yellow urine. Pt has dressing to coccyx intact, and dressing to R ankle intact as well, bilat TEDs in place, foot pumps acitvated. PIV#20 intact to L fa and to R fa as well, both sites are free of redness and swelling. Pt denied pain, denied nausea at time of arrival, family members at bedside. Per family members, pt has had poor appetite at home prior to this incident, and they feel pt may need staff encouragement to eat. HOB is elevated, srx2, call bell in reach.

## 2016-06-24 NOTE — NC FL2 (Signed)
[place] LEVEL OF CARE SCREENING TOOL     IDENTIFICATION  Patient Name: Emily [name] Birthdate: 02-06-1933 Sex: female Admission Date (Current Location): 06/21/2016  [place] and [place] Number:  Engineering geologist and Address:  [facility], [address] [place], [place] [address]      Provider Number: [id number]  Attending Physician Name and Address:  Loletha Grayer, MD  Relative Name and Phone Number:       Current Level of Care: Hospital Recommended Level of Care: [place] Prior Approval Number:    Date Approved/Denied:   PASRR Number:    Discharge Plan: SNF    Current Diagnoses: Patient Active Problem List   Diagnosis Date Noted  . Pressure ulcer 06/23/2016  . NSTEMI (non-ST elevated myocardial infarction) ([place]) 06/21/2016  . Closed right hip fracture ([place]) 06/21/2016  . Ischemic chest pain ([place]) 06/21/2016  . Dysphagia 06/02/2016  . 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 Throat pain 11/27/2015  . History of colon cancer 05/09/2015  . Diverticulosis 05/09/2015  . Fatigue 05/09/2015  . Hemorrhoid 05/09/2015  . Hypertension 05/09/2015  . Insomnia 05/09/2015  . Osteopenia 05/09/2015  . Skin lesion of back 05/09/2015  . Tobacco abuse 05/09/2015  . Vitamin D deficiency 05/09/2015  . Ileostomy present ([place]) 09/27/2013  . Acid reflux 06/13/2013  . Arthritis, degenerative 06/13/2013  . Disuse syndrome 06/13/2013  . Malignant neoplasm of rectum ([place]) 01/31/2013  . Rectal cancer ([place]) 01/20/2013    Orientation RESPIRATION BLADDER Height & Weight     Self  O2 (2 Liters Oxygen ) Continent Weight: 108 lb 9.6 oz (49.3 kg) Height:  5\' 5"  (165.1 cm)  BEHAVIORAL SYMPTOMS/MOOD NEUROLOGICAL BOWEL NUTRITION STATUS   (none )  (none) Continent Feeding tube (PEG)  AMBULATORY STATUS COMMUNICATION OF NEEDS Skin   Extensive Assist  Verbally PU Stage and Appropriate Care, Surgical wounds (Pressure Ulcer Stage 1: Left Ankle. Pressure Ulcer Stage 2: Sacrum.)                       Personal Care Assistance Level of Assistance  Bathing, Feeding, Dressing Bathing Assistance: Limited assistance Feeding assistance: Independent Dressing Assistance: Limited assistance     Functional Limitations Info  Sight, Hearing, Speech Sight Info: Adequate Hearing Info: Adequate Speech Info: Adequate    SPECIAL CARE FACTORS FREQUENCY  PT (By licensed PT), OT (By licensed OT)     PT Frequency:  (5) OT Frequency:  (5)            Contractures      Additional Factors Info  Code Status, Allergies Code Status Info:  (DNR ) Allergies Info:  (Amlodipine Besylate, Oxycodone)           Current Medications (06/24/2016):  This is the current hospital active medication list Current Facility-Administered Medications  Medication Dose Route Frequency Provider Last Rate Last Dose  . 0.9 %  sodium chloride infusion   Intravenous Continuous Loletha Grayer, MD 40 mL/hr at 06/23/16 2147    . 0.9 %  sodium chloride infusion   Intravenous Continuous Hessie Knows, MD      . acetaminophen (TYLENOL) tablet 1,000 mg  1,000 mg Oral Q6H Hessie Knows, MD      . acetaminophen (TYLENOL) tablet 650 mg  650 mg Oral Q6H PRN Bettey Costa, MD   650 mg at 06/22/16 1042  . alum & mag  hydroxide-simeth (MAALOX/MYLANTA) 200-200-20 MG/5ML suspension 30 mL  30 mL Oral Q4H PRN Hessie Knows, MD      . Derrill Memo ON 06/25/2016] aspirin EC tablet 81 mg  81 mg Oral Daily Loletha Grayer, MD      . bisacodyl (DULCOLAX) suppository 10 mg  10 mg Rectal Daily PRN Hessie Knows, MD      . ceFAZolin (ANCEF) IVPB 1 g/50 mL premix  1 g Intravenous Q6H Hessie Knows, MD      . diphenhydrAMINE (BENADRYL) 12.5 MG/5ML elixir 12.5-25 mg  12.5-25 mg Oral Q4H PRN Hessie Knows, MD      . docusate sodium (COLACE) capsule 100 mg  100 mg Oral BID Hessie Knows, MD      . Derrill Memo ON  06/25/2016] enoxaparin (LOVENOX) injection 40 mg  40 mg Subcutaneous Q24H Hessie Knows, MD      . feeding supplement (ENSURE ENLIVE) (ENSURE ENLIVE) liquid 237 mL  1 Bottle Oral TID BM Loletha Grayer, MD   237 mL at 06/23/16 2140  . fentaNYL (SUBLIMAZE) 100 MCG/2ML injection           . FLUoxetine (PROZAC) capsule 10 mg  10 mg Oral Daily Bettey Costa, MD   10 mg at 06/23/16 1547  . HYDROcodone-acetaminophen (NORCO/VICODIN) 5-325 MG per tablet 1-2 tablet  1-2 tablet Oral Q4H PRN Hessie Knows, MD      . magnesium citrate solution 1 Bottle  1 Bottle Oral Once PRN Hessie Knows, MD      . magnesium hydroxide (MILK OF MAGNESIA) suspension 30 mL  30 mL Oral Daily PRN Hessie Knows, MD      . megestrol (MEGACE) tablet 40 mg  40 mg Oral BID Bettey Costa, MD   40 mg at 06/23/16 2146  . menthol-cetylpyridinium (CEPACOL) lozenge 3 mg  1 lozenge Oral PRN Hessie Knows, MD       Or  . phenol [facility]) mouth spray 1 spray  1 spray Mouth/Throat PRN Hessie Knows, MD      . metoprolol succinate (TOPROL-XL) 24 hr tablet 25 mg  25 mg Oral Daily Bettey Costa, MD   25 mg at 06/24/16 1004  . morphine 2 MG/ML injection 1 mg  1 mg Intravenous Q3H PRN Vaughan Basta, MD   1 mg at 06/24/16 0255  . morphine 2 MG/ML injection 2 mg  2 mg Intravenous Q2H PRN Hessie Knows, MD      . nitroGLYCERIN (NITROSTAT) SL tablet 0.4 mg  0.4 mg Sublingual Q5 min PRN Vaughan Basta, MD      . nystatin (MYCOSTATIN) 100000 UNIT/ML suspension 500,000 Units  5 mL Oral QID Hessie Knows, MD      . ondansetron [facility]) tablet 4 mg  4 mg Oral Q6H PRN Hessie Knows, MD       Or  . ondansetron [facility]) injection 4 mg  4 mg Intravenous Q6H PRN Hessie Knows, MD      . sodium chloride flush (NS) 0.9 % injection 3 mL  3 mL Intravenous Q12H Vaughan Basta, MD   3 mL at 06/23/16 2200   Facility-Administered Medications Ordered in Other Encounters  Medication Dose Route Frequency Provider Last Rate Last Dose  . 0.9 %  sodium chloride  infusion   Intravenous Once Mayra Reel, NP         Discharge Medications: Please see discharge summary for a list of discharge medications.  Relevant Imaging Results:  Relevant Lab Results:   Additional Information  (SSN: [id number])  Abygayle Deltoro, Veronia Beets, LCSW

## 2016-06-25 ENCOUNTER — Encounter: Payer: Self-pay | Admitting: Orthopedic Surgery

## 2016-06-25 LAB — BASIC METABOLIC PANEL
ANION GAP: 10 (ref 5–15)
BUN: 28 mg/dL — ABNORMAL HIGH (ref 6–20)
CALCIUM: 8.1 mg/dL — AB (ref 8.9–10.3)
CO2: 17 mmol/L — AB (ref 22–32)
Chloride: 111 mmol/L (ref 101–111)
Creatinine, Ser: 1.32 mg/dL — ABNORMAL HIGH (ref 0.44–1.00)
GFR calc Af Amer: 43 mL/min — ABNORMAL LOW (ref >60)
GFR calc non Af Amer: 37 mL/min — ABNORMAL LOW (ref >60)
GLUCOSE: 144 mg/dL — AB (ref 65–99)
Potassium: 3.7 mmol/L (ref 3.5–5.1)
Sodium: 138 mmol/L (ref 135–145)

## 2016-06-25 LAB — MAGNESIUM: Magnesium: 1.8 mg/dL (ref 1.7–2.4)

## 2016-06-25 MED ORDER — ENOXAPARIN SODIUM 30 MG/0.3ML ~~LOC~~ SOLN
30.0000 mg | SUBCUTANEOUS | Status: DC
  Administered 2016-06-26: 30 mg via SUBCUTANEOUS
  Filled 2016-06-25: qty 0.3

## 2016-06-25 MED ORDER — LACTULOSE 10 GM/15ML PO SOLN
20.0000 g | Freq: Every day | ORAL | Status: DC | PRN
Start: 2016-06-25 — End: 2016-06-27

## 2016-06-25 NOTE — Progress Notes (Signed)
Speech Therapy Note: reviewed chart, consulted NSG re: pt's status today. Pt has been swallowing pills w/ water and eating some of her meals w/ no overt s/s of aspiration noted by [facility] staff. Met w/ pt who had eaten some of her lunch meal. Pt denied any difficulty swallowing "as long as the food is soft". This was stated by pt at the BSE. (Pt sipping on her drink during meeting w/ no overt s/s of aspiration noted.) Briefly reviewed w/ pt to request foods that are cooked and soft, fruits and vegetables, soups and some puree foods would be best. Pt agreed. NSG staff to assist pt in ordering these types of foods if family is not present to do so.  ST services will be available for any further education while pt is admitted. No skilled ST services indicated at this time as pt appears at her baseline(throat Ca w/ Radiation tx impact) w/ her swallowing. NSG agreed.

## 2016-06-25 NOTE — Progress Notes (Signed)
Clinical Social Worker (CSW) contacted patient's son [name] and presented bed offers. Per son he will call CSW back tomorrow with bed choice. CSW will continue to follow and assist as needed.   [employer], LCSW [phone]

## 2016-06-25 NOTE — Progress Notes (Signed)
   Subjective: 1 Day Post-Op Procedure(s) (LRB): TOTAL HIP ARTHROPLASTY ANTERIOR APPROACH (Right) Patient reports pain as mild.   Patient is well, and has had no acute complaints or problems Denies any CP, SOB, ABD pain. We will continue therapy today.    Objective: Vital signs in last 24 hours: Temp:  [95.5 F (35.3 C)-98.6 F (37 C)] 95.5 F (35.3 C) (08/24 0749) Pulse Rate:  [65-101] 66 (08/24 0749) Resp:  [7-19] 16 (08/24 0749) BP: (102-151)/(40-65) 102/48 (08/24 0749) SpO2:  [90 %-100 %] 100 % (08/24 0749)  Intake/Output from previous day: 08/23 0701 - 08/24 0700 In: 2655.2 [P.O.:360; I.V.:2195.2; IV Piggyback:100] Out: 1450 [Urine:1300; Blood:150] Intake/Output this shift: No intake/output data recorded.   Recent Labs  06/23/16 0451 06/24/16 0746  HGB 11.0* 11.5*    Recent Labs  06/23/16 0451 06/24/16 0746  WBC 5.2 6.2  RBC 3.45* 3.59*  HCT 32.2* 33.9*  PLT 121* 117*    Recent Labs  06/24/16 0516 06/25/16 0409  NA 139 138  K 3.6 3.7  CL 115* 111  CO2 19* 17*  BUN 22* 28*  CREATININE 0.94 1.32*  GLUCOSE 101* 144*  CALCIUM 8.0* 8.1*   No results for input(s): LABPT, INR in the last 72 hours.  EXAM General - Patient is Alert, Appropriate and Oriented Extremity - Neurovascular intact Sensation intact distally Intact pulses distally Dorsiflexion/Plantar flexion intact No cellulitis present Compartment soft Dressing - dressing C/D/I and no drainage Motor Function - intact, moving foot and toes well on exam.   Past Medical History:  Diagnosis Date  . Arthritis    fingers  . Cancer ([place])    rectal  . Cancer ([place]) 2012   colon cancer  . 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 Colon cancer ([place])   . Difficult intubation   . Diverticulosis   . Hemorrhoids   . History of chicken pox   . Hypertension   . Osteopenia   . Status post chemotherapy    colon cancer  . Status post radiation therapy    colon cancer 2013     Assessment/Plan:   1 Day Post-Op Procedure(s) (LRB): TOTAL HIP ARTHROPLASTY ANTERIOR APPROACH (Right) Principal Problem:   Ischemic chest pain ([facility]) Active Problems:   NSTEMI (non-ST elevated myocardial infarction) ([place])   Closed right hip fracture ([facility])   Pressure ulcer  Estimated body mass index is 18.07 kg/m as calculated from the following:   Height as of this encounter: 5\' 5"  (1.651 m).   Weight as of this encounter: 49.3 kg (108 lb 9.6 oz). Advance diet Up with therapy  Needs BM Pain controlled, continue with current pain meds CBC pending  DVT Prophylaxis - Lovenox, Foot Pumps and TED hose Weight-Bearing as tolerated to right leg   T. Rachelle Hora, PA-C [place] 06/25/2016, 8:21 AM

## 2016-06-25 NOTE — Progress Notes (Signed)
Patient ID: Emily [name], female   DOB: 02-14-1933, 82 y.o.   MRN: [id number]  [facility] PROGRESS NOTE  Emily [name] [id number] DOB: 1933-09-19 DOA: 06/21/2016 PCP: Lelon Huh, MD  HPI/Subjective: Patient seen earlier along with the nurse. Patient complained of pain in the right hip and back area. She was just changed from a bath at that time. Offers no complaints of chest pain or shortness of breath.  Objective: Vitals:   06/25/16 0346 06/25/16 0749  BP: (!) 108/40 (!) 102/48  Pulse: (!) 101 66  Resp: 17 16  Temp: 98.6 F (37 C) (!) 96.5 F (35.8 C)    Filed Weights   06/21/16 1559 06/23/16 0505  Weight: 46.4 kg (102 lb 4.8 oz) 49.3 kg (108 lb 9.6 oz)    ROS: Review of Systems  Constitutional: Negative for chills and fever.  Eyes: Negative for blurred vision.  Respiratory: Negative for cough and shortness of breath.   Cardiovascular: Negative for chest pain.  Gastrointestinal: Positive for constipation. Negative for abdominal pain, diarrhea, nausea and vomiting.  Genitourinary: Negative for dysuria.  Musculoskeletal: Negative for joint pain.  Neurological: Negative for dizziness and headaches.   Exam: Physical Exam  Constitutional: She is oriented to person, place, and time.  HENT:  Nose: No mucosal edema.  Mouth/Throat: No oropharyngeal exudate or posterior oropharyngeal edema.  Eyes: Conjunctivae, EOM and lids are normal. Pupils are equal, round, and reactive to light.  Neck: No JVD present. Carotid bruit is not present. No edema present. No thyroid mass and no thyromegaly present.  Cardiovascular: S1 normal and S2 normal.  Exam reveals no gallop.   Murmur heard.  Systolic murmur is present with a grade of 2/6  Pulses:      Dorsalis pedis pulses are 2+ on the right side, and 2+ on the left side.  Respiratory: No respiratory distress. She has no wheezes. She has no rhonchi. She has no rales.  GI: Soft. Bowel sounds are normal. There is no  tenderness.  Musculoskeletal:       Right ankle: She exhibits no swelling.       Left ankle: She exhibits no swelling.  Lymphadenopathy:    She has no cervical adenopathy.  Neurological: She is alert and oriented to person, place, and time. No cranial nerve deficit.  Skin: Skin is warm. No rash noted. Nails show no clubbing.  Bruising, scabs left face. As per nursing staff stage 2 decubiti buttock and stage 1 on ankle present on admission  Psychiatric: She has a normal mood and affect.      Data Reviewed: Basic Metabolic Panel:  Recent Labs Lab 06/21/16 1607 06/22/16 0238 06/23/16 0451 06/24/16 0516 06/25/16 0409  NA 142 143 141 139 138  K 3.7 3.0* 3.2* 3.6 3.7  CL 107 114* 117* 115* 111  CO2 24 22 18* 19* 17*  GLUCOSE 132* 106* 91 101* 144*  BUN 41* 37* 32* 22* 28*  CREATININE 1.24* 0.92 0.90 0.94 1.32*  CALCIUM 10.6* 9.1 8.1* 8.0* 8.1*  MG  --   --  1.6* 1.9 1.8   Liver Function Tests:  Recent Labs Lab 06/21/16 1607  AST 76*  ALT 38  ALKPHOS 61  BILITOT 1.1  PROT 7.0  ALBUMIN 3.8   CBC:  Recent Labs Lab 06/21/16 1607 06/22/16 0238 06/23/16 0451 06/24/16 0746  WBC 9.5 8.0 5.2 6.2  HGB 15.5 13.2 11.0* 11.5*  HCT 44.5 38.9 32.2* 33.9*  MCV 93.5 93.3 93.3 94.3  PLT  150 123* 121* 117*   Cardiac Enzymes:  Recent Labs Lab 06/21/16 1607 06/21/16 1830 06/21/16 2254 06/22/16 0238  CKTOTAL 767*  --   --   --   TROPONINI 2.88* 3.58* 2.83* 2.63*    Studies: Dg Hip Operative Unilat W Or W/o Pelvis Right  Result Date: 06/24/2016 CLINICAL DATA:  Right hip replacement EXAM: OPERATIVE RIGHT HIP WITH PELVIS COMPARISON:  None. FLUOROSCOPY TIME:  Radiation Exposure Index (as provided by the fluoroscopic device): 2 mGy If the device does not provide the exposure index: Fluoroscopy Time:  12 seconds Number of Acquired Images:  2 FINDINGS: A right hip replacement is seen. No acute bony or soft tissue abnormality is noted. IMPRESSION: Status post right hip  replacement Electronically Signed   By: Inez Catalina M.D.   On: 06/24/2016 13:18   Dg Hip Unilat W Or W/o Pelvis 2-3 Views Right  Result Date: 06/24/2016 CLINICAL DATA:  ORIF for right femoral neck fracture. EXAM: DG HIP (WITH OR WITHOUT PELVIS) 2-3V RIGHT COMPARISON:  06/21/2016. FINDINGS: Portable AP and cross-table lateral view of the right hip shows the patient be status post hip replacement. No evidence for immediate hardware complications. Skin staples are seen over the lateral hip. IMPRESSION: Status post right hip replacement for femoral neck fracture. No evidence for immediate hardware complications. Electronically Signed   By: Misty Stanley M.D.   On: 06/24/2016 13:55    Scheduled Meds: . acetaminophen  1,000 mg Oral Q6H  . aspirin EC  81 mg Oral Daily  . docusate sodium  100 mg Oral BID  . [START ON 06/26/2016] enoxaparin (LOVENOX) injection  30 mg Subcutaneous Q24H  . feeding supplement (ENSURE ENLIVE)  1 Bottle Oral TID BM  . FLUoxetine  10 mg Oral Daily  . megestrol  40 mg Oral BID  . nystatin  5 mL Oral QID  . sodium chloride flush  3 mL Intravenous Q12H   Continuous Infusions: . sodium chloride 65 mL/hr at 06/25/16 0857  . sodium chloride 50 mL/hr at 06/24/16 2235    Assessment/Plan:  1. Apical ballooning NSTEMI- cardiac cath negative. Patient has cardiomyopathy on echo.  With blood pressure on the lower side I will have to hold metoprolol. Unable to give ACE inhibitor at this point. Continue gentle IV fluids. 2. Right hip fracture requiring operative repair. Postoperatively day one looks good. Down to 2 L of oxygen. 3. Hx colon cancer, Hx larygeal cancer 4. Depression- fluoxetine 5. Hypomagnesemia. Replace again today. 6. Hypokalemia replaced 7. Constipation. Colace, MOM. When necessary lactulose if needed. 8. Slight bump in creatinine and relative hypotension. Hold Toprol and continue IV fluids today.  Code Status:     Code Status Orders        Start      Ordered   06/21/16 2030  Do not attempt resuscitation (DNR)  Continuous    Question Answer Comment  In the event of cardiac or respiratory ARREST Do not call a "code blue"   In the event of cardiac or respiratory ARREST Do not perform Intubation, CPR, defibrillation or ACLS   In the event of cardiac or respiratory ARREST Use medication by any route, position, wound care, and other measures to relive pain and suffering. May use oxygen, suction and manual treatment of airway obstruction as needed for comfort.      06/21/16 2029    Code Status History    Date Active Date Inactive Code Status Order ID Comments User Context   06/21/2016  8:29 PM 06/22/2016  9:00 AM DNR [id number]  Vaughan Basta, MD Inpatient    Questions for Most Recent Historical Code Status (Order [id number])    Question Answer Comment   In the event of cardiac or respiratory ARREST Do not call a "code blue"    In the event of cardiac or respiratory ARREST Do not perform Intubation, CPR, defibrillation or ACLS    In the event of cardiac or respiratory ARREST Use medication by any route, position, wound care, and other measures to relive pain and suffering. May use oxygen, suction and manual treatment of airway obstruction as needed for comfort.         Advance Directive Documentation   Flowsheet Row Most Recent Value  Type of Advance Directive  Healthcare Power of [place], [place] Directive  Pre-existing out of facility DNR order (yellow form or pink MOST form)  No data  "MOST" Form in Place?  No data     Family Communication: Spoke with family yesterday prior to surgery. Disposition Plan: Will need rehabilitation after hip surgery (likely Saturday if all goes well postoperatively)  Consultants:  Cardiology  Orthopedic surgery  Time spent: 24 minutes  [place], [place]

## 2016-06-25 NOTE — Evaluation (Signed)
Occupational Therapy Evaluation Patient Details Name: Emily [name] MRN: [id number] DOB: [birth year] Today's Date: 06/25/2016    History of Present Illness Pt is a 82 y.o. female who lives at home alone and fell and was not found for 2 days until her son went by the home. Pt was found confused at that time. She was found to be dehydrated, and her troponin was very high, right hip fracture found on x-ray. She underwent R THA (anterior) by Dr Rudene Christians on 06-24-16.  She is currently on 2L of nasal cannula.  Currently, pt is alert/awake more oriented; she is able to hold a basic conversation and follow general instruction.  She has a known history of arthritis, throat cancer status post feeding tube, radiation/chemotherapy, colon cancer, hypertension, osteopenia. She has had some episodes of forgetfulness for last few months. She has decreased overall eating and she is losing weight up until now. Son had visit to oncologist and primary care doctor for that, she was also started on Megace and Prozac, but not much help.    Clinical Impression   Pt is 82 year old female s/p R THA(anterior)  who lives at home alone with son and his wife close by and check in on her.  Pt was independent in all ADLs prior to surgery and wants to return to PLOF. She is currently on 2L nasal cannula. Pt is currently limited in functional ADLs due to pain, decreased ROM, and confusion.  Pt requires maximum assist for LB dressing and bathing skills due to pain and decreased AROM of R LE  and would benefit from continued skilled OT services for education in assistive devices, functional mobility, and education in recommendations for home modifications to increase safety and prevent falls.  Pt is a good candidate for SNF to continue rehabilitation.      Follow Up Recommendations  SNF    Equipment Recommendations       Recommendations for Other Services       Precautions / Restrictions Precautions Precautions: Anterior  Hip;Fall Precaution Comments: Pt is DNR Restrictions Weight Bearing Restrictions: No Other Position/Activity Restrictions: on nasal cannula 2L      Mobility Bed Mobility                  Transfers                      Balance                                            ADL Overall ADL's : Needs assistance/impaired Eating/Feeding: Set up;Minimal assistance Eating/Feeding Details (indicate cue type and reason): appetite fluctuates with hx of throat cancer and peg tube in place but pt has been eating Grooming: Wash/dry hands;Wash/dry face;Oral care;Applying deodorant;Brushing hair;Set up;Cueing for sequencing           Upper Body Dressing : Independent;Set up   Lower Body Dressing: Moderate assistance;Set up;Cueing for sequencing;Cueing for safety;With adaptive equipment Lower Body Dressing Details (indicate cue type and reason): Attetmpted using AD sitting EOB but patient with difficulty understanding instructions and cues to maintain focus on task.                 General ADL Comments: Pt presents with delayed response to questions and was not sure if she had grab bars in shower or by toilet but able  to answer other questions about home.  Affect is flat.  Full AROM of BUEs and hands but weak.      Vision     Perception     Praxis      Pertinent Vitals/Pain Pain Assessment: No/denies pain     Hand Dominance Right   Extremity/Trunk Assessment Upper Extremity Assessment Upper Extremity Assessment: Generalized weakness   Lower Extremity Assessment Lower Extremity Assessment: Defer to PT evaluation       Communication Communication Communication: No difficulties (delayed responses at times )   Cognition Arousal/Alertness: Awake/alert Behavior During Therapy: WFL for tasks assessed/performed (periods of confusion and some anxiety at times but not entire time) Overall Cognitive Status: No family/caregiver present to  determine baseline cognitive functioning       Memory: Decreased short-term memory             General Comments       Exercises       Shoulder Instructions      Home Living Family/patient expects to be discharged to:: Unsure Living Arrangements: Alone                                      Prior Functioning/Environment Level of Independence: Independent        Comments: Pt reports she was independent and living at home alone and occasionally used FWW and rarely used SPC.  Her son and his wife live close by and check on her.  Not sure of patient's cognitive ability to answer questions since she is not sure of several answers during eval but is oriented to person, place and situation but not date.    OT Diagnosis: Generalized weakness;Acute pain (pain when moving)   OT Problem List: Decreased strength;Decreased range of motion;Decreased activity tolerance;Decreased knowledge of use of DME or AE;Decreased knowledge of precautions;Pain   OT Treatment/Interventions: Self-care/ADL training;Therapeutic activities;Patient/family education;DME and/or AE instruction    OT Goals(Current goals can be found in the care plan section) Acute Rehab OT Goals Patient Stated Goal: "to get stronger again" OT Goal Formulation: With patient Time For Goal Achievement: 07/09/16 Potential to Achieve Goals: Good ADL Goals Pt Will Perform Lower Body Dressing: with min assist;sit to/from stand;with adaptive equipment (using FWW for standing and no LOB) Pt Will Transfer to Toilet: stand pivot transfer;with min assist;bedside commode (BSC over toilet and FWW)  OT Frequency: Min 1X/week   Barriers to D/C:    lives at home alone       Co-evaluation              End of Session Nurse Communication: Mobility status (clearance for therapy since troponin was elevated)  Activity Tolerance: Patient limited by fatigue Patient left: in bed;with call bell/phone within reach;with  bed alarm set   Time: [id number] OT Time Calculation (min): 30 min Charges:  OT General Charges $OT Visit: 1 Procedure OT Evaluation $OT Eval Moderate Complexity: 1 Procedure OT Treatments $Self Care/Home Management : 8-22 mins G-Codes:     Chrys Racer, OTR/L ascom [phone] 06/25/16, 12:04 PM

## 2016-06-25 NOTE — Progress Notes (Signed)
[facility] Encounter Note  Patient: Emily [name] / Admit Date: 06/21/2016 / Date of Encounter: 06/25/2016, 8:37 AM   Subjective: Patient has no evidence of chest discomfort. No rhythm disturbances or evidence of congestive heart failure. 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 of the apex with ejection fraction of 25-30% Cardiac catheterization shows continued apical ballooning syndrome with ejection fraction of 25-30% and normal coronary arteries with 0% stenosis  Review of Systems: Positive for: Leg pain Negative for: Vision change, hearing change, syncope, dizziness, nausea, vomiting,diarrhea, bloody stool, stomach pain, cough, congestion, diaphoresis, urinary frequency, urinary pain,skin lesions, skin rashes Others previously listed  Objective: Telemetry: Normal sinus rhythm Physical Exam: Blood pressure (!) 102/48, pulse 66, temperature (!) 95.5 F (35.3 C), resp. rate 16, height 5\' 5"  (1.651 m), weight 108 lb 9.6 oz (49.3 kg), SpO2 100 %. Body mass index is 18.07 kg/m. General: Well developed, well nourished, in no acute distress. Head: Normocephalic, atraumatic, sclera non-icteric, no xanthomas, nares are without discharge. Neck: No apparent masses Lungs: Normal respirations with no wheezes, no rhonchi, no rales , no crackles   Heart: Regular rate and rhythm, normal S1 S2, no murmur, no rub, no gallop, PMI is normal size and placement, carotid upstroke normal without bruit, jugular venous pressure normal Abdomen: Soft, non-tender, non-distended with normoactive bowel sounds. No hepatosplenomegaly. Abdominal aorta is normal size without bruit Extremities: No edema, no clubbing, no cyanosis, no ulcers,  Peripheral: 2+ radial, 2+ femoral, 2+ dorsal pedal pulses Neuro: Alert and oriented. Moves all extremities spontaneously. Psych:  Responds to questions appropriately with a normal affect.   Intake/Output  Summary (Last 24 hours) at 06/25/16 0837 Last data filed at 06/25/16 0530  Gross per 24 hour  Intake          2655.16 ml  Output             1050 ml  Net          1605.16 ml    Inpatient Medications:  . acetaminophen  1,000 mg Oral Q6H  . aspirin EC  81 mg Oral Daily  .  ceFAZolin (ANCEF) IV  1 g Intravenous Q6H  . docusate sodium  100 mg Oral BID  . enoxaparin (LOVENOX) injection  40 mg Subcutaneous Q24H  . feeding supplement (ENSURE ENLIVE)  1 Bottle Oral TID BM  . FLUoxetine  10 mg Oral Daily  . megestrol  40 mg Oral BID  . metoprolol succinate  25 mg Oral Daily  . nystatin  5 mL Oral QID  . sodium chloride flush  3 mL Intravenous Q12H   Infusions:  . sodium chloride 40 mL/hr at 06/23/16 2147  . sodium chloride 50 mL/hr at 06/24/16 2235    Labs:  Recent Labs  06/24/16 0516 06/25/16 0409  NA 139 138  K 3.6 3.7  CL 115* 111  CO2 19* 17*  GLUCOSE 101* 144*  BUN 22* 28*  CREATININE 0.94 1.32*  CALCIUM 8.0* 8.1*  MG 1.9 1.8   No results for input(s): AST, ALT, ALKPHOS, BILITOT, PROT, ALBUMIN in the last 72 hours.  Recent Labs  06/23/16 0451 06/24/16 0746  WBC 5.2 6.2  HGB 11.0* 11.5*  HCT 32.2* 33.9*  MCV 93.3 94.3  PLT 121* 117*   No results for input(s): CKTOTAL, CKMB, TROPONINI in the last 72 hours. Invalid input(s): POCBNP No results for input(s): HGBA1C in the last 72 hours.   Weights: Filed Weights   06/21/16 1559  06/23/16 0505  Weight: 102 lb 4.8 oz (46.4 kg) 108 lb 9.6 oz (49.3 kg)     Radiology/Studies:  Dg Chest 1 View  Result Date: 06/21/2016 CLINICAL DATA:  Found down today. Right hip pain and dry mouth. Throat cancer. EXAM: CHEST 1 VIEW COMPARISON:  PET CT 05/14/2016.  Radiographs 01/01/2016. FINDINGS: 1702 hours. The heart size and mediastinal contours are stable. There is aortic atherosclerosis. Right subclavian Port-A-Cath tip extends to the SVC right atrial level. The lungs are clear. There is no pleural effusion or pneumothorax. No  evidence of residual pneumoperitoneum. Stable old rib fractures or thoracotomy defects bilaterally. Percutaneous G-tube noted. IMPRESSION: No active cardiopulmonary process. Electronically Signed   By: Richardean Sale M.D.   On: 06/21/2016 17:24   Ct Head Wo Contrast  Result Date: 06/21/2016 CLINICAL DATA:  82 year old female found down. Pain and dehydration. Recently completed treatment for throat cancer. Initial encounter. EXAM: CT HEAD WITHOUT CONTRAST CT CERVICAL SPINE WITHOUT CONTRAST TECHNIQUE: Multidetector CT imaging of the head and cervical spine was performed following the standard protocol without intravenous contrast. Multiplanar CT image reconstructions of the cervical spine were also generated. COMPARISON:  PET-CT 05/14/2016 and earlier. FINDINGS: CT HEAD FINDINGS Small volume retained secretions in the nasopharynx. Visualized paranasal sinuses and mastoids are well pneumatized. Calvarium intact. Osteopenia. No acute orbits soft tissue finding. No scalp hematoma identified. Extensive Calcified atherosclerosis at the skull base. Cerebral volume is within normal limits for age. Mild for age patchy bilateral nonspecific white matter hypodensity. No midline shift, ventriculomegaly, mass effect, evidence of mass lesion, intracranial hemorrhage or evidence of cortically based acute infarction. No suspicious intracranial vascular hyperdensity. CT CERVICAL SPINE FINDINGS Preserved cervical lordosis. Visualized skull base is intact. No atlanto-occipital dissociation. Cervicothoracic junction alignment is within normal limits. Bilateral posterior element alignment is within normal limits. Mild degenerative appearing spondylolisthesis at C5-C6 where there is chronic severe disc space loss and endplate spurring. No cervical spine fracture identified. Multilevel mild degenerative cervical spinal stenosis suspected. Visible upper thoracic levels appear intact. Negative lung apices aside from mild scarring.  Calcified carotid atherosclerosis in the neck. Small volume retained secretions in the hypopharynx. Otherwise negative noncontrast neck soft tissues. IMPRESSION: 1. No acute intracranial abnormality. Mild for age cerebral white matter changes most commonly due to chronic small vessel disease. 2. No acute fracture or listhesis identified in the cervical spine. Ligamentous injury is not excluded. 3. Mild retained secretions in the pharynx. Electronically Signed   By: Genevie Ann M.D.   On: 06/21/2016 17:39   Ct Cervical Spine Wo Contrast  Result Date: 06/21/2016 CLINICAL DATA:  82 year old female found down. Pain and dehydration. Recently completed treatment for throat cancer. Initial encounter. EXAM: CT HEAD WITHOUT CONTRAST CT CERVICAL SPINE WITHOUT CONTRAST TECHNIQUE: Multidetector CT imaging of the head and cervical spine was performed following the standard protocol without intravenous contrast. Multiplanar CT image reconstructions of the cervical spine were also generated. COMPARISON:  PET-CT 05/14/2016 and earlier. FINDINGS: CT HEAD FINDINGS Small volume retained secretions in the nasopharynx. Visualized paranasal sinuses and mastoids are well pneumatized. Calvarium intact. Osteopenia. No acute orbits soft tissue finding. No scalp hematoma identified. Extensive Calcified atherosclerosis at the skull base. Cerebral volume is within normal limits for age. Mild for age patchy bilateral nonspecific white matter hypodensity. No midline shift, ventriculomegaly, mass effect, evidence of mass lesion, intracranial hemorrhage or evidence of cortically based acute infarction. No suspicious intracranial vascular hyperdensity. CT CERVICAL SPINE FINDINGS Preserved cervical lordosis. Visualized skull base is intact.  No atlanto-occipital dissociation. Cervicothoracic junction alignment is within normal limits. Bilateral posterior element alignment is within normal limits. Mild degenerative appearing spondylolisthesis at C5-C6  where there is chronic severe disc space loss and endplate spurring. No cervical spine fracture identified. Multilevel mild degenerative cervical spinal stenosis suspected. Visible upper thoracic levels appear intact. Negative lung apices aside from mild scarring. Calcified carotid atherosclerosis in the neck. Small volume retained secretions in the hypopharynx. Otherwise negative noncontrast neck soft tissues. IMPRESSION: 1. No acute intracranial abnormality. Mild for age cerebral white matter changes most commonly due to chronic small vessel disease. 2. No acute fracture or listhesis identified in the cervical spine. Ligamentous injury is not excluded. 3. Mild retained secretions in the pharynx. Electronically Signed   By: Genevie Ann M.D.   On: 06/21/2016 17:39   Dg Hip Operative Unilat W Or W/o Pelvis Right  Result Date: 06/24/2016 CLINICAL DATA:  Right hip replacement EXAM: OPERATIVE RIGHT HIP WITH PELVIS COMPARISON:  None. FLUOROSCOPY TIME:  Radiation Exposure Index (as provided by the fluoroscopic device): 2 mGy If the device does not provide the exposure index: Fluoroscopy Time:  12 seconds Number of Acquired Images:  2 FINDINGS: A right hip replacement is seen. No acute bony or soft tissue abnormality is noted. IMPRESSION: Status post right hip replacement Electronically Signed   By: Inez Catalina M.D.   On: 06/24/2016 13:18   Dg Hip Unilat W Or W/o Pelvis 2-3 Views Right  Result Date: 06/24/2016 CLINICAL DATA:  ORIF for right femoral neck fracture. EXAM: DG HIP (WITH OR WITHOUT PELVIS) 2-3V RIGHT COMPARISON:  06/21/2016. FINDINGS: Portable AP and cross-table lateral view of the right hip shows the patient be status post hip replacement. No evidence for immediate hardware complications. Skin staples are seen over the lateral hip. IMPRESSION: Status post right hip replacement for femoral neck fracture. No evidence for immediate hardware complications. Electronically Signed   By: Misty Stanley M.D.   On:  06/24/2016 13:55   Dg Hip Unilat W Or Wo Pelvis 2-3 Views Right  Result Date: 06/21/2016 CLINICAL DATA:  Pain after fall EXAM: DG HIP (WITH OR WITHOUT PELVIS) 2-3V RIGHT COMPARISON:  None. FINDINGS: There is a fracture through the right femoral neck. Laterally and superiorly, the fracture extends into the subcapital region. No dislocation identified. No other acute abnormalities. IMPRESSION: Fracture through the right femoral neck extending into the subcapital region superiorly and laterally. No dislocation. Electronically Signed   By: Dorise Bullion III M.D   On: 06/21/2016 17:18     Assessment and Recommendation  82 y.o. female with no history of cardiovascular disease who is had a fall with a fracture and unable to get up and in distress for one to 2 days likely having apical ballooning syndrome due to the stress with non-ST elevation myocardial infarction with a cardiac catheterization showing normal coronary arteries 1. No need for plavix at this time due to concerns of surgery bleedingAnd will watch closely as recovering from surgery 2. No further cardiac intervention at this time due to normal coronary arteries 3. 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 Proceed to orthopedic surgery recovery without restriction 5. Diligent assessment and treatment of possible fluid loading with rehabilitation and concerns of heart failure 6. Call if further questions at this time but follow-up from the cardiac standpoint in 2-4 weeks  Signed, Bruce  Nehemiah Massed M.D. FACC

## 2016-06-25 NOTE — Progress Notes (Signed)
Order for enoxaparin 40 mg daily changed to 30 mg per anticoagulation protocol for CrCl < 30 mL/min.  Darylene Price [place] Dames 10:12 AM

## 2016-06-25 NOTE — Clinical Social Work Placement (Signed)
   CLINICAL SOCIAL WORK PLACEMENT  NOTE  Date:  06/25/2016  Patient Details  Name: Emily [name] MRN: [id number] Date of Birth: [birth year]  Clinical Social Work is seeking post-discharge placement for this patient at the [place] level of care (*CSW will initial, date and re-position this form in  chart as items are completed):  Yes   Patient/family provided with [place] Work Department's list of facilities offering this level of care within the geographic area requested by the patient (or if unable, by the patient's family).  Yes   Patient/family informed of their freedom to choose among providers that offer the needed level of care, that participate in Medicare, Medicaid or managed care program needed by the patient, have an available bed and are willing to accept the patient.  Yes   Patient/family informed of [facility]'s ownership interest in [facility] and [facility], as well as of the fact that they are under no obligation to receive care at these facilities.  PASRR submitted to EDS on 06/24/16     PASRR number received on 06/24/16     Existing PASRR number confirmed on       FL2 transmitted to all facilities in geographic area requested by pt/family on 06/25/16     FL2 transmitted to all facilities within larger geographic area on       Patient informed that his/her managed care company has contracts with or will negotiate with certain facilities, including the following:            Patient/family informed of bed offers received.  Patient chooses bed at       Physician recommends and patient chooses bed at      Patient to be transferred to   on  .  Patient to be transferred to facility by       Patient family notified on   of transfer.  Name of family member notified:        PHYSICIAN       Additional Comment:    _______________________________________________ Cedar Roseman, Veronia Beets, LCSW 06/25/2016, 1:51 PM

## 2016-06-25 NOTE — Clinical Social Work Note (Signed)
Clinical Social Work Assessment  Patient Details  Name: Emily [name] MRN: [id number] Date of Birth: February 22, 1933  Date of referral:  06/25/16               Reason for consult:  Facility Placement                Permission sought to share information with:  Chartered certified accountant granted to share information::  Yes, Verbal Permission Granted  Name::      [name]::   [name]   Relationship::     Contact Information:     Housing/Transportation Living arrangements for the past 2 months:  [place] of Information:  Patient, Adult Children Patient Interpreter Needed:  None Criminal Activity/Legal Involvement Pertinent to Current Situation/Hospitalization:  No - Comment as needed Significant Relationships:  Adult Children Lives with:  Self Do you feel safe going back to the place where you live?  Yes Need for family participation in patient care:  Yes (Comment)  Care giving concerns:  Patient lives alone in [place].    Social Worker assessment / plan:  Holiday representative (CSW) received SNF consult. PT is recommending SNF. CSW met with patient alone at bedside. Patient was alert and oriented and was sitting up in the chair. CSW introduced self and explained role of CSW department. Patient reported that she lives in [place] and her son Emily [name] is her primary support. CSW discussed SNF placement and made her aware that Medicare requires a 3 night inpatient qualifying stay in order to pay for SNF. Patient is agreeable to SNF and asked CSW to call her son to discuss options. SNF list was provided. CSW contacted patient's son Emily [name] and made him aware of above. Son is agreeable to SNF and prefers [facility] or [facility].   FL2 complete and faxed out. CSW will continue to follow and assist as needed.    Employment status:  Retired Forensic scientist:  Medicare PT Recommendations:  [place] /  Referral to community resources:  [place]  Patient/Family's Response to care:  Patient and her son are agreeable to [facility].   Patient/Family's Understanding of and Emotional Response to Diagnosis, Current Treatment, and Prognosis:  Patient was pleasant and thanked CSW for visit.   Emotional Assessment Appearance:  Appears stated age Attitude/Demeanor/Rapport:  Lethargic Affect (typically observed):  Accepting, Adaptable, Pleasant Orientation:  Oriented to Self, Oriented to Place, Oriented to  Time, Fluctuating Orientation (Suspected and/or reported Sundowners) Alcohol / Substance use:  Not Applicable Psych involvement (Current and /or in the community):  No (Comment)  Discharge Needs  Concerns to be addressed:  Discharge Planning Concerns Readmission within the last 30 days:  No Current discharge risk:  Dependent with Mobility Barriers to Discharge:  Continued Medical Work up   [employer], Veronia Beets, LCSW 06/25/2016, 1:52 PM

## 2016-06-25 NOTE — Progress Notes (Signed)
Physical Therapy Treatment Patient Details Name: Emily [name] MRN: [id number] DOB: 06-Jun-1933 Today's Date: 06/25/2016    History of Present Illness Pt is a 82 y.o. female who lives at home alone and fell and was not found for 2 days until her son went by the home. Pt was found confused at that time. She was found to be dehydrated, and her troponin was very high, right hip fracture found on x-ray. She underwent R THA (anterior) by Dr Rudene Christians on 06-24-16.     PT Comments    Pt did poorly with PT session this afternoon.  She struggled to follow any instructions, was very unsteady and unsafe during mobility and "ambulation" and ultimately was very limited with exercises.  Pt on room air with stats in the high 90s but she seemed lethargic and at times confused with very low energy.  Nursing aware.   Follow Up Recommendations  SNF     Equipment Recommendations       Recommendations for Other Services       Precautions / Restrictions Precautions Precautions: Anterior Hip;Fall Restrictions Weight Bearing Restrictions: Yes RLE Weight Bearing: Weight bearing as tolerated    Mobility  Bed Mobility Overal bed mobility: Needs Assistance Bed Mobility: Sit to Supine     Supine to sit: Min assist;Mod assist Sit to supine: Max assist   General bed mobility comments: Pt did poorly with mobility, showed very low energy and was unable to follow instructions well  Transfers Overall transfer level: Needs assistance Equipment used: Rolling walker (2 wheeled) Transfers: Sit to/from Stand Sit to Stand: Mod assist;Max assist         General transfer comment: Pt does poorly with sit to stand and needs max assist to get back onto bed safely as she was too tired to even stay upright enough to back up to bed despite heavy cuing and increasing assist.  Ambulation/Gait Ambulation/Gait assistance: Max assist Ambulation Distance (Feet): 3 Feet Assistive device: Rolling walker (2 wheeled)        General Gait Details: Pt essentially unable to do any real ambulation.  She was lethargic, weak and generally was very limited with what she was able to do.  Pt hardly able to shuffle feet much less take steps and needs max assist just to remain upright while trying to do any movement   Stairs            Wheelchair Mobility    Modified Rankin (Stroke Patients Only)       Balance                                    Cognition Arousal/Alertness: Lethargic Behavior During Therapy: Flat affect Overall Cognitive Status: Difficult to assess                      Exercises Total Joint Exercises Ankle Circles/Pumps: 10 reps;AAROM Quad Sets: Strengthening;10 reps Gluteal Sets: 10 reps;Strengthening Heel Slides: 10 reps;AAROM Hip ABduction/ADduction: AAROM;10 reps    General Comments        Pertinent Vitals/Pain Pain Assessment:  (not rated, shows poor overall awareness) Pain Score: 3  Pain Location: R hip    Home Living Family/patient expects to be discharged to:: Skilled nursing facility Living Arrangements: Alone                  Prior Function Level of Independence: Independent  Comments: Pt reports she was independent and living at home alone and occasionally used FWW and rarely used SPC.  Her son and his wife live close by and check on her.  Not sure of patient's cognitive ability to answer questions since she is not sure of several answers during eval but is oriented to person, place and situation but not date.   PT Goals (current goals can now be found in the care plan section) Acute Rehab PT Goals Patient Stated Goal: "to get stronger again" PT Goal Formulation: With patient Time For Goal Achievement: 07/09/16 Potential to Achieve Goals: Fair    Frequency  BID    PT Plan Current plan remains appropriate    Co-evaluation             End of Session Equipment Utilized During Treatment: Gait belt Activity  Tolerance: Patient limited by fatigue Patient left: with bed alarm set;with call bell/phone within reach     Time: 1420-1446 PT Time Calculation (min) (ACUTE ONLY): 26 min  Charges:  $Gait Training: 8-22 mins $Therapeutic Exercise: 8-22 mins                    G Codes:      Kreg Shropshire, DPT 06/25/2016, 3:47 PM

## 2016-06-25 NOTE — Care Management Note (Signed)
Case Management Note  Patient Details  Name: Emily [name] MRN: [id number] Date of Birth: [birth year]  Subjective/Objective:                    Action/Plan: CSW following patient for discharge to SNF   Expected Discharge Date:                  Expected Discharge Plan:  [place]  In-House Referral:     Discharge planning Services  CM Consult  Post Acute Care Choice:  NA Choice offered to:     DME Arranged:    DME Agency:     HH Arranged:    [place] Agency:     Status of Service:  Completed, signed off  If discussed at [employer], dates discussed:    Additional Comments:  Alvie Heidelberg, RN 06/25/2016, 12:18 PM

## 2016-06-25 NOTE — Progress Notes (Signed)
Pt alert answering questions appropriately forgetful at times. She ate well for breakfast and a fair amount for lunch. Up with PT and nursing. BM today tylenol for pain.

## 2016-06-25 NOTE — Progress Notes (Signed)
Foley d/c''d at 0530 with 300cc urine output

## 2016-06-25 NOTE — Anesthesia Postprocedure Evaluation (Signed)
Anesthesia Post Note  Patient: Emily [name]  Procedure(s) Performed: Procedure(s) (LRB): TOTAL HIP ARTHROPLASTY ANTERIOR APPROACH (Right)  Patient location during evaluation: PACU Anesthesia Type: General Level of consciousness: awake and alert Pain management: pain level controlled Vital Signs Assessment: post-procedure vital signs reviewed and stable Respiratory status: spontaneous breathing, nonlabored ventilation and respiratory function stable Cardiovascular status: blood pressure returned to baseline and stable Postop Assessment: no signs of nausea or vomiting Anesthetic complications: no    Last Vitals:  Vitals:   06/25/16 0346 06/25/16 0749  BP: (!) 108/40 (!) 102/48  Pulse: (!) 101 66  Resp: 17 16  Temp: 37 C (!) 35.3 C    Last Pain:  Vitals:   06/25/16 0345  TempSrc:   PainSc: Asleep                 Natalyia Innes

## 2016-06-25 NOTE — Evaluation (Signed)
Physical Therapy Evaluation Patient Details Name: Emily [name] MRN: [id number] DOB: [birth year] Today's Date: 06/25/2016   History of Present Illness  Pt is a 82 y.o. female who lives at home alone and fell and was not found for 2 days until her son went by the home. Pt was found confused at that time. She was found to be dehydrated, and her troponin was very high, right hip fracture found on x-ray. She underwent R THA (anterior) by Dr Rudene Christians on 06-24-16. She has had some episodes of forgetfulness for last few months.  Clinical Impression  Pt shows good effort t/o the session, she is generally weak and has some confusion but ultimately did well with PT exam.  She was able to take a few small steps and needed only light assist with transfers/mobility. Pt reports little pain at rest but does have increased pain with exercises and appeared to have considerable increased with WBing despite ability to do some minimal ambulation.     Follow Up Recommendations SNF    Equipment Recommendations       Recommendations for Other Services       Precautions / Restrictions Precautions Precautions: Anterior Hip;Fall Precaution Comments: Pt is DNR Restrictions Weight Bearing Restrictions: Yes RLE Weight Bearing: Weight bearing as tolerated Other Position/Activity Restrictions: on nasal cannula 2L      Mobility  Bed Mobility Overal bed mobility: Needs Assistance Bed Mobility: Supine to Sit     Supine to sit: Min assist;Mod assist     General bed mobility comments: Pt shows good effort with getting to EOB, generally needs only light assist with LEs minmally in trunk  Transfers Overall transfer level: Needs assistance Equipment used: Rolling walker (2 wheeled) Transfers: Sit to/from Stand Sit to Stand: Min assist         General transfer comment: Pt shows good effort getting to standing, needs assist with hand placement and set up, did not need excessive  assist  Ambulation/Gait Ambulation/Gait assistance: Min assist Ambulation Distance (Feet): 5 Feet Assistive device: Rolling walker (2 wheeled)       General Gait Details: Pt with slow, cautious gait, but ultiamtely able to take a few small steps and though she has some considerable fatigue did not have any LOBs or over safety issues  Stairs            Wheelchair Mobility    Modified Rankin (Stroke Patients Only)       Balance                                             Pertinent Vitals/Pain Pain Assessment: 0-10 Pain Score: 3  Pain Location: R hip    Home Living Family/patient expects to be discharged to:: Skilled nursing facility Living Arrangements: Alone                    Prior Function Level of Independence: Independent         Comments: Pt reports she was independent and living at home alone and occasionally used FWW and rarely used SPC.  Her son and his wife live close by and check on her.  Not sure of patient's cognitive ability to answer questions since she is not sure of several answers during eval but is oriented to person, place and situation but not date.     Hand Dominance  Dominant Hand: Right    Extremity/Trunk Assessment   Upper Extremity Assessment: Generalized weakness           Lower Extremity Assessment: RLE deficits/detail (L LE grossly 4-/5) RLE Deficits / Details: R LE grossly 3/5, unable to do SLRs       Communication   Communication: No difficulties (pt appeared to have some confused/delayed responses)  Cognition Arousal/Alertness: Awake/alert Behavior During Therapy: WFL for tasks assessed/performed Overall Cognitive Status: No family/caregiver present to determine baseline cognitive functioning       Memory: Decreased short-term memory              General Comments      Exercises Total Joint Exercises Ankle Circles/Pumps: Strengthening;10 reps Quad Sets: Strengthening;10  reps Gluteal Sets: Strengthening;10 reps Heel Slides: AROM;5 reps Hip ABduction/ADduction: Strengthening;AROM;10 reps      Assessment/Plan    PT Assessment Patient needs continued PT services  PT Diagnosis Difficulty walking;Generalized weakness   PT Problem List Decreased strength;Decreased range of motion;Decreased activity tolerance;Decreased mobility;Decreased balance;Decreased cognition;Decreased knowledge of use of DME;Decreased safety awareness  PT Treatment Interventions DME instruction;Gait training;Functional mobility training;Therapeutic activities;Therapeutic exercise;Balance training;Patient/family education   PT Goals (Current goals can be found in the Care Plan section) Acute Rehab PT Goals Patient Stated Goal: "to get stronger again" PT Goal Formulation: With patient Time For Goal Achievement: 07/09/16 Potential to Achieve Goals: Fair    Frequency BID   Barriers to discharge        Co-evaluation               End of Session Equipment Utilized During Treatment: Gait belt Activity Tolerance: Patient tolerated treatment well Patient left: with chair alarm set;with call bell/phone within reach           Time: 1135-1210 PT Time Calculation (min) (ACUTE ONLY): 35 min   Charges:   PT Evaluation $PT Eval Low Complexity: 1 Procedure PT Treatments $Therapeutic Exercise: 8-22 mins   PT G Codes:        Kreg Shropshire, DPT 06/25/2016, 2:15 PM

## 2016-06-26 ENCOUNTER — Encounter
Admission: RE | Admit: 2016-06-26 | Discharge: 2016-06-26 | Disposition: A | Discharge status: Home / Self Care | Payer: Medicare Other | Source: Ambulatory Visit | Attending: Internal Medicine | Admitting: Internal Medicine

## 2016-06-26 LAB — BASIC METABOLIC PANEL
ANION GAP: 5 (ref 5–15)
BUN: 31 mg/dL — ABNORMAL HIGH (ref 6–20)
CALCIUM: 7.6 mg/dL — AB (ref 8.9–10.3)
CO2: 19 mmol/L — AB (ref 22–32)
Chloride: 114 mmol/L — ABNORMAL HIGH (ref 101–111)
Creatinine, Ser: 0.99 mg/dL (ref 0.44–1.00)
GFR calc non Af Amer: 52 mL/min — ABNORMAL LOW (ref >60)
GLUCOSE: 108 mg/dL — AB (ref 65–99)
POTASSIUM: 2.9 mmol/L — AB (ref 3.5–5.1)
Sodium: 138 mmol/L (ref 135–145)

## 2016-06-26 LAB — CBC
HEMATOCRIT: 19.2 % — AB (ref 35.0–47.0)
HEMATOCRIT: 31 % — AB (ref 35.0–47.0)
HEMOGLOBIN: 6.7 g/dL — AB (ref 12.0–16.0)
HEMOGLOBIN: 10.7 g/dL — AB (ref 12.0–16.0)
MCH: 32.1 pg (ref 26.0–34.0)
MCH: 32.1 pg (ref 26.0–34.0)
MCHC: 34.6 g/dL (ref 32.0–36.0)
MCHC: 34.6 g/dL (ref 32.0–36.0)
MCV: 92.7 fL (ref 80.0–100.0)
MCV: 92.8 fL (ref 80.0–100.0)
Platelets: 113 10*3/uL — ABNORMAL LOW (ref 150–440)
Platelets: 136 10*3/uL — ABNORMAL LOW (ref 150–440)
RBC: 2.08 MIL/uL — AB (ref 3.80–5.20)
RBC: 3.34 MIL/uL — AB (ref 3.80–5.20)
RDW: 14.4 % (ref 11.5–14.5)
RDW: 14.3 % (ref 11.5–14.5)
WBC: 6 10*3/uL (ref 3.6–11.0)
WBC: 8.3 10*3/uL (ref 3.6–11.0)

## 2016-06-26 LAB — SURGICAL PATHOLOGY

## 2016-06-26 LAB — MAGNESIUM: MAGNESIUM: 1.6 mg/dL — AB (ref 1.7–2.4)

## 2016-06-26 LAB — PREPARE RBC (CROSSMATCH)

## 2016-06-26 LAB — ABO/RH: ABO/RH(D): B POS

## 2016-06-26 MED ORDER — FERROUS SULFATE 325 (65 FE) MG PO TABS
325.0000 mg | ORAL_TABLET | Freq: Two times a day (BID) | ORAL | Status: DC
  Administered 2016-06-26 – 2016-06-27 (×2): 325 mg via ORAL
  Filled 2016-06-26 (×2): qty 1

## 2016-06-26 MED ORDER — ENOXAPARIN SODIUM 40 MG/0.4ML ~~LOC~~ SOLN
40.0000 mg | SUBCUTANEOUS | Status: DC
  Administered 2016-06-27: 40 mg via SUBCUTANEOUS
  Filled 2016-06-26: qty 0.4

## 2016-06-26 MED ORDER — SODIUM CHLORIDE 0.9 % IV SOLN
Freq: Once | INTRAVENOUS | Status: AC
  Administered 2016-06-26: 10:00:00 via INTRAVENOUS

## 2016-06-26 MED ORDER — POTASSIUM CHLORIDE 10 MEQ/100ML IV SOLN
10.0000 meq | INTRAVENOUS | Status: AC
  Administered 2016-06-26 (×3): 10 meq via INTRAVENOUS
  Filled 2016-06-26 (×3): qty 100

## 2016-06-26 MED ORDER — ASPIRIN 81 MG PO TBEC: 81.0000 mg | DELAYED_RELEASE_TABLET | Freq: Every day | ORAL | 0 refills | Status: DC

## 2016-06-26 MED ORDER — HYDROCODONE-ACETAMINOPHEN 5-325 MG PO TABS: 1.0000 | ORAL_TABLET | Freq: Four times a day (QID) | ORAL | 0 refills | Status: DC | PRN

## 2016-06-26 MED ORDER — METOPROLOL SUCCINATE ER 25 MG PO TB24: 12.5000 mg | ORAL_TABLET | Freq: Every day | ORAL | 0 refills | Status: DC

## 2016-06-26 MED ORDER — RAMIPRIL 2.5 MG PO CAPS: 2.5000 mg | ORAL_CAPSULE | Freq: Every day | ORAL | 0 refills | Status: DC

## 2016-06-26 MED ORDER — FUROSEMIDE 10 MG/ML IJ SOLN
40.0000 mg | Freq: Once | INTRAMUSCULAR | Status: AC
  Administered 2016-06-26: 40 mg via INTRAVENOUS
  Filled 2016-06-26: qty 4

## 2016-06-26 MED ORDER — MAGNESIUM SULFATE 2 GM/50ML IV SOLN
2.0000 g | Freq: Once | INTRAVENOUS | Status: AC
  Administered 2016-06-26: 2 g via INTRAVENOUS
  Filled 2016-06-26: qty 50

## 2016-06-26 MED ORDER — POTASSIUM CHLORIDE 20 MEQ PO PACK
40.0000 meq | PACK | Freq: Two times a day (BID) | ORAL | Status: AC
  Administered 2016-06-26 (×2): 40 meq via ORAL
  Filled 2016-06-26 (×2): qty 2

## 2016-06-26 MED ORDER — ENOXAPARIN SODIUM 30 MG/0.3ML ~~LOC~~ SOLN: 30.0000 mg | SUBCUTANEOUS | 0 refills | Status: DC

## 2016-06-26 MED ORDER — METOPROLOL SUCCINATE ER 25 MG PO TB24
12.5000 mg | ORAL_TABLET | Freq: Every day | ORAL | Status: DC
  Administered 2016-06-27: 12.5 mg via ORAL
  Filled 2016-06-26: qty 1

## 2016-06-26 MED ORDER — DOCUSATE SODIUM 100 MG PO CAPS: 100.0000 mg | ORAL_CAPSULE | Freq: Two times a day (BID) | ORAL | 0 refills | Status: DC

## 2016-06-26 MED ORDER — RAMIPRIL 2.5 MG PO CAPS
2.5000 mg | ORAL_CAPSULE | Freq: Every day | ORAL | Status: DC
  Administered 2016-06-26: 2.5 mg via ORAL
  Filled 2016-06-26: qty 1

## 2016-06-26 MED ORDER — FERROUS SULFATE 325 (65 FE) MG PO TABS: 325.0000 mg | ORAL_TABLET | Freq: Two times a day (BID) | ORAL | 0 refills | Status: DC

## 2016-06-26 NOTE — Progress Notes (Signed)
Plan is for patient to D/C to [facility] tomorrow pending medical clearance. Per Kim admissions coordinator at [facility] patient will go to room 323. RN will call report at [phone]. Clinical Education officer, museum (CSW) sent D/C orders to [employer] via [employer] today. [name] [name] is aware of above. CSW will continue to follow and assist as needed.   [employer], LCSW [phone]

## 2016-06-26 NOTE — Discharge Summary (Signed)
[place] [facility] [place] NAME: Emily [name]    MR#:  [id number]  DATE OF BIRTH:  [birth year]  DATE OF ADMISSION:  06/21/2016 ADMITTING PHYSICIAN: Vaughan Basta, MD  DATE OF DISCHARGE: 06/27/2016  PRIMARY CARE PHYSICIAN: Lelon Huh, MD    ADMISSION DIAGNOSIS:  Fall [W19.XXXA] NSTEMI (non-ST elevated myocardial infarction) ([place]) [I21.4] Fall, initial encounter [W19.XXXA] Hip fracture, right, closed, initial encounter ([place]) [S72.001A]  DISCHARGE DIAGNOSIS:  Principal Problem:   Ischemic chest pain ([place]) Active Problems:   NSTEMI (non-ST elevated myocardial infarction) ([place])   Closed right hip fracture ([facility])   Pressure ulcer   SECONDARY DIAGNOSIS:   Past Medical History:  Diagnosis Date  . Arthritis    fingers  . Cancer ([place])    rectal  . Cancer ([place]) 2012   colon cancer  . 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 Colon cancer ([place])   . Difficult intubation   . Diverticulosis   . Hemorrhoids   . History of chicken pox   . Hypertension   . Osteopenia   . Status post chemotherapy    colon cancer  . Status post radiation therapy    colon cancer 2013    HOSPITAL COURSE:   1. Right hip fracture requiring operative repair. Patient did well with surgery please see operative report by Dr. Rudene Christians. Likely will be able to go to rehabilitation out over the weekend. Lovenox for 14 days. 2. Apical ballooning NSTEMI. Patient with cardiomyopathy on echocardiogram. Cardiac catheter was negative which was done prior to the hip repair. Tried to start low-dose Altace and low dose Toprol. Would check blood pressure prior to giving both of these agents. 3. Postoperative anemia. Hemoglobin dropped down to 6.7 patient will be given a unit of blood on 06/26/2016. Recheck hemoglobin in the morning. Can start iron also. 4. History of colon cancer 5. History of laryngeal cancer 6. Depression on fluoxetine 7.  Hypomagnesemia. Replacement given during the hospital course. 8. Hypokalemia. Replaced aggressively on 06/26/2016 recheck in the a.m. 9. Constipation. Colace as needed. Patient had a bowel movement on 06/26/2016 10. Slight bump in creatinine and relative hypotension. Resolved with fluids.  DISCHARGE CONDITIONS:   Fair  CONSULTS OBTAINED:  Treatment Team:  Isaias Cowman, MD Corey Skains, MD Loletha Grayer, MD  DRUG ALLERGIES:   Allergies  Allergen Reactions  . Amlodipine Besylate Other (See Comments)    Patient unaware of any allergy with this medicine  . Oxycodone     confusion    DISCHARGE MEDICATIONS:   Current Discharge Medication List    START taking these medications   Details  aspirin EC 81 MG EC tablet Take 1 tablet (81 mg total) by mouth daily. Qty: 30 tablet, Refills: 0    docusate sodium (COLACE) 100 MG capsule Take 1 capsule (100 mg total) by mouth 2 (two) times daily. Qty: 60 capsule, Refills: 0    enoxaparin (LOVENOX) 30 MG/0.3ML injection Inject 0.3 mLs (30 mg total) into the skin daily. Qty: 14 Syringe, Refills: 0    ferrous sulfate 325 (65 FE) MG tablet Take 1 tablet (325 mg total) by mouth 2 (two) times daily with a meal. Qty: 60 tablet, Refills: 0    HYDROcodone-acetaminophen (NORCO/VICODIN) 5-325 MG tablet Take 1 tablet by mouth every 6 (six) hours as needed (breakthrough pain). Qty: 30 tablet, Refills: 0    ramipril (ALTACE) 2.5 MG capsule Take 1 capsule (2.5 mg total) by mouth at bedtime.  Qty: 30 capsule, Refills: 0      CONTINUE these medications which have CHANGED   Details  metoprolol succinate (TOPROL-XL) 25 MG 24 hr tablet Take 0.5 tablets (12.5 mg total) by mouth daily. Qty: 30 tablet, Refills: 0      CONTINUE these medications which have NOT CHANGED   Details  FLUoxetine (PROZAC) 10 MG tablet Take 1 tablet (10 mg total) by mouth daily. Qty: 30 tablet, Refills: 3    megestrol (MEGACE) 40 MG tablet Take 1 tablet (40 mg  total) by mouth 2 (two) times daily. Qty: 60 tablet, Refills: 2    Nutritional Supplements (ENSURE PLUS PO) Take 1 Can by mouth daily.     nystatin (MYCOSTATIN) 100000 UNIT/ML suspension Take 5 mLs by mouth 4 (four) times daily. X 14 days. Refills: 0         DISCHARGE INSTRUCTIONS:   Follow-up Dr. rehabilitation 1-2 days Follow-up orthopedic surgeon 1-2 weeks  If you experience worsening of your admission symptoms, develop shortness of breath, life threatening emergency, suicidal or homicidal thoughts you must seek medical attention immediately by calling 911 or calling your MD immediately  if symptoms less severe.  You Must read complete instructions/literature along with all the possible adverse reactions/side effects for all the Medicines you take and that have been prescribed to you. Take any new Medicines after you have completely understood and accept all the possible adverse reactions/side effects.   Please note  You were cared for by a hospitalist during your hospital stay. If you have any questions about your discharge medications or the care you received while you were in the hospital after you are discharged, you can call the unit and asked to speak with the hospitalist on call if the hospitalist that took care of you is not available. Once you are discharged, your primary care physician will handle any further medical issues. Please note that NO REFILLS for any discharge medications will be authorized once you are discharged, as it is imperative that you return to your primary care physician (or establish a relationship with a primary care physician if you do not have one) for your aftercare needs so that they can reassess your need for medications and monitor your lab values.    Today   CHIEF COMPLAINT:   Chief Complaint  Patient presents with  . Fall    HISTORY OF PRESENT ILLNESS:  Emily [name]  is a 82 y.o. female presented after a fall and found to have a hip  fracture. Also heart enzyme was very elevated.   VITAL SIGNS:  Blood pressure (!) 143/75, pulse 100, temperature 98 F (36.7 C), temperature source Oral, resp. rate 16, height 5\' 5"  (1.651 m), weight 49.3 kg (108 lb 9.6 oz), SpO2 99 %.   PHYSICAL EXAMINATION:  GENERAL:  82 y.o.-year-old patient lying in the bed with no acute distress.  EYES: Pupils equal, round, reactive to light and accommodation. No scleral icterus. Extraocular muscles intact.  HEENT: Head atraumatic, normocephalic. Oropharynx and nasopharynx clear.  NECK:  Supple, no jugular venous distention. No thyroid enlargement, no tenderness.  LUNGS: Normal breath sounds bilaterally, no wheezing, rales,rhonchi or crepitation. No use of accessory muscles of respiration.  CARDIOVASCULAR: S1, S2 normal. No murmurs, rubs, or gallops.  ABDOMEN: Soft, non-tender, non-distended. Bowel sounds present. No organomegaly or mass.  EXTREMITIES: No pedal edema, cyanosis, or clubbing.  NEUROLOGIC: Cranial nerves II through XII are intact. PSYCHIATRIC: The patient is alert.  SKIN: No obvious rash, lesion, or  ulcer.   DATA REVIEW:   CBC  Recent Labs Lab 06/26/16 0419  WBC 6.0  HGB 6.7*  HCT 19.2*  PLT 113*    Chemistries   Recent Labs Lab 06/21/16 1607  06/26/16 0419  NA 142  < > 138  K 3.7  < > 2.9*  CL 107  < > 114*  CO2 24  < > 19*  GLUCOSE 132*  < > 108*  BUN 41*  < > 31*  CREATININE 1.24*  < > 0.99  CALCIUM 10.6*  < > 7.6*  MG  --   < > 1.6*  AST 76*  --   --   ALT 38  --   --   ALKPHOS 61  --   --   BILITOT 1.1  --   --   < > = values in this interval not displayed.  Cardiac Enzymes  Recent Labs Lab 06/22/16 0238  TROPONINI 2.63*    Management plans discussed with the patient, family and they are in agreement.  CODE STATUS:     Code Status Orders        Start     Ordered   06/24/16 1608  Do not attempt resuscitation (DNR)  Continuous    Question Answer Comment  In the event of cardiac or  respiratory ARREST Do not call a "code blue"   In the event of cardiac or respiratory ARREST Do not perform Intubation, CPR, defibrillation or ACLS   In the event of cardiac or respiratory ARREST Use medication by any route, position, wound care, and other measures to relive pain and suffering. May use oxygen, suction and manual treatment of airway obstruction as needed for comfort.      06/24/16 1607    Code Status History    Date Active Date Inactive Code Status Order ID Comments User Context   06/21/2016  8:29 PM 06/22/2016  9:00 AM DNR [id number]  Vaughan Basta, MD Inpatient    Advance Directive Documentation   Flowsheet Row Most Recent Value  Type of Advance Directive  Healthcare Power of Attorney, Living will, Out of facility DNR (pink MOST or yellow form)  Pre-existing out of facility DNR order (yellow form or pink MOST form)  Yellow form placed in chart (order not valid for inpatient use)  "MOST" Form in Place?  No data      TOTAL TIME TAKING CARE OF THIS PATIENT: 35 minutes.    Loletha Grayer M.D on 06/26/2016 at 1:55 PM  Between 7am to 6pm - Pager - [phone]  After 6pm go to [email or web address] - password [email or web address]  [employer] Office  [phone]  CC: Primary care physician; Lelon Huh, MD

## 2016-06-26 NOTE — Progress Notes (Signed)
Clinical Education officer, museum (CSW) received a call this morning from patient's son Emily [name] stating that they have chosen the private room on the long term care side at [facility]. CSW notified Kim admissions coordinator at [facility] of accepted bed offer. CSW will continue to follow and assist as needed.   [employer], LCSW [phone]

## 2016-06-26 NOTE — Care Management Important Message (Signed)
Important Message  Patient Details  Name: Emily [name] MRN: [id number] Date of Birth: 1933-01-25   Medicare Important Message Given:  Yes    Katrina Stack, RN 06/26/2016, 9:51 AM

## 2016-06-26 NOTE — Progress Notes (Signed)
Subjective: 2 Days Post-Op Procedure(s) (LRB): TOTAL HIP ARTHROPLASTY ANTERIOR APPROACH (Right) Patient reports pain as mild.  Pt is having more pain coming from her lower back today and her surgical hip. Patient is well but bloodwork shows Hg 6.7 and hypokalemia. Denies any CP, SOB, ABD pain. We will continue therapy today.   Objective: Vital signs in last 24 hours: Temp:  [97.7 F (36.5 C)-98.8 F (37.1 C)] 98.8 F (37.1 C) (08/25 0728) Pulse Rate:  [82-92] 82 (08/25 0728) Resp:  [14-16] 14 (08/25 0728) BP: (110-158)/(37-51) 158/51 (08/25 0728) SpO2:  [97 %-100 %] 97 % (08/25 0728)  Intake/Output from previous day: 08/24 0701 - 08/25 0700 In: 1736.3 [P.O.:360; I.V.:1376.3] Out: -  Intake/Output this shift: Total I/O In: 228.6 [I.V.:228.6] Out: -    Recent Labs  06/24/16 0746 06/26/16 0419  HGB 11.5* 6.7*    Recent Labs  06/24/16 0746 06/26/16 0419  WBC 6.2 6.0  RBC 3.59* 2.08*  HCT 33.9* 19.2*  PLT 117* 113*    Recent Labs  06/25/16 0409 06/26/16 0419  NA 138 138  K 3.7 2.9*  CL 111 114*  CO2 17* 19*  BUN 28* 31*  CREATININE 1.32* 0.99  GLUCOSE 144* 108*  CALCIUM 8.1* 7.6*   No results for input(s): LABPT, INR in the last 72 hours.  EXAM General - Patient is Appropriate, Oriented and drowsy this AM. Extremity - Neurovascular intact Sensation intact distally Intact pulses distally Dorsiflexion/Plantar flexion intact No cellulitis present Compartment soft Dressing - dressing C/D/I and no drainage Motor Function - intact, moving foot and toes well on exam.   Past Medical History:  Diagnosis Date  . Arthritis    fingers  . Cancer ([place])    rectal  . Cancer ([place]) 2012   colon cancer  . 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 Colon cancer ([place])   . Difficult intubation   . Diverticulosis   . Hemorrhoids   . History of chicken pox   . Hypertension   . Osteopenia   . Status post chemotherapy    colon cancer  .  Status post radiation therapy    colon cancer 2013    Assessment/Plan:   2 Days Post-Op Procedure(s) (LRB): TOTAL HIP ARTHROPLASTY ANTERIOR APPROACH (Right) Principal Problem:   Ischemic chest pain (HCC) Active Problems:   NSTEMI (non-ST elevated myocardial infarction) ([place])   Closed right hip fracture (HCC)   Pressure ulcer  Estimated body mass index is 18.07 kg/m as calculated from the following:   Height as of this encounter: 5\' 5"  (1.651 m).   Weight as of this encounter: 49.3 kg (108 lb 9.6 oz). Advance diet Up with therapy   Labs reviewed, Hg 6.7 and K+ 2.9 this AM.  Internal medicine aware, they have already ordered IV K+ as well as oral supplements.  Consent being obtained from son for blood transfusion, will leave for internal medicine to place order. Pt has had a BM. May need to hold PT today due to symptoms and possible transfusion. Pain controlled, continue with current pain meds  DVT Prophylaxis - Lovenox, Foot Pumps and TED hose Weight-Bearing as tolerated to right leg   J. Cameron Proud, PA-C [place] 06/26/2016, 8:10 AM

## 2016-06-26 NOTE — Progress Notes (Signed)
OT Cancellation Note  Patient Details Name: CAROLETTE DELRIO MRN: [id number] DOB: January 24, 1933   Cancelled Treatment:    Reason Eval/Treat Not Completed: Medical issues which prohibited therapy.  Pt lethargic and hemoglobin is 6.7 and to receive a blood transfusion today.  Will attempt OT again tomorrow as tolerated.    Chrys Racer, OTR/L ascom [phone] 06/26/16, 2:48 PM

## 2016-06-26 NOTE — Progress Notes (Signed)
PT Cancellation Note  Patient Details Name: Emily [name] MRN: [id number] DOB: 12/01/1932   Cancelled Treatment:    Reason Eval/Treat Not Completed: Patient's level of consciousness Pt was getting a transfusion most of this afternoon, very lethargic and unable to participate.  Deferred PT this PM, will attempt tomorrow afternoon.   Kreg Shropshire 06/26/2016, 5:07 PM

## 2016-06-26 NOTE — Progress Notes (Signed)
Patient ID: Emily [name], female   DOB: 03-Sep-1933, 82 y.o.   MRN: [id number]   [facility] PROGRESS NOTE  Emily [name] [id number] DOB: 1933-02-12 DOA: 06/21/2016 PCP: Lelon Huh, MD  HPI/Subjective: Patient with some right hip pain. No shortness of breath or chest pain. Had a bowel movement this morning.  Objective: Vitals:   06/26/16 1347 06/26/16 1552  BP: (!) 143/75 (!) 141/54  Pulse: 100 80  Resp: 16 18  Temp: 98 F (36.7 C) 98.5 F (36.9 C)    Filed Weights   06/21/16 1559 06/23/16 0505  Weight: 46.4 kg (102 lb 4.8 oz) 49.3 kg (108 lb 9.6 oz)    ROS: Review of Systems  Constitutional: Negative for chills and fever.  Eyes: Negative for blurred vision.  Respiratory: Negative for cough and shortness of breath.   Cardiovascular: Negative for chest pain.  Gastrointestinal: Negative for abdominal pain, constipation, diarrhea, nausea and vomiting.  Genitourinary: Negative for dysuria.  Musculoskeletal: Positive for joint pain.  Neurological: Negative for dizziness and headaches.   Exam: Physical Exam  Constitutional: She is oriented to person, place, and time.  HENT:  Nose: No mucosal edema.  Mouth/Throat: No oropharyngeal exudate or posterior oropharyngeal edema.  Eyes: Conjunctivae, EOM and lids are normal. Pupils are equal, round, and reactive to light.  Neck: No JVD present. Carotid bruit is not present. No edema present. No thyroid mass and no thyromegaly present.  Cardiovascular: S1 normal and S2 normal.  Exam reveals no gallop.   Murmur heard.  Systolic murmur is present with a grade of 2/6  Pulses:      Dorsalis pedis pulses are 2+ on the right side, and 2+ on the left side.  Respiratory: No respiratory distress. She has no wheezes. She has no rhonchi. She has no rales.  GI: Soft. Bowel sounds are normal. There is no tenderness.  Musculoskeletal:       Right ankle: She exhibits no swelling.       Left ankle: She exhibits no swelling.   Lymphadenopathy:    She has no cervical adenopathy.  Neurological: She is alert and oriented to person, place, and time. No cranial nerve deficit.  Skin: Skin is warm. No rash noted. Nails show no clubbing.  Bruising, scabs left face. As per nursing staff stage 2 decubiti buttock and stage 1 on ankle present on admission  Psychiatric: She has a normal mood and affect.      Data Reviewed: Basic Metabolic Panel:  Recent Labs Lab 06/22/16 0238 06/23/16 0451 06/24/16 0516 06/25/16 0409 06/26/16 0419  NA 143 141 139 138 138  K 3.0* 3.2* 3.6 3.7 2.9*  CL 114* 117* 115* 111 114*  CO2 22 18* 19* 17* 19*  GLUCOSE 106* 91 101* 144* 108*  BUN 37* 32* 22* 28* 31*  CREATININE 0.92 0.90 0.94 1.32* 0.99  CALCIUM 9.1 8.1* 8.0* 8.1* 7.6*  MG  --  1.6* 1.9 1.8 1.6*   Liver Function Tests:  Recent Labs Lab 06/21/16 1607  AST 76*  ALT 38  ALKPHOS 61  BILITOT 1.1  PROT 7.0  ALBUMIN 3.8   CBC:  Recent Labs Lab 06/21/16 1607 06/22/16 0238 06/23/16 0451 06/24/16 0746 06/26/16 0419  WBC 9.5 8.0 5.2 6.2 6.0  HGB 15.5 13.2 11.0* 11.5* 6.7*  HCT 44.5 38.9 32.2* 33.9* 19.2*  MCV 93.5 93.3 93.3 94.3 92.7  PLT 150 123* 121* 117* 113*   Cardiac Enzymes:  Recent Labs Lab 06/21/16 1607 06/21/16 1830 06/21/16  2254 06/22/16 0238  CKTOTAL 767*  --   --   --   TROPONINI 2.88* 3.58* 2.83* 2.63*    Scheduled Meds: . aspirin EC  81 mg Oral Daily  . docusate sodium  100 mg Oral BID  . [START ON 06/27/2016] enoxaparin (LOVENOX) injection  40 mg Subcutaneous Q24H  . feeding supplement (ENSURE ENLIVE)  1 Bottle Oral TID BM  . ferrous sulfate  325 mg Oral BID WC  . FLUoxetine  10 mg Oral Daily  . megestrol  40 mg Oral BID  . [START ON 06/27/2016] metoprolol succinate  12.5 mg Oral Daily  . nystatin  5 mL Oral QID  . potassium chloride  40 mEq Oral BID  . ramipril  2.5 mg Oral QHS  . sodium chloride flush  3 mL Intravenous Q12H    Assessment/Plan:  1. Post operative anemia.  Transfuse 1 unit of packed red blood cells over 4 hours. Start oral iron.  2. Apical ballooning NSTEMI- cardiac cath negative. Patient has cardiomyopathy on echo.  Since blood pressure has come up, restart low-dose Toprol XL and try to do low dose Altace at night. Aspirin also ordered. 3. Right hip fracture requiring operative repair. Patient likely out to rehabilitation over the weekend. Lovenox prescribed 14 days 4. Hx colon cancer, Hx larygeal cancer 5. Depression- fluoxetine 6. Hypomagnesemia. Replace IV today 7. Hypokalemia replace IV and orally today. Recheck tomorrow morning 8. Constipation. Patient had bowel movement. 9. Stage II decubitus ulcer on buttock and stage I on left ankle. Local wound care.   Code Status:     Code Status Orders        Start     Ordered   06/21/16 2030  Do not attempt resuscitation (DNR)  Continuous    Question Answer Comment  In the event of cardiac or respiratory ARREST Do not call a "code blue"   In the event of cardiac or respiratory ARREST Do not perform Intubation, CPR, defibrillation or ACLS   In the event of cardiac or respiratory ARREST Use medication by any route, position, wound care, and other measures to relive pain and suffering. May use oxygen, suction and manual treatment of airway obstruction as needed for comfort.      06/21/16 2029    Code Status History    Date Active Date Inactive Code Status Order ID Comments User Context   06/21/2016  8:29 PM 06/22/2016  9:00 AM DNR [id number]  Vaughan Basta, MD Inpatient    Questions for Most Recent Historical Code Status (Order [id number])    Question Answer Comment   In the event of cardiac or respiratory ARREST Do not call a "code blue"    In the event of cardiac or respiratory ARREST Do not perform Intubation, CPR, defibrillation or ACLS    In the event of cardiac or respiratory ARREST Use medication by any route, position, wound care, and other measures to relive pain and suffering.  May use oxygen, suction and manual treatment of airway obstruction as needed for comfort.         Advance Directive Documentation   Flowsheet Row Most Recent Value  Type of Advance Directive  Healthcare Power of [place], [place] Directive  Pre-existing out of facility DNR order (yellow form or pink MOST form)  No data  "MOST" Form in Place?  No data     Family Communication: Spoke with son on the phone this morning about blood transfusion Disposition Plan: Will need rehabilitation over  the weekend  Consultants:  Cardiology  Orthopedic surgery  Time spent: 30 minutes  [place], [place]

## 2016-06-26 NOTE — Progress Notes (Signed)
Physical Therapy Treatment Patient Details Name: Emily [name] MRN: [id number] DOB: [birth year] Today's Date: 06/26/2016    History of Present Illness Pt is a 82 y.o. female who lives at home alone and fell and was not found for 2 days until her son went by the home. Pt was found confused at that time. She was found to be dehydrated, and her troponin was very high, right hip fracture found on x-ray. She underwent R THA (anterior) by Dr Rudene Christians on 06-24-16.  She has a known history of arthritis, throat cancer status post feeding tube, radiation/chemotherapy, colon cancer, hypertension, osteopenia. She has had some episodes of forgetfulness for last few months.    PT Comments    Pt is limited with PT today secondary to very low energy level, hemoglobin is 6.7 and pt needs a transfusion.  We deferred any mobility activities and did only in-bed exercises.  Pt able to show brief bouts of multiple reps in a row, but is lethargic and closes eyes and needs close cuing to insure she keeps going with exercises.  Plan to see pt after transfusion this afternoon.   Follow Up Recommendations  SNF     Equipment Recommendations       Recommendations for Other Services       Precautions / Restrictions Precautions Precautions: Anterior Hip;Fall Restrictions Weight Bearing Restrictions: Yes RLE Weight Bearing: Weight bearing as tolerated    Mobility  Bed Mobility               General bed mobility comments: deferred any mobility acts today secondary to lethargy and HGB being 6.7  Transfers                    Ambulation/Gait                 Stairs            Wheelchair Mobility    Modified Rankin (Stroke Patients Only)       Balance                                    Cognition Arousal/Alertness: Lethargic Behavior During Therapy: Flat affect Overall Cognitive Status: Difficult to assess                      Exercises Total Joint  Exercises Ankle Circles/Pumps: 10 reps;AAROM Quad Sets: Strengthening;10 reps Gluteal Sets: 10 reps;Strengthening Short Arc Quad: AROM;10 reps Heel Slides: 10 reps;AAROM Hip ABduction/ADduction: AAROM;10 reps    General Comments        Pertinent Vitals/Pain Pain Assessment:  (Pt only indicates pain with full ROM, does not rate)    Home Living                      Prior Function            PT Goals (current goals can now be found in the care plan section) Progress towards PT goals: Not progressing toward goals - comment (pt too weak to get up, needs transfusion)    Frequency  BID    PT Plan Current plan remains appropriate    Co-evaluation             End of Session   Activity Tolerance: Patient limited by lethargy;Patient limited by fatigue Patient left: with bed alarm set;with call bell/phone within reach  Time: [id number] PT Time Calculation (min) (ACUTE ONLY): 17 min  Charges:  $Therapeutic Exercise: 8-22 mins                    G Codes:      Kreg Shropshire , DPT 06/26/2016, 10:19 AM

## 2016-06-26 NOTE — Progress Notes (Signed)
Patient moved to new location per family request. Updated patients status to ensure that patient is XXX. Nursing staff notified and operator.

## 2016-06-27 DIAGNOSIS — D649 Anemia, unspecified: Secondary | ICD-10-CM | POA: Diagnosis not present

## 2016-06-27 DIAGNOSIS — D638 Anemia in other chronic diseases classified elsewhere: Secondary | ICD-10-CM | POA: Diagnosis present

## 2016-06-27 DIAGNOSIS — I214 Non-ST elevation (NSTEMI) myocardial infarction: Secondary | ICD-10-CM | POA: Diagnosis not present

## 2016-06-27 DIAGNOSIS — Z90710 Acquired absence of both cervix and uterus: Secondary | ICD-10-CM | POA: Diagnosis not present

## 2016-06-27 DIAGNOSIS — L03311 Cellulitis of abdominal wall: Secondary | ICD-10-CM | POA: Diagnosis not present

## 2016-06-27 DIAGNOSIS — Z85818 Personal history of malignant neoplasm of other sites of lip, oral cavity, and pharynx: Secondary | ICD-10-CM | POA: Diagnosis not present

## 2016-06-27 DIAGNOSIS — Z9221 Personal history of antineoplastic chemotherapy: Secondary | ICD-10-CM | POA: Diagnosis not present

## 2016-06-27 DIAGNOSIS — Z7982 Long term (current) use of aspirin: Secondary | ICD-10-CM | POA: Diagnosis not present

## 2016-06-27 DIAGNOSIS — Z931 Gastrostomy status: Secondary | ICD-10-CM | POA: Diagnosis not present

## 2016-06-27 DIAGNOSIS — K529 Noninfective gastroenteritis and colitis, unspecified: Secondary | ICD-10-CM | POA: Diagnosis not present

## 2016-06-27 DIAGNOSIS — M6281 Muscle weakness (generalized): Secondary | ICD-10-CM | POA: Diagnosis not present

## 2016-06-27 DIAGNOSIS — M810 Age-related osteoporosis without current pathological fracture: Secondary | ICD-10-CM | POA: Diagnosis not present

## 2016-06-27 DIAGNOSIS — S72001D Fracture of unspecified part of neck of right femur, subsequent encounter for closed fracture with routine healing: Secondary | ICD-10-CM | POA: Diagnosis not present

## 2016-06-27 DIAGNOSIS — F325 Major depressive disorder, single episode, in full remission: Secondary | ICD-10-CM | POA: Diagnosis not present

## 2016-06-27 DIAGNOSIS — K922 Gastrointestinal hemorrhage, unspecified: Secondary | ICD-10-CM | POA: Diagnosis not present

## 2016-06-27 DIAGNOSIS — M25551 Pain in right hip: Secondary | ICD-10-CM | POA: Diagnosis not present

## 2016-06-27 DIAGNOSIS — R262 Difficulty in walking, not elsewhere classified: Secondary | ICD-10-CM | POA: Diagnosis not present

## 2016-06-27 DIAGNOSIS — S72001A Fracture of unspecified part of neck of right femur, initial encounter for closed fracture: Secondary | ICD-10-CM | POA: Diagnosis not present

## 2016-06-27 DIAGNOSIS — Z85819 Personal history of malignant neoplasm of unspecified site of lip, oral cavity, and pharynx: Secondary | ICD-10-CM | POA: Diagnosis not present

## 2016-06-27 DIAGNOSIS — Z681 Body mass index (BMI) 19 or less, adult: Secondary | ICD-10-CM | POA: Diagnosis not present

## 2016-06-27 DIAGNOSIS — Z803 Family history of malignant neoplasm of breast: Secondary | ICD-10-CM | POA: Diagnosis not present

## 2016-06-27 DIAGNOSIS — R1310 Dysphagia, unspecified: Secondary | ICD-10-CM | POA: Diagnosis present

## 2016-06-27 DIAGNOSIS — Z9889 Other specified postprocedural states: Secondary | ICD-10-CM | POA: Diagnosis not present

## 2016-06-27 DIAGNOSIS — Z7401 Bed confinement status: Secondary | ICD-10-CM | POA: Diagnosis not present

## 2016-06-27 DIAGNOSIS — F329 Major depressive disorder, single episode, unspecified: Secondary | ICD-10-CM | POA: Diagnosis not present

## 2016-06-27 DIAGNOSIS — R4182 Altered mental status, unspecified: Secondary | ICD-10-CM | POA: Diagnosis not present

## 2016-06-27 DIAGNOSIS — I429 Cardiomyopathy, unspecified: Secondary | ICD-10-CM | POA: Diagnosis not present

## 2016-06-27 DIAGNOSIS — F17210 Nicotine dependence, cigarettes, uncomplicated: Secondary | ICD-10-CM | POA: Diagnosis present

## 2016-06-27 DIAGNOSIS — D62 Acute posthemorrhagic anemia: Secondary | ICD-10-CM | POA: Diagnosis not present

## 2016-06-27 DIAGNOSIS — E440 Moderate protein-calorie malnutrition: Secondary | ICD-10-CM | POA: Diagnosis present

## 2016-06-27 DIAGNOSIS — L89512 Pressure ulcer of right ankle, stage 2: Secondary | ICD-10-CM | POA: Diagnosis not present

## 2016-06-27 DIAGNOSIS — A499 Bacterial infection, unspecified: Secondary | ICD-10-CM | POA: Diagnosis not present

## 2016-06-27 DIAGNOSIS — R64 Cachexia: Secondary | ICD-10-CM | POA: Diagnosis present

## 2016-06-27 DIAGNOSIS — Z841 Family history of disorders of kidney and ureter: Secondary | ICD-10-CM | POA: Diagnosis not present

## 2016-06-27 DIAGNOSIS — R0989 Other specified symptoms and signs involving the circulatory and respiratory systems: Secondary | ICD-10-CM | POA: Diagnosis not present

## 2016-06-27 DIAGNOSIS — M25552 Pain in left hip: Secondary | ICD-10-CM | POA: Diagnosis not present

## 2016-06-27 DIAGNOSIS — E46 Unspecified protein-calorie malnutrition: Secondary | ICD-10-CM | POA: Diagnosis not present

## 2016-06-27 DIAGNOSIS — I10 Essential (primary) hypertension: Secondary | ICD-10-CM | POA: Diagnosis present

## 2016-06-27 DIAGNOSIS — Z885 Allergy status to narcotic agent status: Secondary | ICD-10-CM | POA: Diagnosis not present

## 2016-06-27 DIAGNOSIS — Z85048 Personal history of other malignant neoplasm of rectum, rectosigmoid junction, and anus: Secondary | ICD-10-CM | POA: Diagnosis not present

## 2016-06-27 DIAGNOSIS — L89152 Pressure ulcer of sacral region, stage 2: Secondary | ICD-10-CM | POA: Diagnosis not present

## 2016-06-27 DIAGNOSIS — M859 Disorder of bone density and structure, unspecified: Secondary | ICD-10-CM | POA: Diagnosis not present

## 2016-06-27 DIAGNOSIS — Z431 Encounter for attention to gastrostomy: Secondary | ICD-10-CM | POA: Diagnosis not present

## 2016-06-27 DIAGNOSIS — B3789 Other sites of candidiasis: Secondary | ICD-10-CM | POA: Diagnosis not present

## 2016-06-27 DIAGNOSIS — B369 Superficial mycosis, unspecified: Secondary | ICD-10-CM | POA: Diagnosis not present

## 2016-06-27 DIAGNOSIS — R4789 Other speech disturbances: Secondary | ICD-10-CM | POA: Diagnosis not present

## 2016-06-27 DIAGNOSIS — Z79899 Other long term (current) drug therapy: Secondary | ICD-10-CM | POA: Diagnosis not present

## 2016-06-27 DIAGNOSIS — G9340 Encephalopathy, unspecified: Secondary | ICD-10-CM | POA: Diagnosis not present

## 2016-06-27 DIAGNOSIS — Z923 Personal history of irradiation: Secondary | ICD-10-CM | POA: Diagnosis not present

## 2016-06-27 DIAGNOSIS — Z1612 Extended spectrum beta lactamase (ESBL) resistance: Secondary | ICD-10-CM | POA: Diagnosis not present

## 2016-06-27 DIAGNOSIS — R05 Cough: Secondary | ICD-10-CM | POA: Diagnosis not present

## 2016-06-27 DIAGNOSIS — Z87891 Personal history of nicotine dependence: Secondary | ICD-10-CM | POA: Diagnosis not present

## 2016-06-27 DIAGNOSIS — R1312 Dysphagia, oropharyngeal phase: Secondary | ICD-10-CM | POA: Diagnosis not present

## 2016-06-27 DIAGNOSIS — Z8521 Personal history of malignant neoplasm of larynx: Secondary | ICD-10-CM | POA: Diagnosis not present

## 2016-06-27 DIAGNOSIS — Z9181 History of falling: Secondary | ICD-10-CM | POA: Diagnosis not present

## 2016-06-27 DIAGNOSIS — Z96641 Presence of right artificial hip joint: Secondary | ICD-10-CM | POA: Diagnosis present

## 2016-06-27 DIAGNOSIS — L8990 Pressure ulcer of unspecified site, unspecified stage: Secondary | ICD-10-CM | POA: Diagnosis present

## 2016-06-27 DIAGNOSIS — R627 Adult failure to thrive: Secondary | ICD-10-CM | POA: Diagnosis not present

## 2016-06-27 DIAGNOSIS — Z888 Allergy status to other drugs, medicaments and biological substances status: Secondary | ICD-10-CM | POA: Diagnosis not present

## 2016-06-27 LAB — CBC
HEMATOCRIT: 28.8 % — AB (ref 35.0–47.0)
Hemoglobin: 10.3 g/dL — ABNORMAL LOW (ref 12.0–16.0)
MCH: 32.6 pg (ref 26.0–34.0)
MCHC: 35.7 g/dL (ref 32.0–36.0)
MCV: 91.3 fL (ref 80.0–100.0)
PLATELETS: 153 10*3/uL (ref 150–440)
RBC: 3.16 MIL/uL — ABNORMAL LOW (ref 3.80–5.20)
RDW: 14.5 % (ref 11.5–14.5)
WBC: 7.2 10*3/uL (ref 3.6–11.0)

## 2016-06-27 LAB — MAGNESIUM: Magnesium: 1.9 mg/dL (ref 1.7–2.4)

## 2016-06-27 LAB — BASIC METABOLIC PANEL WITH GFR
Anion gap: 4 — ABNORMAL LOW (ref 5–15)
BUN: 23 mg/dL — ABNORMAL HIGH (ref 6–20)
CO2: 24 mmol/L (ref 22–32)
Calcium: 8.1 mg/dL — ABNORMAL LOW (ref 8.9–10.3)
Chloride: 110 mmol/L (ref 101–111)
Creatinine, Ser: 1.08 mg/dL — ABNORMAL HIGH (ref 0.44–1.00)
GFR calc Af Amer: 55 mL/min — ABNORMAL LOW
GFR calc non Af Amer: 47 mL/min — ABNORMAL LOW
Glucose, Bld: 99 mg/dL (ref 65–99)
Potassium: 4 mmol/L (ref 3.5–5.1)
Sodium: 138 mmol/L (ref 135–145)

## 2016-06-27 LAB — TYPE AND SCREEN
ABO/RH(D): B POS
ANTIBODY SCREEN: NEGATIVE
Unit division: 0

## 2016-06-27 MED ORDER — LACTULOSE 10 GM/15ML PO SOLN
20.0000 g | Freq: Two times a day (BID) | ORAL | 0 refills | Status: DC | PRN
Start: 2016-06-27 — End: 2018-03-16

## 2016-06-27 NOTE — Clinical Social Work Note (Signed)
Patient to dc to [facility] Room 323 via non-emergent EMS. [name] [name] and facility aware. CSW will be available for any additional dc needs.  Santiago Bumpers, MSW, LCSW-A [phone]

## 2016-06-27 NOTE — Progress Notes (Signed)
ORTHOPAEDICS PROGRESS NOTE  PATIENT NAME: Emily [name] DOB: 1933/10/16  MRN: [id number]  POD # 3: Right total hip arthroplasty for femoral neck fracture  Subjective: The patient is awake and alert, although somewhat drowsy. She denies any significant right hip pain.  Objective: Vital signs in last 24 hours: Temp:  [98 F (36.7 C)-98.6 F (37 C)] 98.5 F (36.9 C) (08/26 1136) Pulse Rate:  [75-108] 108 (08/26 1136) Resp:  [16-22] 22 (08/26 1136) BP: (113-143)/(43-75) 113/43 (08/26 1136) SpO2:  [98 %-100 %] 100 % (08/26 1136)  Intake/Output from previous day: 08/25 0701 - 08/26 0700 In: 881.6 [P.O.:360; I.V.:231.6; Blood:290] Out: 900 [Urine:900]   Recent Labs  06/26/16 0419 06/26/16 1610 06/27/16 0515  WBC 6.0 8.3 7.2  HGB 6.7* 10.7* 10.3*  HCT 19.2* 31.0* 28.8*  PLT 113* 136* 153  K 2.9*  --  4.0  CL 114*  --  110  CO2 19*  --  24  BUN 31*  --  23*  CREATININE 0.99  --  1.08*  GLUCOSE 108*  --  99  CALCIUM 7.6*  --  8.1*    EXAM General: Awake and alert. Right lower extremity: Anterior right hip dressing is intact. Calf is supple. Homans test is negative.  Assessment: Right total hip arthroplasty for femoral neck fracture as per Dr. Hessie Knows  Principal Problem:   Ischemic chest pain [facility]) Active Problems:   NSTEMI (non-ST elevated myocardial infarction) ([place])   Closed right hip fracture (HCC)   Pressure ulcer Acute blood loss anemia, now stable after transfusion yesterday  Plan: Notes from physical therapy were reviewed. The patient is making extremely slow progress. Plan is to go Skilled nursing facility today. DVT Prophylaxis - as per Medicine. Continue PT/OT at the skilled nursing facility.  Mailey Landstrom P. Holley Bouche M.D.

## 2016-06-27 NOTE — Discharge Summary (Signed)
Emily [name] at [facility] NAME: Emily [name]    MR#:  [id number]  DATE OF BIRTH:  [birth year]  DATE OF ADMISSION:  06/21/2016 ADMITTING PHYSICIAN: Vaughan Basta, MD  DATE OF DISCHARGE: 06/27/2016  PRIMARY CARE PHYSICIAN: Lelon Huh, MD    ADMISSION DIAGNOSIS:  Fall [W19.XXXA] NSTEMI (non-ST elevated myocardial infarction) ([place]) [I21.4] Fall, initial encounter [W19.XXXA] Hip fracture, right, closed, initial encounter ([place]) [S72.001A]  DISCHARGE DIAGNOSIS:  Principal Problem:   Ischemic chest pain ([place]) Active Problems:  Apical cardiac learning syndrome   Closed right hip fracture ([facility])   Pressure ulcer   Acute blood loss anemia  Hypokalemia hypomagnesemia  SECONDARY DIAGNOSIS:   Past Medical History:  Diagnosis Date  . Arthritis    fingers  . Cancer ([place])    rectal  . Cancer ([place]) 2012   colon cancer  . 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 Colon cancer ([place])   . Difficult intubation   . Diverticulosis   . Hemorrhoids   . History of chicken pox   . Hypertension   . Osteopenia   . Status post chemotherapy    colon cancer  . Status post radiation therapy    colon cancer 2013    HOSPITAL COURSE:   1. Right hip fracture requiring operative repair. Patient did well with surgery please see operative report by Dr. Rudene Christians.Currently being arranged to go to rehabilitation 2. Apical ballooning NSTEMI. Patient with cardiomyopathy on echocardiogram. Cardiac catheter was negative which was done prior to the hip repair. Patient on low-dose Altace and low dose Toprol.  3. Postoperative anemia. Hemoglobin dropped down to 6.7 patient will be given a unit of blood on 06/26/2016. Hemoglobin is currently stable 4. History of colon cancer 5. History of laryngeal cancer 6. Depression on fluoxetine 7. Hypomagnesemia. Replacement given during the hospital course. 8. Hypokalemia. Replaced aggressively on  06/26/2016 recheck in the a.m. 9. Constipation. Colace as needed. Patient had a bowel movement on 06/26/2016 10. Slight bump in creatinine and relative hypotension. Resolved with fluids.  DISCHARGE CONDITIONS:   Fair  CONSULTS OBTAINED:  Treatment Team:  Isaias Cowman, MD Corey Skains, MD Loletha Grayer, MD  DRUG ALLERGIES:   Allergies  Allergen Reactions  . Amlodipine Besylate Other (See Comments)    Patient unaware of any allergy with this medicine  . Oxycodone     confusion    DISCHARGE MEDICATIONS:   Current Discharge Medication List    START taking these medications   Details  aspirin EC 81 MG EC tablet Take 1 tablet (81 mg total) by mouth daily. Qty: 30 tablet, Refills: 0    docusate sodium (COLACE) 100 MG capsule Take 1 capsule (100 mg total) by mouth 2 (two) times daily. Qty: 60 capsule, Refills: 0    enoxaparin (LOVENOX) 30 MG/0.3ML injection Inject 0.3 mLs (30 mg total) into the skin daily. Qty: 14 Syringe, Refills: 0    ferrous sulfate 325 (65 FE) MG tablet Take 1 tablet (325 mg total) by mouth 2 (two) times daily with a meal. Qty: 60 tablet, Refills: 0    HYDROcodone-acetaminophen (NORCO/VICODIN) 5-325 MG tablet Take 1 tablet by mouth every 6 (six) hours as needed (breakthrough pain). Qty: 30 tablet, Refills: 0    lactulose (CHRONULAC) 10 GM/15ML solution Take 30 mLs (20 g total) by mouth 2 (two) times daily as needed for moderate constipation or severe constipation. Qty: 240 mL, Refills: 0    ramipril (ALTACE) 2.5  MG capsule Take 1 capsule (2.5 mg total) by mouth at bedtime. Qty: 30 capsule, Refills: 0      CONTINUE these medications which have CHANGED   Details  metoprolol succinate (TOPROL-XL) 25 MG 24 hr tablet Take 0.5 tablets (12.5 mg total) by mouth daily. Qty: 30 tablet, Refills: 0      CONTINUE these medications which have NOT CHANGED   Details  FLUoxetine (PROZAC) 10 MG tablet Take 1 tablet (10 mg total) by mouth  daily. Qty: 30 tablet, Refills: 3    megestrol (MEGACE) 40 MG tablet Take 1 tablet (40 mg total) by mouth 2 (two) times daily. Qty: 60 tablet, Refills: 2    Nutritional Supplements (ENSURE PLUS PO) Take 1 Can by mouth daily.     nystatin (MYCOSTATIN) 100000 UNIT/ML suspension Take 5 mLs by mouth 4 (four) times daily. X 14 days. Refills: 0         DISCHARGE INSTRUCTIONS:   Follow-up Dr. rehabilitation 1-2 days Follow-up orthopedic surgeon 1-2 weeks  If you experience worsening of your admission symptoms, develop shortness of breath, life threatening emergency, suicidal or homicidal thoughts you must seek medical attention immediately by calling 911 or calling your MD immediately  if symptoms less severe.  You Must read complete instructions/literature along with all the possible adverse reactions/side effects for all the Medicines you take and that have been prescribed to you. Take any new Medicines after you have completely understood and accept all the possible adverse reactions/side effects.   Please note  You were cared for by a hospitalist during your hospital stay. If you have any questions about your discharge medications or the care you received while you were in the hospital after you are discharged, you can call the unit and asked to speak with the hospitalist on call if the hospitalist that took care of you is not available. Once you are discharged, your primary care physician will handle any further medical issues. Please note that NO REFILLS for any discharge medications will be authorized once you are discharged, as it is imperative that you return to your primary care physician (or establish a relationship with a primary care physician if you do not have one) for your aftercare needs so that they can reassess your need for medications and monitor your lab values.    Today   CHIEF COMPLAINT:   Chief Complaint  Patient presents with  . Fall    HISTORY OF PRESENT  ILLNESS:  Emily [name]  is a 82 y.o. female presented after a fall and found to have a hip fracture. Also heart enzyme was very elevated.   VITAL SIGNS:  Blood pressure (!) 140/49, pulse 87, temperature 98.6 F (37 C), temperature source Oral, resp. rate 16, height 5\' 5"  (1.651 m), weight 49.3 kg (108 lb 9.6 oz), SpO2 98 %.   PHYSICAL EXAMINATION:  GENERAL:  82 y.o.-year-old patient lying in the bed with no acute distress.  EYES: Pupils equal, round, reactive to light and accommodation. No scleral icterus. Extraocular muscles intact.  HEENT: Head atraumatic, normocephalic. Oropharynx and nasopharynx clear.  NECK:  Supple, no jugular venous distention. No thyroid enlargement, no tenderness.  LUNGS: Normal breath sounds bilaterally, no wheezing, rales,rhonchi or crepitation. No use of accessory muscles of respiration.  CARDIOVASCULAR: S1, S2 normal. No murmurs, rubs, or gallops.  ABDOMEN: Soft, non-tender, non-distended. Bowel sounds present. No organomegaly or mass.  EXTREMITIES: No pedal edema, cyanosis, or clubbing.  NEUROLOGIC: Cranial nerves II through XII are intact.  PSYCHIATRIC: The patient is alert.  SKIN: No obvious rash, lesion, or ulcer.   DATA REVIEW:   CBC  Recent Labs Lab 06/27/16 0515  WBC 7.2  HGB 10.3*  HCT 28.8*  PLT 153    Chemistries   Recent Labs Lab 06/21/16 1607  06/27/16 0515  NA 142  < > 138  K 3.7  < > 4.0  CL 107  < > 110  CO2 24  < > 24  GLUCOSE 132*  < > 99  BUN 41*  < > 23*  CREATININE 1.24*  < > 1.08*  CALCIUM 10.6*  < > 8.1*  MG  --   < > 1.9  AST 76*  --   --   ALT 38  --   --   ALKPHOS 61  --   --   BILITOT 1.1  --   --   < > = values in this interval not displayed.  Cardiac Enzymes  Recent Labs Lab 06/22/16 0238  TROPONINI 2.63*    Management plans discussed with the patient, family and they are in agreement.  CODE STATUS:     Code Status Orders        Start     Ordered   06/24/16 1608  Do not attempt  resuscitation (DNR)  Continuous    Question Answer Comment  In the event of cardiac or respiratory ARREST Do not call a "code blue"   In the event of cardiac or respiratory ARREST Do not perform Intubation, CPR, defibrillation or ACLS   In the event of cardiac or respiratory ARREST Use medication by any route, position, wound care, and other measures to relive pain and suffering. May use oxygen, suction and manual treatment of airway obstruction as needed for comfort.      06/24/16 1607    Code Status History    Date Active Date Inactive Code Status Order ID Comments User Context   06/21/2016  8:29 PM 06/22/2016  9:00 AM DNR [id number]  Vaughan Basta, MD Inpatient    Advance Directive Documentation   Flowsheet Row Most Recent Value  Type of Advance Directive  Healthcare Power of Attorney, Living will, Out of facility DNR (pink MOST or yellow form)  Pre-existing out of facility DNR order (yellow form or pink MOST form)  Yellow form placed in chart (order not valid for inpatient use)  "MOST" Form in Place?  No data      TOTAL TIME TAKING CARE OF THIS PATIENT: 35 minutes.    Dustin Flock M.D on 06/27/2016 at 11:00 AM  Between 7am to 6pm - Pager - [phone]  After 6pm go to [email or web address] - password [email or web address]  [facility] Office  [phone]  CC: Primary care physician; Lelon Huh, MD

## 2016-06-27 NOTE — Progress Notes (Signed)
Physical Therapy Treatment Patient Details Name: Emily [name] MRN: [id number] DOB: 06/14/1933 Today's Date: 06/27/2016    History of Present Illness Pt is a 82 y.o. female who lives at home alone and fell and was not found for 2 days until her son went by the home. Pt was found confused at that time. She was found to be dehydrated, and her troponin was very high, right hip fracture found on x-ray. She underwent R THA (anterior) by Dr Rudene Christians on 06-24-16.  She has a known history of arthritis, throat cancer status post feeding tube, radiation/chemotherapy, colon cancer, hypertension, osteopenia. She has had some episodes of forgetfulness for last few months.    PT Comments    Pt is able to open eyes and answer basic questions, but remains relatively lethargic.  She is able to ambulate with very slow limited gait but fatigues very quickly with the effort.  She is able to do some light exercises in bed but again is low energy and overall limited with what she is functionally able to do.  She did need significant assist with bed mobility.  Follow Up Recommendations  SNF     Equipment Recommendations       Recommendations for Other Services       Precautions / Restrictions Precautions Precautions: Anterior Hip;Fall Restrictions Weight Bearing Restrictions: Yes RLE Weight Bearing: Weight bearing as tolerated    Mobility  Bed Mobility Overal bed mobility: Needs Assistance Bed Mobility: Supine to Sit     Supine to sit: Mod assist     General bed mobility comments: Pt shows very little initiation of getting to EOB despite PT giving a lot of cuing and extra time to attempt.  Pt acknowledging PT asking her to do something but with little functional action  Transfers Overall transfer level: Needs assistance Equipment used: Rolling walker (2 wheeled) Transfers: Sit to/from Stand Sit to Stand: Mod assist         General transfer comment: Pt is able to rise to standing with  cuing and set up.  She again is slow to initiate movement and appears low energy.  Pt safe once up in standing.  Ambulation/Gait Ambulation/Gait assistance: Min assist Ambulation Distance (Feet): 8 Feet Assistive device: Rolling walker (2 wheeled)       General Gait Details: Pt able to take some limited small steps with walker but fatigues very quickly and requests to sit down after relatively limited distance.  She is able to use the walker and maintain balance but overall is hesitant and very slow.  She does not have any overt LOBs, but is unsteady and needs close assist.   Stairs            Wheelchair Mobility    Modified Rankin (Stroke Patients Only)       Balance                                    Cognition Arousal/Alertness: Lethargic Behavior During Therapy: Flat affect Overall Cognitive Status: Difficult to assess                      Exercises Total Joint Exercises Ankle Circles/Pumps: 10 reps;AAROM Quad Sets: Strengthening;10 reps Gluteal Sets: 10 reps;Strengthening Short Arc Quad: 15 reps;AROM;AAROM Heel Slides: 10 reps;AAROM Hip ABduction/ADduction: 15 reps;AROM;AAROM Straight Leg Raises: PROM;5 reps    General Comments  Pertinent Vitals/Pain Pain Score:  (not rated, reports "not much," has minimal increase with mvt)    Home Living                      Prior Function            PT Goals (current goals can now be found in the care plan section) Progress towards PT goals: Progressing toward goals    Frequency  BID    PT Plan Current plan remains appropriate    Co-evaluation             End of Session Equipment Utilized During Treatment: Gait belt Activity Tolerance: Patient limited by lethargy;Patient limited by fatigue Patient left: with chair alarm set;with call bell/phone within reach     Time: 0837-0902 PT Time Calculation (min) (ACUTE ONLY): 25 min  Charges:  $Gait Training: 8-22  mins $Therapeutic Exercise: 8-22 mins                    G Codes:      Kreg Shropshire , DPT 06/27/2016, 11:13 AM

## 2016-06-29 DIAGNOSIS — G9340 Encephalopathy, unspecified: Secondary | ICD-10-CM | POA: Diagnosis not present

## 2016-06-29 DIAGNOSIS — M810 Age-related osteoporosis without current pathological fracture: Secondary | ICD-10-CM | POA: Diagnosis not present

## 2016-06-29 DIAGNOSIS — I429 Cardiomyopathy, unspecified: Secondary | ICD-10-CM | POA: Diagnosis not present

## 2016-06-29 DIAGNOSIS — F325 Major depressive disorder, single episode, in full remission: Secondary | ICD-10-CM | POA: Diagnosis not present

## 2016-06-29 DIAGNOSIS — E46 Unspecified protein-calorie malnutrition: Secondary | ICD-10-CM | POA: Diagnosis not present

## 2016-06-30 ENCOUNTER — Inpatient Hospital Stay: Payer: Medicare Other | Attending: Oncology

## 2016-07-02 LAB — VITAMIN B12: VITAMIN B 12: 365 pg/mL (ref 180–914)

## 2016-07-02 LAB — TSH: TSH: 5.193 u[IU]/mL — ABNORMAL HIGH (ref 0.350–4.500)

## 2016-07-03 ENCOUNTER — Encounter
Admission: RE | Admit: 2016-07-03 | Discharge: 2016-07-03 | Disposition: A | Discharge status: Home / Self Care | Payer: Medicare Other | Source: Ambulatory Visit | Attending: Internal Medicine | Admitting: Internal Medicine

## 2016-07-05 ENCOUNTER — Other Ambulatory Visit
Admission: RE | Admit: 2016-07-05 | Discharge: 2016-07-05 | Disposition: A | Discharge status: Home / Self Care | Payer: Medicare Other | Source: Ambulatory Visit | Attending: Internal Medicine | Admitting: Internal Medicine

## 2016-07-09 LAB — AEROBIC CULTURE W GRAM STAIN (SUPERFICIAL SPECIMEN)

## 2016-07-09 LAB — AEROBIC CULTURE  (SUPERFICIAL SPECIMEN)

## 2016-07-14 ENCOUNTER — Non-Acute Institutional Stay (SKILLED_NURSING_FACILITY): Payer: Medicare Other | Admitting: Gerontology

## 2016-07-14 DIAGNOSIS — K529 Noninfective gastroenteritis and colitis, unspecified: Secondary | ICD-10-CM

## 2016-07-14 DIAGNOSIS — T3695XA Adverse effect of unspecified systemic antibiotic, initial encounter: Principal | ICD-10-CM

## 2016-07-14 DIAGNOSIS — K521 Toxic gastroenteritis and colitis: Secondary | ICD-10-CM

## 2016-07-15 LAB — C DIFFICILE QUICK SCREEN W PCR REFLEX
C DIFFICILE (CDIFF) INTERP: NOT DETECTED
C DIFFICILE (CDIFF) TOXIN: NEGATIVE
C DIFFICLE (CDIFF) ANTIGEN: NEGATIVE

## 2016-07-17 ENCOUNTER — Other Ambulatory Visit: Admission: RE | Admit: 2016-07-17 | Payer: Medicare Other | Source: Ambulatory Visit | Admitting: *Deleted

## 2016-07-17 ENCOUNTER — Non-Acute Institutional Stay (SKILLED_NURSING_FACILITY): Payer: Medicare Other | Admitting: Gerontology

## 2016-07-17 DIAGNOSIS — Z1612 Extended spectrum beta lactamase (ESBL) resistance: Secondary | ICD-10-CM | POA: Diagnosis not present

## 2016-07-17 DIAGNOSIS — B3789 Other sites of candidiasis: Secondary | ICD-10-CM

## 2016-07-17 DIAGNOSIS — A499 Bacterial infection, unspecified: Secondary | ICD-10-CM | POA: Diagnosis not present

## 2016-07-18 ENCOUNTER — Inpatient Hospital Stay
Admission: EM | Admit: 2016-07-18 | Discharge: 2016-07-21 | DRG: 378 | Disposition: A | Discharge status: Skilled Nursing Facility | Payer: Medicare Other | Attending: Internal Medicine | Admitting: Internal Medicine

## 2016-07-18 ENCOUNTER — Emergency Department: Payer: Medicare Other

## 2016-07-18 DIAGNOSIS — F17210 Nicotine dependence, cigarettes, uncomplicated: Secondary | ICD-10-CM | POA: Diagnosis present

## 2016-07-18 DIAGNOSIS — Z23 Encounter for immunization: Secondary | ICD-10-CM

## 2016-07-18 DIAGNOSIS — E440 Moderate protein-calorie malnutrition: Secondary | ICD-10-CM | POA: Diagnosis present

## 2016-07-18 DIAGNOSIS — Z681 Body mass index (BMI) 19 or less, adult: Secondary | ICD-10-CM

## 2016-07-18 DIAGNOSIS — R1310 Dysphagia, unspecified: Secondary | ICD-10-CM | POA: Diagnosis present

## 2016-07-18 DIAGNOSIS — Z79899 Other long term (current) drug therapy: Secondary | ICD-10-CM

## 2016-07-18 DIAGNOSIS — Z96641 Presence of right artificial hip joint: Secondary | ICD-10-CM | POA: Diagnosis present

## 2016-07-18 DIAGNOSIS — R64 Cachexia: Secondary | ICD-10-CM | POA: Diagnosis present

## 2016-07-18 DIAGNOSIS — Z7401 Bed confinement status: Secondary | ICD-10-CM | POA: Diagnosis not present

## 2016-07-18 DIAGNOSIS — R2689 Other abnormalities of gait and mobility: Secondary | ICD-10-CM | POA: Diagnosis not present

## 2016-07-18 DIAGNOSIS — L8990 Pressure ulcer of unspecified site, unspecified stage: Secondary | ICD-10-CM | POA: Diagnosis present

## 2016-07-18 DIAGNOSIS — I10 Essential (primary) hypertension: Secondary | ICD-10-CM | POA: Diagnosis not present

## 2016-07-18 DIAGNOSIS — Z888 Allergy status to other drugs, medicaments and biological substances status: Secondary | ICD-10-CM

## 2016-07-18 DIAGNOSIS — D638 Anemia in other chronic diseases classified elsewhere: Secondary | ICD-10-CM | POA: Diagnosis present

## 2016-07-18 DIAGNOSIS — I214 Non-ST elevation (NSTEMI) myocardial infarction: Secondary | ICD-10-CM | POA: Diagnosis not present

## 2016-07-18 DIAGNOSIS — M25551 Pain in right hip: Secondary | ICD-10-CM

## 2016-07-18 DIAGNOSIS — Z9889 Other specified postprocedural states: Secondary | ICD-10-CM | POA: Diagnosis not present

## 2016-07-18 DIAGNOSIS — B369 Superficial mycosis, unspecified: Secondary | ICD-10-CM | POA: Diagnosis not present

## 2016-07-18 DIAGNOSIS — Z9221 Personal history of antineoplastic chemotherapy: Secondary | ICD-10-CM | POA: Diagnosis not present

## 2016-07-18 DIAGNOSIS — Z841 Family history of disorders of kidney and ureter: Secondary | ICD-10-CM | POA: Diagnosis not present

## 2016-07-18 DIAGNOSIS — R627 Adult failure to thrive: Secondary | ICD-10-CM | POA: Diagnosis not present

## 2016-07-18 DIAGNOSIS — Z85048 Personal history of other malignant neoplasm of rectum, rectosigmoid junction, and anus: Secondary | ICD-10-CM

## 2016-07-18 DIAGNOSIS — Z923 Personal history of irradiation: Secondary | ICD-10-CM

## 2016-07-18 DIAGNOSIS — R278 Other lack of coordination: Secondary | ICD-10-CM | POA: Diagnosis not present

## 2016-07-18 DIAGNOSIS — Z7982 Long term (current) use of aspirin: Secondary | ICD-10-CM

## 2016-07-18 DIAGNOSIS — R262 Difficulty in walking, not elsewhere classified: Secondary | ICD-10-CM | POA: Diagnosis not present

## 2016-07-18 DIAGNOSIS — Z803 Family history of malignant neoplasm of breast: Secondary | ICD-10-CM

## 2016-07-18 DIAGNOSIS — F329 Major depressive disorder, single episode, unspecified: Secondary | ICD-10-CM | POA: Diagnosis present

## 2016-07-18 DIAGNOSIS — Z885 Allergy status to narcotic agent status: Secondary | ICD-10-CM | POA: Diagnosis not present

## 2016-07-18 DIAGNOSIS — M6281 Muscle weakness (generalized): Secondary | ICD-10-CM | POA: Diagnosis not present

## 2016-07-18 DIAGNOSIS — R4182 Altered mental status, unspecified: Secondary | ICD-10-CM | POA: Diagnosis not present

## 2016-07-18 DIAGNOSIS — Z471 Aftercare following joint replacement surgery: Secondary | ICD-10-CM | POA: Diagnosis not present

## 2016-07-18 DIAGNOSIS — Z90710 Acquired absence of both cervix and uterus: Secondary | ICD-10-CM

## 2016-07-18 DIAGNOSIS — Z85819 Personal history of malignant neoplasm of unspecified site of lip, oral cavity, and pharynx: Secondary | ICD-10-CM

## 2016-07-18 DIAGNOSIS — S72001D Fracture of unspecified part of neck of right femur, subsequent encounter for closed fracture with routine healing: Secondary | ICD-10-CM | POA: Diagnosis not present

## 2016-07-18 DIAGNOSIS — K922 Gastrointestinal hemorrhage, unspecified: Secondary | ICD-10-CM | POA: Diagnosis not present

## 2016-07-18 DIAGNOSIS — L03311 Cellulitis of abdominal wall: Secondary | ICD-10-CM | POA: Diagnosis not present

## 2016-07-18 DIAGNOSIS — R41841 Cognitive communication deficit: Secondary | ICD-10-CM | POA: Diagnosis not present

## 2016-07-18 DIAGNOSIS — Z9181 History of falling: Secondary | ICD-10-CM | POA: Diagnosis not present

## 2016-07-18 DIAGNOSIS — Z66 Do not resuscitate: Secondary | ICD-10-CM | POA: Diagnosis present

## 2016-07-18 DIAGNOSIS — D62 Acute posthemorrhagic anemia: Secondary | ICD-10-CM | POA: Diagnosis not present

## 2016-07-18 DIAGNOSIS — R1312 Dysphagia, oropharyngeal phase: Secondary | ICD-10-CM | POA: Diagnosis not present

## 2016-07-18 DIAGNOSIS — Z5189 Encounter for other specified aftercare: Secondary | ICD-10-CM | POA: Diagnosis not present

## 2016-07-18 DIAGNOSIS — L539 Erythematous condition, unspecified: Secondary | ICD-10-CM | POA: Diagnosis present

## 2016-07-18 DIAGNOSIS — G8929 Other chronic pain: Secondary | ICD-10-CM | POA: Diagnosis present

## 2016-07-18 LAB — CBC WITH DIFFERENTIAL/PLATELET
BASOS ABS: 0 10*3/uL (ref 0–0.1)
Basophils Relative: 0 %
EOS PCT: 1 %
Eosinophils Absolute: 0 10*3/uL (ref 0–0.7)
HEMATOCRIT: 25.4 % — AB (ref 35.0–47.0)
HEMOGLOBIN: 8.8 g/dL — AB (ref 12.0–16.0)
LYMPHS ABS: 0.6 10*3/uL — AB (ref 1.0–3.6)
Lymphocytes Relative: 9 %
MCH: 32.1 pg (ref 26.0–34.0)
MCHC: 34.6 g/dL (ref 32.0–36.0)
MCV: 92.8 fL (ref 80.0–100.0)
Monocytes Absolute: 0.9 10*3/uL (ref 0.2–0.9)
Monocytes Relative: 14 %
NEUTROS ABS: 5 10*3/uL (ref 1.4–6.5)
NEUTROS PCT: 76 %
Platelets: 344 10*3/uL (ref 150–440)
RBC: 2.74 MIL/uL — AB (ref 3.80–5.20)
RDW: 14.7 % — ABNORMAL HIGH (ref 11.5–14.5)
WBC: 6.6 10*3/uL (ref 3.6–11.0)

## 2016-07-18 LAB — BASIC METABOLIC PANEL
ANION GAP: 4 — AB (ref 5–15)
BUN: 37 mg/dL — AB (ref 6–20)
CHLORIDE: 109 mmol/L (ref 101–111)
CO2: 25 mmol/L (ref 22–32)
Calcium: 10.2 mg/dL (ref 8.9–10.3)
Creatinine, Ser: 0.9 mg/dL (ref 0.44–1.00)
GFR calc Af Amer: >60 mL/min (ref >60)
GFR, EST NON AFRICAN AMERICAN: 59 mL/min — AB (ref >60)
GLUCOSE: 96 mg/dL (ref 65–99)
POTASSIUM: 4.3 mmol/L (ref 3.5–5.1)
Sodium: 138 mmol/L (ref 135–145)

## 2016-07-18 LAB — URINALYSIS COMPLETE WITH MICROSCOPIC (ARMC ONLY)
BILIRUBIN URINE: NEGATIVE
Bacteria, UA: NONE SEEN
Glucose, UA: NEGATIVE mg/dL
HGB URINE DIPSTICK: NEGATIVE
Ketones, ur: NEGATIVE mg/dL
LEUKOCYTES UA: NEGATIVE
Nitrite: NEGATIVE
PH: 6 (ref 5.0–8.0)
PROTEIN: NEGATIVE mg/dL
Specific Gravity, Urine: 1.014 (ref 1.005–1.030)

## 2016-07-18 LAB — TROPONIN I

## 2016-07-18 LAB — LACTIC ACID, PLASMA: Lactic Acid, Venous: 0.7 mmol/L (ref 0.5–1.9)

## 2016-07-18 MED ORDER — PANTOPRAZOLE SODIUM 40 MG IV SOLR
40.0000 mg | Freq: Two times a day (BID) | INTRAVENOUS | Status: DC
  Administered 2016-07-18 – 2016-07-19 (×2): 40 mg via INTRAVENOUS
  Filled 2016-07-18 (×3): qty 40

## 2016-07-18 MED ORDER — PENTAFLUOROPROP-TETRAFLUOROETH EX AERO
INHALATION_SPRAY | CUTANEOUS | Status: AC
  Administered 2016-07-18: 30
  Filled 2016-07-18: qty 30

## 2016-07-18 MED ORDER — CEPHALEXIN 500 MG PO CAPS
500.0000 mg | ORAL_CAPSULE | Freq: Once | ORAL | Status: DC
  Filled 2016-07-18: qty 1

## 2016-07-18 MED ORDER — SODIUM CHLORIDE 0.9 % IV BOLUS (SEPSIS)
1000.0000 mL | Freq: Once | INTRAVENOUS | Status: AC
  Administered 2016-07-18: 1000 mL via INTRAVENOUS

## 2016-07-18 NOTE — H&P (Addendum)
[place] @ [facility] Admission History and Physical Emily [name], D.O.  ---------------------------------------------------------------------------------------------------------------------   PATIENT NAME: Emily [name] MR#: [id number] DATE OF BIRTH: 07-08-1933 DATE OF ADMISSION: 07/18/2016 PRIMARY CARE PHYSICIAN: Lelon Huh, MD  REQUESTING/REFERRING PHYSICIAN: ED Dr. Alfred Levins  CHIEF COMPLAINT: Chief Complaint  Patient presents with  . Hip Pain    HISTORY OF PRESENT ILLNESS: Emily [name] is a 82 y.o. female with a known history of Throat and colon cancer, recent hip fracture status post replacement on 06/24/2016 who was brought to the emergency department by her son who is her healthcare proxy.  Her son is concerned about a recent decline while she has been at rehabilitation. He states he was discharged from this hospital on 06/27/16 following her hip replacement to [facility] where she has been doing rehabilitation. He states that she is losing weight, has decreased by mouth intake, is becoming increasingly lethargic, easily fatigued, is developing bedsores, is not participating in physical therapy. He reports that she has recently been treated with IV antibiotics (unsure whether it was Invanz or vancomycin) for cellulitis surrounding her PEG tube.  He states that a skin culture was done but not yet resulted. Patient denies pain, itching, irritation at the site.  Patient reports fatigue and weakness but denies symptoms of pain other than her chronic right hip pain since her surgery. She denies fevers, chills, chest pain, shortness of breath, dysuria, cough, abdominal pain, nausea, vomiting. She reports that her decreased by mouth intake is secondary to lack of appetite and not finding food palatable.   Of note patient's son has made clear in the emergency department that he is unsatisfied with the care his mother has been given at her skilled nursing facility  and would like for her to be placed elsewhere.   PAST MEDICAL HISTORY: Past Medical History:  Diagnosis Date  . Arthritis    fingers  . Cancer ([place])    rectal  . Cancer ([place]) 2012   colon cancer  . Cancer of contiguous sites of hypopharynx ([place]) 12/23/2015  . Colon cancer ([place])   . Difficult intubation   . Diverticulosis   . Hemorrhoids   . History of chicken pox   . Hypertension   . Osteopenia   . Status post chemotherapy    colon cancer  . Status post radiation therapy    colon cancer 2013      PAST SURGICAL HISTORY: Past Surgical History:  Procedure Laterality Date  . ABDOMINAL HYSTERECTOMY    . ABDOMINAL HYSTERECTOMY  1980   partial  . APPENDECTOMY    . APPENDECTOMY  [id number]   Dr. Pat Patrick  . BREAST CYST ASPIRATION Bilateral    negative  . BREAST SURGERY     cyst removal  . BREAST SURGERY  1960   Breast Biopsy  . CARDIAC CATHETERIZATION N/A 06/23/2016   Procedure: Left Heart Cath and Coronary Angiography;  Surgeon: Corey Skains, MD;  Location: [facility] LAB;  Service: Cardiovascular;  Laterality: N/A;  . COLON SURGERY  06/28/2013   resected tumor from colon; [facility]  . COLONOSCOPY    . COLONOSCOPY N/A 10/01/2015   Procedure: COLONOSCOPY;  Surgeon: [name], MD;  Location: [facility] ENDOSCOPY;  Service: Endoscopy;  Laterality: N/A;  . DILATION AND CURETTAGE OF UTERUS  1957  . DIRECT LARYNGOSCOPY N/A 12/11/2015   Procedure: DIRECT LARYNGOSCOPY;  Surgeon: Clyde Canterbury, MD;  Location: [facility] ORS;  Service: ENT;  Laterality: N/A;  . ESOPHAGOSCOPY  N/A 12/11/2015   Procedure: ESOPHAGOSCOPY;  Surgeon: Clyde Canterbury, MD;  Location: [facility] ORS;  Service: ENT;  Laterality: N/A;  . EUS N/A 01/20/2013   Procedure: LOWER ENDOSCOPIC ULTRASOUND (EUS);  Surgeon: Milus Banister, MD;  Location: [facility];  Service: Endoscopy;  Laterality: N/A;  . PEG PLACEMENT N/A 01/01/2016   Procedure: PERCUTANEOUS ENDOSCOPIC GASTROSTOMY (PEG) PLACEMENT;  Surgeon:  [name], MD;  Location: [facility] ORS;  Service: General;  Laterality: N/A;  . PORTACATH PLACEMENT Right 01/01/2016   Procedure: INSERTION PORT-A-CATH;  Surgeon: [name], MD;  Location: [facility] ORS;  Service: General;  Laterality: Right;  . TONSILLECTOMY    . TOTAL HIP ARTHROPLASTY Right 06/24/2016   Procedure: TOTAL HIP ARTHROPLASTY ANTERIOR APPROACH;  Surgeon: Hessie Knows, MD;  Location: [facility] ORS;  Service: Orthopedics;  Laterality: Right;      SOCIAL HISTORY: Social History  Substance Use Topics  . Smoking status: Current Every Day Smoker    Packs/day: 1.00    Years: 64.00    Types: Cigarettes  . Smokeless tobacco: Never Used  . Alcohol use 0.0 oz/week     Comment: rarely      FAMILY HISTORY: Family History  Problem Relation Age of Onset  . Kidney failure Father   . Breast cancer Maternal Grandmother      MEDICATIONS AT HOME: Prior to Admission medications   Medication Sig Start Date End Date Taking? Authorizing Provider  aspirin EC 81 MG EC tablet Take 1 tablet (81 mg total) by mouth daily. 06/26/16   Loletha Grayer, MD  docusate sodium (COLACE) 100 MG capsule Take 1 capsule (100 mg total) by mouth 2 (two) times daily. 06/26/16   Loletha Grayer, MD  enoxaparin (LOVENOX) 30 MG/0.3ML injection Inject 0.3 mLs (30 mg total) into the skin daily. 06/26/16   Loletha Grayer, MD  ferrous sulfate 325 (65 FE) MG tablet Take 1 tablet (325 mg total) by mouth 2 (two) times daily with a meal. 06/26/16   Loletha Grayer, MD  FLUoxetine (PROZAC) 10 MG tablet Take 1 tablet (10 mg total) by mouth daily. 06/02/16   [name], MD  HYDROcodone-acetaminophen (NORCO/VICODIN) 5-325 MG tablet Take 1 tablet by mouth every 6 (six) hours as needed (breakthrough pain). 06/26/16   Loletha Grayer, MD  lactulose (CHRONULAC) 10 GM/15ML solution Take 30 mLs (20 g total) by mouth 2 (two) times daily as needed for moderate constipation or severe constipation. 06/27/16   Dustin Flock, MD   megestrol (MEGACE) 40 MG tablet Take 1 tablet (40 mg total) by mouth 2 (two) times daily. 05/19/16   Lloyd Huger, MD  metoprolol succinate (TOPROL-XL) 25 MG 24 hr tablet Take 0.5 tablets (12.5 mg total) by mouth daily. 06/27/16   Loletha Grayer, MD  Nutritional Supplements (ENSURE PLUS PO) Take 1 Can by mouth daily.     Historical Provider, MD  nystatin (MYCOSTATIN) 100000 UNIT/ML suspension Take 5 mLs by mouth 4 (four) times daily. X 14 days. 06/19/16   Historical Provider, MD  ramipril (ALTACE) 2.5 MG capsule Take 1 capsule (2.5 mg total) by mouth at bedtime. 06/26/16   Loletha Grayer, MD      DRUG ALLERGIES: Allergies  Allergen Reactions  . Amlodipine Besylate Other (See Comments)    Patient unaware of any allergy with this medicine  . Oxycodone     confusion     REVIEW OF SYSTEMS: CONSTITUTIONAL: No fever/chills, weight gain/loss, headache. Positive for generalized weakness and fatigue. EYES: No blurry or double vision.  ENT: No tinnitus, postnasal drip, redness or soreness of the oropharynx. RESPIRATORY: No cough, wheeze, hemoptysis, dyspnea. CARDIOVASCULAR: No chest pain, orthopnea, palpitations, syncope. GASTROINTESTINAL: No nausea, vomiting, constipation, diarrhea, abdominal pain, hematemesis, melena or hematochezia. GENITOURINARY: No dysuria or hematuria. ENDOCRINE: No polyuria or nocturia. No heat or cold intolerance. HEMATOLOGY: No anemia, bruising, bleeding. INTEGUMENTARY: No rashes, ulcers, lesions. MUSCULOSKELETAL: No arthritis, swelling, gout. Positive for right hip pain NEUROLOGIC: No numbness, tingling, weakness or ataxia. No seizure-type activity. PSYCHIATRIC: No anxiety, depression, insomnia.  PHYSICAL EXAMINATION: VITAL SIGNS: Blood pressure (!) 168/57, pulse 92, temperature 98 F (36.7 C), temperature source Oral, resp. rate (!) 22, height 5\' 5"  (1.651 m), weight 50.8 kg (112 lb), SpO2 98 %.  GENERAL: 82 y.o.-year-old white female patient,  well-developed, well-nourished lying in the bed in no acute distress.  Pleasant and cooperative.   HEENT: Head atraumatic, normocephalic. Pupils equal, round, reactive to light and accommodation. No scleral icterus. Extraocular muscles intact. Nares are patent. Oropharynx is clear. Mucus membranes moist. NECK: Supple, full range of motion. No JVD, no bruit heard. No thyroid enlargement, no tenderness, no cervical lymphadenopathy. CHEST: Normal breath sounds bilaterally. No wheezing, rales, rhonchi or crackles. No use of accessory muscles of respiration.  No reproducible chest wall tenderness.  CARDIOVASCULAR: S1, S2 normal. No murmurs, rubs, or gallops. Cap refill <2 seconds. ABDOMEN: Soft, nontender, nondistended. No rebound, guarding, rigidity. Normoactive bowel sounds present in all four quadrants. No organomegaly or mass. G-tube is in place with circular erythema inferior to the tube. EXTREMITIES: Full range of motion. No pedal edema, cyanosis, or clubbing. NEUROLOGIC: Cranial nerves II through XII are grossly intact with no focal sensorimotor deficit. Muscle strength 5/5 in all extremities. Sensation intact. Gait not checked. PSYCHIATRIC: The patient is alert and oriented x 3. Normal affect, mood, thought content. SKIN: Warm, dry, and intact without obvious rash, lesion, or ulcer.  LABORATORY PANEL:  CBC  Recent Labs Lab 07/18/16 2211  WBC 6.6  HGB 8.8*  HCT 25.4*  PLT 344   ----------------------------------------------------------------------------------------------------------------- Chemistries  Recent Labs Lab 07/18/16 2211  NA 138  K 4.3  CL 109  CO2 25  GLUCOSE 96  BUN 37*  CREATININE 0.90  CALCIUM 10.2   ------------------------------------------------------------------------------------------------------------------ Cardiac Enzymes  Recent Labs Lab 07/18/16 2211  TROPONINI <0.03    ------------------------------------------------------------------------------------------------------------------ Stool guaiac is positive per emergency department physician.   RADIOLOGY: Dg Chest 2 View  Result Date: 07/18/2016 CLINICAL DATA:  Altered mental status.  Evaluate for pneumonia. EXAM: CHEST  2 VIEW COMPARISON:  Radiograph 06/21/2016 FINDINGS: Tip of the right chest port remains in the SVC. Unchanged cardiomediastinal contours with aortic atherosclerosis. No focal airspace disease to suggest pneumonia. The lungs remain hyperinflated. No pulmonary edema, pleural fluid or pneumothorax. Remote bilateral rib fractures are unchanged. IMPRESSION: No evidence of pneumonia.  No acute abnormality. Thoracic aortic atherosclerosis. Electronically Signed   By: Jeb Levering M.D.   On: 07/18/2016 22:59   Dg Hip Unilat With Pelvis 2-3 Views Right  Result Date: 07/18/2016 CLINICAL DATA:  Continued right hip pain after surgery. No recent injury. EXAM: DG HIP (WITH OR WITHOUT PELVIS) 2-3V RIGHT COMPARISON:  Postoperative radiograph 06/24/2016 FINDINGS: Right hip arthroplasty in expected alignment. No periprosthetic lucency. No periprosthetic fracture. The remainder the bony pelvis is intact. No additional or acute fracture. Pubic symphysis and sacroiliac joints are congruent. Vascular calcifications. A safety pin projecting over the a central pelvis is presumably external to the patient. Gastrostomy tube is incidentally noted in the upper abdomen.  IMPRESSION: Expected postoperative appearance of the right hip arthroplasty. No acute osseous abnormality. A safety pin projecting over the central pelvis is presumably external to the patient, recommend correlation for external artifact. Electronically Signed   By: Jeb Levering M.D.   On: 07/18/2016 22:57    EKG: Sinus at 82 bpm with normal axis, T-wave inversions in inferior leads laterally.  IMPRESSION AND PLAN:  This is a 82 y.o. female with a  history of throat and colon cancer with G-tube, left hip fracture status post hip replacement now being admitted with: 1. Lower GI bleed with anemia-we'll admit patient to inpatient service for IV Protonix, serial CBCs and transfusion as needed, nothing by mouth or by PEG, GI consultation for consideration of endoscopic evaluation. Her last colonoscopy was in November 2016 and was normal. Hold aspirin.  2. Failure to thrive, generalized weakness and fatigue secondary to #1-we'll provide gentle IV fluid hydration. We'll provide physical therapy consultation as well as nutrition consultation. Social work will be contacted regarding possibility of alternate skilled nursing facility placement. 3. Erythema of abdominal wall at PEG tube site-to me looks like either a fungal rash or contact sensitivity or dermatitis. The rash is slightly raised at the borders, perfectly circumferential as if it was in the place where previous adhesive was. There is no draining there is no bruising there is no irritation. Results from skin culture done on 9:15 show rare squamous epithelial cells predominantly mononuclear rare yeast. We will utilize topical antifungals, leave open to air and observe. Patient does not show any signs of sepsis and has recently been treated with intravenous antibiotics. We'll monitor for now and consider reculturing and or retreating if symptoms are worsening or not improving.  4. History of hypertension-continue metoprolol and ramipril and aspirin 5. History of depression-continue Prozac Continue vitamin D, B12, Colace, Megace.    Diet/Nutrition: nothing by mouth and nothing by PEG Fluids: IV normal saline DVT Px: SCDs and early ambulation. Chemoprophylaxis will be contraindicated at this point secondary to GI bleed.  Code Status: DO NOT RESUSCITATE-confirmed with the son Emily [name] who is patient's healthcare proxy.   All the records are reviewed and case discussed with ED provider. Management plans  discussed with the patient and/or family who express understanding and agree with plan of care.   TOTAL TIME TAKING CARE OF THIS PATIENT: 65 minutes.   Yuvan Medinger D.O. on 07/18/2016 at 11:27 PM Between 7am to 6pm - Pager - [phone] After 6pm go to [email or web address] - [employer] [facility] Office [phone] CC: Primary care physician; Lelon Huh, MD     Note: This dictation was prepared with Dragon dictation along with smaller phrase technology. Any transcriptional errors that result from this process are unintentional.

## 2016-07-18 NOTE — ED Notes (Signed)
Pt provided with po fluids per family request and additional warm blankets. Pt's son states 'why did you start iv fluids on her?" pt's son informed md stated he was concerned regarding dehydration and need for possible antibiotics. Asked son if he would like ivf stopped. Son states "no, the doctor never discussed iv fluids with me." dr. Alfred Levins notified who states she did discuss iv fluids with son who agreed at the time. Call bell remains at right side.

## 2016-07-18 NOTE — ED Notes (Signed)
Report to michelle, rn.

## 2016-07-18 NOTE — ED Notes (Signed)
Pt denies complaints other than chronic right hip pain since fracture. Pt states "it's like that, it sometimes is worse but no better usually." pt's son states he is concerned she is not receiving adequate hydration, he is concerned she is "lethargic". Pt does not appear lethargic at this time. Pt without skin tenting noted.

## 2016-07-18 NOTE — ED Notes (Signed)
Christine, rn in to access pt's port

## 2016-07-18 NOTE — ED Triage Notes (Signed)
Per pt's son who is pt's 36, son is "not happy" with [facility] where pt was residing for rehab. Pt's son states "she is in rehab she's supposed to be getting better not worse." pt's son states she needs placement in another facility.

## 2016-07-18 NOTE — Progress Notes (Signed)
Location:      Place of Service:  SNF (31) Provider:  Toni Arthurs, NP-C  Lelon Huh, MD  Patient Care Team: [name], MD as PCP - General (Family Medicine) [name], MD (General Surgery) [name], MD (Unknown Physician Specialty) [name], MD (Family Medicine) [name], MD (General Surgery)  Extended Emergency Contact Information Primary Emergency Contact: Bord,[name] Address: [address]          [place], [place] [address] Johnnette Litter of [place] Phone: [phone] Work Phone: [phone] Mobile Phone: [phone] Relation: Son Secondary Emergency Contact: [place] of [place] Phone: [phone] Relation: Daughter  Code Status:  Full Goals of care: Advanced Directive information Advanced Directives 05/19/2016  Does patient have an advance directive? Yes  Type of Paramedic of [place];Living will  Does patient want to make changes to advanced directive? -  Copy of advanced directive(s) in chart? No - copy requested  Would patient like information on creating an advanced directive? -  Some encounter information is confidential and restricted. Go to Review Flowsheets activity to see all data.     Chief Complaint  Patient presents with  . Acute Visit    HPI:  Pt is a 82 y.o. female seen today for an acute visit for Diarrhea. Patient has been on IV Invanz for 5 days for ESBL E coli in the PEG site. PEG site drainage is improving. Patient appears to be feeling better. Vital signs stable. However, patient has had several bouts of loose, foul-smelling stools. Patient denies abdominal pain. Overall, appears to be looking better. Vital signs stable.    Past Medical History:  Diagnosis Date  . Arthritis    fingers  . Cancer ([place])    rectal  . Cancer ([place]) 2012   colon cancer  . Cancer of contiguous sites of hypopharynx ([place]) 12/23/2015  . Colon cancer ([place])   . Difficult  intubation   . Diverticulosis   . Hemorrhoids   . History of chicken pox   . Hypertension   . Osteopenia   . Status post chemotherapy    colon cancer  . Status post radiation therapy    colon cancer 2013   Past Surgical History:  Procedure Laterality Date  . ABDOMINAL HYSTERECTOMY    . ABDOMINAL HYSTERECTOMY  1980   partial  . APPENDECTOMY    . APPENDECTOMY  [id number]   Dr. Pat Patrick  . BREAST CYST ASPIRATION Bilateral    negative  . BREAST SURGERY     cyst removal  . BREAST SURGERY  1960   Breast Biopsy  . CARDIAC CATHETERIZATION N/A 06/23/2016   Procedure: Left Heart Cath and Coronary Angiography;  Surgeon: Corey Skains, MD;  Location: [facility];  Service: Cardiovascular;  Laterality: N/A;  . COLON SURGERY  06/28/2013   resected tumor from colon; [facility]  . COLONOSCOPY    . COLONOSCOPY N/A 10/01/2015   Procedure: COLONOSCOPY;  Surgeon: [name], MD;  Location: [facility] ENDOSCOPY;  Service: Endoscopy;  Laterality: N/A;  . DILATION AND CURETTAGE OF UTERUS  1957  . DIRECT LARYNGOSCOPY N/A 12/11/2015   Procedure: DIRECT LARYNGOSCOPY;  Surgeon: Clyde Canterbury, MD;  Location: [facility] ORS;  Service: ENT;  Laterality: N/A;  . ESOPHAGOSCOPY N/A 12/11/2015   Procedure: ESOPHAGOSCOPY;  Surgeon: Clyde Canterbury, MD;  Location: [facility] ORS;  Service: ENT;  Laterality: N/A;  . EUS N/A 01/20/2013   Procedure: LOWER ENDOSCOPIC ULTRASOUND (EUS);  Surgeon: Milus Banister, MD;  Location: [facility];  Service: Endoscopy;  Laterality: N/A;  . PEG PLACEMENT N/A 01/01/2016   Procedure: PERCUTANEOUS ENDOSCOPIC GASTROSTOMY (PEG) PLACEMENT;  Surgeon: [name], MD;  Location: [facility] ORS;  Service: General;  Laterality: N/A;  . PORTACATH PLACEMENT Right 01/01/2016   Procedure: INSERTION PORT-A-CATH;  Surgeon: [name], MD;  Location: [facility] ORS;  Service: General;  Laterality: Right;  . TONSILLECTOMY    . TOTAL HIP ARTHROPLASTY Right 06/24/2016   Procedure: TOTAL HIP  ARTHROPLASTY ANTERIOR APPROACH;  Surgeon: Hessie Knows, MD;  Location: [facility] ORS;  Service: Orthopedics;  Laterality: Right;    Allergies  Allergen Reactions  . Amlodipine Besylate Other (See Comments)    Patient unaware of any allergy with this medicine  . Oxycodone     confusion      Medication List       Accurate as of 07/14/16 11:59 PM. Always use your most recent med list.          aspirin 81 MG EC tablet Take 1 tablet (81 mg total) by mouth daily.   docusate sodium 100 MG capsule Commonly known as:  COLACE Take 1 capsule (100 mg total) by mouth 2 (two) times daily.   enoxaparin 30 MG/0.3ML injection Commonly known as:  LOVENOX Inject 0.3 mLs (30 mg total) into the skin daily.   ENSURE PLUS PO Take 1 Can by mouth daily.   ferrous sulfate 325 (65 FE) MG tablet Take 1 tablet (325 mg total) by mouth 2 (two) times daily with a meal.   FLUoxetine 10 MG tablet Commonly known as:  PROZAC Take 1 tablet (10 mg total) by mouth daily.   HYDROcodone-acetaminophen 5-325 MG tablet Commonly known as:  NORCO/VICODIN Take 1 tablet by mouth every 6 (six) hours as needed (breakthrough pain).   lactulose 10 GM/15ML solution Commonly known as:  CHRONULAC Take 30 mLs (20 g total) by mouth 2 (two) times daily as needed for moderate constipation or severe constipation.   megestrol 40 MG tablet Commonly known as:  MEGACE Take 1 tablet (40 mg total) by mouth 2 (two) times daily.   metoprolol succinate 25 MG 24 hr tablet Commonly known as:  TOPROL-XL Take 0.5 tablets (12.5 mg total) by mouth daily.   nystatin 100000 UNIT/ML suspension Commonly known as:  MYCOSTATIN Take 5 mLs by mouth 4 (four) times daily. X 14 days.   ramipril 2.5 MG capsule Commonly known as:  ALTACE Take 1 capsule (2.5 mg total) by mouth at bedtime.       Review of Systems  Constitutional: Negative for activity change, appetite change, chills, diaphoresis and fever.  HENT: Negative for congestion,  sneezing, sore throat, trouble swallowing and voice change.   Eyes: Negative for pain, redness and visual disturbance.  Respiratory: Negative for apnea, cough, choking, chest tightness, shortness of breath and wheezing.   Cardiovascular: Negative for chest pain, palpitations and leg swelling.  Gastrointestinal: Positive for diarrhea. Negative for abdominal distention, abdominal pain, constipation and nausea.  Genitourinary: Negative for difficulty urinating, dysuria, frequency and urgency.  Musculoskeletal: Negative for back pain, gait problem and myalgias. Arthralgias: typical arthritis.  Skin: Positive for wound (PEG site). Negative for color change, pallor and rash.  Neurological: Negative for dizziness, tremors, syncope, speech difficulty, weakness, numbness and headaches.  Psychiatric/Behavioral: Negative for agitation and behavioral problems.  All other systems reviewed and are negative.   Immunization History  Administered Date(s) Administered  . Influenza,inj,Quad PF,36+ Mos 08/15/2015  .  Pneumococcal Polysaccharide-23 11/03/1999, 11/02/2005  . Zoster 04/10/2014   Pertinent  Health Maintenance Due  Topic Date Due  . PNA vac Low Risk Adult (2 of 2 - PCV13) 11/02/2006  . INFLUENZA VACCINE  06/02/2016  . COLONOSCOPY  09/30/2018  . DEXA SCAN  Completed   Fall Risk  04/15/2016 12/30/2015  Falls in the past year? No No   Functional Status Survey:    There were no vitals filed for this visit. There is no height or weight on file to calculate BMI. Physical Exam  Constitutional: She is oriented to person, place, and time. Vital signs are normal. She appears well-developed and well-nourished. She is active and cooperative. She does not appear ill. No distress.  HENT:  Head: Normocephalic and atraumatic.  Mouth/Throat: Uvula is midline, oropharynx is clear and moist and mucous membranes are normal. Mucous membranes are not pale, not dry and not cyanotic.  Eyes: Conjunctivae, EOM and  lids are normal. Pupils are equal, round, and reactive to light.  Neck: Trachea normal, normal range of motion and full passive range of motion without pain. Neck supple. No JVD present. No tracheal deviation, no edema and no erythema present. No thyromegaly present.  Cardiovascular: Normal rate, regular rhythm, normal heart sounds, intact distal pulses and normal pulses.  Exam reveals no gallop, no distant heart sounds and no friction rub.   No murmur heard. Pulmonary/Chest: Effort normal and breath sounds normal. No accessory muscle usage. No respiratory distress. She has no wheezes. She has no rales. She exhibits no tenderness.  Abdominal: Soft. Normal appearance and bowel sounds are normal. She exhibits no distension and no ascites. There is no tenderness.    G-tube with purulent drainage  Musculoskeletal: Normal range of motion. She exhibits no edema or tenderness.  Expected osteoarthritis, stiffness  Neurological: She is alert and oriented to person, place, and time. She has normal strength.  Skin: Skin is warm, dry and intact. She is not diaphoretic. No cyanosis. No pallor. Nails show no clubbing.  Psychiatric: She has a normal mood and affect. Her speech is normal and behavior is normal. Judgment and thought content normal. Cognition and memory are normal.  Nursing note and vitals reviewed.   Labs reviewed:  Recent Labs  06/25/16 0409 06/26/16 0419 06/27/16 0515  NA 138 138 138  K 3.7 2.9* 4.0  CL 111 114* 110  CO2 17* 19* 24  GLUCOSE 144* 108* 99  BUN 28* 31* 23*  CREATININE 1.32* 0.99 1.08*  CALCIUM 8.1* 7.6* 8.1*  MG 1.8 1.6* 1.9    Recent Labs  04/15/16 1336 05/19/16 1115 06/21/16 1607  AST 23 22 76*  ALT 12* 11* 38  ALKPHOS 66 70 61  BILITOT 0.6 0.6 1.1  PROT 7.2 6.7 7.0  ALBUMIN 4.1 3.8 3.8    Recent Labs  03/18/16 1512 04/15/16 1336 05/19/16 1115  06/26/16 0419 06/26/16 1610 06/27/16 0515  WBC 2.6* 7.2 5.3  < > 6.0 8.3 7.2  NEUTROABS 1.7 5.5 3.6   --   --   --   --   HGB 11.9* 13.1 13.1  < > 6.7* 10.7* 10.3*  HCT 35.9 38.2 38.5  < > 19.2* 31.0* 28.8*  MCV 94.6 93.6 93.9  < > 92.7 92.8 91.3  PLT 263 245 190  < > 113* 136* 153  < > = values in this interval not displayed. Lab Results  Component Value Date   TSH 5.193 (H) 07/02/2016   No results found for: HGBA1C  Lab Results  Component Value Date   CHOL 205 (H) 06/21/2016   HDL 60 06/21/2016   LDLCALC 120 (H) 06/21/2016   TRIG 123 06/21/2016   CHOLHDL 3.4 06/21/2016    Significant Diagnostic Results in last 30 days:  Dg Chest 1 View  Result Date: 06/21/2016 CLINICAL DATA:  Found down today. Right hip pain and dry mouth. Throat cancer. EXAM: CHEST 1 VIEW COMPARISON:  PET CT 05/14/2016.  Radiographs 01/01/2016. FINDINGS: 1702 hours. The heart size and mediastinal contours are stable. There is aortic atherosclerosis. Right subclavian Port-A-Cath tip extends to the SVC right atrial level. The lungs are clear. There is no pleural effusion or pneumothorax. No evidence of residual pneumoperitoneum. Stable old rib fractures or thoracotomy defects bilaterally. Percutaneous G-tube noted. IMPRESSION: No active cardiopulmonary process. Electronically Signed   By: Richardean Sale M.D.   On: 06/21/2016 17:24   Ct Head Wo Contrast  Result Date: 06/21/2016 CLINICAL DATA:  82 year old female found down. Pain and dehydration. Recently completed treatment for throat cancer. Initial encounter. EXAM: CT HEAD WITHOUT CONTRAST CT CERVICAL SPINE WITHOUT CONTRAST TECHNIQUE: Multidetector CT imaging of the head and cervical spine was performed following the standard protocol without intravenous contrast. Multiplanar CT image reconstructions of the cervical spine were also generated. COMPARISON:  PET-CT 05/14/2016 and earlier. FINDINGS: CT HEAD FINDINGS Small volume retained secretions in the nasopharynx. Visualized paranasal sinuses and mastoids are well pneumatized. Calvarium intact. Osteopenia. No acute  orbits soft tissue finding. No scalp hematoma identified. Extensive Calcified atherosclerosis at the skull base. Cerebral volume is within normal limits for age. Mild for age patchy bilateral nonspecific white matter hypodensity. No midline shift, ventriculomegaly, mass effect, evidence of mass lesion, intracranial hemorrhage or evidence of cortically based acute infarction. No suspicious intracranial vascular hyperdensity. CT CERVICAL SPINE FINDINGS Preserved cervical lordosis. Visualized skull base is intact. No atlanto-occipital dissociation. Cervicothoracic junction alignment is within normal limits. Bilateral posterior element alignment is within normal limits. Mild degenerative appearing spondylolisthesis at C5-C6 where there is chronic severe disc space loss and endplate spurring. No cervical spine fracture identified. Multilevel mild degenerative cervical spinal stenosis suspected. Visible upper thoracic levels appear intact. Negative lung apices aside from mild scarring. Calcified carotid atherosclerosis in the neck. Small volume retained secretions in the hypopharynx. Otherwise negative noncontrast neck soft tissues. IMPRESSION: 1. No acute intracranial abnormality. Mild for age cerebral white matter changes most commonly due to chronic small vessel disease. 2. No acute fracture or listhesis identified in the cervical spine. Ligamentous injury is not excluded. 3. Mild retained secretions in the pharynx. Electronically Signed   By: Genevie Ann M.D.   On: 06/21/2016 17:39   Ct Cervical Spine Wo Contrast  Result Date: 06/21/2016 CLINICAL DATA:  82 year old female found down. Pain and dehydration. Recently completed treatment for throat cancer. Initial encounter. EXAM: CT HEAD WITHOUT CONTRAST CT CERVICAL SPINE WITHOUT CONTRAST TECHNIQUE: Multidetector CT imaging of the head and cervical spine was performed following the standard protocol without intravenous contrast. Multiplanar CT image reconstructions of  the cervical spine were also generated. COMPARISON:  PET-CT 05/14/2016 and earlier. FINDINGS: CT HEAD FINDINGS Small volume retained secretions in the nasopharynx. Visualized paranasal sinuses and mastoids are well pneumatized. Calvarium intact. Osteopenia. No acute orbits soft tissue finding. No scalp hematoma identified. Extensive Calcified atherosclerosis at the skull base. Cerebral volume is within normal limits for age. Mild for age patchy bilateral nonspecific white matter hypodensity. No midline shift, ventriculomegaly, mass effect, evidence of mass lesion, intracranial hemorrhage or evidence  of cortically based acute infarction. No suspicious intracranial vascular hyperdensity. CT CERVICAL SPINE FINDINGS Preserved cervical lordosis. Visualized skull base is intact. No atlanto-occipital dissociation. Cervicothoracic junction alignment is within normal limits. Bilateral posterior element alignment is within normal limits. Mild degenerative appearing spondylolisthesis at C5-C6 where there is chronic severe disc space loss and endplate spurring. No cervical spine fracture identified. Multilevel mild degenerative cervical spinal stenosis suspected. Visible upper thoracic levels appear intact. Negative lung apices aside from mild scarring. Calcified carotid atherosclerosis in the neck. Small volume retained secretions in the hypopharynx. Otherwise negative noncontrast neck soft tissues. IMPRESSION: 1. No acute intracranial abnormality. Mild for age cerebral white matter changes most commonly due to chronic small vessel disease. 2. No acute fracture or listhesis identified in the cervical spine. Ligamentous injury is not excluded. 3. Mild retained secretions in the pharynx. Electronically Signed   By: Genevie Ann M.D.   On: 06/21/2016 17:39   Dg Hip Operative Unilat W Or W/o Pelvis Right  Result Date: 06/24/2016 CLINICAL DATA:  Right hip replacement EXAM: OPERATIVE RIGHT HIP WITH PELVIS COMPARISON:  None.  FLUOROSCOPY TIME:  Radiation Exposure Index (as provided by the fluoroscopic device): 2 mGy If the device does not provide the exposure index: Fluoroscopy Time:  12 seconds Number of Acquired Images:  2 FINDINGS: A right hip replacement is seen. No acute bony or soft tissue abnormality is noted. IMPRESSION: Status post right hip replacement Electronically Signed   By: Inez Catalina M.D.   On: 06/24/2016 13:18   Dg Hip Unilat W Or W/o Pelvis 2-3 Views Right  Result Date: 06/24/2016 CLINICAL DATA:  ORIF for right femoral neck fracture. EXAM: DG HIP (WITH OR WITHOUT PELVIS) 2-3V RIGHT COMPARISON:  06/21/2016. FINDINGS: Portable AP and cross-table lateral view of the right hip shows the patient be status post hip replacement. No evidence for immediate hardware complications. Skin staples are seen over the lateral hip. IMPRESSION: Status post right hip replacement for femoral neck fracture. No evidence for immediate hardware complications. Electronically Signed   By: Misty Stanley M.D.   On: 06/24/2016 13:55   Dg Hip Unilat W Or Wo Pelvis 2-3 Views Right  Result Date: 06/21/2016 CLINICAL DATA:  Pain after fall EXAM: DG HIP (WITH OR WITHOUT PELVIS) 2-3V RIGHT COMPARISON:  None. FINDINGS: There is a fracture through the right femoral neck. Laterally and superiorly, the fracture extends into the subcapital region. No dislocation identified. No other acute abnormalities. IMPRESSION: Fracture through the right femoral neck extending into the subcapital region superiorly and laterally. No dislocation. Electronically Signed   By: Dorise Bullion III M.D   On: 06/21/2016 17:18    Assessment/Plan 1. Antibiotic-associated diarrhea  Send stool for C. difficile  Encourage by mouth fluid intake  Continue normal saline at 30 ml an hour  Family/ staff Communication:   Total Time:  Documentation:  Face to Face:  Family/Phone:   Labs/tests ordered:  Send stool sample to evaluate for C.  difficile  Medication list reviewed and assessed for continued appropriateness.  Vikki Ports, NP-C Geriatrics [facility] [phone], [place] [address] Cell Phone (Mon-Fri 8am-5pm):  [phone] On Call:  [phone] & follow prompts after 5pm & weekends Office Phone:  [phone] Office Fax:  [phone]

## 2016-07-18 NOTE — Progress Notes (Signed)
Location:      Place of Service:  SNF (31) Provider:  Toni Arthurs, NP-C  Lelon Huh, MD  Patient Care Team: [name], MD as PCP - General (Family Medicine) [name], MD (General Surgery) [name], MD (Unknown Physician Specialty) [name], MD (Family Medicine) [name], MD (General Surgery)  Extended Emergency Contact Information Primary Emergency Contact: Vargus,[name] Address: [address]          [place], [place] [address] Johnnette Litter of [place] Phone: [phone] Work Phone: [phone] Mobile Phone: [phone] Relation: Son Secondary Emergency Contact: [place] of [place] Phone: [phone] Relation: Daughter  Code Status:  Full Goals of care: Advanced Directive information Advanced Directives 05/19/2016  Does patient have an advance directive? Yes  Type of Paramedic of [place];Living will  Does patient want to make changes to advanced directive? -  Copy of advanced directive(s) in chart? No - copy requested  Would patient like information on creating an advanced directive? -  Some encounter information is confidential and restricted. Go to Review Flowsheets activity to see all data.     Chief Complaint  Patient presents with  . Acute Visit    HPI:  Pt is a 82 y.o. female seen today for an acute visit for Evaluation of PEG site. Nursing reports redness and irritation under the PEG wafer. Patient denies itching or pain, says it just feels irritated on occasion. Site has had increased amount of drainage as of late due to ESBL E coli in the site. Antibiotics completed yesterday. Patient remains on contact precautions until negative culture results. Patient is looking well today, reports she is feeling good. Staff reports she has good days and bad days. Today appears to be a good day. No other complaints. Vital signs stable   Past Medical History:  Diagnosis Date  .  Arthritis    fingers  . Cancer ([place])    rectal  . Cancer ([place]) 2012   colon cancer  . Cancer of contiguous sites of hypopharynx ([place]) 12/23/2015  . Colon cancer ([place])   . Difficult intubation   . Diverticulosis   . Hemorrhoids   . History of chicken pox   . Hypertension   . Osteopenia   . Status post chemotherapy    colon cancer  . Status post radiation therapy    colon cancer 2013   Past Surgical History:  Procedure Laterality Date  . ABDOMINAL HYSTERECTOMY    . ABDOMINAL HYSTERECTOMY  1980   partial  . APPENDECTOMY    . APPENDECTOMY  [id number]   Dr. Pat Patrick  . BREAST CYST ASPIRATION Bilateral    negative  . BREAST SURGERY     cyst removal  . BREAST SURGERY  1960   Breast Biopsy  . CARDIAC CATHETERIZATION N/A 06/23/2016   Procedure: Left Heart Cath and Coronary Angiography;  Surgeon: Corey Skains, MD;  Location: [facility];  Service: Cardiovascular;  Laterality: N/A;  . COLON SURGERY  06/28/2013   resected tumor from colon; [facility]  . COLONOSCOPY    . COLONOSCOPY N/A 10/01/2015   Procedure: COLONOSCOPY;  Surgeon: [name], MD;  Location: [facility] ENDOSCOPY;  Service: Endoscopy;  Laterality: N/A;  . DILATION AND CURETTAGE OF UTERUS  1957  . DIRECT LARYNGOSCOPY N/A 12/11/2015   Procedure: DIRECT LARYNGOSCOPY;  Surgeon: Clyde Canterbury, MD;  Location: [facility] ORS;  Service: ENT;  Laterality: N/A;  . ESOPHAGOSCOPY N/A 12/11/2015  Procedure: ESOPHAGOSCOPY;  Surgeon: Clyde Canterbury, MD;  Location: [facility] ORS;  Service: ENT;  Laterality: N/A;  . EUS N/A 01/20/2013   Procedure: LOWER ENDOSCOPIC ULTRASOUND (EUS);  Surgeon: Milus Banister, MD;  Location: [facility];  Service: Endoscopy;  Laterality: N/A;  . PEG PLACEMENT N/A 01/01/2016   Procedure: PERCUTANEOUS ENDOSCOPIC GASTROSTOMY (PEG) PLACEMENT;  Surgeon: [name], MD;  Location: [facility] ORS;  Service: General;  Laterality: N/A;  . PORTACATH PLACEMENT Right 01/01/2016   Procedure: INSERTION  PORT-A-CATH;  Surgeon: [name], MD;  Location: [facility] ORS;  Service: General;  Laterality: Right;  . TONSILLECTOMY    . TOTAL HIP ARTHROPLASTY Right 06/24/2016   Procedure: TOTAL HIP ARTHROPLASTY ANTERIOR APPROACH;  Surgeon: Hessie Knows, MD;  Location: [facility] ORS;  Service: Orthopedics;  Laterality: Right;    Allergies  Allergen Reactions  . Amlodipine Besylate Other (See Comments)    Patient unaware of any allergy with this medicine  . Oxycodone     confusion      Medication List       Accurate as of 07/17/16 11:59 PM. Always use your most recent med list.          aspirin 81 MG EC tablet Take 1 tablet (81 mg total) by mouth daily.   docusate sodium 100 MG capsule Commonly known as:  COLACE Take 1 capsule (100 mg total) by mouth 2 (two) times daily.   enoxaparin 30 MG/0.3ML injection Commonly known as:  LOVENOX Inject 0.3 mLs (30 mg total) into the skin daily.   ENSURE PLUS PO Take 1 Can by mouth daily.   ferrous sulfate 325 (65 FE) MG tablet Take 1 tablet (325 mg total) by mouth 2 (two) times daily with a meal.   FLUoxetine 10 MG tablet Commonly known as:  PROZAC Take 1 tablet (10 mg total) by mouth daily.   HYDROcodone-acetaminophen 5-325 MG tablet Commonly known as:  NORCO/VICODIN Take 1 tablet by mouth every 6 (six) hours as needed (breakthrough pain).   lactulose 10 GM/15ML solution Commonly known as:  CHRONULAC Take 30 mLs (20 g total) by mouth 2 (two) times daily as needed for moderate constipation or severe constipation.   megestrol 40 MG tablet Commonly known as:  MEGACE Take 1 tablet (40 mg total) by mouth 2 (two) times daily.   metoprolol succinate 25 MG 24 hr tablet Commonly known as:  TOPROL-XL Take 0.5 tablets (12.5 mg total) by mouth daily.   nystatin 100000 UNIT/ML suspension Commonly known as:  MYCOSTATIN Take 5 mLs by mouth 4 (four) times daily. X 14 days.   ramipril 2.5 MG capsule Commonly known as:  ALTACE Take 1 capsule (2.5  mg total) by mouth at bedtime.       Review of Systems  Constitutional: Negative for activity change, appetite change, chills, diaphoresis and fever.  HENT: Negative for congestion, sneezing, sore throat, trouble swallowing and voice change.   Eyes: Negative for pain, redness and visual disturbance.  Respiratory: Negative for apnea, cough, choking, chest tightness, shortness of breath and wheezing.   Cardiovascular: Negative for chest pain, palpitations and leg swelling.  Gastrointestinal: Positive for diarrhea. Negative for abdominal distention, abdominal pain, constipation and nausea.  Genitourinary: Negative for difficulty urinating, dysuria, frequency and urgency.  Musculoskeletal: Negative for back pain, gait problem and myalgias. Arthralgias: typical arthritis.  Skin: Positive for wound (PEG site). Negative for color change, pallor and rash.  Neurological: Negative for dizziness, tremors, syncope, speech difficulty, weakness, numbness and headaches.  Psychiatric/Behavioral: Negative for agitation and behavioral problems.  All other systems reviewed and are negative.   Immunization History  Administered Date(s) Administered  . Influenza,inj,Quad PF,36+ Mos 08/15/2015  . Pneumococcal Polysaccharide-23 11/03/1999, 11/02/2005  . Zoster 04/10/2014   Pertinent  Health Maintenance Due  Topic Date Due  . PNA vac Low Risk Adult (2 of 2 - PCV13) 11/02/2006  . INFLUENZA VACCINE  06/02/2016  . COLONOSCOPY  09/30/2018  . DEXA SCAN  Completed   Fall Risk  04/15/2016 12/30/2015  Falls in the past year? No No   Functional Status Survey:    Vitals:   07/17/16 0500  BP: (!) 150/52  Pulse: 87  Resp: 20  Temp: 98.3 F (36.8 C)  SpO2: 97%   There is no height or weight on file to calculate BMI. Physical Exam  Constitutional: She is oriented to person, place, and time. Vital signs are normal. She appears well-developed and well-nourished. She is active and cooperative. She does not  appear ill. No distress.  HENT:  Head: Normocephalic and atraumatic.  Mouth/Throat: Uvula is midline, oropharynx is clear and moist and mucous membranes are normal. Mucous membranes are not pale, not dry and not cyanotic.  Eyes: Conjunctivae, EOM and lids are normal. Pupils are equal, round, and reactive to light.  Neck: Trachea normal, normal range of motion and full passive range of motion without pain. Neck supple. No JVD present. No tracheal deviation, no edema and no erythema present. No thyromegaly present.  Cardiovascular: Normal rate, regular rhythm, normal heart sounds, intact distal pulses and normal pulses.  Exam reveals no gallop, no distant heart sounds and no friction rub.   No murmur heard. Pulmonary/Chest: Effort normal and breath sounds normal. No accessory muscle usage. No respiratory distress. She has no wheezes. She has no rales. She exhibits no tenderness.  Abdominal: Soft. Normal appearance and bowel sounds are normal. She exhibits no distension and no ascites. There is no tenderness.    G-tube with purulent drainage  Musculoskeletal: Normal range of motion. She exhibits no edema or tenderness.  Expected osteoarthritis, stiffness  Neurological: She is alert and oriented to person, place, and time. She has normal strength.  Skin: Skin is warm, dry and intact. Rash noted. She is not diaphoretic. There is erythema. No cyanosis. No pallor. Nails show no clubbing.  Psychiatric: She has a normal mood and affect. Her speech is normal and behavior is normal. Judgment and thought content normal. Cognition and memory are normal.  Nursing note and vitals reviewed.   Labs reviewed:  Recent Labs  06/25/16 0409 06/26/16 0419 06/27/16 0515  NA 138 138 138  K 3.7 2.9* 4.0  CL 111 114* 110  CO2 17* 19* 24  GLUCOSE 144* 108* 99  BUN 28* 31* 23*  CREATININE 1.32* 0.99 1.08*  CALCIUM 8.1* 7.6* 8.1*  MG 1.8 1.6* 1.9    Recent Labs  04/15/16 1336 05/19/16 1115 06/21/16 1607   AST 23 22 76*  ALT 12* 11* 38  ALKPHOS 66 70 61  BILITOT 0.6 0.6 1.1  PROT 7.2 6.7 7.0  ALBUMIN 4.1 3.8 3.8    Recent Labs  03/18/16 1512 04/15/16 1336 05/19/16 1115  06/26/16 0419 06/26/16 1610 06/27/16 0515  WBC 2.6* 7.2 5.3  < > 6.0 8.3 7.2  NEUTROABS 1.7 5.5 3.6  --   --   --   --   HGB 11.9* 13.1 13.1  < > 6.7* 10.7* 10.3*  HCT 35.9 38.2 38.5  < >  19.2* 31.0* 28.8*  MCV 94.6 93.6 93.9  < > 92.7 92.8 91.3  PLT 263 245 190  < > 113* 136* 153  < > = values in this interval not displayed. Lab Results  Component Value Date   TSH 5.193 (H) 07/02/2016   No results found for: HGBA1C Lab Results  Component Value Date   CHOL 205 (H) 06/21/2016   HDL 60 06/21/2016   LDLCALC 120 (H) 06/21/2016   TRIG 123 06/21/2016   CHOLHDL 3.4 06/21/2016    Significant Diagnostic Results in last 30 days:  Dg Chest 1 View  Result Date: 06/21/2016 CLINICAL DATA:  Found down today. Right hip pain and dry mouth. Throat cancer. EXAM: CHEST 1 VIEW COMPARISON:  PET CT 05/14/2016.  Radiographs 01/01/2016. FINDINGS: 1702 hours. The heart size and mediastinal contours are stable. There is aortic atherosclerosis. Right subclavian Port-A-Cath tip extends to the SVC right atrial level. The lungs are clear. There is no pleural effusion or pneumothorax. No evidence of residual pneumoperitoneum. Stable old rib fractures or thoracotomy defects bilaterally. Percutaneous G-tube noted. IMPRESSION: No active cardiopulmonary process. Electronically Signed   By: Richardean Sale M.D.   On: 06/21/2016 17:24   Ct Head Wo Contrast  Result Date: 06/21/2016 CLINICAL DATA:  82 year old female found down. Pain and dehydration. Recently completed treatment for throat cancer. Initial encounter. EXAM: CT HEAD WITHOUT CONTRAST CT CERVICAL SPINE WITHOUT CONTRAST TECHNIQUE: Multidetector CT imaging of the head and cervical spine was performed following the standard protocol without intravenous contrast. Multiplanar CT image  reconstructions of the cervical spine were also generated. COMPARISON:  PET-CT 05/14/2016 and earlier. FINDINGS: CT HEAD FINDINGS Small volume retained secretions in the nasopharynx. Visualized paranasal sinuses and mastoids are well pneumatized. Calvarium intact. Osteopenia. No acute orbits soft tissue finding. No scalp hematoma identified. Extensive Calcified atherosclerosis at the skull base. Cerebral volume is within normal limits for age. Mild for age patchy bilateral nonspecific white matter hypodensity. No midline shift, ventriculomegaly, mass effect, evidence of mass lesion, intracranial hemorrhage or evidence of cortically based acute infarction. No suspicious intracranial vascular hyperdensity. CT CERVICAL SPINE FINDINGS Preserved cervical lordosis. Visualized skull base is intact. No atlanto-occipital dissociation. Cervicothoracic junction alignment is within normal limits. Bilateral posterior element alignment is within normal limits. Mild degenerative appearing spondylolisthesis at C5-C6 where there is chronic severe disc space loss and endplate spurring. No cervical spine fracture identified. Multilevel mild degenerative cervical spinal stenosis suspected. Visible upper thoracic levels appear intact. Negative lung apices aside from mild scarring. Calcified carotid atherosclerosis in the neck. Small volume retained secretions in the hypopharynx. Otherwise negative noncontrast neck soft tissues. IMPRESSION: 1. No acute intracranial abnormality. Mild for age cerebral white matter changes most commonly due to chronic small vessel disease. 2. No acute fracture or listhesis identified in the cervical spine. Ligamentous injury is not excluded. 3. Mild retained secretions in the pharynx. Electronically Signed   By: Genevie Ann M.D.   On: 06/21/2016 17:39   Ct Cervical Spine Wo Contrast  Result Date: 06/21/2016 CLINICAL DATA:  82 year old female found down. Pain and dehydration. Recently completed treatment for  throat cancer. Initial encounter. EXAM: CT HEAD WITHOUT CONTRAST CT CERVICAL SPINE WITHOUT CONTRAST TECHNIQUE: Multidetector CT imaging of the head and cervical spine was performed following the standard protocol without intravenous contrast. Multiplanar CT image reconstructions of the cervical spine were also generated. COMPARISON:  PET-CT 05/14/2016 and earlier. FINDINGS: CT HEAD FINDINGS Small volume retained secretions in the nasopharynx. Visualized paranasal sinuses and mastoids  are well pneumatized. Calvarium intact. Osteopenia. No acute orbits soft tissue finding. No scalp hematoma identified. Extensive Calcified atherosclerosis at the skull base. Cerebral volume is within normal limits for age. Mild for age patchy bilateral nonspecific white matter hypodensity. No midline shift, ventriculomegaly, mass effect, evidence of mass lesion, intracranial hemorrhage or evidence of cortically based acute infarction. No suspicious intracranial vascular hyperdensity. CT CERVICAL SPINE FINDINGS Preserved cervical lordosis. Visualized skull base is intact. No atlanto-occipital dissociation. Cervicothoracic junction alignment is within normal limits. Bilateral posterior element alignment is within normal limits. Mild degenerative appearing spondylolisthesis at C5-C6 where there is chronic severe disc space loss and endplate spurring. No cervical spine fracture identified. Multilevel mild degenerative cervical spinal stenosis suspected. Visible upper thoracic levels appear intact. Negative lung apices aside from mild scarring. Calcified carotid atherosclerosis in the neck. Small volume retained secretions in the hypopharynx. Otherwise negative noncontrast neck soft tissues. IMPRESSION: 1. No acute intracranial abnormality. Mild for age cerebral white matter changes most commonly due to chronic small vessel disease. 2. No acute fracture or listhesis identified in the cervical spine. Ligamentous injury is not excluded. 3.  Mild retained secretions in the pharynx. Electronically Signed   By: Genevie Ann M.D.   On: 06/21/2016 17:39   Dg Hip Operative Unilat W Or W/o Pelvis Right  Result Date: 06/24/2016 CLINICAL DATA:  Right hip replacement EXAM: OPERATIVE RIGHT HIP WITH PELVIS COMPARISON:  None. FLUOROSCOPY TIME:  Radiation Exposure Index (as provided by the fluoroscopic device): 2 mGy If the device does not provide the exposure index: Fluoroscopy Time:  12 seconds Number of Acquired Images:  2 FINDINGS: A right hip replacement is seen. No acute bony or soft tissue abnormality is noted. IMPRESSION: Status post right hip replacement Electronically Signed   By: Inez Catalina M.D.   On: 06/24/2016 13:18   Dg Hip Unilat W Or W/o Pelvis 2-3 Views Right  Result Date: 06/24/2016 CLINICAL DATA:  ORIF for right femoral neck fracture. EXAM: DG HIP (WITH OR WITHOUT PELVIS) 2-3V RIGHT COMPARISON:  06/21/2016. FINDINGS: Portable AP and cross-table lateral view of the right hip shows the patient be status post hip replacement. No evidence for immediate hardware complications. Skin staples are seen over the lateral hip. IMPRESSION: Status post right hip replacement for femoral neck fracture. No evidence for immediate hardware complications. Electronically Signed   By: Misty Stanley M.D.   On: 06/24/2016 13:55   Dg Hip Unilat W Or Wo Pelvis 2-3 Views Right  Result Date: 06/21/2016 CLINICAL DATA:  Pain after fall EXAM: DG HIP (WITH OR WITHOUT PELVIS) 2-3V RIGHT COMPARISON:  None. FINDINGS: There is a fracture through the right femoral neck. Laterally and superiorly, the fracture extends into the subcapital region. No dislocation identified. No other acute abnormalities. IMPRESSION: Fracture through the right femoral neck extending into the subcapital region superiorly and laterally. No dislocation. Electronically Signed   By: Dorise Bullion III M.D   On: 06/21/2016 17:18    Assessment/Plan 1. Other sites of candidiasis  Lotrisone thin  film to rash at PEG site twice a day until healed  Change gauze under PEG site twice a day and when necessary  2. ESBL producing bacteria infection  IV Invanz completed yesterday  Repeat wound culture at site of PEG insertion.  Patient is to remain on contact precautions until negative culture results  Family/ staff Communication:   Total Time:  Documentation:  Face to Face:  Family/Phone:   Labs/tests ordered:  Wound culture of PEG  site  Medication list reviewed and assessed for continued appropriateness.  Vikki Ports, NP-C Geriatrics [facility] [phone], [place] [address] Cell Phone (Mon-Fri 8am-5pm):  [phone] On Call:  [phone] & follow prompts after 5pm & weekends Office Phone:  [phone] Office Fax:  [phone]
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
## 2016-07-18 NOTE — ED Notes (Addendum)
Pt with duoderm padded dressing noted to right lateral ankle, old scab noted without redness to area. Pt with right posterior elbow padded duoderm dressing with scabbed abrasion. Pt with implanted port to right chest wall without s/s of infiltration of infection. Pt with g tube to left upper qudrant with excoriated skin noted around site approx 2 inches circumfrence, serous drainage noted from excoriated skin, no odor noted to site. Healed surgical incision noted to right anterior hip, no s/s of infection noted to site.

## 2016-07-18 NOTE — ED Notes (Signed)
Pt here with son and daughter in law; pt's son has already spent an extensive amount of time, prior to registering pt, speaking with charge nurse, [place] Nunnery and house supervisor, Lelon Frohlich; son says pt had a hip replacement here, performed by Dr Rudene Christians, and was recovering and [employer]; son has not been happy with her care and took her out of the facility tonight in hopes we could place here elsewhere; after speaking with house supervisor, son has just decided to check pt in to be evaluated by MD; registered with chief complaint of infection at G-tube

## 2016-07-19 LAB — BASIC METABOLIC PANEL
Anion gap: 4 — ABNORMAL LOW (ref 5–15)
BUN: 30 mg/dL — ABNORMAL HIGH (ref 6–20)
CHLORIDE: 115 mmol/L — AB (ref 101–111)
CO2: 22 mmol/L (ref 22–32)
CREATININE: 0.84 mg/dL (ref 0.44–1.00)
Calcium: 9.6 mg/dL (ref 8.9–10.3)
GFR calc non Af Amer: >60 mL/min (ref >60)
Glucose, Bld: 96 mg/dL (ref 65–99)
POTASSIUM: 3.8 mmol/L (ref 3.5–5.1)
SODIUM: 141 mmol/L (ref 135–145)

## 2016-07-19 LAB — CBC
HEMATOCRIT: 25 % — AB (ref 35.0–47.0)
Hemoglobin: 8.6 g/dL — ABNORMAL LOW (ref 12.0–16.0)
MCH: 32.3 pg (ref 26.0–34.0)
MCHC: 34.3 g/dL (ref 32.0–36.0)
MCV: 94.2 fL (ref 80.0–100.0)
PLATELETS: 312 10*3/uL (ref 150–440)
RBC: 2.65 MIL/uL — AB (ref 3.80–5.20)
RDW: 14.6 % — AB (ref 11.5–14.5)
WBC: 5.7 10*3/uL (ref 3.6–11.0)

## 2016-07-19 LAB — PHOSPHORUS: Phosphorus: 2.9 mg/dL (ref 2.5–4.6)

## 2016-07-19 LAB — MRSA PCR SCREENING: MRSA by PCR: NEGATIVE

## 2016-07-19 LAB — MAGNESIUM: MAGNESIUM: 1.8 mg/dL (ref 1.7–2.4)

## 2016-07-19 MED ORDER — NYSTATIN 100000 UNIT/GM EX CREA
TOPICAL_CREAM | Freq: Two times a day (BID) | CUTANEOUS | Status: DC
  Administered 2016-07-19 (×2): 1 via TOPICAL
  Administered 2016-07-20 – 2016-07-21 (×3): via TOPICAL
  Filled 2016-07-19: qty 15

## 2016-07-19 MED ORDER — MEGESTROL ACETATE 40 MG PO TABS
40.0000 mg | ORAL_TABLET | Freq: Two times a day (BID) | ORAL | Status: DC
  Administered 2016-07-19 – 2016-07-21 (×4): 40 mg via ORAL
  Filled 2016-07-19 (×6): qty 1

## 2016-07-19 MED ORDER — ONDANSETRON HCL 4 MG PO TABS: 4.0000 mg | ORAL_TABLET | Freq: Four times a day (QID) | ORAL | Status: DC | PRN

## 2016-07-19 MED ORDER — VITAMIN D 1000 UNITS PO TABS
2000.0000 [IU] | ORAL_TABLET | Freq: Every day | ORAL | Status: DC
  Administered 2016-07-20 – 2016-07-21 (×2): 2000 [IU] via ORAL
  Filled 2016-07-19 (×2): qty 2

## 2016-07-19 MED ORDER — BISACODYL 5 MG PO TBEC: 5.0000 mg | DELAYED_RELEASE_TABLET | Freq: Every day | ORAL | Status: DC | PRN

## 2016-07-19 MED ORDER — SENNOSIDES-DOCUSATE SODIUM 8.6-50 MG PO TABS: 1.0000 | ORAL_TABLET | Freq: Every evening | ORAL | Status: DC | PRN

## 2016-07-19 MED ORDER — RAMIPRIL 2.5 MG PO CAPS
2.5000 mg | ORAL_CAPSULE | Freq: Every day | ORAL | Status: DC
  Administered 2016-07-19 – 2016-07-20 (×2): 2.5 mg via ORAL
  Filled 2016-07-19 (×3): qty 1

## 2016-07-19 MED ORDER — INFLUENZA VAC SPLIT QUAD 0.5 ML IM SUSY
0.5000 mL | PREFILLED_SYRINGE | INTRAMUSCULAR | Status: AC
  Administered 2016-07-20: 0.5 mL via INTRAMUSCULAR
  Filled 2016-07-19: qty 0.5

## 2016-07-19 MED ORDER — SODIUM CHLORIDE 0.9% FLUSH: 3.0000 mL | Freq: Two times a day (BID) | INTRAVENOUS | Status: DC

## 2016-07-19 MED ORDER — ACETAMINOPHEN 650 MG RE SUPP: 650.0000 mg | Freq: Four times a day (QID) | RECTAL | Status: DC | PRN

## 2016-07-19 MED ORDER — DOCUSATE SODIUM 100 MG PO CAPS
100.0000 mg | ORAL_CAPSULE | Freq: Two times a day (BID) | ORAL | Status: DC
  Administered 2016-07-19 – 2016-07-20 (×3): 100 mg via ORAL
  Filled 2016-07-19 (×3): qty 1

## 2016-07-19 MED ORDER — ZOLPIDEM TARTRATE 5 MG PO TABS: 5.0000 mg | ORAL_TABLET | Freq: Every evening | ORAL | Status: DC | PRN

## 2016-07-19 MED ORDER — VITAMIN B-12 1000 MCG PO TABS
1000.0000 ug | ORAL_TABLET | Freq: Every day | ORAL | Status: DC
  Administered 2016-07-20 – 2016-07-21 (×2): 1000 ug via ORAL
  Filled 2016-07-19 (×2): qty 1

## 2016-07-19 MED ORDER — HYDROCODONE-ACETAMINOPHEN 5-325 MG PO TABS
1.0000 | ORAL_TABLET | ORAL | Status: DC | PRN
  Administered 2016-07-21: 1 via ORAL
  Filled 2016-07-19: qty 1

## 2016-07-19 MED ORDER — FLUOXETINE HCL 10 MG PO CAPS
10.0000 mg | ORAL_CAPSULE | Freq: Every day | ORAL | Status: DC
Start: 2016-07-19 — End: 2016-07-21
  Administered 2016-07-20 – 2016-07-21 (×2): 10 mg via ORAL
  Filled 2016-07-19 (×2): qty 1

## 2016-07-19 MED ORDER — ONDANSETRON HCL 4 MG/2ML IJ SOLN
4.0000 mg | Freq: Four times a day (QID) | INTRAMUSCULAR | Status: DC | PRN
Start: 2016-07-19 — End: 2016-07-21

## 2016-07-19 MED ORDER — MAGNESIUM CITRATE PO SOLN: 1.0000 | Freq: Once | ORAL | Status: DC | PRN

## 2016-07-19 MED ORDER — ACETAMINOPHEN 325 MG PO TABS
650.0000 mg | ORAL_TABLET | Freq: Four times a day (QID) | ORAL | Status: DC | PRN
Start: 2016-07-19 — End: 2016-07-21

## 2016-07-19 MED ORDER — METOPROLOL SUCCINATE ER 25 MG PO TB24
12.5000 mg | ORAL_TABLET | Freq: Every day | ORAL | Status: DC
  Administered 2016-07-20 – 2016-07-21 (×2): 12.5 mg via ORAL
  Filled 2016-07-19 (×2): qty 1

## 2016-07-19 MED ORDER — SODIUM CHLORIDE 0.9 % IV SOLN
INTRAVENOUS | Status: DC
  Administered 2016-07-19: 02:00:00 via INTRAVENOUS
  Administered 2016-07-19: 1000 mL via INTRAVENOUS
  Administered 2016-07-20 – 2016-07-21 (×3): via INTRAVENOUS
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
## 2016-07-19 NOTE — Progress Notes (Signed)
[place] [facility] [place] NAME: Emily [name]    MR#:  [id number]  DATE OF BIRTH:  September 15, 1933  SUBJECTIVE:   Patient just waking up this morning. No acute events overnight.  REVIEW OF SYSTEMS:    Review of Systems  Constitutional: Positive for malaise/fatigue and weight loss. Negative for chills and fever.  HENT: Negative.  Negative for ear discharge, ear pain, hearing loss, nosebleeds and sore throat.   Eyes: Negative.  Negative for blurred vision and pain.  Respiratory: Negative.  Negative for cough, hemoptysis, shortness of breath and wheezing.   Cardiovascular: Negative.  Negative for chest pain, palpitations and leg swelling.  Gastrointestinal: Negative.  Negative for abdominal pain, blood in stool, diarrhea, nausea and vomiting.  Genitourinary: Negative.  Negative for dysuria.  Musculoskeletal: Negative.  Negative for back pain.  Skin: Negative.   Neurological: Negative for dizziness, tremors, speech change, focal weakness, seizures and headaches.  Endo/Heme/Allergies: Negative.  Does not bruise/bleed easily.  Psychiatric/Behavioral: Positive for memory loss. Negative for depression, hallucinations and suicidal ideas.    Tolerating Diet: npo      DRUG ALLERGIES:   Allergies  Allergen Reactions  . Amlodipine Besylate Other (See Comments)    Patient unaware of any allergy with this medicine  . Oxycodone     confusion    VITALS:  Blood pressure (!) 145/50, pulse 80, temperature 98.4 F (36.9 C), temperature source Oral, resp. rate 17, height 5\' 5"  (1.651 m), weight 50.8 kg (112 lb), SpO2 95 %.  PHYSICAL EXAMINATION:   Physical Exam  Abdominal:  Peg site with erythema around PEG.      LABORATORY PANEL:   CBC  Recent Labs Lab 07/19/16 0514  WBC 5.7  HGB 8.6*  HCT 25.0*  PLT 312   ------------------------------------------------------------------------------------------------------------------  Chemistries    Recent Labs Lab 07/19/16 0514  NA 141  K 3.8  CL 115*  CO2 22  GLUCOSE 96  BUN 30*  CREATININE 0.84  CALCIUM 9.6  MG 1.8   ------------------------------------------------------------------------------------------------------------------  Cardiac Enzymes  Recent Labs Lab 07/18/16 2211  TROPONINI <0.03   ------------------------------------------------------------------------------------------------------------------  RADIOLOGY:  Dg Chest 2 View  Result Date: 07/18/2016 CLINICAL DATA:  Altered mental status.  Evaluate for pneumonia. EXAM: CHEST  2 VIEW COMPARISON:  Radiograph 06/21/2016 FINDINGS: Tip of the right chest port remains in the SVC. Unchanged cardiomediastinal contours with aortic atherosclerosis. No focal airspace disease to suggest pneumonia. The lungs remain hyperinflated. No pulmonary edema, pleural fluid or pneumothorax. Remote bilateral rib fractures are unchanged. IMPRESSION: No evidence of pneumonia.  No acute abnormality. Thoracic aortic atherosclerosis. Electronically Signed   By: Jeb Levering M.D.   On: 07/18/2016 22:59   Dg Hip Unilat With Pelvis 2-3 Views Right  Result Date: 07/18/2016 CLINICAL DATA:  Continued right hip pain after surgery. No recent injury. EXAM: DG HIP (WITH OR WITHOUT PELVIS) 2-3V RIGHT COMPARISON:  Postoperative radiograph 06/24/2016 FINDINGS: Right hip arthroplasty in expected alignment. No periprosthetic lucency. No periprosthetic fracture. The remainder the bony pelvis is intact. No additional or acute fracture. Pubic symphysis and sacroiliac joints are congruent. Vascular calcifications. A safety pin projecting over the a central pelvis is presumably external to the patient. Gastrostomy tube is incidentally noted in the upper abdomen. IMPRESSION: Expected postoperative appearance of the right hip arthroplasty. No acute osseous abnormality. A safety pin projecting over the central pelvis is presumably external to the patient,  recommend correlation for external artifact. Electronically Signed   By: Threasa Beards  Ehinger M.D.   On: 07/18/2016 22:57     ASSESSMENT AND PLAN:   82 y.o. female with a history of throat and colon cancer with G-tube, left hip fracture status post hip replacement now being admitted with:  1. Lower GI bleed with anemia-Continue IV Protonix,   GI consultation for consideration of endoscopic evaluation. Her last colonoscopy was in November 2016 and was normal. Hold aspirin.  Hemoglobin stable   2. Failure to thrive, generalized weakness and fatigue secondary to #1-we'll provide gentle IV fluid hydration.  PT and dietary consult Clinical social worker consult for disposition   3. Erythema of abdominal wall at PEG tube site-Patient does not show any signs of sepsis and has recently been treated with intravenous antibiotics. In the past culture was positive for Escherichia coli.  Most recent culture showed yeast. No antibiotics area Continue nystatin powder.  We'll monitor for now and consider reculturing and or retreating if symptoms are worsening or not improving.  4. History of hypertension-continue metoprolol and ramipril and aspirin 5. History of depression-continue Prozac Continue vitamin D, B12, Colace, Megace.       Management plans discussed with the patient and she is in agreement.  CODE STATUS: full  TOTAL TIME TAKING CARE OF THIS PATIENT: 30 minutes.     POSSIBLE D/C 2 days, DEPENDING ON CLINICAL CONDITION.   Markos Theil M.D on 07/19/2016 at 11:14 AM  Between 7am to 6pm - Pager - [phone] After 6pm go to [email or web address] - password [email or web address] [email or web address]  Office  [phone]  CC: Primary care physician; Lelon Huh, MD  Note: This dictation was prepared with Dragon dictation along with smaller phrase technology. Any transcriptional errors that result from this process are unintentional.

## 2016-07-19 NOTE — NC FL2 (Signed)
[place] LEVEL OF CARE SCREENING TOOL     IDENTIFICATION  Patient Name: Emily [name] Birthdate: 01-15-1933 Sex: female Admission Date (Current Location): 07/18/2016  [place] and [place] Number:  Engineering geologist and Address:  [facility], [address], [place], [place] [address]      Provider Number: [id number]  Attending Physician Name and Address:  Bettey Costa, MD  Relative Name and Phone Number:       Current Level of Care: Hospital Recommended Level of Care: [place] Prior Approval Number:    Date Approved/Denied: 06/24/16 PASRR Number: [id number]  Discharge Plan: SNF    Current Diagnoses: Patient Active Problem List   Diagnosis Date Noted  . Lower GI bleed 07/18/2016  . Pressure ulcer 06/23/2016  . NSTEMI (non-ST elevated myocardial infarction) ([place]) 06/21/2016  . Closed right hip fracture ([place]) 06/21/2016  . Ischemic chest pain ([place]) 06/21/2016  . Dysphagia 06/02/2016  . 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 Cancer of contiguous sites of hypopharynx ([place]) 12/23/2015  . Throat pain 11/27/2015  . History of colon cancer 05/09/2015  . Diverticulosis 05/09/2015  . Fatigue 05/09/2015  . Hemorrhoid 05/09/2015  . Hypertension 05/09/2015  . Insomnia 05/09/2015  . Osteopenia 05/09/2015  . Skin lesion of back 05/09/2015  . Tobacco abuse 05/09/2015  . Vitamin D deficiency 05/09/2015  . Ileostomy present ([place]) 09/27/2013  . Acid reflux 06/13/2013  . Arthritis, degenerative 06/13/2013  . Disuse syndrome 06/13/2013  . Malignant neoplasm of rectum ([place]) 01/31/2013  . Rectal cancer ([place]) 01/20/2013    Orientation RESPIRATION BLADDER Height & Weight     Self, Time, Situation  Normal Continent Weight: 112 lb (50.8 kg) Height:  5\' 5"  (165.1 cm)  BEHAVIORAL SYMPTOMS/MOOD NEUROLOGICAL BOWEL NUTRITION STATUS      Continent  (PEG)  AMBULATORY STATUS COMMUNICATION OF NEEDS Skin    Extensive Assist Verbally Normal                       Personal Care Assistance Level of Assistance  Bathing, Dressing, Total care Bathing Assistance: Limited assistance Feeding assistance: Independent Dressing Assistance: Maximum assistance Total Care Assistance: Maximum assistance   Functional Limitations Info    Sight Info: Adequate Hearing Info: Adequate Speech Info: Adequate    SPECIAL CARE FACTORS FREQUENCY  PT (By licensed PT)     PT Frequency: 5X day 5X week              Contractures Contractures Info: Present    Additional Factors Info  Allergies   Allergies Info: Amlodipine Besylate, Oxycodone           Current Medications (07/19/2016):  This is the current hospital active medication list Current Facility-Administered Medications  Medication Dose Route Frequency Provider Last Rate Last Dose  . 0.9 %  sodium chloride infusion   Intravenous Continuous Alexis Hugelmeyer, DO 75 mL/hr at 07/19/16 1342 1,000 mL at 07/19/16 1342  . acetaminophen (TYLENOL) tablet 650 mg  650 mg Oral Q6H PRN Alexis Hugelmeyer, DO       Or  . acetaminophen (TYLENOL) suppository 650 mg  650 mg Rectal Q6H PRN Alexis Hugelmeyer, DO      . bisacodyl (DULCOLAX) EC tablet 5 mg  5 mg Oral Daily PRN Alexis Hugelmeyer, DO      . cephALEXin (KEFLEX) capsule 500 mg  500 mg Oral Once Maine, MD      . cholecalciferol (VITAMIN D) tablet  2,000 Units  2,000 Units Oral Daily Alexis Hugelmeyer, DO      . docusate sodium (COLACE) capsule 100 mg  100 mg Oral BID Alexis Hugelmeyer, DO      . FLUoxetine (PROZAC) capsule 10 mg  10 mg Oral Daily Alexis Hugelmeyer, DO      . HYDROcodone-acetaminophen (NORCO/VICODIN) 5-325 MG per tablet 1-2 tablet  1-2 tablet Oral Q4H PRN Alexis Hugelmeyer, DO      . [START ON 07/20/2016] Influenza vac split quadrivalent PF (FLUARIX) injection 0.5 mL  0.5 mL Intramuscular Tomorrow-1000 Alexis Hugelmeyer, DO      . magnesium citrate solution 1 Bottle  1  Bottle Oral Once PRN Alexis Hugelmeyer, DO      . megestrol (MEGACE) tablet 40 mg  40 mg Oral BID Alexis Hugelmeyer, DO      . metoprolol succinate (TOPROL-XL) 24 hr tablet 12.5 mg  12.5 mg Oral Daily Alexis Hugelmeyer, DO      . nystatin cream (MYCOSTATIN)   Topical BID Alexis Hugelmeyer, DO   1 application at [id number]  . ondansetron (ZOFRAN) tablet 4 mg  4 mg Oral Q6H PRN Alexis Hugelmeyer, DO       Or  . ondansetron (ZOFRAN) injection 4 mg  4 mg Intravenous Q6H PRN Alexis Hugelmeyer, DO      . pantoprazole (PROTONIX) injection 40 mg  40 mg Intravenous BID Alexis Hugelmeyer, DO   40 mg at 07/18/16 2357  . ramipril (ALTACE) capsule 2.5 mg  2.5 mg Oral QHS Alexis Hugelmeyer, DO      . senna-docusate (Senokot-S) tablet 1 tablet  1 tablet Oral QHS PRN Alexis Hugelmeyer, DO      . sodium chloride flush (NS) 0.9 % injection 3 mL  3 mL Intravenous Q12H Alexis Hugelmeyer, DO      . vitamin B-12 (CYANOCOBALAMIN) tablet 1,000 mcg  1,000 mcg Oral Daily Alexis Hugelmeyer, DO      . zolpidem (AMBIEN) tablet 5 mg  5 mg Oral QHS PRN Alexis Hugelmeyer, DO       Facility-Administered Medications Ordered in Other Encounters  Medication Dose Route Frequency Provider Last Rate Last Dose  . 0.9 %  sodium chloride infusion   Intravenous Once Mayra Reel, NP         Discharge Medications: Please see discharge summary for a list of discharge medications.  Relevant Imaging Results:  Relevant Lab Results:   Additional Information  SS [id number]  Zettie Pho, LCSW

## 2016-07-19 NOTE — Evaluation (Signed)
Physical Therapy Evaluation Patient Details Name: Emily [name] MRN: [id number] DOB: [birth year] Today's Date: 07/19/2016   History of Present Illness  82 yo female who has come from SNF where she has been rehabilitating her hip fracture with THA, anterior approach with WBAT, and now is admitted with PEG tube with cellulitis and GI bleed.  Clinical Impression  Pt was seen for evaluation of her tolerance for mobility and up to chair, with pt requiring considerable cuing for confusion.  Her plan is to return to SNF of family choice as notes indicate family plans a change.  Follow acutely to get pt stronger and continue with gait, exercises and balance training with cues as needed for her cognition and safety needs.    Follow Up Recommendations SNF    Equipment Recommendations  None recommended by PT    Recommendations for Other Services Rehab consult     Precautions / Restrictions Precautions Precautions: Anterior Hip;Fall Precaution Booklet Issued: No Precaution Comments: Pt is DNR Restrictions Weight Bearing Restrictions: Yes RLE Weight Bearing: Weight bearing as tolerated      Mobility  Bed Mobility Overal bed mobility: Needs Assistance Bed Mobility: Supine to Sit     Supine to sit: Min assist;Mod assist     General bed mobility comments: pt needed cues to start but did offer help to lift trunk and finish sliding to EOB  Transfers Overall transfer level: Needs assistance Equipment used: Rolling walker (2 wheeled);1 person hand held assist Transfers: Sit to/from [facility] Sit to Stand: Min assist;Mod assist Stand pivot transfers: Min assist;Mod assist       General transfer comment: offered help to support trunk posteriorly to avoid listing backward  Ambulation/Gait             General Gait Details: pt could only sidestep to the chair with PT and not coordinated and alert enough to walk further  Stairs            Wheelchair  Mobility    Modified Rankin (Stroke Patients Only)       Balance Overall balance assessment: Needs assistance Sitting-balance support: Feet supported Sitting balance-Leahy Scale: Fair   Postural control: Posterior lean Standing balance support: Bilateral upper extremity supported Standing balance-Leahy Scale: Poor                               Pertinent Vitals/Pain Pain Assessment: Faces Faces Pain Scale: Hurts little more Pain Location: R hip with wbing Pain Intervention(s): Monitored during session;Premedicated before session    Home Living Family/patient expects to be discharged to:: Skilled nursing facility Living Arrangements: Alone                    Prior Function Level of Independence: Needs assistance   Gait / Transfers Assistance Needed: RW with assist in SNF  ADL's / Homemaking Assistance Needed: in SNF for help with all adl's and cooking, cleaning        Hand Dominance   Dominant Hand: Right    Extremity/Trunk Assessment   Upper Extremity Assessment: Generalized weakness           Lower Extremity Assessment: Generalized weakness      Cervical / Trunk Assessment: Kyphotic  Communication   Communication: Other (comment) (confused and answers questions oddly)  Cognition Arousal/Alertness: Awake/alert;Lethargic (woke up while working with PT) Behavior During Therapy: Flat affect Overall Cognitive Status: History of cognitive impairments - at baseline  Memory: Decreased recall of precautions;Decreased short-term memory              General Comments General comments (skin integrity, edema, etc.): Pt is up to chair with alarm, very likely to forget to ask for help and did elevate her legs and provide the call light in easy line of sight for pt safety    Exercises Total Joint Exercises Ankle Circles/Pumps: AROM;Both;5 reps Heel Slides: AROM;AAROM;Both;10 reps Hip ABduction/ADduction: AROM;AAROM;Both;10 reps    Assessment/Plan    PT Assessment Patient needs continued PT services  PT Problem List Decreased strength;Decreased range of motion;Decreased activity tolerance;Decreased balance;Decreased mobility;Decreased coordination;Decreased knowledge of use of DME;Decreased cognition;Decreased safety awareness;Decreased knowledge of precautions;Decreased skin integrity;Pain          PT Treatment Interventions DME instruction;Gait training;Functional mobility training;Therapeutic activities;Therapeutic exercise;Balance training;Neuromuscular re-education;Patient/family education    PT Goals (Current goals can be found in the Care Plan section)  Acute Rehab PT Goals Patient Stated Goal: "to get stronger again" PT Goal Formulation: With patient Time For Goal Achievement: 08/16/16 Potential to Achieve Goals: Good    Frequency Min 2X/week   Barriers to discharge Other (comment) (returning to SNF care hopefully)      Co-evaluation               End of Session Equipment Utilized During Treatment: Gait belt Activity Tolerance: Patient limited by fatigue;Patient limited by lethargy Patient left: in chair;with call bell/phone within reach;with chair alarm set Nurse Communication: Mobility status;Other (comment) (Pt has alarm and concerns for confusion)         Time: [id number] PT Time Calculation (min) (ACUTE ONLY): 23 min   Charges:   PT Evaluation $PT Eval Low Complexity: 1 Procedure PT Treatments $Therapeutic Activity: 8-22 mins   PT G Codes:        Ramond Dial 08-07-16, 1:07 PM    Mee Hives, PT MS Acute Rehab Dept. Number: [place] and [place]

## 2016-07-19 NOTE — Consult Note (Signed)
Consultation  Referring Provider:      Primary Care Physician:  Lelon Huh, MD Primary Gastroenterologist:         Reason for Consultation:     Anemia with heme positive      Impression / Plan:   1. Anemia secondary to possible GI bleed and anemia of chronic disease . Continue IV Protonix 40mg  q d. Given her last EGD and Colonoscopy were done by Dr. Bary Castilla, I would consult him in am for a EGD. Continue with iron supplementation and hold ASA.   2. PEG site at this time does not appear infected rather a contact dermatitis.            HPI:   Emily [name] is a 82 y.o. female S/P hip fracture replacement 06-24-16 brought from nursing home to ER by son because of progressive decline. On admission found to have heme positive brown stool and HGB of 8.8. At time of discharged 06-27-16 HGB was 10.3 after receiving blood transfusion following hip surgery. Not aware of receiving NSAIDS at nursing home.  Patient denies any abdominal pain. EGD and PEG tube placement done 01-2016 for failure to thrive. PEG tube site recently treated for possible infection with IV antibiotics.  Last colonoscopy 09-2015 showed polyps which were only biopsied.   PMHX significant for rectal cancer dx in 2013 and S/P ileo-sigmoid reanastomosis in 2014. Squamous cell carcinoma of hypopharynx dx in 2016. Past Medical History:  Diagnosis Date  . Arthritis    fingers  . Cancer ([place])    rectal  . Cancer ([place]) 2012   colon cancer  . Cancer of contiguous sites of hypopharynx ([place]) 12/23/2015  . Colon cancer ([place])   . Difficult intubation   . Diverticulosis   . Hemorrhoids   . History of chicken pox   . Hypertension   . Osteopenia   . Status post chemotherapy    colon cancer  . Status post radiation therapy    colon cancer 2013    Past Surgical History:  Procedure Laterality Date  . ABDOMINAL HYSTERECTOMY    . ABDOMINAL HYSTERECTOMY  1980   partial  . APPENDECTOMY    . APPENDECTOMY  [id number]   Dr. Pat Patrick  .  BREAST CYST ASPIRATION Bilateral    negative  . BREAST SURGERY     cyst removal  . BREAST SURGERY  1960   Breast Biopsy  . CARDIAC CATHETERIZATION N/A 06/23/2016   Procedure: Left Heart Cath and Coronary Angiography;  Surgeon: Corey Skains, MD;  Location: [facility];  Service: Cardiovascular;  Laterality: N/A;  . COLON SURGERY  06/28/2013   resected tumor from colon; [facility]  . COLONOSCOPY    . COLONOSCOPY N/A 10/01/2015   Procedure: COLONOSCOPY;  Surgeon: [name], MD;  Location: [facility] ENDOSCOPY;  Service: Endoscopy;  Laterality: N/A;  . DILATION AND CURETTAGE OF UTERUS  1957  . DIRECT LARYNGOSCOPY N/A 12/11/2015   Procedure: DIRECT LARYNGOSCOPY;  Surgeon: Clyde Canterbury, MD;  Location: [facility] ORS;  Service: ENT;  Laterality: N/A;  . ESOPHAGOSCOPY N/A 12/11/2015   Procedure: ESOPHAGOSCOPY;  Surgeon: Clyde Canterbury, MD;  Location: [facility] ORS;  Service: ENT;  Laterality: N/A;  . EUS N/A 01/20/2013   Procedure: LOWER ENDOSCOPIC ULTRASOUND (EUS);  Surgeon: Milus Banister, MD;  Location: [facility];  Service: Endoscopy;  Laterality: N/A;  . PEG PLACEMENT N/A 01/01/2016   Procedure: PERCUTANEOUS ENDOSCOPIC GASTROSTOMY (PEG) PLACEMENT;  Surgeon: [name], MD;  Location:  [facility] ORS;  Service: General;  Laterality: N/A;  . PORTACATH PLACEMENT Right 01/01/2016   Procedure: INSERTION PORT-A-CATH;  Surgeon: [name], MD;  Location: [facility] ORS;  Service: General;  Laterality: Right;  . TONSILLECTOMY    . TOTAL HIP ARTHROPLASTY Right 06/24/2016   Procedure: TOTAL HIP ARTHROPLASTY ANTERIOR APPROACH;  Surgeon: Hessie Knows, MD;  Location: [facility] ORS;  Service: Orthopedics;  Laterality: Right;    Family History  Problem Relation Age of Onset  . Kidney failure Father   . Breast cancer Maternal Grandmother       Social History  Substance Use Topics  . Smoking status: Current Every Day Smoker    Packs/day: 1.00    Years: 64.00    Types: Cigarettes  .  Smokeless tobacco: Never Used  . Alcohol use 0.0 oz/week     Comment: rarely    Prior to Admission medications   Medication Sig Start Date End Date Taking? Authorizing Provider  aspirin EC 81 MG EC tablet Take 1 tablet (81 mg total) by mouth daily. 06/26/16  Yes Loletha Grayer, MD  Cholecalciferol (VITAMIN D-3) 1000 units CAPS Take 2,000 Units by mouth daily.   Yes Historical Provider, MD  cyanocobalamin 1000 MCG tablet Take 1,000 mcg by mouth daily.   Yes Historical Provider, MD  docusate sodium (COLACE) 100 MG capsule Take 1 capsule (100 mg total) by mouth 2 (two) times daily. 06/26/16  Yes Loletha Grayer, MD  FLUoxetine (PROZAC) 10 MG tablet Take 1 tablet (10 mg total) by mouth daily. 06/02/16  Yes [name], MD  guaiFENesin (ROBITUSSIN) 100 MG/5ML SOLN Take 10 mLs by mouth every 4 (four) hours as needed for cough or to loosen phlegm.   Yes Historical Provider, MD  HYDROcodone-acetaminophen (NORCO/VICODIN) 5-325 MG tablet Take 1 tablet by mouth every 6 (six) hours as needed (breakthrough pain). 06/26/16  Yes Loletha Grayer, MD  lactulose (CHRONULAC) 10 GM/15ML solution Take 30 mLs (20 g total) by mouth 2 (two) times daily as needed for moderate constipation or severe constipation. 06/27/16  Yes Dustin Flock, MD  megestrol (MEGACE) 40 MG tablet Take 1 tablet (40 mg total) by mouth 2 (two) times daily. 05/19/16  Yes Lloyd Huger, MD  metoprolol succinate (TOPROL-XL) 25 MG 24 hr tablet Take 0.5 tablets (12.5 mg total) by mouth daily. 06/27/16  Yes Loletha Grayer, MD  Nutritional Supplements (ENSURE PLUS PO) Take 1 Can by mouth daily.    Yes Historical Provider, MD  ramipril (ALTACE) 2.5 MG capsule Take 1 capsule (2.5 mg total) by mouth at bedtime. 06/26/16  Yes Loletha Grayer, MD  nystatin (MYCOSTATIN) 100000 UNIT/ML suspension Take 5 mLs by mouth 4 (four) times daily. X 14 days. 06/19/16   Historical Provider, MD    Current Facility-Administered Medications  Medication Dose Route  Frequency Provider Last Rate Last Dose  . 0.9 %  sodium chloride infusion   Intravenous Continuous Alexis Hugelmeyer, DO 75 mL/hr at 07/19/16 0156    . acetaminophen (TYLENOL) tablet 650 mg  650 mg Oral Q6H PRN Alexis Hugelmeyer, DO       Or  . acetaminophen (TYLENOL) suppository 650 mg  650 mg Rectal Q6H PRN Alexis Hugelmeyer, DO      . bisacodyl (DULCOLAX) EC tablet 5 mg  5 mg Oral Daily PRN Alexis Hugelmeyer, DO      . cephALEXin (KEFLEX) capsule 500 mg  500 mg Oral Once Maine, MD      . cholecalciferol (VITAMIN D) tablet 2,000 Units  2,000 Units Oral Daily Alexis Hugelmeyer, DO      . docusate sodium (COLACE) capsule 100 mg  100 mg Oral BID Alexis Hugelmeyer, DO      . FLUoxetine (PROZAC) capsule 10 mg  10 mg Oral Daily Alexis Hugelmeyer, DO      . HYDROcodone-acetaminophen (NORCO/VICODIN) 5-325 MG per tablet 1-2 tablet  1-2 tablet Oral Q4H PRN Alexis Hugelmeyer, DO      . [START ON 07/20/2016] Influenza vac split quadrivalent PF (FLUARIX) injection 0.5 mL  0.5 mL Intramuscular Tomorrow-1000 Alexis Hugelmeyer, DO      . magnesium citrate solution 1 Bottle  1 Bottle Oral Once PRN Alexis Hugelmeyer, DO      . megestrol (MEGACE) tablet 40 mg  40 mg Oral BID Alexis Hugelmeyer, DO      . metoprolol succinate (TOPROL-XL) 24 hr tablet 12.5 mg  12.5 mg Oral Daily Alexis Hugelmeyer, DO      . nystatin cream (MYCOSTATIN)   Topical BID Alexis Hugelmeyer, DO   1 application at [id number]  . ondansetron (ZOFRAN) tablet 4 mg  4 mg Oral Q6H PRN Alexis Hugelmeyer, DO       Or  . ondansetron (ZOFRAN) injection 4 mg  4 mg Intravenous Q6H PRN Alexis Hugelmeyer, DO      . pantoprazole (PROTONIX) injection 40 mg  40 mg Intravenous BID Alexis Hugelmeyer, DO   40 mg at 07/18/16 2357  . ramipril (ALTACE) capsule 2.5 mg  2.5 mg Oral QHS Alexis Hugelmeyer, DO      . senna-docusate (Senokot-S) tablet 1 tablet  1 tablet Oral QHS PRN Alexis Hugelmeyer, DO      . sodium chloride flush (NS) 0.9 % injection  3 mL  3 mL Intravenous Q12H Alexis Hugelmeyer, DO      . vitamin B-12 (CYANOCOBALAMIN) tablet 1,000 mcg  1,000 mcg Oral Daily Alexis Hugelmeyer, DO      . zolpidem (AMBIEN) tablet 5 mg  5 mg Oral QHS PRN Alexis Hugelmeyer, DO       Facility-Administered Medications Ordered in Other Encounters  Medication Dose Route Frequency Provider Last Rate Last Dose  . 0.9 %  sodium chloride infusion   Intravenous Once Mayra Reel, NP        Allergies as of 07/18/2016 - Review Complete 07/18/2016  Allergen Reaction Noted  . Amlodipine besylate Other (See Comments) 05/13/2015  . Oxycodone  05/09/2015     Review of Systems:    This is positive for those things mentioned in the HPI, also positive for  . All other review of systems are negative.       Physical Exam:  Vital signs in last 24 hours: Temp:  [98 F (36.7 C)-98.4 F (36.9 C)] 98.4 F (36.9 C) (09/17 0514) Pulse Rate:  [78-92] 80 (09/17 0514) Resp:  [16-22] 17 (09/17 0514) BP: (134-169)/(50-65) 145/50 (09/17 0514) SpO2:  [95 %-100 %] 95 % (09/17 0514) Weight:  [50.8 kg (112 lb)] 50.8 kg (112 lb) (09/16 2122) Last BM Date: 07/18/16  General:  Well-developed, well-nourished and in no acute distress Eyes:  anicteric. ENT:   Mouth and posterior pharynx free of lesions.  Neck:   supple w/o thyromegaly or mass.  Lungs: Clear to auscultation bilaterally. Heart:  S1S2, no rubs, murmurs, gallops. Abdomen:  soft, non-tender, no hepatosplenomegaly, hernia, or mass and BS+. Surrounding PEG site erythema without pain on palpation and no drainage.  Rectal: Lymph:  no cervical or supraclavicular adenopathy. Extremities:   no edema Skin  no rash. Neuro:  A&O x 3.  Psych:  appropriate mood and  Affect.   Data Reviewed:   LAB RESULTS:  Recent Labs  07/18/16 2211 07/19/16 0514  WBC 6.6 5.7  HGB 8.8* 8.6*  HCT 25.4* 25.0*  PLT 344 312   BMET  Recent Labs  07/18/16 2211 07/19/16 0514  NA 138 141  K 4.3 3.8  CL 109  115*  CO2 25 22  GLUCOSE 96 96  BUN 37* 30*  CREATININE 0.90 0.84  CALCIUM 10.2 9.6   LFT No results for input(s): PROT, ALBUMIN, AST, ALT, ALKPHOS, BILITOT, BILIDIR, IBILI in the last 72 hours. PT/INR No results for input(s): LABPROT, INR in the last 72 hours.  STUDIES: Dg Chest 2 View  Result Date: 07/18/2016 CLINICAL DATA:  Altered mental status.  Evaluate for pneumonia. EXAM: CHEST  2 VIEW COMPARISON:  Radiograph 06/21/2016 FINDINGS: Tip of the right chest port remains in the SVC. Unchanged cardiomediastinal contours with aortic atherosclerosis. No focal airspace disease to suggest pneumonia. The lungs remain hyperinflated. No pulmonary edema, pleural fluid or pneumothorax. Remote bilateral rib fractures are unchanged. IMPRESSION: No evidence of pneumonia.  No acute abnormality. Thoracic aortic atherosclerosis. Electronically Signed   By: Jeb Levering M.D.   On: 07/18/2016 22:59   Dg Hip Unilat With Pelvis 2-3 Views Right  Result Date: 07/18/2016 CLINICAL DATA:  Continued right hip pain after surgery. No recent injury. EXAM: DG HIP (WITH OR WITHOUT PELVIS) 2-3V RIGHT COMPARISON:  Postoperative radiograph 06/24/2016 FINDINGS: Right hip arthroplasty in expected alignment. No periprosthetic lucency. No periprosthetic fracture. The remainder the bony pelvis is intact. No additional or acute fracture. Pubic symphysis and sacroiliac joints are congruent. Vascular calcifications. A safety pin projecting over the a central pelvis is presumably external to the patient. Gastrostomy tube is incidentally noted in the upper abdomen. IMPRESSION: Expected postoperative appearance of the right hip arthroplasty. No acute osseous abnormality. A safety pin projecting over the central pelvis is presumably external to the patient, recommend correlation for external artifact. Electronically Signed   By: Jeb Levering M.D.   On: 07/18/2016 22:57     PREVIOUS ENDOSCOPIES: EGD:01-2016    Colonoscopy :  09-2015           Thanks   LOS: 1 day   San Jetty MD @  07/19/2016, 11:14 AM

## 2016-07-20 LAB — AEROBIC CULTURE  (SUPERFICIAL SPECIMEN)

## 2016-07-20 LAB — URINE CULTURE: Culture: NO GROWTH

## 2016-07-20 LAB — CBC
HCT: 25.5 % — ABNORMAL LOW (ref 35.0–47.0)
Hemoglobin: 8.7 g/dL — ABNORMAL LOW (ref 12.0–16.0)
MCH: 32.3 pg (ref 26.0–34.0)
MCHC: 34.2 g/dL (ref 32.0–36.0)
MCV: 94.3 fL (ref 80.0–100.0)
PLATELETS: 289 10*3/uL (ref 150–440)
RBC: 2.71 MIL/uL — ABNORMAL LOW (ref 3.80–5.20)
RDW: 14.6 % — AB (ref 11.5–14.5)
WBC: 6.1 10*3/uL (ref 3.6–11.0)

## 2016-07-20 LAB — AEROBIC CULTURE W GRAM STAIN (SUPERFICIAL SPECIMEN)

## 2016-07-20 LAB — ALBUMIN: Albumin: 2.6 g/dL — ABNORMAL LOW (ref 3.5–5.0)

## 2016-07-20 MED ORDER — JEVITY 1.5 CAL/FIBER PO LIQD
1000.0000 mL | ORAL | Status: DC
  Administered 2016-07-20: 1000 mL

## 2016-07-20 MED ORDER — FREE WATER
200.0000 mL | Freq: Two times a day (BID) | Status: DC
  Administered 2016-07-20 – 2016-07-21 (×2): 200 mL

## 2016-07-20 MED ORDER — FREE WATER: 200.0000 mL | Freq: Three times a day (TID) | Status: DC

## 2016-07-20 MED ORDER — PANTOPRAZOLE SODIUM 40 MG PO TBEC
40.0000 mg | DELAYED_RELEASE_TABLET | Freq: Every day | ORAL | Status: DC
  Administered 2016-07-20 – 2016-07-21 (×2): 40 mg via ORAL
  Filled 2016-07-20 (×2): qty 1

## 2016-07-20 NOTE — Progress Notes (Signed)
[facility] [place] NAME: Emily [name]    MR#:  [id number]  DATE OF BIRTH:  01-Jul-1933  SUBJECTIVE:  Patient Says she sometimes has difficulty swallowing.   REVIEW OF SYSTEMS:    Review of Systems  Constitutional: Positive for malaise/fatigue and weight loss. Negative for chills and fever.  HENT: Negative.  Negative for ear discharge, ear pain, hearing loss, nosebleeds and sore throat.   Eyes: Negative.  Negative for blurred vision and pain.  Respiratory: Negative.  Negative for cough, hemoptysis, shortness of breath and wheezing.   Cardiovascular: Negative.  Negative for chest pain, palpitations and leg swelling.  Gastrointestinal: Negative.  Negative for abdominal pain, blood in stool, diarrhea, nausea and vomiting.  Genitourinary: Negative.  Negative for dysuria.  Musculoskeletal: Negative.  Negative for back pain.  Skin: Negative.   Neurological: Negative for dizziness, tremors, speech change, focal weakness, seizures and headaches.  Endo/Heme/Allergies: Negative.  Does not bruise/bleed easily.  Psychiatric/Behavioral: Positive for memory loss. Negative for depression, hallucinations and suicidal ideas.    Tolerating Diet: npo      DRUG ALLERGIES:   Allergies  Allergen Reactions  . Amlodipine Besylate Other (See Comments)    Patient unaware of any allergy with this medicine  . Oxycodone     confusion    VITALS:  Blood pressure (!) 129/52, pulse 86, temperature 98.9 F (37.2 C), temperature source Oral, resp. rate 20, height 5\' 5"  (1.651 m), weight 50.8 kg (112 lb), SpO2 97 %.  PHYSICAL EXAMINATION:   Physical Exam  Constitutional: She is well-developed, well-nourished, and in no distress. No distress.  HENT:  Head: Normocephalic.  Eyes: No scleral icterus.  Neck: Normal range of motion. Neck supple. No JVD present. No tracheal deviation present.  Cardiovascular: Normal rate, regular rhythm and normal heart  sounds.  Exam reveals no gallop and no friction rub.   No murmur heard. Pulmonary/Chest: Effort normal and breath sounds normal. No respiratory distress. She has no wheezes. She has no rales. She exhibits no tenderness.  Abdominal: Soft. Bowel sounds are normal. She exhibits no distension and no mass. There is no tenderness. There is no rebound and no guarding.  Peg site with erythema around PEG.  Musculoskeletal: Normal range of motion. She exhibits no edema.  Neurological: She is alert.  Skin: Skin is warm. No rash noted. No erythema.  Psychiatric: Affect and judgment normal.      LABORATORY PANEL:   CBC  Recent Labs Lab 07/20/16 0813  WBC 6.1  HGB 8.7*  HCT 25.5*  PLT 289   ------------------------------------------------------------------------------------------------------------------  Chemistries   Recent Labs Lab 07/19/16 0514  NA 141  K 3.8  CL 115*  CO2 22  GLUCOSE 96  BUN 30*  CREATININE 0.84  CALCIUM 9.6  MG 1.8   ------------------------------------------------------------------------------------------------------------------  Cardiac Enzymes  Recent Labs Lab 07/18/16 2211  TROPONINI <0.03   ------------------------------------------------------------------------------------------------------------------  RADIOLOGY:  Dg Chest 2 View  Result Date: 07/18/2016 CLINICAL DATA:  Altered mental status.  Evaluate for pneumonia. EXAM: CHEST  2 VIEW COMPARISON:  Radiograph 06/21/2016 FINDINGS: Tip of the right chest port remains in the SVC. Unchanged cardiomediastinal contours with aortic atherosclerosis. No focal airspace disease to suggest pneumonia. The lungs remain hyperinflated. No pulmonary edema, pleural fluid or pneumothorax. Remote bilateral rib fractures are unchanged. IMPRESSION: No evidence of pneumonia.  No acute abnormality. Thoracic aortic atherosclerosis. Electronically Signed   By: Jeb Levering M.D.   On: 07/18/2016 22:59   Dg  Hip  Unilat With Pelvis 2-3 Views Right  Result Date: 07/18/2016 CLINICAL DATA:  Continued right hip pain after surgery. No recent injury. EXAM: DG HIP (WITH OR WITHOUT PELVIS) 2-3V RIGHT COMPARISON:  Postoperative radiograph 06/24/2016 FINDINGS: Right hip arthroplasty in expected alignment. No periprosthetic lucency. No periprosthetic fracture. The remainder the bony pelvis is intact. No additional or acute fracture. Pubic symphysis and sacroiliac joints are congruent. Vascular calcifications. A safety pin projecting over the a central pelvis is presumably external to the patient. Gastrostomy tube is incidentally noted in the upper abdomen. IMPRESSION: Expected postoperative appearance of the right hip arthroplasty. No acute osseous abnormality. A safety pin projecting over the central pelvis is presumably external to the patient, recommend correlation for external artifact. Electronically Signed   By: Jeb Levering M.D.   On: 07/18/2016 22:57     ASSESSMENT AND PLAN:   82 y.o. female with a history of throat and colon cancer with G-tube, left hip fracture status post hip replacement now being admitted with:  1.Chronic anemia-Discuss case with Dr.Byrnett and patient's son. Hemoglobin has remained stable. Her baseline hemoglobin is between 8 and 9. She has anemia of chronic disease. Her hemoglobin at 10 prior to recent discharge was after blood transfusion. She has no acute evidence of GI bleed. Her hemoglobin is currently at baseline. She has anemia of chronic disease. EGD has not recommended however I did give the option to the patient's son.  After discussing current results and low probability of finding etiology of anemia of chronic disease on EGD as per Dr. Bary Castilla, son hs decided to defer EGD.   2. Failure to thrive, generalized weakness and fatigue secondary to #1- PT consult. Patient will be discharged to skilled nursing facility when stable.  3. Erythema of abdominal wall at PEG tube  site-Patient does not show any signs of sepsis and has recently been treated with intravenous antibiotics. In the past culture was positive for Escherichia coli.  Most recent culture showed yeast. No antibiotics area Continue nystatin powder.  4. History of hypertension-continue metoprolol and ramipril and aspirin 5. History of depression-continue Prozac Continue vitamin D, B12, Colace, Megace.   6. History of throat cancer: Patient had recent ENT evaluation. Due to her complaint of difficulty swallowing will obtain speech evaluation.       Management plans discussed with the patient and she is in agreement.  CODE STATUS: full  TOTAL TIME TAKING CARE OF THIS PATIENT: 45 minutes.   Rounded with nursing staff Discussed with patient's son  POSSIBLE D/C tomorrow to skilled nursing facility DEPENDING ON CLINICAL CONDITION.   Ben Habermann M.D on 07/20/2016 at 11:00 AM  Between 7am to 6pm - Pager - [phone] After 6pm go to [email or web address] - password [email or web address] [facility]  Office  [phone]  CC: Primary care physician; Lelon Huh, MD  Note: This dictation was prepared with Dragon dictation along with smaller phrase technology. Any transcriptional errors that result from this process are unintentional.

## 2016-07-20 NOTE — Clinical Social Work Note (Signed)
CSW has been able to obtain a bed offer from [facility] which was patient and family's first choice. Will facilitate discharge when time. Shela Leff MSW,LCSW [phone]

## 2016-07-20 NOTE — Care Management Note (Signed)
Case Management Note  Patient Details  Name: ATALEE CALIP MRN: [id number] Date of Birth: 09/02/1933  Subjective/Objective:    [facility]-SW is following for SNF placement.                 Action/Plan:   Expected Discharge Date:  07/21/16               Expected Discharge Plan:     In-House Referral:     Discharge planning Services     Post Acute Care Choice:    Choice offered to:     DME Arranged:    DME Agency:     HH Arranged:    HH Agency:     Status of Service:     If discussed at [employer] [facility] [employer], dates discussed:    Additional Comments:  Marelyn Rouser A, RN 07/20/2016, 8:07 AM

## 2016-07-20 NOTE — Progress Notes (Signed)
Initial Nutrition Assessment  DOCUMENTATION CODES:   Non-severe (moderate) malnutrition in context of chronic illness, Underweight  INTERVENTION:  -EN: recommend continuing 10 hour night time feedings; recommend Jevity 1.5 at rate of 65 ml/hr providing 975 kcals, 42 g of protein and 495 mL of free water). Recommend continuing Prostat daily as well for additional 100 kcals, 15 g of protein) -Diet advanced today, monitor po intake. Night time feeding regimen will need to be adjusted based on po intake and weight trend.  -Pt would likely benefit from addition of Ensure supplement during the day (could consider condition order where Ensure is given if po intake at meal time is not eat least 25-50%)   NUTRITION DIAGNOSIS:   Inadequate oral intake related to chronic illness, dysphagia, poor appetite as evidenced by per patient/family report.  GOAL:   Patient will meet greater than or equal to 90% of their needs  MONITOR:   TF tolerance, PO intake  REASON FOR ASSESSMENT:   Consult Poor PO  ASSESSMENT:    82 yo female admitted with lower GI bleed with anemia, FTT with generalized weakness, fatigue, poor appetite and wt loss. Pt with PEG tube with cellulitis/contact dermatitis  Pt alert but confused on visit today (did not know she had a feeding tube). Spoke with Richardson Landry RD at [facility]. Pt has been receiving night time feedings of Jevity 1.2 at rate of 71 ml/hr since 07/06/16 (852 kcals, 39 g protein); RD recently added Prostat daily to regimen (additional 100 kcals, 15 g protein). RD reports pt typically eats <25% of meals per day. Pt loves Ensure but son requested that pt not receive it once pt was started on tube feeding. Per last visit, pt lived on [place] and Ensure prior to being discharged to nursing home.  Nutrition-Focused physical exam completed. Findings are mild/moderate fat depletion, mild/moderate muscle depletion, and no edema.    Past Medical History:  Diagnosis Date   . Arthritis    fingers  . Cancer ([place])    rectal  . Cancer ([place]) 2012   colon cancer  . Cancer of contiguous sites of hypopharynx ([place]) 12/23/2015  . Colon cancer ([place])   . Difficult intubation   . Diverticulosis   . Hemorrhoids   . History of chicken pox   . Hypertension   . Osteopenia   . Status post chemotherapy    colon cancer  . Status post radiation therapy    colon cancer 2013     Diet Order:  DIET DYS 3 Room service appropriate? Yes with Assist; Fluid consistency: Thin  Skin:  Wound (see comment) (multiple stage I/II pressure ulcers)  Last BM:  9/17   Labs: Hgb 8.7  Meds: NS at 75 ml/hr, megace,   Height:   Ht Readings from Last 1 Encounters:  07/20/16 5\' 5"  (1.651 m)    Weight:   Wt Readings from Last 1 Encounters:  07/20/16 104 lb 6 oz (47.3 kg)    Filed Weights   07/18/16 2122 07/20/16 1300  Weight: 112 lb (50.8 kg) 104 lb 6 oz (47.3 kg)   Wt Readings from Last 10 Encounters:  07/20/16 104 lb 6 oz (47.3 kg)  06/23/16 108 lb 9.6 oz (49.3 kg)  06/02/16 102 lb 6.4 oz (46.4 kg)  05/19/16 101 lb 11.9 oz (46.2 kg)  04/15/16 106 lb 0.7 oz (48.1 kg)  04/15/16 106 lb 14.8 oz (48.5 kg)  03/18/16 105 lb 8 oz (47.9 kg)  02/19/16 109 lb 3 oz (49.5 kg)  02/05/16 112 lb 3.4 oz (50.9 kg)  01/27/16 114 lb (51.7 kg)     BMI:  Body mass index is 17.37 kg/m.  Estimated Nutritional Needs:   Kcal:  [id number] kcals  Protein:  60-80 g  Fluid:  >/= 1.5 L  EDUCATION NEEDS:   No education needs identified at this time  [place], [place], [place] [phone] Pager  [phone] Weekend/On-Call Pager

## 2016-07-20 NOTE — Progress Notes (Signed)
Physical Therapy Treatment Patient Details Name: Emily [name] MRN: [id number] DOB: November 01, 1933 Today's Date: 07/20/2016    History of Present Illness 82 yo female who has come from SNF where she has been rehabilitating her hip fracture with THA, anterior approach with WBAT, and now is admitted with PEG tube with cellulitis and GI bleed.    PT Comments    Pt in bed ready for session.  Participated in exercises as described below.  She transitioned to edge of bed with min/mod assist with difficulty gaining balance and required min guard to prevent falls.  She was able to stand x 3 and transfer to chair at bedside with walker and min guard.  She is unsteady on her feet at times and requires assist to prevent falls.  While standing exercises were performed as described below.  She was fatigued with session and gait limited by weakness and hesitancy to weight bear on RLE.   Follow Up Recommendations  SNF     Equipment Recommendations  None recommended by PT    Recommendations for Other Services       Precautions / Restrictions Precautions Precautions: Anterior Hip;Fall (PEG tube) Precaution Booklet Issued: No Precaution Comments: Pt is DNR Restrictions Weight Bearing Restrictions: Yes RLE Weight Bearing: Weight bearing as tolerated    Mobility  Bed Mobility Overal bed mobility: Needs Assistance Bed Mobility: Supine to Sit     Supine to sit: Min assist;Mod assist     General bed mobility comments: encouragement and verbal cues for hand placement  Transfers Overall transfer level: Needs assistance Equipment used: Rolling walker (2 wheeled) Transfers: Sit to/from [facility] Sit to Stand: Min guard Stand pivot transfers: Min guard       General transfer comment: unsteady at times posterior imbalances,  Ambulation/Gait Ambulation/Gait assistance: Min assist Ambulation Distance (Feet): 3 Feet Assistive device: Rolling walker (2 wheeled) Gait  Pattern/deviations: Step-to pattern   Gait velocity interpretation: Below normal speed for age/gender General Gait Details: limited by general fatigue and discomfort with weight bearing RLE   Stairs            Wheelchair Mobility    Modified Rankin (Stroke Patients Only)       Balance Overall balance assessment: Needs assistance Sitting-balance support: Feet supported Sitting balance-Leahy Scale: Fair   Postural control: Posterior lean Standing balance support: Bilateral upper extremity supported Standing balance-Leahy Scale: Poor                      Cognition Arousal/Alertness: Awake/alert Behavior During Therapy: WFL for tasks assessed/performed Overall Cognitive Status: Within Functional Limits for tasks assessed                      Exercises Total Joint Exercises Ankle Circles/Pumps: AROM;Both;20 reps;Supine Quad Sets: Strengthening;10 reps Gluteal Sets: 10 reps;Strengthening Towel Squeeze: AROM;Both;10 reps;Seated Heel Slides: AROM;AAROM;Both;10 reps Hip ABduction/ADduction: AROM;AAROM;Both;10 reps Straight Leg Raises: AAROM;Both;10 reps;Supine Marching in Standing: AROM;20 reps;Both;Standing    General Comments        Pertinent Vitals/Pain Pain Assessment: 0-10 Pain Score: 3  Pain Location: R hip Pain Descriptors / Indicators: Aching Pain Intervention(s): Monitored during session;Limited activity within patient's tolerance    Home Living                      Prior Function            PT Goals (current goals can now be found in the care  plan section) Progress towards PT goals: Progressing toward goals    Frequency    Min 2X/week      PT Plan Current plan remains appropriate    Co-evaluation             End of Session Equipment Utilized During Treatment: Gait belt Activity Tolerance: Patient limited by fatigue Patient left: in chair;with call bell/phone within reach;with chair alarm set     Time:  1355-1421 PT Time Calculation (min) (ACUTE ONLY): 26 min  Charges:  $Therapeutic Exercise: 8-22 mins $Therapeutic Activity: 8-22 mins                    G Codes:      Chesley Noon 08-02-2016, 3:06 PM

## 2016-07-20 NOTE — Plan of Care (Signed)
Problem: Nutrition: Goal: Adequate nutrition will be maintained Outcome: Progressing Pt will start tube feeding tonight per G tube.

## 2016-07-20 NOTE — Progress Notes (Signed)
Case reviewed and discussed with Dr. Benjie Karvonen. EGD unlikely to identify significant source of blood loss, can be completed if needed. Consult on hold for the present.

## 2016-07-20 NOTE — Evaluation (Signed)
Clinical/Bedside Swallow Evaluation Patient Details  Name: Emily [name] MRN: [id number] Date of Birth: 01-02-1933  Today's Date: 07/20/2016 Time: SLP Start Time (ACUTE ONLY): 1100 SLP Stop Time (ACUTE ONLY): 1200 SLP Time Calculation (min) (ACUTE ONLY): 60 min  Past Medical History:  Past Medical History:  Diagnosis Date  . Arthritis    fingers  . Cancer ([place])    rectal  . Cancer ([place]) 2012   colon cancer  . Cancer of contiguous sites of hypopharynx ([place]) 12/23/2015  . Colon cancer ([place])   . Difficult intubation   . Diverticulosis   . Hemorrhoids   . History of chicken pox   . Hypertension   . Osteopenia   . Status post chemotherapy    colon cancer  . Status post radiation therapy    colon cancer 2013   Past Surgical History:  Past Surgical History:  Procedure Laterality Date  . ABDOMINAL HYSTERECTOMY    . ABDOMINAL HYSTERECTOMY  1980   partial  . APPENDECTOMY    . APPENDECTOMY  [id number]   Dr. Pat Patrick  . BREAST CYST ASPIRATION Bilateral    negative  . BREAST SURGERY     cyst removal  . BREAST SURGERY  1960   Breast Biopsy  . CARDIAC CATHETERIZATION N/A 06/23/2016   Procedure: Left Heart Cath and Coronary Angiography;  Surgeon: Corey Skains, MD;  Location: [facility];  Service: Cardiovascular;  Laterality: N/A;  . COLON SURGERY  06/28/2013   resected tumor from colon; [facility]  . COLONOSCOPY    . COLONOSCOPY N/A 10/01/2015   Procedure: COLONOSCOPY;  Surgeon: [name], MD;  Location: [facility] ENDOSCOPY;  Service: Endoscopy;  Laterality: N/A;  . DILATION AND CURETTAGE OF UTERUS  1957  . DIRECT LARYNGOSCOPY N/A 12/11/2015   Procedure: DIRECT LARYNGOSCOPY;  Surgeon: Clyde Canterbury, MD;  Location: [facility] ORS;  Service: ENT;  Laterality: N/A;  . ESOPHAGOSCOPY N/A 12/11/2015   Procedure: ESOPHAGOSCOPY;  Surgeon: Clyde Canterbury, MD;  Location: [facility] ORS;  Service: ENT;  Laterality: N/A;  . EUS N/A 01/20/2013   Procedure: LOWER ENDOSCOPIC  ULTRASOUND (EUS);  Surgeon: Milus Banister, MD;  Location: [facility];  Service: Endoscopy;  Laterality: N/A;  . PEG PLACEMENT N/A 01/01/2016   Procedure: PERCUTANEOUS ENDOSCOPIC GASTROSTOMY (PEG) PLACEMENT;  Surgeon: [name], MD;  Location: [facility] ORS;  Service: General;  Laterality: N/A;  . PORTACATH PLACEMENT Right 01/01/2016   Procedure: INSERTION PORT-A-CATH;  Surgeon: [name], MD;  Location: [facility] ORS;  Service: General;  Laterality: Right;  . TONSILLECTOMY    . TOTAL HIP ARTHROPLASTY Right 06/24/2016   Procedure: TOTAL HIP ARTHROPLASTY ANTERIOR APPROACH;  Surgeon: Hessie Knows, MD;  Location: [facility] ORS;  Service: Orthopedics;  Laterality: Right;   HPI:  Pt is a 82 y.o. female with a known history of Arthritis, throat cancer status post feeding tube, readiation/chemotherapy, colon cancer, hypertension, osteopenia- lives alone and has some episodes of forgetfulness for last few months. She was treated with radiation and feeding tube for throat cancer 5-6 month ago and since then her oncologist told that the cancer is completely resolved. Since then she has decreased overall eating and she is losing weight up until now. Son had visit to oncologist and primary care doctor for that, she was also started on Megace and Prozac, but not much help so she is finally scheduled to see a neurologist in 1-2 weeks. Pt appeared to have fallen but was not found for 2 days  until Son went by the home. Pt was found confused at that time. She was found to be dehydrated, and her troponin was very high, right hip fracture found on x-ray. Currently, pt is alert/awake more oriented; she is able to hold a basic conversation and follow general instruction w/ SLP. Pt was feeding herself her lunch meal this afternoon.    Assessment / Plan / Recommendation Clinical Impression  Pt appears at reduced risk for aspiration of po intake following general aspiration precautions and monitoring for foods that are  difficult and uncomfortable to swallow. Pt consumed po trials of thin liquids via cup and straw then trials of puree/solids w/ no immediate, overt s/s of aspiration; no decline in respiratory status or change in vocal quality was noted during po trials. Oral phase of swallowing appeared wfl for mastication of po's and overall oral clearing. However, pt stated it was "difficult" to eat meats/solids d/t the discomfort when swallowing indicating the discomfort in the pharyngeal area - suspect this could be related to the area of her Throat Ca and subsequent radiation tx in that area. Pt does clear solids/foods when given time and when the food is moist and broken down. She did not indicate "easier" foods when asked but seemed interested in trying a grilled cheese sandwich and pudding for lunch meal today; discussed avoiding crusts of bread. Recommend pt inititate a Dysphagia 3 diet w/ thin liquids w/ modification of cutting/chopping meats and add gravy to moisten for easier swallowing. Recommend general aspiration precautions and giving Meds in a Puree - CRUSHED, for easier swallowing as this was pt's main complaint when asked about swallowing. ST will f/u for any further education and toleratoin of diet as indicated while pt is admitted. Dietician following for supplements.     Aspiration Risk   (reduced following precautions)    Diet Recommendation  Dysphagia 3 w/ chopped meats moistened well; moist foods overall; aspiration precautions. Tray setup at meals.   Medication Administration: Crushed with puree (as able)    Other  Recommendations Recommended Consults:  (as needed) Oral Care Recommendations: Oral care BID;Staff/trained caregiver to provide oral care   Follow up Recommendations None      Frequency and Duration            Prognosis Prognosis for Safe Diet Advancement: Good Barriers to Reach Goals:  (baseline throat ca w/ XRT)      Swallow Study   General Date of Onset: 07/18/16 HPI:  Pt is a 82 y.o. female with a known history of Arthritis, throat cancer status post feeding tube, readiation/chemotherapy, colon cancer, hypertension, osteopenia- lives alone and has some episodes of forgetfulness for last few months. She was treated with radiation and feeding tube for throat cancer 5-6 month ago and since then her oncologist told that the cancer is completely resolved. Since then she has decreased overall eating and she is losing weight up until now. Son had visit to oncologist and primary care doctor for that, she was also started on Megace and Prozac, but not much help so she is finally scheduled to see a neurologist in 1-2 weeks. Pt appeared to have fallen but was not found for 2 days until Son went by the home. Pt was found confused at that time. She was found to be dehydrated, and her troponin was very high, right hip fracture found on x-ray. Currently, pt is alert/awake more oriented; she is able to hold a basic conversation and follow general instruction w/ SLP. Pt was  feeding herself her lunch meal this afternoon.  Type of Study: Bedside Swallow Evaluation Previous Swallow Assessment: 8.22.17 during admission. PEG placement in 01/2016 during tx of Throat Ca.  Diet Prior to this Study: Dysphagia 3 (soft);Thin liquids (per pt) Temperature Spikes Noted: No (wbc not elevated) Respiratory Status: Room air History of Recent Intubation: No Behavior/Cognition: Alert;Cooperative;Pleasant mood;Requires cueing [facility]?) Oral Cavity Assessment: Within Functional Limits Oral Care Completed by SLP: Recent completion by staff Oral Cavity - Dentition: Adequate natural dentition Vision: Functional for self-feeding Self-Feeding Abilities: Able to feed self;Needs assist;Needs set up Patient Positioning: Upright in bed Baseline Vocal Quality: Normal;Low vocal intensity Volitional Cough: Strong Volitional Swallow: Able to elicit    Oral/Motor/Sensory Function Overall Oral Motor/Sensory Function:  Within functional limits   Ice Chips Ice chips: Within functional limits Presentation: Spoon (fed; 2 trials)   Thin Liquid Thin Liquid: Within functional limits Presentation: Cup;Self Fed;Straw (5 trials each)    Nectar Thick Nectar Thick Liquid: Not tested   Honey Thick Honey Thick Liquid: Not tested   Puree Puree: Within functional limits Presentation: Self Fed;Spoon (4 trials)   Solid   GO   Solid: Within functional limits (soft, broken down and moistened foods) Presentation: Self Fed;Spoon (3 trials) Other Comments: pt stated it was "difficult" to swallow some foods sometimes d/t the uncomfortable feeling in her throat when trying to swallow "tough" foods. Suspect this could be related to the Throat Ca and tx in the Pharyngeal phase area. Pt does better w/ food boluses that are small, moist, and broken down well. She stated she avoids some foods because of difficulty swallowing "sometimes".        Orinda Kenner, MS, CCC-SLP  Watson,Katherine 07/20/2016,12:08 PM

## 2016-07-20 NOTE — Care Management Important Message (Signed)
Important Message  Patient Details  Name: Emily [name] MRN: [id number] Date of Birth: 1932/12/15   Medicare Important Message Given:  Yes    Koben Daman A, RN 07/20/2016, 7:14 AM

## 2016-07-21 ENCOUNTER — Telehealth: Payer: Self-pay | Admitting: Family Medicine

## 2016-07-21 DIAGNOSIS — R627 Adult failure to thrive: Secondary | ICD-10-CM | POA: Diagnosis not present

## 2016-07-21 DIAGNOSIS — R197 Diarrhea, unspecified: Secondary | ICD-10-CM | POA: Diagnosis not present

## 2016-07-21 DIAGNOSIS — F418 Other specified anxiety disorders: Secondary | ICD-10-CM | POA: Diagnosis not present

## 2016-07-21 DIAGNOSIS — F17210 Nicotine dependence, cigarettes, uncomplicated: Secondary | ICD-10-CM | POA: Diagnosis present

## 2016-07-21 DIAGNOSIS — G4700 Insomnia, unspecified: Secondary | ICD-10-CM | POA: Diagnosis not present

## 2016-07-21 DIAGNOSIS — B369 Superficial mycosis, unspecified: Secondary | ICD-10-CM | POA: Diagnosis not present

## 2016-07-21 DIAGNOSIS — Z9221 Personal history of antineoplastic chemotherapy: Secondary | ICD-10-CM | POA: Diagnosis not present

## 2016-07-21 DIAGNOSIS — Z5189 Encounter for other specified aftercare: Secondary | ICD-10-CM | POA: Diagnosis not present

## 2016-07-21 DIAGNOSIS — Z96641 Presence of right artificial hip joint: Secondary | ICD-10-CM | POA: Diagnosis not present

## 2016-07-21 DIAGNOSIS — M6281 Muscle weakness (generalized): Secondary | ICD-10-CM | POA: Diagnosis not present

## 2016-07-21 DIAGNOSIS — R05 Cough: Secondary | ICD-10-CM | POA: Diagnosis not present

## 2016-07-21 DIAGNOSIS — C4442 Squamous cell carcinoma of skin of scalp and neck: Secondary | ICD-10-CM | POA: Diagnosis not present

## 2016-07-21 DIAGNOSIS — E440 Moderate protein-calorie malnutrition: Secondary | ICD-10-CM | POA: Insufficient documentation

## 2016-07-21 DIAGNOSIS — E46 Unspecified protein-calorie malnutrition: Secondary | ICD-10-CM | POA: Diagnosis not present

## 2016-07-21 DIAGNOSIS — R1310 Dysphagia, unspecified: Secondary | ICD-10-CM | POA: Diagnosis present

## 2016-07-21 DIAGNOSIS — R64 Cachexia: Secondary | ICD-10-CM | POA: Diagnosis present

## 2016-07-21 DIAGNOSIS — Z803 Family history of malignant neoplasm of breast: Secondary | ICD-10-CM | POA: Diagnosis not present

## 2016-07-21 DIAGNOSIS — D638 Anemia in other chronic diseases classified elsewhere: Secondary | ICD-10-CM | POA: Diagnosis not present

## 2016-07-21 DIAGNOSIS — Z7982 Long term (current) use of aspirin: Secondary | ICD-10-CM | POA: Diagnosis not present

## 2016-07-21 DIAGNOSIS — Z90710 Acquired absence of both cervix and uterus: Secondary | ICD-10-CM | POA: Diagnosis not present

## 2016-07-21 DIAGNOSIS — Z681 Body mass index (BMI) 19 or less, adult: Secondary | ICD-10-CM | POA: Diagnosis not present

## 2016-07-21 DIAGNOSIS — F329 Major depressive disorder, single episode, unspecified: Secondary | ICD-10-CM | POA: Diagnosis not present

## 2016-07-21 DIAGNOSIS — L03311 Cellulitis of abdominal wall: Secondary | ICD-10-CM | POA: Diagnosis present

## 2016-07-21 DIAGNOSIS — Z7401 Bed confinement status: Secondary | ICD-10-CM | POA: Diagnosis not present

## 2016-07-21 DIAGNOSIS — Z85048 Personal history of other malignant neoplasm of rectum, rectosigmoid junction, and anus: Secondary | ICD-10-CM | POA: Diagnosis not present

## 2016-07-21 DIAGNOSIS — K5900 Constipation, unspecified: Secondary | ICD-10-CM | POA: Diagnosis not present

## 2016-07-21 DIAGNOSIS — K922 Gastrointestinal hemorrhage, unspecified: Secondary | ICD-10-CM | POA: Diagnosis not present

## 2016-07-21 DIAGNOSIS — B379 Candidiasis, unspecified: Secondary | ICD-10-CM | POA: Diagnosis not present

## 2016-07-21 DIAGNOSIS — Z471 Aftercare following joint replacement surgery: Secondary | ICD-10-CM | POA: Diagnosis not present

## 2016-07-21 DIAGNOSIS — L8990 Pressure ulcer of unspecified site, unspecified stage: Secondary | ICD-10-CM | POA: Insufficient documentation

## 2016-07-21 DIAGNOSIS — R2689 Other abnormalities of gait and mobility: Secondary | ICD-10-CM | POA: Diagnosis not present

## 2016-07-21 DIAGNOSIS — R278 Other lack of coordination: Secondary | ICD-10-CM | POA: Diagnosis not present

## 2016-07-21 DIAGNOSIS — Z23 Encounter for immunization: Secondary | ICD-10-CM | POA: Diagnosis not present

## 2016-07-21 DIAGNOSIS — M79651 Pain in right thigh: Secondary | ICD-10-CM | POA: Diagnosis not present

## 2016-07-21 DIAGNOSIS — Z923 Personal history of irradiation: Secondary | ICD-10-CM | POA: Diagnosis not present

## 2016-07-21 DIAGNOSIS — K274 Chronic or unspecified peptic ulcer, site unspecified, with hemorrhage: Secondary | ICD-10-CM | POA: Diagnosis not present

## 2016-07-21 DIAGNOSIS — Z9889 Other specified postprocedural states: Secondary | ICD-10-CM | POA: Diagnosis not present

## 2016-07-21 DIAGNOSIS — M25551 Pain in right hip: Secondary | ICD-10-CM | POA: Diagnosis not present

## 2016-07-21 DIAGNOSIS — I10 Essential (primary) hypertension: Secondary | ICD-10-CM | POA: Diagnosis not present

## 2016-07-21 DIAGNOSIS — S72001S Fracture of unspecified part of neck of right femur, sequela: Secondary | ICD-10-CM | POA: Diagnosis not present

## 2016-07-21 DIAGNOSIS — R41841 Cognitive communication deficit: Secondary | ICD-10-CM | POA: Diagnosis not present

## 2016-07-21 DIAGNOSIS — R531 Weakness: Secondary | ICD-10-CM | POA: Diagnosis not present

## 2016-07-21 DIAGNOSIS — D649 Anemia, unspecified: Secondary | ICD-10-CM | POA: Diagnosis not present

## 2016-07-21 DIAGNOSIS — R1312 Dysphagia, oropharyngeal phase: Secondary | ICD-10-CM | POA: Diagnosis not present

## 2016-07-21 LAB — HEMOGLOBIN
HEMOGLOBIN: 7.8 g/dL — AB (ref 12.0–16.0)
HEMOGLOBIN: 8.4 g/dL — AB (ref 12.0–16.0)

## 2016-07-21 MED ORDER — ORAL CARE MOUTH RINSE
15.0000 mL | Freq: Two times a day (BID) | OROMUCOSAL | Status: DC
  Administered 2016-07-21: 15 mL via OROMUCOSAL

## 2016-07-21 MED ORDER — NYSTATIN 100000 UNIT/GM EX CREA: TOPICAL_CREAM | Freq: Two times a day (BID) | CUTANEOUS | 0 refills | Status: DC

## 2016-07-21 MED ORDER — HEPARIN SOD (PORK) LOCK FLUSH 100 UNIT/ML IV SOLN
500.0000 [IU] | Freq: Once | INTRAVENOUS | Status: AC
  Administered 2016-07-21: 500 [IU] via INTRAVENOUS
  Filled 2016-07-21: qty 5

## 2016-07-21 MED ORDER — SODIUM CHLORIDE 0.9% FLUSH: 10.0000 mL | INTRAVENOUS | Status: DC | PRN

## 2016-07-21 MED ORDER — PANTOPRAZOLE SODIUM 40 MG PO TBEC: 40.0000 mg | DELAYED_RELEASE_TABLET | Freq: Every day | ORAL | 0 refills | Status: DC

## 2016-07-21 MED ORDER — PRO-STAT SUGAR FREE PO LIQD
30.0000 mL | Freq: Every day | ORAL | Status: DC
  Administered 2016-07-21: 30 mL

## 2016-07-21 MED ORDER — PRO-STAT SUGAR FREE PO LIQD: 30.0000 mL | Freq: Two times a day (BID) | ORAL | 0 refills | Status: DC

## 2016-07-21 MED ORDER — CHLORHEXIDINE GLUCONATE 0.12 % MT SOLN
15.0000 mL | Freq: Two times a day (BID) | OROMUCOSAL | Status: DC
  Filled 2016-07-21: qty 15

## 2016-07-21 MED ORDER — HYDROCODONE-ACETAMINOPHEN 5-325 MG PO TABS: 1.0000 | ORAL_TABLET | ORAL | 0 refills | Status: DC | PRN

## 2016-07-21 MED ORDER — JEVITY 1.5 CAL/FIBER PO LIQD: 1000.0000 mL | Freq: Every day | ORAL | 30 refills | Status: AC

## 2016-07-21 NOTE — Telephone Encounter (Signed)
I spoke with pt daughter-in-law [name] and she states pt is going to [facility] home and does not need an appointment with Dr Caryn Section right now/MW

## 2016-07-21 NOTE — Clinical Social Work Note (Signed)
Patient's bed offer from [facility] is no longer valid due to [facility] having to stop all admissions indefinitely due to a state investigation. CSW has contacted patient's son: Mr. Cowing at work: [phone] to inform him of this and to extend the other bed offers as well as let him know that the physician wishes to discharge his mother today. Mr.Abts declined [facility] and [facility] stating that "they are on a side of town I do not want her on." He stated she has been to [facility] in the past and does not want her to return there. He then stated that he would choose [facility] but would want to speak with them first to ensure that his mother would be taken care of to his standards. CSW contacted Florentina Jenny at [facility] and they are going to contact patient's son and then let me know if we can proceed.  Shela Leff MSW,LCSW [phone]

## 2016-07-21 NOTE — Telephone Encounter (Signed)
She can have the 3:30 appointment on October 3rd. If she needs follow up sooner she will need to see another provider since I will be out of the office.

## 2016-07-21 NOTE — Progress Notes (Signed)
Nutrition Follow-up  DOCUMENTATION CODES:   Non-severe (moderate) malnutrition in context of chronic illness, Underweight  INTERVENTION:  -Continue Jevity 1.5 at 33ml/hr for 10 hours during the night at this time with prostat daily.   -Encourage po intake.  May need to add ensure as conditional order if unable to eat 25-50% of meals.  Son currently requesting for pt not to receive ensure with tube feeding   NUTRITION DIAGNOSIS:   Inadequate oral intake related to chronic illness, dysphagia, poor appetite as evidenced by per patient/family report.    GOAL:   Patient will meet greater than or equal to 90% of their needs    MONITOR:   TF tolerance, PO intake  REASON FOR ASSESSMENT:   Consult Poor PO  ASSESSMENT:     Noted not planning EGD at this time.  Medications reviewed:    Labs reviewed:   Diet Order:  DIET DYS 3 Room service appropriate? Yes with Assist; Fluid consistency: Thin  Skin:  Wound (see comment) (multiple stage I/II pressure ulcers)  Last BM:  9/17  Height:   Ht Readings from Last 1 Encounters:  07/20/16 5\' 5"  (1.651 m)    Weight:   Wt Readings from Last 1 Encounters:  07/21/16 107 lb (48.5 kg)    Ideal Body Weight:     BMI:  Body mass index is 17.81 kg/m.  Estimated Nutritional Needs:   Kcal:  [id number] kcals  Protein:  60-80 g  Fluid:  >/= 1.5 L  EDUCATION NEEDS:   No education needs identified at this time  Naina Sleeper B. Zenia Resides, [place], [place] (pager) Weekend/On-Call pager [phone])

## 2016-07-21 NOTE — Telephone Encounter (Signed)
Pt is being discharged from [facility] today for a lower GI bleed.    Please advice of an appointment for pt to come in for a hospital follow up.  I did advise [facility] we would call pt with her appointment.  Pt CB#[phone]/MW

## 2016-07-21 NOTE — Clinical Social Work Note (Signed)
CSW informed by patient's nurse, Eustaquio Maize, that patient's son declined phone call to notify when EMS picked patient up. Please refer to nurse documentation. Shela Leff MSW,LCSW [phone]

## 2016-07-21 NOTE — Discharge Summary (Signed)
[name] [facility] [place] NAME: Emily [name]    MR#:  [id number]  DATE OF BIRTH:  1932/12/28  DATE OF ADMISSION:  07/18/2016 ADMITTING PHYSICIAN: Emily Bridge, DO  DATE OF DISCHARGE: 07/21/2016  PRIMARY CARE PHYSICIAN: Emily Huh, MD    ADMISSION DIAGNOSIS:  Cellulitis of abdominal wall [L03.311] Lower GI bleed [K92.2] Right hip pain [M25.551] Failure to thrive in adult [R62.7]  DISCHARGE DIAGNOSIS:  Active Problems:   Chronic anemia    Malnutrition of moderate degree   Pressure injury of skin   SECONDARY DIAGNOSIS:   Past Medical History:  Diagnosis Date  . Arthritis    fingers  . Cancer (Emily [name])    rectal  . Cancer (Emily [name]) 2012   colon cancer  . Cancer of contiguous sites of hypopharynx (Emily [name]) 12/23/2015  . Colon cancer (Emily [name])   . Difficult intubation   . Diverticulosis   . Hemorrhoids   . History of chicken pox   . Hypertension   . Osteopenia   . Status post chemotherapy    colon cancer  . Status post radiation therapy    colon cancer 2013    HOSPITAL COURSE:   82 y.o.femalewith a history of throat and colon cancer with G-tube, left hip fracture status post hip replacementnow being admitted with:  1.Chronic anemia of dosease-I discussed case with Dr.Byrnett and patient's son. Hemoglobin has remained stable. Her baseline hemoglobin is between 8 and 9. She has anemia of chronic disease. Her hemoglobin at 10 prior to recent discharge was after blood transfusion. She has no acute evidence of GI bleed. She has anemia of chronic disease. EGD has not recommended however I did give the option to the patient's son.  After discussing current results and low probability of finding etiology of anemia of chronic disease on EGD as per Dr. Bary Emily [name], son has decided to defer EGD. She had no signs of GIB while in the hospital   2. Failure to thrive, generalized weakness and fatigue secondary to #1- Patient will be  discharged to skilled nursing facility today. She will need PEG feedings at night  3. Erythema of abdominal wall at PEG tube site-Patient does not show any signs of sepsis and has recently been treated with intravenous antibiotics. In the past culture was positive for Escherichia coli.  Most recent culture showed yeast. No antibiotics given. She can continue nystatin powder.  4. History of hypertension-continue metoprolol and ramipril and aspirin 5. History of depression-continue Prozac Continue vitamin D, B12, Colace, Megace.   6. History of throat cancer: Patient had recent ENT evaluation asper patient's son which did not show re occurence of cancer. Due to her complaint of difficulty swallowing speech evaluation obtained who recommended:  Dysphagia 3 w/ chopped meats moistened well; moist foods overall; aspiration precautions. Tray setup at meals.   Medication Administration: Crushed with puree (as able)    DISCHARGE CONDITIONS AND DIET:   Stable Dysphagia 3 w/ chopped meats moistened well; moist foods overall; aspiration precautions. Tray setup at meals.   Medication Administration: Crushed with puree (as able  CONSULTS OBTAINED:  Treatment Team:  San Jetty, MD [name], MD  DRUG ALLERGIES:   Allergies  Allergen Reactions  . Amlodipine Besylate Other (See Comments)    Patient unaware of any allergy with this medicine  . Oxycodone     confusion    DISCHARGE MEDICATIONS:   Current Discharge Medication List    START taking these medications   Details  Amino Acids-Protein Hydrolys (FEEDING SUPPLEMENT, PRO-STAT SUGAR FREE 64,) LIQD Place 30 mLs into feeding tube 2 (two) times daily. Qty: 900 mL, Refills: 0    nystatin cream (MYCOSTATIN) Apply topically 2 (two) times daily. Qty: 30 g, Refills: 0    pantoprazole (PROTONIX) 40 MG tablet Take 1 tablet (40 mg total) by mouth daily. Qty: 30 tablet, Refills: 0      CONTINUE these medications which  have CHANGED   Details  HYDROcodone-acetaminophen (NORCO/VICODIN) 5-325 MG tablet Take 1-2 tablets by mouth every 4 (four) hours as needed for moderate pain. Qty: 30 tablet, Refills: 0    Nutritional Supplements (FEEDING SUPPLEMENT, JEVITY 1.5 CAL/FIBER,) LIQD Place 1,000 mLs into feeding tube at bedtime. Over 10 hours at night Qty: 1500 mL, Refills: 30      CONTINUE these medications which have NOT CHANGED   Details  Cholecalciferol (VITAMIN D-3) 1000 units CAPS Take 2,000 Units by mouth daily.    cyanocobalamin 1000 MCG tablet Take 1,000 mcg by mouth daily.    docusate sodium (COLACE) 100 MG capsule Take 1 capsule (100 mg total) by mouth 2 (two) times daily. Qty: 60 capsule, Refills: 0    FLUoxetine (PROZAC) 10 MG tablet Take 1 tablet (10 mg total) by mouth daily. Qty: 30 tablet, Refills: 3    guaiFENesin (ROBITUSSIN) 100 MG/5ML SOLN Take 10 mLs by mouth every 4 (four) hours as needed for cough or to loosen phlegm.    lactulose (CHRONULAC) 10 GM/15ML solution Take 30 mLs (20 g total) by mouth 2 (two) times daily as needed for moderate constipation or severe constipation. Qty: 240 mL, Refills: 0    megestrol (MEGACE) 40 MG tablet Take 1 tablet (40 mg total) by mouth 2 (two) times daily. Qty: 60 tablet, Refills: 2    metoprolol succinate (TOPROL-XL) 25 MG 24 hr tablet Take 0.5 tablets (12.5 mg total) by mouth daily. Qty: 30 tablet, Refills: 0    ramipril (ALTACE) 2.5 MG capsule Take 1 capsule (2.5 mg total) by mouth at bedtime. Qty: 30 capsule, Refills: 0      STOP taking these medications     aspirin EC 81 MG EC tablet      nystatin (MYCOSTATIN) 100000 UNIT/ML suspension               Today   CHIEF COMPLAINT:  No issues overnight   VITAL SIGNS:  Blood pressure (!) 153/66, pulse 76, temperature 98 F (36.7 C), temperature source Oral, resp. rate 18, height 5\' 5"  (1.651 m), weight 48.5 kg (107 lb), SpO2 98 %.   REVIEW OF SYSTEMS:  Review of Systems   Constitutional: Negative for chills, fever and malaise/fatigue.  HENT: Negative.  Negative for ear discharge, ear pain, hearing loss, nosebleeds and sore throat.   Eyes: Negative.  Negative for blurred vision and pain.  Respiratory: Negative.  Negative for cough, hemoptysis, shortness of breath and wheezing.   Cardiovascular: Negative.  Negative for chest pain, palpitations and leg swelling.  Gastrointestinal: Negative.  Negative for abdominal pain, blood in stool, diarrhea, nausea and vomiting.  Genitourinary: Negative.  Negative for dysuria.  Musculoskeletal: Negative.  Negative for back pain.  Skin: Negative.   Neurological: Positive for weakness. Negative for dizziness, tremors, speech change, focal weakness, seizures and headaches.  Endo/Heme/Allergies: Negative.  Does not bruise/bleed easily.  Psychiatric/Behavioral: Negative.  Negative for depression, hallucinations and suicidal ideas.     PHYSICAL EXAMINATION:  GENERAL:  82 y.o.-year-old patient lying in the bed with no acute distress.  NECK:  Supple, no jugular venous distention. No thyroid enlargement, no tenderness.  LUNGS: Normal breath sounds bilaterally, no wheezing, rales,rhonchi  No use of accessory muscles of respiration.  CARDIOVASCULAR: S1, S2 normal. No murmurs, rubs, or gallops.  ABDOMEN: Soft, non-tender, non-distended. Bowel sounds present. No organomegaly or mass.  PEG site  With erythema but not infected appearing EXTREMITIES: No pedal edema, cyanosis, or clubbing.  PSYCHIATRIC: The patient is alert and oriented x 3.  SKIN: No obvious rash, lesion, or ulcer.   DATA REVIEW:   CBC  Recent Labs Lab 07/20/16 0813  07/21/16 0957  WBC 6.1  --   --   HGB 8.7*  < > 8.4*  HCT 25.5*  --   --   PLT 289  --   --   < > = values in this interval not displayed.  Chemistries   Recent Labs Lab 07/19/16 0514  NA 141  K 3.8  CL 115*  CO2 22  GLUCOSE 96  BUN 30*  CREATININE 0.84  CALCIUM 9.6  MG 1.8     Cardiac Enzymes  Recent Labs Lab 07/18/16 2211  TROPONINI <0.03    Microbiology Results  @MICRORSLT48 @  RADIOLOGY:  No results found.    Management plans discussed with the patient and son she is in agreement. Stable for discharge SNF  Patient should follow up with pcp  CODE STATUS:     Code Status Orders        Start     Ordered   07/19/16 0124  Full code  Continuous     07/19/16 0123    Code Status History    Date Active Date Inactive Code Status Order ID Comments User Context   07/19/2016 12:31 AM 07/19/2016  1:23 AM DNR [id number]  Emily Bridge, DO ED   06/24/2016  4:07 PM 06/26/2016  4:49 PM DNR [id number]  Hessie Knows, MD Inpatient   06/21/2016  8:29 PM 06/22/2016  9:00 AM DNR [id number]  Vaughan Basta, MD Inpatient    Advance Directive Documentation   Flowsheet Row Most Recent Value  Type of Advance Directive  Healthcare Power of Attorney  Pre-existing out of facility DNR order (yellow form or pink MOST form)  Yellow form placed in chart (order not valid for inpatient use)  "MOST" Form in Place?  No data      TOTAL TIME TAKING CARE OF THIS PATIENT: 36 minutes.    Note: This dictation was prepared with Dragon dictation along with smaller phrase technology. Any transcriptional errors that result from this process are unintentional.  Kiesha Ensey M.D on 07/21/2016 at 10:33 AM  Between 7am to 6pm - Pager - [phone] After 6pm go to [email or web address] - password [email or web address] [facility]  Office  [phone]  CC: Primary care physician; Emily Huh, MD

## 2016-07-21 NOTE — Clinical Social Work Note (Signed)
CSW informed by Florentina Jenny that paperwork has been completed with patient's son and we can send patient. CSW confirmed with Mr. Rutten wife and Mr. Bittle that they wish for patient to transfer to [facility] this afternoon. Mr. Shorb has requested that the nurse review patient's discharge medications with him prior to patient leaving today. CSW has informed patient's nurse, Beth, of this. CSW has also requested that patient's nurse call patient's son when EMS has arrived to transport. Shela Leff MSW,LCSW [phone]

## 2016-07-21 NOTE — Clinical Social Work Placement (Signed)
   CLINICAL SOCIAL WORK PLACEMENT  NOTE  Date:  07/21/2016  Patient Details  Name: Emily [name] MRN: [id number] Date of Birth: May 23, 1933  Clinical Social Work is seeking post-discharge placement for this patient at the [place] level of care (*CSW will initial, date and re-position this form in  chart as items are completed):  Yes   Patient/family provided with [place] Work Department's list of facilities offering this level of care within the geographic area requested by the patient (or if unable, by the patient's family).  Yes   Patient/family informed of their freedom to choose among providers that offer the needed level of care, that participate in Medicare, Medicaid or managed care program needed by the patient, have an available bed and are willing to accept the patient.  Yes   Patient/family informed of [place]'s ownership interest in [facility] and [facility], as well as of the fact that they are under no obligation to receive care at these facilities.  PASRR submitted to EDS on       PASRR number received on       Existing PASRR number confirmed on 07/19/16     FL2 transmitted to all facilities in geographic area requested by pt/family on 07/21/16     FL2 transmitted to all facilities within larger geographic area on       Patient informed that his/her managed care company has contracts with or will negotiate with certain facilities, including the following:        Yes   Patient/family informed of bed offers received.  Patient chooses bed at  [facility])     Physician recommends and patient chooses bed at  [facility])    Patient to be transferred to  [facility]) on 07/21/16.  Patient to be transferred to facility by  (EMS)     Patient family notified on 07/21/16 of transfer.  Name of family member notified:   (patient's son: Mr. Fliegel)     PHYSICIAN       Additional Comment:     _______________________________________________ Shela Leff, LCSW 07/21/2016, 2:11 PM

## 2016-07-21 NOTE — Progress Notes (Addendum)
Spoke with patient's son at his request in detail regarding discharge medications.  Offered to son to call him once patient leaves by EMS.  Son declined phone call but asked that I have [place] call him when she arrives.  Report called to [place], RN, at [facility] and requested that she contact son upon patient's arrival.

## 2016-07-22 ENCOUNTER — Non-Acute Institutional Stay (SKILLED_NURSING_FACILITY): Payer: Medicare Other | Admitting: Internal Medicine

## 2016-07-22 ENCOUNTER — Encounter: Payer: Self-pay | Admitting: Internal Medicine

## 2016-07-22 DIAGNOSIS — B379 Candidiasis, unspecified: Secondary | ICD-10-CM | POA: Diagnosis not present

## 2016-07-22 DIAGNOSIS — F418 Other specified anxiety disorders: Secondary | ICD-10-CM

## 2016-07-22 DIAGNOSIS — D638 Anemia in other chronic diseases classified elsewhere: Secondary | ICD-10-CM | POA: Diagnosis not present

## 2016-07-22 DIAGNOSIS — R531 Weakness: Secondary | ICD-10-CM | POA: Diagnosis not present

## 2016-07-22 DIAGNOSIS — S72001S Fracture of unspecified part of neck of right femur, sequela: Secondary | ICD-10-CM | POA: Diagnosis not present

## 2016-07-22 DIAGNOSIS — K274 Chronic or unspecified peptic ulcer, site unspecified, with hemorrhage: Secondary | ICD-10-CM | POA: Diagnosis not present

## 2016-07-22 DIAGNOSIS — E46 Unspecified protein-calorie malnutrition: Secondary | ICD-10-CM

## 2016-07-22 DIAGNOSIS — I10 Essential (primary) hypertension: Secondary | ICD-10-CM

## 2016-07-22 DIAGNOSIS — R05 Cough: Secondary | ICD-10-CM

## 2016-07-22 DIAGNOSIS — K5900 Constipation, unspecified: Secondary | ICD-10-CM | POA: Diagnosis not present

## 2016-07-22 DIAGNOSIS — R059 Cough, unspecified: Secondary | ICD-10-CM

## 2016-07-22 NOTE — Progress Notes (Signed)
LOCATION:  [place]  PCP: Lelon Huh, MD   Code Status: DNR  Goals of care: Advanced Directive information Advanced Directives 07/22/2016  Does patient have an advance directive? Yes  Type of Advance Directive Out of facility DNR (pink MOST or yellow form)  Does patient want to make changes to advanced directive? No - Patient declined  Copy of advanced directive(s) in chart? Yes  Would patient like information on creating an advanced directive? -  Some encounter information is confidential and restricted. Go to Review Flowsheets activity to see all data.       Extended Emergency Contact Information Primary Emergency Contact: Osten,[name] Address: [employer] [place], [place] [address] Johnnette Litter of [place] Phone: [phone] Work Phone: [phone] Mobile Phone: [phone] Relation: Son Secondary Emergency Contact: Laurell Roof [place] of [place] Phone: [phone] Mobile Phone: [phone] Relation: Daughter   Allergies  Allergen Reactions  . Amlodipine Besylate Other (See Comments)    Patient unaware of any allergy with this medicine  . Oxycodone     confusion    Chief Complaint  Patient presents with  . New Admit To SNF    New Admission Visit     HPI:  Patient is a 82 y.o. female seen today for short term rehabilitation post hospital admission from 07/18/16-07/21/16 with anemia, possible gi bleed, generalized weakness. She was placed on iv protonix and GI was consulted and EGD was deferred as it was thought to be unrevealing. Therapy team evaluated patient and STR was recommended. She was noted to have erythema around peg site and culture was positive for yeast. She was placed on nystatin powder. She has PMH of HTN, depression, colon and throat cancer s/p peg tube. Of note, she had hip fracture surgical repair on 06/24/16. During that admission, she had received prbc transfusion.   Review of Systems:  Constitutional: Negative  for fever, chills. Positive for malaise.  HENT: Negative for headache, congestion, nasal discharge, difficulty swallowing.   Eyes: Negative for double vision and discharge.  Respiratory: Negative for cough, shortness of breath and wheezing.   Cardiovascular: Negative for chest pain, palpitations, leg swelling.  Gastrointestinal: Negative for heartburn, nausea, vomiting, abdominal pain. Had bowel movement yesterday. Has poor appetite.  Genitourinary: Negative for dysuria and flank pain.  Musculoskeletal: Negative for back pain, fall.  Skin: Negative for itching, rash.  Neurological: Negative for dizziness. Psychiatric/Behavioral: Negative for depression. Per hospital record, has memory loss.   Past Medical History:  Diagnosis Date  . Arthritis    fingers  . Cancer ([place])    rectal  . Cancer ([place]) 2012   colon cancer  . Cancer of contiguous sites of hypopharynx ([place]) 12/23/2015  . Colon cancer ([place])   . Difficult intubation   . Diverticulosis   . Hemorrhoids   . History of chicken pox   . Hypertension   . Osteopenia   . Status post chemotherapy    colon cancer  . Status post radiation therapy    colon cancer 2013   Past Surgical History:  Procedure Laterality Date  . ABDOMINAL HYSTERECTOMY    . ABDOMINAL HYSTERECTOMY  1980   partial  . APPENDECTOMY    . APPENDECTOMY  [id number]   Dr. Pat Patrick  . BREAST CYST ASPIRATION Bilateral    negative  . BREAST SURGERY     cyst removal  . BREAST SURGERY  1960   Breast Biopsy  . CARDIAC CATHETERIZATION  N/A 06/23/2016   Procedure: Left Heart Cath and Coronary Angiography;  Surgeon: Corey Skains, MD;  Location: [facility] LAB;  Service: Cardiovascular;  Laterality: N/A;  . COLON SURGERY  06/28/2013   resected tumor from colon; [facility]  . COLONOSCOPY    . COLONOSCOPY N/A 10/01/2015   Procedure: COLONOSCOPY;  Surgeon: [name], MD;  Location: [facility] ENDOSCOPY;  Service: Endoscopy;  Laterality: N/A;  .  DILATION AND CURETTAGE OF UTERUS  1957  . DIRECT LARYNGOSCOPY N/A 12/11/2015   Procedure: DIRECT LARYNGOSCOPY;  Surgeon: Clyde Canterbury, MD;  Location: [facility] ORS;  Service: ENT;  Laterality: N/A;  . ESOPHAGOSCOPY N/A 12/11/2015   Procedure: ESOPHAGOSCOPY;  Surgeon: Clyde Canterbury, MD;  Location: [facility] ORS;  Service: ENT;  Laterality: N/A;  . EUS N/A 01/20/2013   Procedure: LOWER ENDOSCOPIC ULTRASOUND (EUS);  Surgeon: Milus Banister, MD;  Location: [facility]OS[facility];  Service: Endoscopy;  Laterality: N/A;  . PEG PLACEMENT N/A 01/01/2016   Procedure: PERCUTANEOUS ENDOSCOPIC GASTROSTOMY (PEG) PLACEMENT;  Surgeon: [name], MD;  Location: [facility] ORS;  Service: General;  Laterality: N/A;  . PORTACATH PLACEMENT Right 01/01/2016   Procedure: INSERTION PORT-A-CATH;  Surgeon: [name], MD;  Location: [facility] ORS;  Service: General;  Laterality: Right;  . TONSILLECTOMY    . TOTAL HIP ARTHROPLASTY Right 06/24/2016   Procedure: TOTAL HIP ARTHROPLASTY ANTERIOR APPROACH;  Surgeon: Hessie Knows, MD;  Location: [facility] ORS;  Service: Orthopedics;  Laterality: Right;   Social History:   reports that she has been smoking Cigarettes.  She has a 64.00 pack-year smoking history. She has never used smokeless tobacco. She reports that she drinks alcohol. She reports that she does not use drugs.  Family History  Problem Relation Age of Onset  . Kidney failure Father   . Breast cancer Maternal Grandmother     Medications:   Medication List       Accurate as of 07/22/16  2:38 PM. Always use your most recent med list.          cyanocobalamin 1000 MCG tablet Take 1,000 mcg by mouth daily.   docusate sodium 100 MG capsule Commonly known as:  COLACE Take 1 capsule (100 mg total) by mouth 2 (two) times daily.   feeding supplement (JEVITY 1.5 CAL/FIBER) Liqd Place 1,000 mLs into feeding tube at bedtime. Over 10 hours at night   feeding supplement (PRO-STAT SUGAR FREE 64) Liqd Place 30 mLs into feeding tube 2 (two)  times daily.   FLUoxetine 10 MG tablet Commonly known as:  PROZAC Take 1 tablet (10 mg total) by mouth daily.   guaiFENesin 100 MG/5ML Soln Commonly known as:  ROBITUSSIN Take 10 mLs by mouth every 4 (four) hours as needed for cough or to loosen phlegm.   HYDROcodone-acetaminophen 5-325 MG tablet Commonly known as:  NORCO/VICODIN Take 1-2 tablets by mouth every 4 (four) hours as needed for moderate pain.   lactulose 10 GM/15ML solution Commonly known as:  CHRONULAC Take 30 mLs (20 g total) by mouth 2 (two) times daily as needed for moderate constipation or severe constipation.   megestrol 40 MG tablet Commonly known as:  MEGACE Take 1 tablet (40 mg total) by mouth 2 (two) times daily.   metoprolol succinate 25 MG 24 hr tablet Commonly known as:  TOPROL-XL Take 0.5 tablets (12.5 mg total) by mouth daily.   nystatin cream Commonly known as:  MYCOSTATIN Apply topically 2 (two) times daily.   pantoprazole 40 MG  tablet Commonly known as:  PROTONIX Take 1 tablet (40 mg total) by mouth daily.   ramipril 2.5 MG capsule Commonly known as:  ALTACE Take 1 capsule (2.5 mg total) by mouth at bedtime.   Vitamin D-3 1000 units Caps Take 2,000 Units by mouth daily.       Immunizations: Immunization History  Administered Date(s) Administered  . Influenza,inj,Quad PF,36+ Mos 08/15/2015, 07/20/2016  . Pneumococcal Polysaccharide-23 11/03/1999, 11/02/2005  . Zoster 04/10/2014     Physical Exam:  Vitals:   07/22/16 1432  BP: (!) 136/59  Pulse: 60  Resp: 18  Temp: 98.1 F (36.7 C)  TempSrc: Oral  SpO2: 98%    General- elderly female, frail and thin built, in no acute distress Head- normocephalic, atraumatic, temporal muscle wasting Nose- no maxillary or frontal sinus tenderness, no nasal discharge Throat- moist mucus membrane Eyes- PERRLA, EOMI, no pallor, no icterus, no discharge, normal conjunctiva, normal sclera, wears glasses Neck- no cervical  lymphadenopathy Cardiovascular- normal s1,s2, no murmur, no leg edema Respiratory- bilateral clear to auscultation, no wheeze, no rhonchi, no crackles, no use of accessory muscles Abdomen- bowel sounds present, soft, non tender, peg tube in place with redness around peg tube site  Musculoskeletal- able to move all 4 extremities, generalized weakness Neurological- alert and oriented to person, place and time but slow to answer questions Skin- warm and dry Psychiatry- normal mood and affect    Labs reviewed: Basic Metabolic Panel:  Recent Labs  06/26/16 0419 06/27/16 0515 07/18/16 2211 07/19/16 0514  NA 138 138 138 141  K 2.9* 4.0 4.3 3.8  CL 114* 110 109 115*  CO2 19* 24 25 22   GLUCOSE 108* 99 96 96  BUN 31* 23* 37* 30*  CREATININE 0.99 1.08* 0.90 0.84  CALCIUM 7.6* 8.1* 10.2 9.6  MG 1.6* 1.9  --  1.8  PHOS  --   --   --  2.9   Liver Function Tests:  Recent Labs  04/15/16 1336 05/19/16 1115 06/21/16 1607 07/20/16 0813  AST 23 22 76*  --   ALT 12* 11* 38  --   ALKPHOS 66 70 61  --   BILITOT 0.6 0.6 1.1  --   PROT 7.2 6.7 7.0  --   ALBUMIN 4.1 3.8 3.8 2.6*   No results for input(s): LIPASE, AMYLASE in the last 8760 hours. No results for input(s): AMMONIA in the last 8760 hours. CBC:  Recent Labs  04/15/16 1336 05/19/16 1115  07/18/16 2211 07/19/16 0514 07/20/16 0813 07/21/16 0526 07/21/16 0957  WBC 7.2 5.3  < > 6.6 5.7 6.1  --   --   NEUTROABS 5.5 3.6  --  5.0  --   --   --   --   HGB 13.1 13.1  < > 8.8* 8.6* 8.7* 7.8* 8.4*  HCT 38.2 38.5  < > 25.4* 25.0* 25.5*  --   --   MCV 93.6 93.9  < > 92.8 94.2 94.3  --   --   PLT 245 190  < > 344 312 289  --   --   < > = values in this interval not displayed. Cardiac Enzymes:  Recent Labs  06/21/16 1607  06/21/16 2254 06/22/16 0238 07/18/16 2211  CKTOTAL 767*  --   --   --   --   TROPONINI 2.88*  < > 2.83* 2.63* <0.03  < > = values in this interval not displayed. BNP: Invalid input(s):  POCBNP CBG:  Recent Labs  12/19/15 1125 05/14/16 1136  GLUCAP 105* 104*    Radiological Exams: Dg Chest 2 View  Result Date: 07/18/2016 CLINICAL DATA:  Altered mental status.  Evaluate for pneumonia. EXAM: CHEST  2 VIEW COMPARISON:  Radiograph 06/21/2016 FINDINGS: Tip of the right chest port remains in the SVC. Unchanged cardiomediastinal contours with aortic atherosclerosis. No focal airspace disease to suggest pneumonia. The lungs remain hyperinflated. No pulmonary edema, pleural fluid or pneumothorax. Remote bilateral rib fractures are unchanged. IMPRESSION: No evidence of pneumonia.  No acute abnormality. Thoracic aortic atherosclerosis. Electronically Signed   By: Jeb Levering M.D.   On: 07/18/2016 22:59   Dg Hip Operative Unilat W Or W/o Pelvis Right  Result Date: 06/24/2016 CLINICAL DATA:  Right hip replacement EXAM: OPERATIVE RIGHT HIP WITH PELVIS COMPARISON:  None. FLUOROSCOPY TIME:  Radiation Exposure Index (as provided by the fluoroscopic device): 2 mGy If the device does not provide the exposure index: Fluoroscopy Time:  12 seconds Number of Acquired Images:  2 FINDINGS: A right hip replacement is seen. No acute bony or soft tissue abnormality is noted. IMPRESSION: Status post right hip replacement Electronically Signed   By: Inez Catalina M.D.   On: 06/24/2016 13:18   Dg Hip Unilat With Pelvis 2-3 Views Right  Result Date: 07/18/2016 CLINICAL DATA:  Continued right hip pain after surgery. No recent injury. EXAM: DG HIP (WITH OR WITHOUT PELVIS) 2-3V RIGHT COMPARISON:  Postoperative radiograph 06/24/2016 FINDINGS: Right hip arthroplasty in expected alignment. No periprosthetic lucency. No periprosthetic fracture. The remainder the bony pelvis is intact. No additional or acute fracture. Pubic symphysis and sacroiliac joints are congruent. Vascular calcifications. A safety pin projecting over the a central pelvis is presumably external to the patient. Gastrostomy tube is incidentally  noted in the upper abdomen. IMPRESSION: Expected postoperative appearance of the right hip arthroplasty. No acute osseous abnormality. A safety pin projecting over the central pelvis is presumably external to the patient, recommend correlation for external artifact. Electronically Signed   By: Jeb Levering M.D.   On: 07/18/2016 22:57   Dg Hip Unilat W Or W/o Pelvis 2-3 Views Right  Result Date: 06/24/2016 CLINICAL DATA:  ORIF for right femoral neck fracture. EXAM: DG HIP (WITH OR WITHOUT PELVIS) 2-3V RIGHT COMPARISON:  06/21/2016. FINDINGS: Portable AP and cross-table lateral view of the right hip shows the patient be status post hip replacement. No evidence for immediate hardware complications. Skin staples are seen over the lateral hip. IMPRESSION: Status post right hip replacement for femoral neck fracture. No evidence for immediate hardware complications. Electronically Signed   By: Misty Stanley M.D.   On: 06/24/2016 13:55    Assessment/Plan  Generalized weakness Will have patient work with PT/OT as tolerated to regain strength and restore function.  Fall precautions are in place.  Gi bleed Contributing to her anemia. Monitor cbc. EGD and colonoscopy deferred this admission visit. Continue protonix 40 mg daily.   Anemia of chronic disease No active bleed reported. Monitor cbc  Protein calorie malnutrition Monitor weekly weight, get RD to evaluate. Continue megace bid and feeding supplement  Candidal rash Around peg site, continue skin care and nystatin cream  Dysphagia Continue peg tube feed and feeding by mouth, assistance to be provided with po feed. SLP to follow. Aspiration precautions  Right hip fracture S/p surgical repair on 06/24/16. Continue vitamin d supplement. Continue norco 5-325 mg 1-2 tab q4h prn pain. Will have her work with physical therapy and occupational therapy team to help with gait  training and muscle strengthening exercises.fall precautions. Skin care.  Encourage to be out of bed. Orthopedic follow up.  Constipation On colace 100 mg bid with lactulose on need basis.   Cough Monitor clinically, continue her robitussin as needed. Aspiration precautions  Depression and anxiety Continue her prozac  HTN Monitor BP, continue ramipril 2.5 mg qd with toprol xl 12.5 mg daily   Goals of care: short term rehabilitation   Labs/tests ordered: cbc, cmp 07/27/16  Family/ staff Communication: reviewed care plan with patient and nursing supervisor    Blanchie Serve, MD Internal Medicine [place], [place] [address] Cell Phone (Monday-Friday 8 am - 5 pm): [phone] On Call: [phone] and follow prompts after 5 pm and on weekends Office Phone: [phone] Office Fax: [phone]

## 2016-07-23 LAB — CBC AND DIFFERENTIAL
HCT: 25 % — AB (ref 36–46)
Hemoglobin: 8.1 g/dL — AB (ref 12.0–16.0)
Platelets: 309 10*3/uL (ref 150–399)
WBC: 5.2 10^3/mL

## 2016-07-23 LAB — HEPATIC FUNCTION PANEL
ALK PHOS: 81 U/L (ref 25–125)
ALT: 12 U/L (ref 7–35)
AST: 10 U/L — AB (ref 13–35)
Bilirubin, Total: 0.2 mg/dL

## 2016-07-23 LAB — BASIC METABOLIC PANEL
BUN: 33 mg/dL — AB (ref 4–21)
CREATININE: 1 mg/dL (ref 0.5–1.1)
Glucose: 116 mg/dL
POTASSIUM: 4 mmol/L (ref 3.4–5.3)
SODIUM: 143 mmol/L (ref 137–147)

## 2016-07-24 ENCOUNTER — Encounter: Payer: Self-pay | Admitting: Internal Medicine

## 2016-07-24 ENCOUNTER — Non-Acute Institutional Stay (SKILLED_NURSING_FACILITY): Payer: Medicare Other | Admitting: Internal Medicine

## 2016-07-24 DIAGNOSIS — D649 Anemia, unspecified: Secondary | ICD-10-CM

## 2016-07-24 DIAGNOSIS — E440 Moderate protein-calorie malnutrition: Secondary | ICD-10-CM | POA: Diagnosis not present

## 2016-07-24 DIAGNOSIS — K922 Gastrointestinal hemorrhage, unspecified: Secondary | ICD-10-CM

## 2016-07-24 NOTE — Progress Notes (Signed)
LOCATION:  [place]  PCP: Lelon Huh, MD   Code Status: DNR  Goals of care: Advanced Directive information Advanced Directives 07/22/2016  Does patient have an advance directive? Yes  Type of Advance Directive Out of facility DNR (pink MOST or yellow form)  Does patient want to make changes to advanced directive? No - Patient declined  Copy of advanced directive(s) in chart? Yes  Would patient like information on creating an advanced directive? -  Some encounter information is confidential and restricted. Go to Review Flowsheets activity to see all data.       Extended Emergency Contact Information Primary Emergency Contact: Sinnett,[name] Address: [place], [place] [address] Johnnette Litter of [place] Phone: [phone] Work Phone: [phone] Mobile Phone: [phone] Relation: Son Secondary Emergency Contact: Laurell Roof [place] of [place] Phone: [phone] Mobile Phone: [phone] Relation: Daughter   Allergies  Allergen Reactions  . Amlodipine Besylate Other (See Comments)    Patient unaware of any allergy with this medicine  . Oxycodone     confusion    Chief Complaint  Patient presents with  . Acute Visit    Low hemoglobin and albumin     HPI:  Patient is a 82 y.o. female seen today for acute visit. Labs on review have drop in hemoglobin and low albumin level. She is here for short term rehabilitation post hospital admission from 07/18/16-07/21/16 with anemia and possible gi bleed. She received prbc transfusion in hospital. EGD was deferred in the hospital as it was thought to be unrevealing.   Review of Systems:  Constitutional: Negative for fever Eyes: Negative for double vision and discharge.  Respiratory: Negative for shortness of breath  Cardiovascular: Negative for chest pain, palpitations Gastrointestinal: Negative for heartburn, nausea, vomiting, abdominal pain. Denies blood in stool  Genitourinary:  Negative for dysuria  Musculoskeletal: Negative for fall.  Skin: Negative for itching, rash.  Neurological: Negative for dizziness.   Past Medical History:  Diagnosis Date  . Arthritis    fingers  . Cancer ([place])    rectal  . Cancer ([place]) 2012   colon cancer  . Cancer of contiguous sites of hypopharynx ([place]) 12/23/2015  . Colon cancer ([place])   . Difficult intubation   . Diverticulosis   . Hemorrhoids   . History of chicken pox   . Hypertension   . Osteopenia   . Status post chemotherapy    colon cancer  . Status post radiation therapy    colon cancer 2013   Past Surgical History:  Procedure Laterality Date  . ABDOMINAL HYSTERECTOMY    . ABDOMINAL HYSTERECTOMY  1980   partial  . APPENDECTOMY    . APPENDECTOMY  [id number]   Dr. Pat Patrick  . BREAST CYST ASPIRATION Bilateral    negative  . BREAST SURGERY     cyst removal  . BREAST SURGERY  1960   Breast Biopsy  . CARDIAC CATHETERIZATION N/A 06/23/2016   Procedure: Left Heart Cath and Coronary Angiography;  Surgeon: Corey Skains, MD;  Location: [place] [facility];  Service: Cardiovascular;  Laterality: N/A;  . COLON SURGERY  06/28/2013   resected tumor from colon; [facility]  . COLONOSCOPY    . COLONOSCOPY N/A 10/01/2015   Procedure: COLONOSCOPY;  Surgeon: [name], MD;  Location: [facility];  Service: Endoscopy;  Laterality: N/A;  . DILATION AND CURETTAGE OF UTERUS  1957  . DIRECT LARYNGOSCOPY  N/A 12/11/2015   Procedure: DIRECT LARYNGOSCOPY;  Surgeon: Clyde Canterbury, MD;  Location: [facility] ORS;  Service: ENT;  Laterality: N/A;  . ESOPHAGOSCOPY N/A 12/11/2015   Procedure: ESOPHAGOSCOPY;  Surgeon: Clyde Canterbury, MD;  Location: [facility] ORS;  Service: ENT;  Laterality: N/A;  . EUS N/A 01/20/2013   Procedure: LOWER ENDOSCOPIC ULTRASOUND (EUS);  Surgeon: Milus Banister, MD;  Location: [facility];  Service: Endoscopy;  Laterality: N/A;  . PEG PLACEMENT N/A 01/01/2016   Procedure: PERCUTANEOUS ENDOSCOPIC  GASTROSTOMY (PEG) PLACEMENT;  Surgeon: [name], MD;  Location: [facility] ORS;  Service: General;  Laterality: N/A;  . PORTACATH PLACEMENT Right 01/01/2016   Procedure: INSERTION PORT-A-CATH;  Surgeon: [name], MD;  Location: [facility] ORS;  Service: General;  Laterality: Right;  . TONSILLECTOMY    . TOTAL HIP ARTHROPLASTY Right 06/24/2016   Procedure: TOTAL HIP ARTHROPLASTY ANTERIOR APPROACH;  Surgeon: Hessie Knows, MD;  Location: [facility] ORS;  Service: Orthopedics;  Laterality: Right;    Medications:   Medication List       Accurate as of 07/24/16  2:17 PM. Always use your most recent med list.          cyanocobalamin 1000 MCG tablet Take 1,000 mcg by mouth daily.   docusate sodium 100 MG capsule Commonly known as:  COLACE Take 1 capsule (100 mg total) by mouth 2 (two) times daily.   feeding supplement (JEVITY 1.5 CAL/FIBER) Liqd Place 1,000 mLs into feeding tube at bedtime. Over 10 hours at night   feeding supplement (PRO-STAT SUGAR FREE 64) Liqd Place 30 mLs into feeding tube 2 (two) times daily.   FLUoxetine 10 MG tablet Commonly known as:  PROZAC Take 1 tablet (10 mg total) by mouth daily.   guaiFENesin 100 MG/5ML Soln Commonly known as:  ROBITUSSIN Take 10 mLs by mouth every 4 (four) hours as needed for cough or to loosen phlegm.   HYDROcodone-acetaminophen 5-325 MG tablet Commonly known as:  NORCO/VICODIN Take 1-2 tablets by mouth every 4 (four) hours as needed for moderate pain.   lactulose 10 GM/15ML solution Commonly known as:  CHRONULAC Take 30 mLs (20 g total) by mouth 2 (two) times daily as needed for moderate constipation or severe constipation.   megestrol 40 MG tablet Commonly known as:  MEGACE Take 1 tablet (40 mg total) by mouth 2 (two) times daily.   metoprolol succinate 25 MG 24 hr tablet Commonly known as:  TOPROL-XL Take 0.5 tablets (12.5 mg total) by mouth daily.   nystatin cream Commonly known as:  MYCOSTATIN Apply topically 2 (two)  times daily.   pantoprazole 40 MG tablet Commonly known as:  PROTONIX Take 1 tablet (40 mg total) by mouth daily.   ramipril 2.5 MG capsule Commonly known as:  ALTACE Take 1 capsule (2.5 mg total) by mouth at bedtime.   Vitamin D-3 1000 units Caps Take 2,000 Units by mouth daily.       Immunizations: Immunization History  Administered Date(s) Administered  . Influenza,inj,Quad PF,36+ Mos 08/15/2015, 07/20/2016  . Pneumococcal Polysaccharide-23 11/03/1999, 11/02/2005  . Zoster 04/10/2014     Physical Exam:  Vitals:   07/24/16 1408  BP: (!) 142/57  Pulse: 70  Temp: 98.6 F (37 C)  TempSrc: Oral  SpO2: 99%    General- elderly female, frail and thin built, in no acute distress Head- normocephalic, atraumatic, temporal muscle wasting Throat- moist mucus membrane Eyes- PERRLA, EOMI, no pallor, no icterus Neck- no cervical lymphadenopathy Cardiovascular- normal s1,s2, no murmur Respiratory-  bilateral clear to auscultation Abdomen- bowel sounds present, soft, non tender, peg tube in place with redness around peg tube site  Musculoskeletal- able to move all 4 extremities, generalized weakness Neurological- alert and oriented Skin- warm and dry Psychiatry- normal mood and affect    Labs reviewed: Basic Metabolic Panel:  Recent Labs  06/26/16 0419 06/27/16 0515 07/18/16 2211 07/19/16 0514 07/23/16  NA 138 138 138 141 143  K 2.9* 4.0 4.3 3.8 4.0  CL 114* 110 109 115*  --   CO2 19* 24 25 22   --   GLUCOSE 108* 99 96 96  --   BUN 31* 23* 37* 30* 33*  CREATININE 0.99 1.08* 0.90 0.84 1.0  CALCIUM 7.6* 8.1* 10.2 9.6  --   MG 1.6* 1.9  --  1.8  --   PHOS  --   --   --  2.9  --    Liver Function Tests:  Recent Labs  04/15/16 1336 05/19/16 1115 06/21/16 1607 07/20/16 0813 07/23/16  AST 23 22 76*  --  10*  ALT 12* 11* 38  --  12  ALKPHOS 66 70 61  --  81  BILITOT 0.6 0.6 1.1  --   --   PROT 7.2 6.7 7.0  --   --   ALBUMIN 4.1 3.8 3.8 2.6*  --    No  results for input(s): LIPASE, AMYLASE in the last 8760 hours. No results for input(s): AMMONIA in the last 8760 hours. CBC:  Recent Labs  04/15/16 1336 05/19/16 1115  07/18/16 2211 07/19/16 0514 07/20/16 0813 07/21/16 0526 07/21/16 0957 07/23/16  WBC 7.2 5.3  < > 6.6 5.7 6.1  --   --  5.2  NEUTROABS 5.5 3.6  --  5.0  --   --   --   --   --   HGB 13.1 13.1  < > 8.8* 8.6* 8.7* 7.8* 8.4* 8.1*  HCT 38.2 38.5  < > 25.4* 25.0* 25.5*  --   --  25*  MCV 93.6 93.9  < > 92.8 94.2 94.3  --   --   --   PLT 245 190  < > 344 312 289  --   --  309  < > = values in this interval not displayed. Cardiac Enzymes:  Recent Labs  06/21/16 1607  06/21/16 2254 06/22/16 0238 07/18/16 2211  CKTOTAL 767*  --   --   --   --   TROPONINI 2.88*  < > 2.83* 2.63* <0.03  < > = values in this interval not displayed. BNP: Invalid input(s): POCBNP CBG:  Recent Labs  12/19/15 1125 05/14/16 1136  GLUCAP 105* 104*    Radiological Exams: Dg Chest 2 View  Result Date: 07/18/2016 CLINICAL DATA:  Altered mental status.  Evaluate for pneumonia. EXAM: CHEST  2 VIEW COMPARISON:  Radiograph 06/21/2016 FINDINGS: Tip of the right chest port remains in the SVC. Unchanged cardiomediastinal contours with aortic atherosclerosis. No focal airspace disease to suggest pneumonia. The lungs remain hyperinflated. No pulmonary edema, pleural fluid or pneumothorax. Remote bilateral rib fractures are unchanged. IMPRESSION: No evidence of pneumonia.  No acute abnormality. Thoracic aortic atherosclerosis. Electronically Signed   By: Jeb Levering M.D.   On: 07/18/2016 22:59   Dg Hip Unilat With Pelvis 2-3 Views Right  Result Date: 07/18/2016 CLINICAL DATA:  Continued right hip pain after surgery. No recent injury. EXAM: DG HIP (WITH OR WITHOUT PELVIS) 2-3V RIGHT COMPARISON:  Postoperative radiograph 06/24/2016 FINDINGS: Right hip  arthroplasty in expected alignment. No periprosthetic lucency. No periprosthetic fracture. The  remainder the bony pelvis is intact. No additional or acute fracture. Pubic symphysis and sacroiliac joints are congruent. Vascular calcifications. A safety pin projecting over the a central pelvis is presumably external to the patient. Gastrostomy tube is incidentally noted in the upper abdomen. IMPRESSION: Expected postoperative appearance of the right hip arthroplasty. No acute osseous abnormality. A safety pin projecting over the central pelvis is presumably external to the patient, recommend correlation for external artifact. Electronically Signed   By: Jeb Levering M.D.   On: 07/18/2016 22:57    Assessment/Plan  Anemia unspecified Possible gi bleed. S/p prbc transfusion in hospital. Has drop in Hb level on recheck. Start feso4 325 mg bid and check cbc in 1 week. Monitor vital signs. FOBT X 3.  Gi bleed Check FOBT X 3. Increase protonix to 40 mg bid. Monitor cbc and for signs of active bleed.   protein calorie malnutrition Low albumin. RD consult. Continue megace and feeding supplement   Labs/tests ordered: cbc 07/30/16  Family/ staff Communication: reviewed care plan with patient and nursing supervisor    Blanchie Serve, MD Internal Medicine [place], [place] [address] Cell Phone (Monday-Friday 8 am - 5 pm): [phone] On Call: [phone] and follow prompts after 5 pm and on weekends Office Phone: [phone] Office Fax: [phone]

## 2016-07-28 ENCOUNTER — Non-Acute Institutional Stay (SKILLED_NURSING_FACILITY): Payer: Medicare Other | Admitting: Family

## 2016-07-28 DIAGNOSIS — R197 Diarrhea, unspecified: Secondary | ICD-10-CM

## 2016-07-28 DIAGNOSIS — M79651 Pain in right thigh: Secondary | ICD-10-CM | POA: Diagnosis not present

## 2016-07-28 NOTE — Progress Notes (Signed)
Location:  [facility] Room Number: 1203 P  Place of Service:  SNF [phone]) Provider: Maxima Skelton FNP-C   Lelon Huh, MD  Patient Care Team: [name], MD as PCP - General (Family Medicine) [name], MD (General Surgery) [name], MD (Unknown Physician Specialty) [name], MD (Family Medicine) [name], MD (General Surgery)  Extended Emergency Contact Information Primary Emergency Contact: Haliburton,[name] Address: [address]          [place], [place] [address] Johnnette Litter of [place] Phone: [phone] Work Phone: [phone] Mobile Phone: [phone] Relation: Son Secondary Emergency Contact: Laurell Roof [place] of [place] Phone: [phone] Mobile Phone: [phone] Relation: Daughter  Code Status:  DNR  Goals of care: Advanced Directive information Advanced Directives 07/22/2016  Does patient have an advance directive? Yes  Type of Advance Directive Out of facility DNR (pink MOST or yellow form)  Does patient want to make changes to advanced directive? No - Patient declined  Copy of advanced directive(s) in chart? Yes  Would patient like information on creating an advanced directive? -  Some encounter information is confidential and restricted. Go to Review Flowsheets activity to see all data.     Chief Complaint  Patient presents with  . Acute Visit    Diarrhea    HPI:  Pt is a 82 y.o. female seen today at [facility] for an acute visit for diarrhea. She has a medical history of colon Cancer, right hip replacement, OA, Dysphagia among other conditions. She is seen in her room today per facility Nurse request who states patient has loose stool.Patient states had x 2 loose stool in the previous night but none prior to visit. Of note she is on PEG tube. Jevity 1.5 @ 70 mls/HR from 7Pm- 7AM.Facility Nurse states patient eats 25-50% of meals. She complains of right thigh  pain described as achy pain. She denies any injury to right thigh area. She states does not remember having any hip surgery though healed incision noted . She denies any fever or chills   Past Medical History:  Diagnosis Date  . Arthritis    fingers  . Cancer ([place])    rectal  . Cancer ([place]) 2012   colon cancer  . Cancer of contiguous sites of hypopharynx ([place]) 12/23/2015  . Colon cancer ([place])   . Difficult intubation   . Diverticulosis   . Hemorrhoids   . History of chicken pox   . Hypertension   . Osteopenia   . Status post chemotherapy    colon cancer  . Status post radiation therapy    colon cancer 2013   Past Surgical History:  Procedure Laterality Date  . ABDOMINAL HYSTERECTOMY    . ABDOMINAL HYSTERECTOMY  1980   partial  . APPENDECTOMY    . APPENDECTOMY  [id number]   Dr. Pat Patrick  . BREAST CYST ASPIRATION Bilateral    negative  . BREAST SURGERY     cyst removal  . BREAST SURGERY  1960   Breast Biopsy  . CARDIAC CATHETERIZATION N/A 06/23/2016   Procedure: Left Heart Cath and Coronary Angiography;  Surgeon: Corey Skains, MD;  Location: [place] CV LAB;  Service: Cardiovascular;  Laterality: N/A;  . COLON SURGERY  06/28/2013   resected tumor from colon; [facility]  . COLONOSCOPY    . COLONOSCOPY N/A 10/01/2015   Procedure: COLONOSCOPY;  Surgeon: [name], MD;  Location:  [facility];  Service: Endoscopy;  Laterality: N/A;  . DILATION AND CURETTAGE OF UTERUS  1957  . DIRECT LARYNGOSCOPY N/A 12/11/2015   Procedure: DIRECT LARYNGOSCOPY;  Surgeon: Clyde Canterbury, MD;  Location: [facility] ORS;  Service: ENT;  Laterality: N/A;  . ESOPHAGOSCOPY N/A 12/11/2015   Procedure: ESOPHAGOSCOPY;  Surgeon: Clyde Canterbury, MD;  Location: [facility] ORS;  Service: ENT;  Laterality: N/A;  . EUS N/A 01/20/2013   Procedure: LOWER ENDOSCOPIC ULTRASOUND (EUS);  Surgeon: Milus Banister, MD;  Location: [facility];  Service: Endoscopy;  Laterality: N/A;  . PEG PLACEMENT N/A  01/01/2016   Procedure: PERCUTANEOUS ENDOSCOPIC GASTROSTOMY (PEG) PLACEMENT;  Surgeon: [name], MD;  Location: [facility] ORS;  Service: General;  Laterality: N/A;  . PORTACATH PLACEMENT Right 01/01/2016   Procedure: INSERTION PORT-A-CATH;  Surgeon: [name], MD;  Location: [facility] ORS;  Service: General;  Laterality: Right;  . TONSILLECTOMY    . TOTAL HIP ARTHROPLASTY Right 06/24/2016   Procedure: TOTAL HIP ARTHROPLASTY ANTERIOR APPROACH;  Surgeon: Hessie Knows, MD;  Location: [facility] ORS;  Service: Orthopedics;  Laterality: Right;    Allergies  Allergen Reactions  . Amlodipine Besylate Other (See Comments)    Patient unaware of any allergy with this medicine  . Oxycodone     confusion      Medication List       Accurate as of 07/28/16  3:52 PM. Always use your most recent med list.          cyanocobalamin 1000 MCG tablet Take 1,000 mcg by mouth daily.   docusate sodium 100 MG capsule Commonly known as:  COLACE Take 1 capsule (100 mg total) by mouth 2 (two) times daily.   feeding supplement (JEVITY 1.5 CAL/FIBER) Liqd Place 1,000 mLs into feeding tube at bedtime. Over 10 hours at night   feeding supplement (PRO-STAT SUGAR FREE 64) Liqd Place 30 mLs into feeding tube 2 (two) times daily.   ferrous sulfate 325 (65 FE) MG tablet Take 325 mg by mouth 2 (two) times daily with a meal. Via PEG tube   FLUoxetine 10 MG tablet Commonly known as:  PROZAC Take 1 tablet (10 mg total) by mouth daily.   guaiFENesin 100 MG/5ML Soln Commonly known as:  ROBITUSSIN Take 10 mLs by mouth every 4 (four) hours as needed for cough or to loosen phlegm.   HYDROcodone-acetaminophen 5-325 MG tablet Commonly known as:  NORCO/VICODIN Take 1-2 tablets by mouth every 4 (four) hours as needed for moderate pain.   lactulose 10 GM/15ML solution Commonly known as:  CHRONULAC Take 30 mLs (20 g total) by mouth 2 (two) times daily as needed for moderate constipation or severe constipation.     megestrol 40 MG tablet Commonly known as:  MEGACE Take 1 tablet (40 mg total) by mouth 2 (two) times daily.   metoprolol succinate 25 MG 24 hr tablet Commonly known as:  TOPROL-XL Take 0.5 tablets (12.5 mg total) by mouth daily.   nystatin cream Commonly known as:  MYCOSTATIN Apply topically 2 (two) times daily.   pantoprazole 40 MG tablet Commonly known as:  PROTONIX Take 1 tablet (40 mg total) by mouth daily.   ramipril 2.5 MG capsule Commonly known as:  ALTACE Take 1 capsule (2.5 mg total) by mouth at bedtime.   Vitamin D-3 1000 units Caps Take 2,000 Units by mouth daily.       Review of Systems  Constitutional: Negative for activity change, appetite change, chills, fatigue and fever.  HENT: Negative  for congestion, rhinorrhea, sneezing and sore throat.   Eyes: Negative.   Respiratory: Negative for cough, chest tightness, shortness of breath and wheezing.   Cardiovascular: Negative for chest pain, palpitations and leg swelling.  Gastrointestinal: Negative for abdominal distention, abdominal pain, constipation, diarrhea, nausea and vomiting.       PEG tube. Jevity 1.5 @ 70 mls/HR from 7Pm- 7AM   Genitourinary: Negative for dysuria, flank pain, frequency and urgency.  Musculoskeletal: Positive for gait problem.       Right Thigh pain   Skin: Negative for color change, pallor, rash and wound.  Psychiatric/Behavioral: Negative for agitation, confusion, hallucinations and sleep disturbance.    Immunization History  Administered Date(s) Administered  . Influenza,inj,Quad PF,36+ Mos 08/15/2015, 07/20/2016  . Pneumococcal Polysaccharide-23 11/03/1999, 11/02/2005  . Zoster 04/10/2014   Pertinent  Health Maintenance Due  Topic Date Due  . PNA vac Low Risk Adult (2 of 2 - PCV13) 11/02/2006  . COLONOSCOPY  09/30/2018  . INFLUENZA VACCINE  Completed  . DEXA SCAN  Completed   Fall Risk  04/15/2016 12/30/2015  Falls in the past year? No No      Vitals:   07/28/16 1415   BP: (!) 146/62  Pulse: 72  Resp: 18  Temp: 98.9 F (37.2 C)  SpO2: 97%  Weight: 105 lb (47.6 kg)  Height: 5\' 5"  (1.651 m)   Body mass index is 17.47 kg/m. Physical Exam  Constitutional:  Thin Frail Elderly in no acute distress  HENT:  Head: Normocephalic.  Mouth/Throat: Oropharynx is clear and moist. No oropharyngeal exudate.  Eyes: Conjunctivae and EOM are normal. Pupils are equal, round, and reactive to light. Right eye exhibits no discharge. Left eye exhibits no discharge. No scleral icterus.  Neck: Normal range of motion. No JVD present. No thyromegaly present.  Cardiovascular: Normal rate, regular rhythm, normal heart sounds and intact distal pulses.  Exam reveals no gallop and no friction rub.   No murmur heard. Pulmonary/Chest: Effort normal and breath sounds normal. No respiratory distress. She has no wheezes. She has no rales.  Abdominal: Soft. Bowel sounds are normal. She exhibits no distension. There is no tenderness. There is no rebound and no guarding.  Musculoskeletal: She exhibits no edema, tenderness or deformity.  Unsteady gait. Bilateral Lower extremities decreased strength. Right thigh non-tender to palpation without any swelling or redness.   Lymphadenopathy:    She has no cervical adenopathy.  Neurological: She is alert.  Skin: Skin is warm and dry. No rash noted. No erythema. No pallor.  Right Thigh old incision healed surrounding skin without any signs of infections.   Psychiatric: She has a normal mood and affect.    Labs reviewed:  Recent Labs  06/26/16 0419 06/27/16 0515 07/18/16 2211 07/19/16 0514 07/23/16  NA 138 138 138 141 143  K 2.9* 4.0 4.3 3.8 4.0  CL 114* 110 109 115*  --   CO2 19* 24 25 22   --   GLUCOSE 108* 99 96 96  --   BUN 31* 23* 37* 30* 33*  CREATININE 0.99 1.08* 0.90 0.84 1.0  CALCIUM 7.6* 8.1* 10.2 9.6  --   MG 1.6* 1.9  --  1.8  --   PHOS  --   --   --  2.9  --     Recent Labs  04/15/16 1336 05/19/16 1115  06/21/16 1607 07/20/16 0813 07/23/16  AST 23 22 76*  --  10*  ALT 12* 11* 38  --  12  ALKPHOS 66 70 61  --  81  BILITOT 0.6 0.6 1.1  --   --   PROT 7.2 6.7 7.0  --   --   ALBUMIN 4.1 3.8 3.8 2.6*  --     Recent Labs  04/15/16 1336 05/19/16 1115  07/18/16 2211 07/19/16 0514 07/20/16 0813 07/21/16 0526 07/21/16 0957 07/23/16  WBC 7.2 5.3  < > 6.6 5.7 6.1  --   --  5.2  NEUTROABS 5.5 3.6  --  5.0  --   --   --   --   --   HGB 13.1 13.1  < > 8.8* 8.6* 8.7* 7.8* 8.4* 8.1*  HCT 38.2 38.5  < > 25.4* 25.0* 25.5*  --   --  25*  MCV 93.6 93.9  < > 92.8 94.2 94.3  --   --   --   PLT 245 190  < > 344 312 289  --   --  309  < > = values in this interval not displayed. Lab Results  Component Value Date   TSH 5.193 (H) 07/02/2016   No results found for: HGBA1C Lab Results  Component Value Date   CHOL 205 (H) 06/21/2016   HDL 60 06/21/2016   LDLCALC 120 (H) 06/21/2016   TRIG 123 06/21/2016   CHOLHDL 3.4 06/21/2016   Assessment/Plan 1. Diarrhea, unspecified type Afebrile. No Abdominal pain Nausea or vomiting X 2 loose stool reported. None prior to visit. Suspect possible from current Tube feeding. RD consult to evaluate tube feeding. Currently on Jevity 1.5 @ 70 cc/HR from 7PM-7Am. Eating 25-50 % of her meals. Continue Imodium PRN   2. Right thigh pain Pain described as Achy. Has had right hip replacement incision site without any redness, swelling tenderness or drainage. Add Tylenol 500 mg Tablet one by mouth every 8 HRS for pain. Obtain Portable X-ray of right Hip/Thigh  3 views rule out Fracture.     Family/ staff Communication: Reviewed plan of care with patient and facility Nurse supervisor.  Labs/tests ordered:  Portable X-ray of Hip/Thigh 3 views r/o FX

## 2016-07-30 LAB — CBC AND DIFFERENTIAL
HCT: 28 % — AB (ref 36–46)
HEMOGLOBIN: 9.1 g/dL — AB (ref 12.0–16.0)
Platelets: 296 10*3/uL (ref 150–399)
WBC: 7.5 10^3/mL

## 2016-08-05 DIAGNOSIS — Z96641 Presence of right artificial hip joint: Secondary | ICD-10-CM | POA: Diagnosis not present

## 2016-08-11 ENCOUNTER — Inpatient Hospital Stay: Payer: Medicare Other | Attending: Oncology

## 2016-08-11 ENCOUNTER — Ambulatory Visit: Payer: Medicare Other | Attending: Radiation Oncology | Admitting: Radiation Oncology

## 2016-08-19 ENCOUNTER — Inpatient Hospital Stay: Payer: Medicare Other

## 2016-08-19 ENCOUNTER — Inpatient Hospital Stay: Payer: Medicare Other | Admitting: Oncology

## 2016-08-19 NOTE — Progress Notes (Deleted)
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  INTERVAL HISTORY: Patient returns to clinic for further evaluation and discussion of her PET scan results. She has some mild erythema surrounding her PEG tube site, but otherwise feels well. She continues to have a poor appetite. She also has dysphagia, but states this is improving as well. She denies any pain. She has no neurologic complaints. She denies any fevers. She denies any weight loss. She has no chest pain or shortness of breath. She denies any nausea, vomiting, constipation, or diarrhea. She has no urinary complaints. Patient offers no further specific complaints.  REVIEW OF SYSTEMS:   Review of Systems  Constitutional: Negative.  Negative for fever, malaise/fatigue and weight loss.  HENT: Positive for sore throat.   Respiratory: Negative.  Negative for shortness of breath.   Cardiovascular: Negative.  Negative for chest pain.  Gastrointestinal: Negative.  Negative for abdominal pain.  Genitourinary: Negative.   Musculoskeletal: Negative.   Neurological: Negative.  Negative for weakness and headaches.  Psychiatric/Behavioral: Negative.  The patient is not nervous/anxious.     As per HPI. Otherwise, a complete review of systems is negatve.  PAST MEDICAL HISTORY: Past Medical History:  Diagnosis Date  . Arthritis    fingers  . Cancer ([place])    rectal  . Cancer ([place]) 2012   colon cancer  . Cancer of contiguous sites of hypopharynx ([place]) 12/23/2015  . Colon cancer ([place])   . Difficult  intubation   . Diverticulosis   . Hemorrhoids   . History of chicken pox   . Hypertension   . Osteopenia   . Status post chemotherapy    colon cancer  . Status post radiation therapy    colon cancer 2013    PAST SURGICAL HISTORY: Past Surgical History:  Procedure Laterality Date  . ABDOMINAL HYSTERECTOMY    . ABDOMINAL HYSTERECTOMY  1980   partial  . APPENDECTOMY    . APPENDECTOMY  [id number]   Dr. Pat Patrick  . BREAST CYST ASPIRATION Bilateral    negative  . BREAST SURGERY     cyst removal  . BREAST SURGERY  1960   Breast Biopsy  . CARDIAC CATHETERIZATION N/A 06/23/2016   Procedure: Left Heart Cath and Coronary Angiography;  Surgeon: Corey Skains, MD;  Location: [facility];  Service: Cardiovascular;  Laterality: N/A;  . COLON SURGERY  06/28/2013   resected tumor from colon; [facility]  . COLONOSCOPY    . COLONOSCOPY N/A 10/01/2015   Procedure: COLONOSCOPY;  Surgeon: [name], MD;  Location: [facility] ENDOSCOPY;  Service: Endoscopy;  Laterality: N/A;  . DILATION AND CURETTAGE OF UTERUS  1957  . DIRECT LARYNGOSCOPY N/A 12/11/2015   Procedure: DIRECT LARYNGOSCOPY;  Surgeon: Clyde Canterbury, MD;  Location: [facility] ORS;  Service: ENT;  Laterality: N/A;  . ESOPHAGOSCOPY N/A 12/11/2015   Procedure: ESOPHAGOSCOPY;  Surgeon: Clyde Canterbury, MD;  Location: [facility] ORS;  Service: ENT;  Laterality: N/A;  . EUS N/A 01/20/2013   Procedure: LOWER ENDOSCOPIC ULTRASOUND (EUS);  Surgeon: Milus Banister, MD;  Location: [facility];  Service: Endoscopy;  Laterality: N/A;  . PEG PLACEMENT N/A 01/01/2016   Procedure: PERCUTANEOUS ENDOSCOPIC GASTROSTOMY (PEG) PLACEMENT;  Surgeon: [name], MD;  Location: [facility] ORS;  Service: General;  Laterality: N/A;  . PORTACATH PLACEMENT Right 01/01/2016   Procedure: INSERTION PORT-A-CATH;  Surgeon: [name], MD;  Location: [facility] ORS;  Service: General;  Laterality: Right;  . TONSILLECTOMY    . TOTAL HIP ARTHROPLASTY Right  06/24/2016   Procedure: TOTAL HIP ARTHROPLASTY ANTERIOR APPROACH;  Surgeon: Hessie Knows, MD;  Location: [facility] ORS;  Service: Orthopedics;  Laterality: Right;    FAMILY HISTORY Family History  Problem Relation Age of Onset  . Kidney failure Father   . Breast cancer Maternal Grandmother        ADVANCED DIRECTIVES:    HEALTH MAINTENANCE: Social History  Substance Use Topics  . Smoking status: Current Every Day Smoker    Packs/day: 1.00    Years: 64.00    Types: Cigarettes  . Smokeless tobacco: Never Used  . Alcohol use 0.0 oz/week     Comment: rarely     Colonoscopy:  PAP:  Bone density:  Lipid panel:  Allergies  Allergen Reactions  . Amlodipine Besylate Other (See Comments)    Patient unaware of any allergy with this medicine  . Oxycodone     confusion    Current Outpatient Prescriptions  Medication Sig Dispense Refill  . Amino Acids-Protein Hydrolys (FEEDING SUPPLEMENT, PRO-STAT SUGAR FREE 64,) LIQD Place 30 mLs into feeding tube 2 (two) times daily. 900 mL 0  . Cholecalciferol (VITAMIN D-3) 1000 units CAPS Take 2,000 Units by mouth daily.    . cyanocobalamin 1000 MCG tablet Take 1,000 mcg by mouth daily.    Marland Kitchen docusate sodium (COLACE) 100 MG capsule Take 1 capsule (100 mg total) by mouth 2 (two) times daily. 60 capsule 0  . ferrous sulfate 325 (65 FE) MG tablet Take 325 mg by mouth 2 (two) times daily with a meal. Via PEG tube    . FLUoxetine (PROZAC) 10 MG tablet Take 1 tablet (10 mg total) by mouth daily. 30 tablet 3  . guaiFENesin (ROBITUSSIN) 100 MG/5ML SOLN Take 10 mLs by mouth every 4 (four) hours as needed for cough or to loosen phlegm.    Marland Kitchen HYDROcodone-acetaminophen (NORCO/VICODIN) 5-325 MG tablet Take 1-2 tablets by mouth every 4 (four) hours as needed for moderate pain. 30 tablet 0  . lactulose (CHRONULAC) 10 GM/15ML solution Take 30 mLs (20 g total) by mouth 2 (two) times daily as needed for moderate constipation or severe constipation. 240 mL 0  .  megestrol (MEGACE) 40 MG tablet Take 1 tablet (40 mg total) by mouth 2 (two) times daily. 60 tablet 2  . metoprolol succinate (TOPROL-XL) 25 MG 24 hr tablet Take 0.5 tablets (12.5 mg total) by mouth daily. 30 tablet 0  . Nutritional Supplements (FEEDING SUPPLEMENT, JEVITY 1.5 CAL/FIBER,) LIQD Place 1,000 mLs into feeding tube at bedtime. Over 10 hours at night 1500 mL 30  . nystatin cream (MYCOSTATIN) Apply topically 2 (two) times daily. 30 g 0  . pantoprazole (PROTONIX) 40 MG tablet Take 1 tablet (40 mg total) by mouth daily. (Patient taking differently: Take 40 mg by mouth 2 (two) times daily. ) 30 tablet 0  . ramipril (ALTACE) 2.5 MG capsule Take 1 capsule (2.5 mg total) by mouth at bedtime. 30 capsule 0   No current facility-administered medications for this visit.    Facility-Administered Medications Ordered in Other Visits  Medication Dose Route Frequency Provider  Last Rate Last Dose  . 0.9 %  sodium chloride infusion   Intravenous Once Mayra Reel, NP        OBJECTIVE: There were no vitals filed for this visit.   There is no height or weight on file to calculate BMI.    ECOG FS:1 - Symptomatic but completely ambulatory  General: Well-developed, well-nourished, no acute distress. Eyes: Pink conjunctiva, anicteric sclera. HEENT: Oropharynx clear without erythema or exudate. No palpable lymphadenopathy. Lungs: Clear to auscultation bilaterally. Heart: Regular rate and rhythm. No rubs, murmurs, or gallops. Abdomen: Soft, nontender, nondistended. PEG tube in place with mild erythema at insertion site. Musculoskeletal: No edema, cyanosis, or clubbing. Neuro: Alert, answering all questions appropriately. Cranial nerves grossly intact. Skin: No rashes or petechiae noted. Psych: Normal affect.   LAB RESULTS:  Lab Results  Component Value Date   NA 143 07/23/2016   K 4.0 07/23/2016   CL 115 (H) 07/19/2016   CO2 22 07/19/2016   GLUCOSE 96 07/19/2016   BUN 33 (A) 07/23/2016    CREATININE 1.0 07/23/2016   CALCIUM 9.6 07/19/2016   PROT 7.0 06/21/2016   ALBUMIN 2.6 (L) 07/20/2016   AST 10 (A) 07/23/2016   ALT 12 07/23/2016   ALKPHOS 81 07/23/2016   BILITOT 1.1 06/21/2016   GFRNONAA >60 07/19/2016   GFRAA >60 07/19/2016    Lab Results  Component Value Date   WBC 5.2 07/23/2016   NEUTROABS 5.0 07/18/2016   HGB 8.1 (A) 07/23/2016   HCT 25 (A) 07/23/2016   MCV 94.3 07/20/2016   PLT 309 07/23/2016   Lab Results  Component Value Date   CEA 4.9 (H) 05/20/2015     STUDIES: No results found.  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  PLAN:    1. Squamous cell carcinoma the hypopharynx: Patient completed concurrent chemotherapy and XRT approximately on February 19, 2016. By clinical exam, patient appears to have a complete response. PET scan results review independently and reported as above with a complete metabolic response. No further intervention is needed at this time.  Will do one final imaging in 6 months with CT scan.  If this is normal, no further imaging will be necessary unless there is suspicion of recurrence.  Return to clinic in 3 months for routine evaluation. She has also been instructed to keep her regular ENT follow-up. 2. Dysphasia: Improving. 3. PEG tube: Patient no longer is using her PEG tube.  Will send a referral Dr. Bary Castilla for removal.  Patient was also given a prescription for doxycycline for 7 days today for her mild erythema at the PEG site. 4. History of rectal cancer: Patient received neoadjuvant chemoradiation in approximately April 2014. Patient underwent surgery at [facility]. By report, patient could not tolerate postoperative chemotherapy with FOLFOX. Her most recent CEA in July 2016 was mildly elevated at 4.9.  Approximately 30 minutes was spent in discussion of which greater than 50% was consultation.  Patient expressed understanding and was in agreement with this plan. She also understands that  She can call clinic at any time with any questions, concerns, or complaints.   Cancer of contiguous sites of hypopharynx [facility])   Staging form: Pharynx - Hypopharynx, AJCC 7th Edition     Clinical: Stage III (T3, N1, M0) - Signed by [name], MD on 12/23/2015   Lloyd Huger, MD   08/19/2016 8:17 AM

## 2016-08-27 ENCOUNTER — Ambulatory Visit: Payer: Self-pay | Admitting: Radiation Oncology

## 2016-09-01 ENCOUNTER — Encounter: Payer: Self-pay | Admitting: *Deleted

## 2016-09-02 ENCOUNTER — Encounter: Payer: Self-pay | Admitting: General Surgery

## 2016-09-02 ENCOUNTER — Ambulatory Visit (INDEPENDENT_AMBULATORY_CARE_PROVIDER_SITE_OTHER): Payer: Medicare Other | Admitting: General Surgery

## 2016-09-02 VITALS — BP 138/78 | Resp 14 | Ht 65.0 in | Wt 119.0 lb

## 2016-09-02 DIAGNOSIS — C4442 Squamous cell carcinoma of skin of scalp and neck: Secondary | ICD-10-CM | POA: Diagnosis not present

## 2016-09-02 LAB — BASIC METABOLIC PANEL
BUN: 35 mg/dL — AB (ref 4–21)
CREATININE: 1.4 mg/dL — AB (ref 0.5–1.1)
GLUCOSE: 97 mg/dL
POTASSIUM: 4.6 mmol/L (ref 3.4–5.3)
Sodium: 143 mmol/L (ref 137–147)

## 2016-09-02 LAB — CBC AND DIFFERENTIAL
HCT: 33 % — AB (ref 36–46)
HEMOGLOBIN: 10.8 g/dL — AB (ref 12.0–16.0)
Platelets: 211 10*3/uL (ref 150–399)
WBC: 5 10*3/mL

## 2016-09-02 NOTE — Progress Notes (Signed)
Patient ID: Emily [name], female   DOB: 08/03/1933, 82 y.o.   MRN: [id number]  Chief Complaint  Patient presents with  . Follow-up    HPI Emily [name] is a 82 y.o. female.  Here today for follow up from throat cancer. She 76is no longer on the tube feedings. She is eating 75-100% of meals. She feels her feeding tube can be removed. She has seen Dr Richardson Landry and he feels it can be removed. Her weight in February was 124. Per her son her lowest weight while at the hospital was 96 lb. She is a resident of [facility] She is here today with her son, Emily [name].  HPI  Past Medical History:  Diagnosis Date  . Arthritis    fingers  . Cancer ([place])    rectal  . Cancer ([place]) 2012   colon cancer  . Cancer of contiguous sites of hypopharynx ([place]) 12/23/2015  . Colon cancer ([place])   . Difficult intubation   . Diverticulosis   . Hemorrhoids   . History of chicken pox   . Hypertension   . Osteopenia   . Status post chemotherapy    colon cancer  . Status post radiation therapy    colon cancer 2013    Past Surgical History:  Procedure Laterality Date  . ABDOMINAL HYSTERECTOMY    . ABDOMINAL HYSTERECTOMY  1980   partial  . APPENDECTOMY    . APPENDECTOMY  [id number]   Dr. Pat Patrick  . BREAST CYST ASPIRATION Bilateral    negative  . BREAST SURGERY     cyst removal  . BREAST SURGERY  1960   Breast Biopsy  . CARDIAC CATHETERIZATION N/A 06/23/2016   Procedure: Left Heart Cath and Coronary Angiography;  Surgeon: Corey Skains, MD;  Location: [place];  Service: Cardiovascular;  Laterality: N/A;  . COLON SURGERY  06/28/2013   resected tumor from colon; [facility]  . COLONOSCOPY    . COLONOSCOPY N/A 10/01/2015   Procedure: COLONOSCOPY;  Surgeon: [name], MD;  Location: [facility] ENDOSCOPY;  Service: Endoscopy;  Laterality: N/A;  . DILATION AND CURETTAGE OF UTERUS  1957  . DIRECT LARYNGOSCOPY N/A 12/11/2015   Procedure: DIRECT  LARYNGOSCOPY;  Surgeon: Clyde Canterbury, MD;  Location: [facility] ORS;  Service: ENT;  Laterality: N/A;  . ESOPHAGOSCOPY N/A 12/11/2015   Procedure: ESOPHAGOSCOPY;  Surgeon: Clyde Canterbury, MD;  Location: [facility] ORS;  Service: ENT;  Laterality: N/A;  . EUS N/A 01/20/2013   Procedure: LOWER ENDOSCOPIC ULTRASOUND (EUS);  Surgeon: Milus Banister, MD;  Location: [facility];  Service: Endoscopy;  Laterality: N/A;  . PEG PLACEMENT N/A 01/01/2016   Procedure: PERCUTANEOUS ENDOSCOPIC GASTROSTOMY (PEG) PLACEMENT;  Surgeon: [name], MD;  Location: [facility] ORS;  Service: General;  Laterality: N/A;  . PORTACATH PLACEMENT Right 01/01/2016   Procedure: INSERTION PORT-A-CATH;  Surgeon: [name], MD;  Location: [facility] ORS;  Service: General;  Laterality: Right;  . TONSILLECTOMY    . TOTAL HIP ARTHROPLASTY Right 06/24/2016   Procedure: TOTAL HIP ARTHROPLASTY ANTERIOR APPROACH;  Surgeon: Hessie Knows, MD;  Location: [facility] ORS;  Service: Orthopedics;  Laterality: Right;    Family History  Problem Relation Age of Onset  . Kidney failure Father   . Breast cancer Maternal Grandmother     Social History Social History  Substance Use Topics  . Smoking status: Former Smoker    Packs/day: 1.00    Years: 64.00    Types:  Cigarettes    Quit date: 06/21/2016  . Smokeless tobacco: Never Used  . Alcohol use 0.0 oz/week     Comment: rarely    Allergies  Allergen Reactions  . Amlodipine Besylate Other (See Comments)    Patient unaware of any allergy with this medicine  . Oxycodone     confusion    Current Outpatient Prescriptions  Medication Sig Dispense Refill  . acetaminophen (TYLENOL) 500 MG tablet Take 500 mg by mouth every 6 (six) hours as needed.    . Cholecalciferol (VITAMIN D-3) 1000 units CAPS Take 2,000 Units by mouth daily.    . cyanocobalamin 1000 MCG tablet Take 1,000 mcg by mouth daily.    Marland Kitchen docusate sodium (COLACE) 100 MG capsule Take 1 capsule (100 mg total) by mouth 2 (two) times daily. 60  capsule 0  . ferrous sulfate 325 (65 FE) MG tablet Take 325 mg by mouth 2 (two) times daily with a meal. Via PEG tube    . FLUoxetine (PROZAC) 10 MG tablet Take 1 tablet (10 mg total) by mouth daily. 30 tablet 3  . HYDROcodone-acetaminophen (NORCO/VICODIN) 5-325 MG tablet Take 1-2 tablets by mouth every 4 (four) hours as needed for moderate pain. 30 tablet 0  . lactulose (CHRONULAC) 10 GM/15ML solution Take 30 mLs (20 g total) by mouth 2 (two) times daily as needed for moderate constipation or severe constipation. 240 mL 0  . megestrol (MEGACE) 40 MG tablet Take 1 tablet (40 mg total) by mouth 2 (two) times daily. 60 tablet 2  . metoprolol succinate (TOPROL-XL) 25 MG 24 hr tablet Take 0.5 tablets (12.5 mg total) by mouth daily. 30 tablet 0  . nystatin cream (MYCOSTATIN) Apply topically 2 (two) times daily. 30 g 0  . pantoprazole (PROTONIX) 40 MG tablet Take 1 tablet (40 mg total) by mouth daily. (Patient taking differently: Take 40 mg by mouth 2 (two) times daily. ) 30 tablet 0  . ramipril (ALTACE) 2.5 MG capsule Take 1 capsule (2.5 mg total) by mouth at bedtime. 30 capsule 0  . guaiFENesin (ROBITUSSIN) 100 MG/5ML SOLN Take 10 mLs by mouth every 4 (four) hours as needed for cough or to loosen phlegm.     No current facility-administered medications for this visit.    Facility-Administered Medications Ordered in Other Visits  Medication Dose Route Frequency Provider Last Rate Last Dose  . 0.9 %  sodium chloride infusion   Intravenous Once Mayra Reel, NP        Review of Systems Review of Systems  Constitutional: Negative.   Respiratory: Negative.   Gastrointestinal: Negative for constipation, diarrhea, nausea and vomiting.    Blood pressure 138/78, resp. rate 14, height 5\' 5"  (1.651 m), weight 119 lb (54 kg).  Physical Exam Physical Exam  Abdominal:      Data Reviewed Nursing home records reviewed.  Assessment    Improved weight gain and swallowing function.    Plan     Tube removal was reviewed with the patient and her son. They were amenable to proceed. The area was cleansed with Betadine and 10 mL of 0.5% Xylocaine with 0.25% Marcaine with 1-200,000 of epinephrine was utilized well tolerated. The tube was removed without discomfort. The granulation tissue was treated with silver nitrate. A dry dressing was applied.  Instructions were provided to the skilled nursing facility to change the dressing twice a day and as needed as long as drainage is present. The patient's son is aware that drainage is anticipated usually for  the first 48 hours.     Follow up as needed. The patient is aware to call back for any questions or concerns.   [name] 09/02/2016, 8:31 PM

## 2016-09-02 NOTE — Patient Instructions (Signed)
The patient is aware to call back for any questions or concerns.  

## 2016-09-06 NOTE — Progress Notes (Deleted)
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  INTERVAL HISTORY: Patient returns to clinic for further evaluation and discussion of her PET scan results. She has some mild erythema surrounding her PEG tube site, but otherwise feels well. She continues to have a poor appetite. She also has dysphagia, but states this is improving as well. She denies any pain. She has no neurologic complaints. She denies any fevers. She denies any weight loss. She has no chest pain or shortness of breath. She denies any nausea, vomiting, constipation, or diarrhea. She has no urinary complaints. Patient offers no further specific complaints.  REVIEW OF SYSTEMS:   Review of Systems  Constitutional: Negative.  Negative for fever, malaise/fatigue and weight loss.  HENT: Positive for sore throat.   Respiratory: Negative.  Negative for shortness of breath.   Cardiovascular: Negative.  Negative for chest pain.  Gastrointestinal: Negative.  Negative for abdominal pain.  Genitourinary: Negative.   Musculoskeletal: Negative.   Neurological: Negative.  Negative for weakness and headaches.  Psychiatric/Behavioral: Negative.  The patient is not nervous/anxious.     As per HPI. Otherwise, a complete review of systems is negatve.  PAST MEDICAL HISTORY: Past Medical History:  Diagnosis Date  . Arthritis    fingers  . Cancer ([place])    rectal  . Cancer ([place]) 2012   colon cancer  . Cancer of contiguous sites of hypopharynx ([place]) 12/23/2015  . Colon cancer ([place])   . Difficult  intubation   . Diverticulosis   . Hemorrhoids   . History of chicken pox   . Hypertension   . Osteopenia   . Status post chemotherapy    colon cancer  . Status post radiation therapy    colon cancer 2013    PAST SURGICAL HISTORY: Past Surgical History:  Procedure Laterality Date  . ABDOMINAL HYSTERECTOMY    . ABDOMINAL HYSTERECTOMY  1980   partial  . APPENDECTOMY    . APPENDECTOMY  [id number]   Dr. Pat Patrick  . BREAST CYST ASPIRATION Bilateral    negative  . BREAST SURGERY     cyst removal  . BREAST SURGERY  1960   Breast Biopsy  . CARDIAC CATHETERIZATION N/A 06/23/2016   Procedure: Left Heart Cath and Coronary Angiography;  Surgeon: Corey Skains, MD;  Location: [place] [facility];  Service: Cardiovascular;  Laterality: N/A;  . COLON SURGERY  06/28/2013   resected tumor from colon; [facility]  . COLONOSCOPY    . COLONOSCOPY N/A 10/01/2015   Procedure: COLONOSCOPY;  Surgeon: [name], MD;  Location: [facility] ENDOSCOPY;  Service: Endoscopy;  Laterality: N/A;  . DILATION AND CURETTAGE OF UTERUS  1957  . DIRECT LARYNGOSCOPY N/A 12/11/2015   Procedure: DIRECT LARYNGOSCOPY;  Surgeon: Clyde Canterbury, MD;  Location: [facility] ORS;  Service: ENT;  Laterality: N/A;  . ESOPHAGOSCOPY N/A 12/11/2015   Procedure: ESOPHAGOSCOPY;  Surgeon: Clyde Canterbury, MD;  Location: [facility] ORS;  Service: ENT;  Laterality: N/A;  . EUS N/A 01/20/2013   Procedure: LOWER ENDOSCOPIC ULTRASOUND (EUS);  Surgeon: Milus Banister, MD;  Location: [facility];  Service: Endoscopy;  Laterality: N/A;  . PEG PLACEMENT N/A 01/01/2016   Procedure: PERCUTANEOUS ENDOSCOPIC GASTROSTOMY (PEG) PLACEMENT;  Surgeon: [name], MD;  Location: [facility] ORS;  Service: General;  Laterality: N/A;  . PORTACATH PLACEMENT Right 01/01/2016   Procedure: INSERTION PORT-A-CATH;  Surgeon: [name], MD;  Location: [facility] ORS;  Service: General;  Laterality: Right;  . TONSILLECTOMY    . TOTAL HIP ARTHROPLASTY Right  06/24/2016   Procedure: TOTAL HIP ARTHROPLASTY ANTERIOR APPROACH;  Surgeon: Hessie Knows, MD;  Location: [facility] ORS;  Service: Orthopedics;  Laterality: Right;    FAMILY HISTORY Family History  Problem Relation Age of Onset  . Kidney failure Father   . Breast cancer Maternal Grandmother        ADVANCED DIRECTIVES:    HEALTH MAINTENANCE: Social History  Substance Use Topics  . Smoking status: Former Smoker    Packs/day: 1.00    Years: 64.00    Types: Cigarettes    Quit date: 06/21/2016  . Smokeless tobacco: Never Used  . Alcohol use 0.0 oz/week     Comment: rarely     Colonoscopy:  PAP:  Bone density:  Lipid panel:  Allergies  Allergen Reactions  . Amlodipine Besylate Other (See Comments)    Patient unaware of any allergy with this medicine  . Oxycodone     confusion    Current Outpatient Prescriptions  Medication Sig Dispense Refill  . acetaminophen (TYLENOL) 500 MG tablet Take 500 mg by mouth every 6 (six) hours as needed.    . Cholecalciferol (VITAMIN D-3) 1000 units CAPS Take 2,000 Units by mouth daily.    . cyanocobalamin 1000 MCG tablet Take 1,000 mcg by mouth daily.    Marland Kitchen docusate sodium (COLACE) 100 MG capsule Take 1 capsule (100 mg total) by mouth 2 (two) times daily. 60 capsule 0  . ferrous sulfate 325 (65 FE) MG tablet Take 325 mg by mouth 2 (two) times daily with a meal. Via PEG tube    . FLUoxetine (PROZAC) 10 MG tablet Take 1 tablet (10 mg total) by mouth daily. 30 tablet 3  . guaiFENesin (ROBITUSSIN) 100 MG/5ML SOLN Take 10 mLs by mouth every 4 (four) hours as needed for cough or to loosen phlegm.    Marland Kitchen HYDROcodone-acetaminophen (NORCO/VICODIN) 5-325 MG tablet Take 1-2 tablets by mouth every 4 (four) hours as needed for moderate pain. 30 tablet 0  . lactulose (CHRONULAC) 10 GM/15ML solution Take 30 mLs (20 g total) by mouth 2 (two) times daily as needed for moderate constipation or severe constipation. 240 mL 0  . megestrol (MEGACE) 40 MG tablet Take 1  tablet (40 mg total) by mouth 2 (two) times daily. 60 tablet 2  . metoprolol succinate (TOPROL-XL) 25 MG 24 hr tablet Take 0.5 tablets (12.5 mg total) by mouth daily. 30 tablet 0  . nystatin cream (MYCOSTATIN) Apply topically 2 (two) times daily. 30 g 0  . pantoprazole (PROTONIX) 40 MG tablet Take 1 tablet (40 mg total) by mouth daily. (Patient taking differently: Take 40 mg by mouth 2 (two) times daily. ) 30 tablet 0  . ramipril (ALTACE) 2.5 MG capsule Take 1 capsule (2.5 mg total) by mouth at bedtime. 30 capsule 0   No current facility-administered medications for this visit.    Facility-Administered Medications Ordered in Other Visits  Medication Dose Route Frequency Provider Last Rate Last Dose  . 0.9 %  sodium chloride infusion   Intravenous Once Mayra Reel, NP        OBJECTIVE:  There were no vitals filed for this visit.   There is no height or weight on file to calculate BMI.    ECOG FS:1 - Symptomatic but completely ambulatory  General: Well-developed, well-nourished, no acute distress. Eyes: Pink conjunctiva, anicteric sclera. HEENT: Oropharynx clear without erythema or exudate. No palpable lymphadenopathy. Lungs: Clear to auscultation bilaterally. Heart: Regular rate and rhythm. No rubs, murmurs, or gallops. Abdomen: Soft, nontender, nondistended. PEG tube in place with mild erythema at insertion site. Musculoskeletal: No edema, cyanosis, or clubbing. Neuro: Alert, answering all questions appropriately. Cranial nerves grossly intact. Skin: No rashes or petechiae noted. Psych: Normal affect.   LAB RESULTS:  Lab Results  Component Value Date   NA 143 07/23/2016   K 4.0 07/23/2016   CL 115 (H) 07/19/2016   CO2 22 07/19/2016   GLUCOSE 96 07/19/2016   BUN 33 (A) 07/23/2016   CREATININE 1.0 07/23/2016   CALCIUM 9.6 07/19/2016   PROT 7.0 06/21/2016   ALBUMIN 2.6 (L) 07/20/2016   AST 10 (A) 07/23/2016   ALT 12 07/23/2016   ALKPHOS 81 07/23/2016   BILITOT 1.1  06/21/2016   GFRNONAA >60 07/19/2016   GFRAA >60 07/19/2016    Lab Results  Component Value Date   WBC 5.2 07/23/2016   NEUTROABS 5.0 07/18/2016   HGB 8.1 (A) 07/23/2016   HCT 25 (A) 07/23/2016   MCV 94.3 07/20/2016   PLT 309 07/23/2016   Lab Results  Component Value Date   CEA 4.9 (H) 05/20/2015     STUDIES: No results found.  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  PLAN:    1. Squamous cell carcinoma the hypopharynx: Patient completed concurrent chemotherapy and XRT approximately on February 19, 2016. By clinical exam, patient appears to have a complete response. PET scan results review independently and reported as above with a complete metabolic response. No further intervention is needed at this time.  Will do one final imaging in 6 months with CT scan.  If this is normal, no further imaging will be necessary unless there is suspicion of recurrence.  Return to clinic in 3 months for routine evaluation. She has also been instructed to keep her regular ENT follow-up. 2. Dysphasia: Improving. 3. PEG tube: Patient no longer is using her PEG tube.  Will send a referral Dr. Bary Castilla for removal.  Patient was also given a prescription for doxycycline for 7 days today for her mild erythema at the PEG site. 4. History of rectal cancer: Patient received neoadjuvant chemoradiation in approximately April 2014. Patient underwent surgery at [facility]. By report, patient could not tolerate postoperative chemotherapy with FOLFOX. Her most recent CEA in July 2016 was mildly elevated at 4.9.  Approximately 30 minutes was spent in discussion of which greater than 50% was consultation.  Patient expressed understanding and was in agreement with this plan. She also understands that She can call clinic at any time with any questions, concerns, or complaints.   Cancer of contiguous sites of hypopharynx [facility])   Staging form: Pharynx - Hypopharynx, AJCC 7th Edition      Clinical: Stage III (T3, N1, M0) - Signed by [name], MD on 12/23/2015   Lloyd Huger, MD   09/06/2016 9:55 PM

## 2016-09-08 ENCOUNTER — Inpatient Hospital Stay: Payer: Medicare Other | Admitting: Oncology

## 2016-09-08 ENCOUNTER — Inpatient Hospital Stay: Payer: Medicare Other

## 2016-09-16 ENCOUNTER — Encounter: Payer: Self-pay | Admitting: Internal Medicine

## 2016-09-16 ENCOUNTER — Non-Acute Institutional Stay (SKILLED_NURSING_FACILITY): Payer: Medicare Other | Admitting: Internal Medicine

## 2016-09-16 DIAGNOSIS — F329 Major depressive disorder, single episode, unspecified: Secondary | ICD-10-CM | POA: Diagnosis not present

## 2016-09-16 DIAGNOSIS — R638 Other symptoms and signs concerning food and fluid intake: Secondary | ICD-10-CM

## 2016-09-16 DIAGNOSIS — E440 Moderate protein-calorie malnutrition: Secondary | ICD-10-CM | POA: Diagnosis not present

## 2016-09-16 DIAGNOSIS — F32A Depression, unspecified: Secondary | ICD-10-CM

## 2016-09-16 NOTE — Progress Notes (Signed)
LOCATION:  [place]  PCP: Lelon Huh, MD   Code Status: DNR  Goals of care: Advanced Directive information Advanced Directives 07/22/2016  Does patient have an advance directive? Yes  Type of Advance Directive Out of facility DNR (pink MOST or yellow form)  Does patient want to make changes to advanced directive? No - Patient declined  Copy of advanced directive(s) in chart? Yes  Would patient like information on creating an advanced directive? -  Some encounter information is confidential and restricted. Go to Review Flowsheets activity to see all data.       Extended Emergency Contact Information Primary Emergency Contact: Osment,[name] Address: [place], [place] [address] Johnnette Litter of [place] Phone: [phone] Work Phone: [phone] Mobile Phone: [phone] Relation: Son Secondary Emergency Contact: Laurell Roof [place] of [place] Phone: [phone] Mobile Phone: [phone] Relation: Daughter   Allergies  Allergen Reactions  . Amlodipine Besylate Other (See Comments)    Patient unaware of any allergy with this medicine  . Oxycodone     confusion    Chief Complaint  Patient presents with  . Acute Visit    Medication management     HPI:  Patient is a 82 y.o. female seen today for acute visit. Her son would like to come off her Prozac. Patient is seen in her room today. She is currently on May case and has started feeding herself. Per nursing she overall has a flat affect. She participates some in conversation today. Staff has concerns about her losing weight.   Review of Systems:  Constitutional: Negative for fever Respiratory: Negative for shortness of breath  Cardiovascular: Negative for chest pain Gastrointestinal: Negative for heartburn, nausea, vomiting, abdominal pain.  Genitourinary: Negative for dysuria  Musculoskeletal: Negative for fall.    Past Medical History:  Diagnosis Date  . Arthritis     fingers  . Cancer ([place])    rectal  . Cancer ([place]) 2012   colon cancer  . Cancer of contiguous sites of hypopharynx ([place]) 12/23/2015  . Colon cancer ([place])   . Difficult intubation   . Diverticulosis   . Hemorrhoids   . History of chicken pox   . Hypertension   . Osteopenia   . Status post chemotherapy    colon cancer  . Status post radiation therapy    colon cancer 2013   Past Surgical History:  Procedure Laterality Date  . ABDOMINAL HYSTERECTOMY    . ABDOMINAL HYSTERECTOMY  1980   partial  . APPENDECTOMY    . APPENDECTOMY  [id number]   Dr. Pat Patrick  . BREAST CYST ASPIRATION Bilateral    negative  . BREAST SURGERY     cyst removal  . BREAST SURGERY  1960   Breast Biopsy  . CARDIAC CATHETERIZATION N/A 06/23/2016   Procedure: Left Heart Cath and Coronary Angiography;  Surgeon: Corey Skains, MD;  Location: [place] CV LAB;  Service: Cardiovascular;  Laterality: N/A;  . COLON SURGERY  06/28/2013   resected tumor from colon; [facility]  . COLONOSCOPY    . COLONOSCOPY N/A 10/01/2015   Procedure: COLONOSCOPY;  Surgeon: [name], MD;  Location: [facility] ENDOSCOPY;  Service: Endoscopy;  Laterality: N/A;  . DILATION AND CURETTAGE OF UTERUS  1957  . DIRECT LARYNGOSCOPY N/A 12/11/2015   Procedure: DIRECT LARYNGOSCOPY;  Surgeon: Clyde Canterbury, MD;  Location: [facility] ORS;  Service: ENT;  Laterality: N/A;  . ESOPHAGOSCOPY  N/A 12/11/2015   Procedure: ESOPHAGOSCOPY;  Surgeon: Clyde Canterbury, MD;  Location: [facility] ORS;  Service: ENT;  Laterality: N/A;  . EUS N/A 01/20/2013   Procedure: LOWER ENDOSCOPIC ULTRASOUND (EUS);  Surgeon: Milus Banister, MD;  Location: [facility]OS[facility];  Service: Endoscopy;  Laterality: N/A;  . PEG PLACEMENT N/A 01/01/2016   Procedure: PERCUTANEOUS ENDOSCOPIC GASTROSTOMY (PEG) PLACEMENT;  Surgeon: [name], MD;  Location: [facility] ORS;  Service: General;  Laterality: N/A;  . PORTACATH PLACEMENT Right 01/01/2016   Procedure: INSERTION PORT-A-CATH;   Surgeon: [name], MD;  Location: [facility] ORS;  Service: General;  Laterality: Right;  . TONSILLECTOMY    . TOTAL HIP ARTHROPLASTY Right 06/24/2016   Procedure: TOTAL HIP ARTHROPLASTY ANTERIOR APPROACH;  Surgeon: Hessie Knows, MD;  Location: [facility] ORS;  Service: Orthopedics;  Laterality: Right;    Medications:   Medication List       Accurate as of 09/16/16 12:43 PM. Always use your most recent med list.          acetaminophen 325 MG tablet Commonly known as:  TYLENOL Take 650 mg by mouth every 8 (eight) hours.   cyanocobalamin 1000 MCG tablet Take 1,000 mcg by mouth daily.   docusate sodium 100 MG capsule Commonly known as:  COLACE Take 1 capsule (100 mg total) by mouth 2 (two) times daily.   ferrous sulfate 325 (65 FE) MG tablet Take 325 mg by mouth 2 (two) times daily with a meal. Via PEG tube   FLUoxetine 10 MG tablet Commonly known as:  PROZAC Take 1 tablet (10 mg total) by mouth daily.   guaiFENesin 100 MG/5ML Soln Commonly known as:  ROBITUSSIN Take 10 mLs by mouth every 4 (four) hours as needed for cough or to loosen phlegm.   HYDROcodone-acetaminophen 5-325 MG tablet Commonly known as:  NORCO/VICODIN Take 1-2 tablets by mouth every 4 (four) hours as needed for moderate pain.   lactulose 10 GM/15ML solution Commonly known as:  CHRONULAC Take 30 mLs (20 g total) by mouth 2 (two) times daily as needed for moderate constipation or severe constipation.   megestrol 40 MG tablet Commonly known as:  MEGACE Take 1 tablet (40 mg total) by mouth 2 (two) times daily.   metoprolol succinate 25 MG 24 hr tablet Commonly known as:  TOPROL-XL Take 12.5 mg by mouth 2 (two) times daily.   nystatin cream Commonly known as:  MYCOSTATIN Apply topically 2 (two) times daily.   pantoprazole 40 MG tablet Commonly known as:  PROTONIX Take 40 mg by mouth 2 (two) times daily.   ramipril 2.5 MG capsule Commonly known as:  ALTACE Take 1 capsule (2.5 mg total) by mouth at  bedtime.   UNABLE TO FIND Med Name: Med pass 120 mL by mouth 3 times daily   Vitamin D-3 1000 units Caps Take 2,000 Units by mouth daily.       Immunizations: Immunization History  Administered Date(s) Administered  . Influenza,inj,Quad PF,36+ Mos 08/15/2015, 07/20/2016  . Pneumococcal Polysaccharide-23 11/03/1999, 11/02/2005  . Zoster 04/10/2014     Physical Exam:  Vitals:   09/16/16 1235  BP: (!) 138/53  Pulse: 64  Resp: 17  Temp: 97.8 F (36.6 C)  TempSrc: Oral  SpO2: 98%  Weight: 107 lb 11.2 oz (48.9 kg)  Height: 5\' 4"  (1.626 m)    General- elderly female, frail and thin built, in no acute distress Head- normocephalic, atraumatic, temporal muscle wasting Throat- moist mucus membrane Eyes- no pallor, no icterus, poor  eye contact Neck- no cervical lymphadenopathy Cardiovascular- normal s1,s2, no murmur Respiratory- bilateral clear to auscultation Abdomen- bowel sounds present, soft, non tender Musculoskeletal- able to move all 4 extremities, generalized weakness Neurological- alert and oriented to self Skin- warm and dry Psychiatry- flat affect    Labs reviewed: Basic Metabolic Panel:  Recent Labs  06/26/16 0419 06/27/16 0515 07/18/16 2211 07/19/16 0514 07/23/16 09/02/16  NA 138 138 138 141 143 143  K 2.9* 4.0 4.3 3.8 4.0 4.6  CL 114* 110 109 115*  --   --   CO2 19* 24 25 22   --   --   GLUCOSE 108* 99 96 96  --   --   BUN 31* 23* 37* 30* 33* 35*  CREATININE 0.99 1.08* 0.90 0.84 1.0 1.4*  CALCIUM 7.6* 8.1* 10.2 9.6  --   --   MG 1.6* 1.9  --  1.8  --   --   PHOS  --   --   --  2.9  --   --    Liver Function Tests:  Recent Labs  04/15/16 1336 05/19/16 1115 06/21/16 1607 07/20/16 0813 07/23/16  AST 23 22 76*  --  10*  ALT 12* 11* 38  --  12  ALKPHOS 66 70 61  --  81  BILITOT 0.6 0.6 1.1  --   --   PROT 7.2 6.7 7.0  --   --   ALBUMIN 4.1 3.8 3.8 2.6*  --    No results for input(s): LIPASE, AMYLASE in the last 8760 hours. No results  for input(s): AMMONIA in the last 8760 hours. CBC:  Recent Labs  04/15/16 1336 05/19/16 1115  07/18/16 2211 07/19/16 0514 07/20/16 0813  07/23/16 07/30/16 09/02/16  WBC 7.2 5.3  < > 6.6 5.7 6.1  --  5.2 7.5 5.0  NEUTROABS 5.5 3.6  --  5.0  --   --   --   --   --   --   HGB 13.1 13.1  < > 8.8* 8.6* 8.7*  < > 8.1* 9.1* 10.8*  HCT 38.2 38.5  < > 25.4* 25.0* 25.5*  --  25* 28* 33*  MCV 93.6 93.9  < > 92.8 94.2 94.3  --   --   --   --   PLT 245 190  < > 344 312 289  --  309 296 211  < > = values in this interval not displayed. Cardiac Enzymes:  Recent Labs  06/21/16 1607  06/21/16 2254 06/22/16 0238 07/18/16 2211  CKTOTAL 767*  --   --   --   --   TROPONINI 2.88*  < > 2.83* 2.63* <0.03  < > = values in this interval not displayed. BNP: Invalid input(s): POCBNP CBG:  Recent Labs  12/19/15 1125 05/14/16 1136  GLUCAP 105* 104*    Radiological Exams: No results found.  Assessment/Plan  Acute depression Currently on Prozac 10 mg daily. Given her flat affect on today's visit, we will not discontinue Prozac at present. Will have psychiatric service to evaluate her further.  Poor oral intake With ongoing weight loss. Currently on megace 40 mg twice a day for now to help stimulate appetite. Encourage oral intake. Registered dietitian on board. Monitor weight on a weekly basis. Continue Megace for now   Malnutrition of moderate degree With ongoing weight loss. Has poor oral intake. Will have registered dietitian to follow. Continue her protein supplements.    Blanchie Serve, MD Internal Medicine [facility]  [facility] [address] [place], [place] [address] Cell Phone (Monday-Friday 8 am - 5 pm): [phone] On Call: [phone] and follow prompts after 5 pm and on weekends Office Phone: [phone] Office Fax: [phone]

## 2016-09-18 ENCOUNTER — Telehealth: Payer: Self-pay | Admitting: Family Medicine

## 2016-09-18 ENCOUNTER — Non-Acute Institutional Stay (SKILLED_NURSING_FACILITY): Payer: Medicare Other | Admitting: Family

## 2016-09-18 DIAGNOSIS — M25551 Pain in right hip: Secondary | ICD-10-CM

## 2016-09-18 DIAGNOSIS — I10 Essential (primary) hypertension: Secondary | ICD-10-CM

## 2016-09-18 DIAGNOSIS — F329 Major depressive disorder, single episode, unspecified: Secondary | ICD-10-CM | POA: Diagnosis not present

## 2016-09-18 DIAGNOSIS — F32A Depression, unspecified: Secondary | ICD-10-CM

## 2016-09-18 DIAGNOSIS — K5901 Slow transit constipation: Secondary | ICD-10-CM

## 2016-09-18 DIAGNOSIS — D509 Iron deficiency anemia, unspecified: Secondary | ICD-10-CM

## 2016-09-18 DIAGNOSIS — R2681 Unsteadiness on feet: Secondary | ICD-10-CM

## 2016-09-18 DIAGNOSIS — E440 Moderate protein-calorie malnutrition: Secondary | ICD-10-CM | POA: Diagnosis not present

## 2016-09-18 MED ORDER — HYDROCODONE-ACETAMINOPHEN 5-325 MG PO TABS: 1.0000 | ORAL_TABLET | Freq: Four times a day (QID) | ORAL | 0 refills | Status: DC | PRN

## 2016-09-18 NOTE — Telephone Encounter (Signed)
Pt is being discharged from [facility] 09/19/2016 for a right hip replacement.  I have scheduled a hospital follow appointment/MW

## 2016-09-18 NOTE — Progress Notes (Signed)
Location:  [place] Room Number: 1203 P  Place of Service:  SNF [phone])  Provider:Beverlie Kurihara FNP-C   PCP: Lelon Huh, MD Patient Care Team: [name], MD as PCP - General (Family Medicine) [name], MD (General Surgery) [name], MD (Unknown Physician Specialty) [name], MD (Family Medicine) [name], MD (General Surgery)  Extended Emergency Contact Information Primary Emergency Contact: Digilio,[name] Address: [address]          [place], [place] [address] Johnnette Litter of [place] Phone: [phone] Work Phone: [phone] Mobile Phone: [phone] Relation: Son Secondary Emergency Contact: Laurell Roof [place] of [place] Phone: [phone] Mobile Phone: [phone] Relation: Daughter  Code Status: DNR Goals of care:  Advanced Directive information Advanced Directives 07/22/2016  Does patient have an advance directive? Yes  Type of Advance Directive Out of facility DNR (pink MOST or yellow form)  Does patient want to make changes to advanced directive? No - Patient declined  Copy of advanced directive(s) in chart? Yes  Would patient like information on creating an advanced directive? -  Some encounter information is confidential and restricted. Go to Review Flowsheets activity to see all data.     Allergies  Allergen Reactions  . Amlodipine Besylate Other (See Comments)    Patient unaware of any allergy with this medicine  . Oxycodone     confusion    Chief Complaint  Patient presents with  . Discharge Note    Discharge Home     HPI:  82 y.o. female seen today at [facility] for discharge home.She was here for short term Rehabilitation post hospital admission from 07/18/16-07/21/16 with anemia and possible GI bleed.She was placed on I.V. Protonix. She received PRBC transfusion in hospital. EGD was deferred in the hospital as it was thought to be unrevealing.Of  note, she had hip fracture surgical repair on 06/24/16. During that admission, she also received PRBC transfusion. She has a medical History of HTN,depression, OA, Osteopenia, Colon and Throat cancer post radiation/chemotherapy. She is seen in her room today. She denies any acute issues this visit. During her stay here in Rehab, she followed up with GI and her PEG tube was removed.She was on Jevity 1.5 @ 70 mls/HR from 7Pm- 7AM via PEG tube with water flushes.Currently on mechanical soft diet and follows up with facility RD. She is on Med pass 2.0 120 cc three times daily.She eats 75-100 % of all meals. She has had progressive weight gain 12.2 lbs over two months. Her right hip pain under control with current regimen. She has worked well with PT/OT now stable for discharge home.She will be discharged home with Home health PT/OT to continue with ROM, Exercise, Gait stability and muscle strengthening. She does not require any DME has own walker.Home health services will be arranged by facility social worker prior to discharge. Prescription medication will be written x 1 month then patient to follow up with PCP in 1-2 weeks. Facility staff report no new concerns.      Past Medical History:  Diagnosis Date  . Arthritis    fingers  . Cancer ([place])    rectal  . Cancer ([place]) 2012   colon cancer  . 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 Colon cancer ([place])   . Difficult intubation   . Diverticulosis   . Hemorrhoids   . History of chicken pox   . Hypertension   .  Osteopenia   . Status post chemotherapy    colon cancer  . Status post radiation therapy    colon cancer 2013    Past Surgical History:  Procedure Laterality Date  . ABDOMINAL HYSTERECTOMY    . ABDOMINAL HYSTERECTOMY  1980   partial  . APPENDECTOMY    . APPENDECTOMY  [id number]   Dr. Pat Patrick  . BREAST CYST ASPIRATION Bilateral    negative  . BREAST SURGERY     cyst removal  . BREAST SURGERY  1960   Breast Biopsy  .  CARDIAC CATHETERIZATION N/A 06/23/2016   Procedure: Left Heart Cath and Coronary Angiography;  Surgeon: Corey Skains, MD;  Location: [place];  Service: Cardiovascular;  Laterality: N/A;  . COLON SURGERY  06/28/2013   resected tumor from colon; [facility]  . COLONOSCOPY    . COLONOSCOPY N/A 10/01/2015   Procedure: COLONOSCOPY;  Surgeon: [name], MD;  Location: [facility] ENDOSCOPY;  Service: Endoscopy;  Laterality: N/A;  . DILATION AND CURETTAGE OF UTERUS  1957  . DIRECT LARYNGOSCOPY N/A 12/11/2015   Procedure: DIRECT LARYNGOSCOPY;  Surgeon: Clyde Canterbury, MD;  Location: [facility] ORS;  Service: ENT;  Laterality: N/A;  . ESOPHAGOSCOPY N/A 12/11/2015   Procedure: ESOPHAGOSCOPY;  Surgeon: Clyde Canterbury, MD;  Location: [facility] ORS;  Service: ENT;  Laterality: N/A;  . EUS N/A 01/20/2013   Procedure: LOWER ENDOSCOPIC ULTRASOUND (EUS);  Surgeon: Milus Banister, MD;  Location: [facility];  Service: Endoscopy;  Laterality: N/A;  . PEG PLACEMENT N/A 01/01/2016   Procedure: PERCUTANEOUS ENDOSCOPIC GASTROSTOMY (PEG) PLACEMENT;  Surgeon: [name], MD;  Location: [facility] ORS;  Service: General;  Laterality: N/A;  . PORTACATH PLACEMENT Right 01/01/2016   Procedure: INSERTION PORT-A-CATH;  Surgeon: [name], MD;  Location: [facility] ORS;  Service: General;  Laterality: Right;  . TONSILLECTOMY    . TOTAL HIP ARTHROPLASTY Right 06/24/2016   Procedure: TOTAL HIP ARTHROPLASTY ANTERIOR APPROACH;  Surgeon: Hessie Knows, MD;  Location: [facility] ORS;  Service: Orthopedics;  Laterality: Right;      reports that she quit smoking about 2 months ago. Her smoking use included Cigarettes. She has a 64.00 pack-year smoking history. She has never used smokeless tobacco. She reports that she drinks alcohol. She reports that she does not use drugs. Social History   Social History  . Marital status: Widowed    Spouse name: N/A  . Number of children: 3  . Years of education: N/A    Occupational History  . Unemployed     Homemaker   Social History Main Topics  . Smoking status: Former Smoker    Packs/day: 1.00    Years: 64.00    Types: Cigarettes    Quit date: 06/21/2016  . Smokeless tobacco: Never Used  . Alcohol use 0.0 oz/week     Comment: rarely  . Drug use: No  . Sexual activity: Not on file   Other Topics Concern  . Not on file   Social History Narrative   ** Merged History Encounter **          Allergies  Allergen Reactions  . Amlodipine Besylate Other (See Comments)    Patient unaware of any allergy with this medicine  . Oxycodone     confusion    Pertinent  Health Maintenance Due  Topic Date Due  . PNA vac Low Risk Adult (2 of 2 - PCV13) 11/02/2006  . COLONOSCOPY  09/30/2018  . INFLUENZA VACCINE  Completed  .  DEXA SCAN  Completed    Medications:   Medication List       Accurate as of 09/18/16  3:31 PM. Always use your most recent med list.          acetaminophen 325 MG tablet Commonly known as:  TYLENOL Take 650 mg by mouth every 8 (eight) hours.   cyanocobalamin 1000 MCG tablet Take 1,000 mcg by mouth daily.   docusate sodium 100 MG capsule Commonly known as:  COLACE Take 1 capsule (100 mg total) by mouth 2 (two) times daily.   ferrous sulfate 325 (65 FE) MG tablet Take 325 mg by mouth 2 (two) times daily with a meal. Via PEG tube   FLUoxetine 10 MG tablet Commonly known as:  PROZAC Take 1 tablet (10 mg total) by mouth daily.   guaiFENesin 100 MG/5ML Soln Commonly known as:  ROBITUSSIN Take 10 mLs by mouth every 4 (four) hours as needed for cough or to loosen phlegm.   HYDROcodone-acetaminophen 5-325 MG tablet Commonly known as:  NORCO/VICODIN Take 1 tablet by mouth every 6 (six) hours as needed for moderate pain.   lactulose 10 GM/15ML solution Commonly known as:  CHRONULAC Take 30 mLs (20 g total) by mouth 2 (two) times daily as needed for moderate constipation or severe constipation.   megestrol 40  MG tablet Commonly known as:  MEGACE Take 1 tablet (40 mg total) by mouth 2 (two) times daily.   metoprolol succinate 25 MG 24 hr tablet Commonly known as:  TOPROL-XL Take 12.5 mg by mouth 2 (two) times daily.   nystatin cream Commonly known as:  MYCOSTATIN Apply topically 2 (two) times daily.   pantoprazole 40 MG tablet Commonly known as:  PROTONIX Take 40 mg by mouth 2 (two) times daily.   ramipril 2.5 MG capsule Commonly known as:  ALTACE Take 1 capsule (2.5 mg total) by mouth at bedtime.   UNABLE TO FIND Med Name: Med pass 120 mL by mouth 3 times daily   Vitamin D-3 1000 units Caps Take 2,000 Units by mouth daily.       Review of Systems  Constitutional: Negative for activity change, appetite change, chills, fatigue and fever.  HENT: Negative for congestion, rhinorrhea, sneezing and sore throat.   Eyes: Negative.   Respiratory: Negative for cough, chest tightness, shortness of breath and wheezing.   Cardiovascular: Negative for chest pain, palpitations and leg swelling.  Gastrointestinal: Negative for abdominal distention, abdominal pain, constipation, diarrhea, nausea and vomiting.  Endocrine: Negative for cold intolerance, heat intolerance, polydipsia, polyphagia and polyuria.  Genitourinary: Negative for dysuria, flank pain, frequency and urgency.  Musculoskeletal: Positive for gait problem.       Right thigh pain under control with current regimen.   Skin: Negative for color change, pallor, rash and wound.  Neurological: Negative for dizziness, seizures, syncope, light-headedness and headaches.  Hematological: Does not bruise/bleed easily.  Psychiatric/Behavioral: Negative for agitation, confusion, hallucinations and sleep disturbance.    Vitals:   09/18/16 1400  BP: 136/76  Pulse: 76  Resp: 18  Temp: 98.6 F (37 C)  SpO2: 96%  Weight: 117 lb 3.2 oz (53.2 kg)  Height: 5\' 4"  (1.626 m)   Body mass index is 20.12 kg/m. Physical Exam  Constitutional:   Thin Frail elderly in no acute distress  HENT:  Head: Normocephalic.  Mouth/Throat: Oropharynx is clear and moist. No oropharyngeal exudate.  Eyes: Conjunctivae and EOM are normal. Pupils are equal, round, and reactive to light. Right eye exhibits  no discharge. Left eye exhibits no discharge. No scleral icterus.  Neck: Normal range of motion. No JVD present. No thyromegaly present.  Cardiovascular: Normal rate, regular rhythm, normal heart sounds and intact distal pulses.  Exam reveals no gallop and no friction rub.   No murmur heard. Pulmonary/Chest: Effort normal and breath sounds normal. No respiratory distress. She has no wheezes. She has no rales.  Abdominal: Soft. Bowel sounds are normal. She exhibits no distension. There is no tenderness. There is no rebound and no guarding.  Musculoskeletal: She exhibits no edema, tenderness or deformity.  Moves x 4 extremities.Unsteady gait.  Lymphadenopathy:    She has no cervical adenopathy.  Neurological: She is alert.  Skin: Skin is warm and dry. No rash noted. No erythema. No pallor.  Right Thigh old incision healed surrounding skin without any signs of infections.   Psychiatric: She has a normal mood and affect.    Labs reviewed: Basic Metabolic Panel:  Recent Labs  06/26/16 0419 06/27/16 0515 07/18/16 2211 07/19/16 0514 07/23/16 09/02/16  NA 138 138 138 141 143 143  K 2.9* 4.0 4.3 3.8 4.0 4.6  CL 114* 110 109 115*  --   --   CO2 19* 24 25 22   --   --   GLUCOSE 108* 99 96 96  --   --   BUN 31* 23* 37* 30* 33* 35*  CREATININE 0.99 1.08* 0.90 0.84 1.0 1.4*  CALCIUM 7.6* 8.1* 10.2 9.6  --   --   MG 1.6* 1.9  --  1.8  --   --   PHOS  --   --   --  2.9  --   --    Liver Function Tests:  Recent Labs  04/15/16 1336 05/19/16 1115 06/21/16 1607 07/20/16 0813 07/23/16  AST 23 22 76*  --  10*  ALT 12* 11* 38  --  12  ALKPHOS 66 70 61  --  81  BILITOT 0.6 0.6 1.1  --   --   PROT 7.2 6.7 7.0  --   --   ALBUMIN 4.1 3.8 3.8 2.6*   --    CBC:  Recent Labs  04/15/16 1336 05/19/16 1115  07/18/16 2211 07/19/16 0514 07/20/16 0813  07/23/16 07/30/16 09/02/16  WBC 7.2 5.3  < > 6.6 5.7 6.1  --  5.2 7.5 5.0  NEUTROABS 5.5 3.6  --  5.0  --   --   --   --   --   --   HGB 13.1 13.1  < > 8.8* 8.6* 8.7*  < > 8.1* 9.1* 10.8*  HCT 38.2 38.5  < > 25.4* 25.0* 25.5*  --  25* 28* 33*  MCV 93.6 93.9  < > 92.8 94.2 94.3  --   --   --   --   PLT 245 190  < > 344 312 289  --  309 296 211  < > = values in this interval not displayed. Cardiac Enzymes:  Recent Labs  06/21/16 1607  06/21/16 2254 06/22/16 0238 07/18/16 2211  CKTOTAL 767*  --   --   --   --   TROPONINI 2.88*  < > 2.83* 2.63* <0.03  < > = values in this interval not displayed.  Recent Labs  12/19/15 1125 05/14/16 1136  GLUCAP 105* 104*   Assessment/Plan:   1. Essential hypertension B/p stable. Continue on Ramipril and metoprolol. BMP in 1-2 weeks with PCP  2. Unsteady gait Has worked well  with PT/ OT. Will discharge home PT/OT to continue with ROM, Exercise, Gait stability and muscle strengthening. She does not require any DME has own walker at home.Fall and safety precautions.   3. Malnutrition of moderate degree Continue Med pass 2.0 supplement 120 cc three times daily.BMP in 1-2 weeks with PCP then adjust supplements if needed.   4. Right hip pain Status post surgical repair on 06/24/16.Current pain regimen effective. Wean off Hydrocodone/APAP as tolerated.   5. Iron deficiency anemia, unspecified iron deficiency anemia type  status post hospital admission from 07/18/16-07/21/16 with anemia and possible GI bleed.She was placed on I.V. Protonix. She received PRBC transfusion in hospital. EGD was deferred in the hospital as it was thought to be unrevealing.No signs of GI bleed during rehab stay noted. Continue on Protonix 40 mg Tablet twice daily.   6. Depression, unspecified depression type Stable. Continue on Prozac. Monitor for mood changes.   7.  Slow transit constipation Current regimen effective. Continue to encourage oral intake and fluid intake.   Patient is being discharged with the following home health services:    -PT/OT for ROM, exercise, gait stability and muscle strengthening   Patient is being discharged with the following durable medical equipment:    -None required has own walker at home.   Patient has been advised to f/u with their PCP in 1-2 weeks to for a transitions of care visit. Social services at their facility was responsible for arranging this appointment. Pt was provided with adequate prescriptions of noncontrolled medications to reach the scheduled appointment .For controlled substances, a limited supply was provided as appropriate for the individual patient.If the pt normally receives these medications from a pain clinic or has a contract with another physician, these medications should be received from that clinic or physician only).    Future labs/tests needed:  CBC, BMP in 1-2 weeks with PCP

## 2016-09-18 NOTE — Telephone Encounter (Signed)
FYI. KW 

## 2016-09-21 ENCOUNTER — Other Ambulatory Visit: Payer: Self-pay

## 2016-09-21 MED ORDER — HYDROCODONE-ACETAMINOPHEN 5-325 MG PO TABS: 1.0000 | ORAL_TABLET | Freq: Four times a day (QID) | ORAL | 0 refills | Status: DC | PRN

## 2016-09-21 NOTE — Telephone Encounter (Signed)
Prescription request was received from:  [facility] [address] [place] [address]  Phone: [phone]  Fax: [phone]  

## 2016-09-22 ENCOUNTER — Inpatient Hospital Stay: Payer: Medicare Other

## 2016-09-22 ENCOUNTER — Ambulatory Visit: Payer: Medicare Other | Admitting: Oncology

## 2016-09-22 ENCOUNTER — Other Ambulatory Visit: Payer: Medicare Other

## 2016-10-06 ENCOUNTER — Encounter: Payer: Self-pay | Admitting: Family Medicine

## 2016-10-06 ENCOUNTER — Ambulatory Visit (INDEPENDENT_AMBULATORY_CARE_PROVIDER_SITE_OTHER): Payer: Medicare Other | Admitting: Family Medicine

## 2016-10-06 VITALS — BP 130/58 | HR 70 | Temp 98.4°F | Resp 16 | Wt 123.0 lb

## 2016-10-06 DIAGNOSIS — I519 Heart disease, unspecified: Secondary | ICD-10-CM | POA: Diagnosis not present

## 2016-10-06 DIAGNOSIS — I252 Old myocardial infarction: Secondary | ICD-10-CM

## 2016-10-06 DIAGNOSIS — I5181 Takotsubo syndrome: Secondary | ICD-10-CM | POA: Insufficient documentation

## 2016-10-06 DIAGNOSIS — I10 Essential (primary) hypertension: Secondary | ICD-10-CM

## 2016-10-06 DIAGNOSIS — Z23 Encounter for immunization: Secondary | ICD-10-CM | POA: Diagnosis not present

## 2016-10-06 DIAGNOSIS — Z720 Tobacco use: Secondary | ICD-10-CM

## 2016-10-06 DIAGNOSIS — E2839 Other primary ovarian failure: Secondary | ICD-10-CM | POA: Diagnosis not present

## 2016-10-06 DIAGNOSIS — E559 Vitamin D deficiency, unspecified: Secondary | ICD-10-CM | POA: Diagnosis not present

## 2016-10-06 MED ORDER — LISINOPRIL 10 MG PO TABS: 10.0000 mg | ORAL_TABLET | Freq: Every day | ORAL | 5 refills | Status: DC

## 2016-10-06 NOTE — Progress Notes (Signed)
Patient: Emily [name] Female    DOB: 08/26/1933   82 y.o.   MRN: [id number] Visit Date: 10/06/2016  Today's Provider: Lelon Huh, MD   No chief complaint on file.  Subjective:    HPI Hip fracture, follow up: Patient presents today for a follow up. She was discharged from [facility] center on 09/19/2016 for rehab of her right hip fracture done in August by Dr. Rudene Christians. Fracture occurred in setting of fall at home and non-STEMI. She had cardiac cath findings of atrial ballooning and systolic dysfunction, but normal coronaries. She was discharged on metoprolol and ramipril, which has since been changed to lisinopril, which she is tolerating well. She is now living at [facility] and doing well. Ambulates with a walker. Is scheduled to start PT next week.   She has hypopharynx cancer followed by Dr. Grayland Ormond. Last seen in July with negative PET scan. Anticipated repeat PET scan in January.  She had feeding tube removed by Dr. Bary Castilla in November.   She had been on fluoxetine for depression related to prolonged cancer treatments. Her son took her off since hospitalization and she feels that her mood is good. Her last PET scan was clear and is no longer on chemo. Has been eating and drinking well and feeding tube was removed about a month ago. She is due to follow up with oncologist next month.   Wt Readings from Last 3 Encounters:  10/06/16 123 lb (55.8 kg)  09/18/16 117 lb 3.2 oz (53.2 kg)  09/16/16 107 lb 11.2 oz (48.9 kg)     Hypertension, follow-up:  BP Readings from Last 3 Encounters:  09/18/16 136/76  09/16/16 (!) 138/53  09/02/16 138/78    She was last seen for hypertension 4 months ago.  BP at that visit was 130/64. Management since that visit includes no changes. She reports good compliance with treatment. She is not having side effects. none She is not exercising. She is adherent to low salt diet.   Outside blood pressures are n/a. She is experiencing  none.  Patient denies none.   Cardiovascular risk factors include none.  Use of agents associated with hypertension: none.     Weight trend: stable Wt Readings from Last 3 Encounters:  09/18/16 117 lb 3.2 oz (53.2 kg)  09/16/16 107 lb 11.2 oz (48.9 kg)  09/02/16 119 lb (54 kg)    Current diet: well balanced        Allergies  Allergen Reactions  . Amlodipine Besylate Other (See Comments)    Patient unaware of any allergy with this medicine  . Oxycodone     confusion     Current Outpatient Prescriptions:  .  acetaminophen (TYLENOL) 325 MG tablet, Take 650 mg by mouth every 8 (eight) hours., Disp: , Rfl:  .  Cholecalciferol (VITAMIN D-3) 1000 units CAPS, Take 2,000 Units by mouth daily., Disp: , Rfl:  .  cyanocobalamin 1000 MCG tablet, Take 1,000 mcg by mouth daily., Disp: , Rfl:  .  docusate sodium (COLACE) 100 MG capsule, Take 1 capsule (100 mg total) by mouth 2 (two) times daily., Disp: 60 capsule, Rfl: 0 .  ferrous sulfate 325 (65 FE) MG tablet, Take 325 mg by mouth 2 (two) times daily with a meal. Via PEG tube, Disp: , Rfl:  .  lactulose (CHRONULAC) 10 GM/15ML solution, Take 30 mLs (20 g total) by mouth 2 (two) times daily as needed for moderate constipation or severe constipation., Disp: 240  mL, Rfl: 0 .  lisinopril (PRINIVIL,ZESTRIL) 10 MG tablet, Take 10 mg by mouth daily., Disp: , Rfl:  .  metoprolol succinate (TOPROL-XL) 25 MG 24 hr tablet, Take 12.5 mg by mouth daily. , Disp: , Rfl:  No current facility-administered medications for this visit.   Facility-Administered Medications Ordered in Other Visits:  .  0.9 %  sodium chloride infusion, , Intravenous, Once, Mayra Reel, NP  Review of Systems  Constitutional: Negative for appetite change, chills, fatigue and fever.  Respiratory: Negative for chest tightness and shortness of breath.   Cardiovascular: Negative for chest pain and palpitations.  Gastrointestinal: Negative for abdominal pain, nausea and  vomiting.  Neurological: Negative for dizziness and weakness.    Social History  Substance Use Topics  . Smoking status: Former Smoker    Packs/day: 1.00    Years: 64.00    Types: Cigarettes    Quit date: 06/21/2016  . Smokeless tobacco: Never Used  . Alcohol use 0.0 oz/week     Comment: rarely   Objective:   BP (!) 130/58 (BP Location: Right Arm, Patient Position: Sitting, Cuff Size: Normal)   Pulse 70   Temp 98.4 F (36.9 C) (Oral)   Resp 16   Wt 123 lb (55.8 kg)   SpO2 97%   BMI 21.11 kg/m   Physical Exam   General Appearance:    Alert, cooperative, no distress  Eyes:    PERRL, conjunctiva/corneas clear, EOM's intact       Lungs:     Clear to auscultation bilaterally, respirations unlabored  Heart:    Regular rate and rhythm  Neurologic:   Awake, alert, oriented x 3. No apparent focal neurological           defect.           Assessment & Plan:     1. Essential hypertension Well controlled.  Continue current medications.   - lisinopril (PRINIVIL,ZESTRIL) 10 MG tablet; Take 1 tablet (10 mg total) by mouth daily.  Dispense: 30 tablet; Refill: 5  2. Need for pneumococcal vaccination  - Pneumococcal conjugate vaccine 13-valent IM  3. Vitamin D deficiency To continue OTC vitamin d supplements.   4. Estrogen deficiency Considering recent hip fracture will likely benefit from bisphosphonate. Will check baseline BMD before starting medications.  - DG Bone Density; Future  5. History of non-ST elevation myocardial infarction (NSTEMI) Unclear is this was cause of or result of fall in August, but coronaries were clear on recent cardiac cath.   6. Apical ballooning syndrome   7. Systolic dysfunction Doing well on betablocker and ACEI  8. Tobacco abuse Recently quit smoking which she was congratulated on.   Return in about 3 months (around 01/04/2017).        Lelon Huh, MD  [facility]

## 2016-10-19 ENCOUNTER — Ambulatory Visit (INDEPENDENT_AMBULATORY_CARE_PROVIDER_SITE_OTHER): Payer: Medicare Other | Admitting: Family Medicine

## 2016-10-19 ENCOUNTER — Encounter: Payer: Self-pay | Admitting: Family Medicine

## 2016-10-19 VITALS — BP 126/60 | HR 90 | Temp 97.7°F | Resp 18 | Wt 122.0 lb

## 2016-10-19 DIAGNOSIS — J329 Chronic sinusitis, unspecified: Secondary | ICD-10-CM

## 2016-10-19 MED ORDER — AZITHROMYCIN 250 MG PO TABS: ORAL_TABLET | ORAL | 0 refills | Status: AC

## 2016-10-19 MED ORDER — FLUTICASONE PROPIONATE 50 MCG/ACT NA SUSP: 2.0000 | Freq: Every day | NASAL | 6 refills | Status: DC

## 2016-10-19 MED ORDER — METOPROLOL SUCCINATE ER 25 MG PO TB24: 12.5000 mg | ORAL_TABLET | Freq: Every day | ORAL | 5 refills | Status: DC

## 2016-10-19 NOTE — Patient Instructions (Signed)
   You can take OTC Mucinex DM (guaifenesin-dextrometorphan) for chest or sinus congestion and to help your cough

## 2016-10-19 NOTE — Progress Notes (Signed)
Patient: Emily [name] Female    DOB: 16-Feb-1933   82 y.o.   MRN: [id number] Visit Date: 10/19/2016  Today's Provider: Lelon Huh, MD   Chief Complaint  Patient presents with  . Cough    x 1 week   Subjective:    Cough  This is a new problem. Episode onset: 1 week ago. The problem has been gradually worsening. The cough is productive of sputum (yellow colored ). Associated symptoms include postnasal drip, rhinorrhea, a sore throat and shortness of breath. Pertinent negatives include no chest pain, chills, ear congestion, ear pain, eye redness, fever, headaches, hemoptysis, myalgias, nasal congestion or wheezing. Treatments tried: cough drops. The treatment provided no relief.       Allergies  Allergen Reactions  . Amlodipine Besylate Other (See Comments)    Patient unaware of any allergy with this medicine  . Oxycodone     confusion     Current Outpatient Prescriptions:  .  acetaminophen (TYLENOL) 325 MG tablet, Take 650 mg by mouth every 8 (eight) hours., Disp: , Rfl:  .  Cholecalciferol (VITAMIN D-3) 1000 units CAPS, Take 1,000 Units by mouth daily. , Disp: , Rfl:  .  cyanocobalamin 1000 MCG tablet, Take 1,000 mcg by mouth daily., Disp: , Rfl:  .  docusate sodium (COLACE) 100 MG capsule, Take 1 capsule (100 mg total) by mouth 2 (two) times daily., Disp: 60 capsule, Rfl: 0 .  ferrous sulfate 325 (65 FE) MG tablet, Take 325 mg by mouth 2 (two) times daily with a meal. Via PEG tube, Disp: , Rfl:  .  lactulose (CHRONULAC) 10 GM/15ML solution, Take 30 mLs (20 g total) by mouth 2 (two) times daily as needed for moderate constipation or severe constipation., Disp: 240 mL, Rfl: 0 .  lisinopril (PRINIVIL,ZESTRIL) 10 MG tablet, Take 1 tablet (10 mg total) by mouth daily., Disp: 30 tablet, Rfl: 5 .  metoprolol succinate (TOPROL-XL) 25 MG 24 hr tablet, Take 12.5 mg by mouth daily. , Disp: , Rfl:  No current facility-administered medications for this visit.    Facility-Administered Medications Ordered in Other Visits:  .  0.9 %  sodium chloride infusion, , Intravenous, Once, Mayra Reel, NP  Review of Systems  Constitutional: Negative for appetite change, chills, fatigue and fever.  HENT: Positive for congestion (sinus), postnasal drip, rhinorrhea, sinus pain, sinus pressure and sore throat. Negative for ear pain.   Eyes: Negative for photophobia, pain, discharge, redness, itching and visual disturbance.  Respiratory: Positive for cough and shortness of breath. Negative for hemoptysis, chest tightness and wheezing.   Cardiovascular: Negative for chest pain and palpitations.  Gastrointestinal: Negative for abdominal pain, nausea and vomiting.  Musculoskeletal: Negative for myalgias.  Neurological: Negative for dizziness, weakness and headaches.    Social History  Substance Use Topics  . Smoking status: Former Smoker    Packs/day: 1.00    Years: 64.00    Types: Cigarettes    Quit date: 06/21/2016  . Smokeless tobacco: Never Used  . Alcohol use 0.0 oz/week     Comment: rarely   Objective:   BP 126/60 (BP Location: Left Arm, Patient Position: Sitting, Cuff Size: Normal)   Pulse 90   Temp 97.7 F (36.5 C) (Oral)   Resp 18   Wt 122 lb (55.3 kg)   SpO2 95% Comment: room air  BMI 20.94 kg/m   Physical Exam  General Appearance:    Alert, cooperative, no distress  HENT:   bilateral  TM normal without fluid or infection, neck without nodes, neck has bilateral anterior cervical nodes enlarged, sinuses nontender, post nasal drip noted and nasal mucosa pale and congested  Eyes:    PERRL, conjunctiva/corneas clear, EOM's intact       Lungs:     Clear to auscultation bilaterally, respirations unlabored  Heart:    Regular rate and rhythm  Neurologic:   Awake, alert, oriented x 3. No apparent focal neurological           defect.           Assessment & Plan:     1. Sinusitis, unspecified chronicity, unspecified location  - fluticasone  (FLONASE) 50 MCG/ACT nasal spray; Place 2 sprays into both nostrils daily.  Dispense: 16 g; Refill: 6 - azithromycin (ZITHROMAX) 250 MG tablet; 2 by mouth today, then 1 daily for 4 days  Dispense: 6 tablet; Refill: 0  Call if symptoms change or if not rapidly improving.         The entirety of the information documented in the History of Present Illness, Review of Systems and Physical Exam were personally obtained by me. Portions of this information were initially documented by Meyer Cory, CMA and reviewed by me for thoroughness and accuracy.    Lelon Huh, MD  [facility]

## 2016-11-03 ENCOUNTER — Inpatient Hospital Stay: Payer: Medicare Other | Attending: Oncology

## 2016-11-24 ENCOUNTER — Ambulatory Visit: Payer: Medicare Other

## 2016-12-15 ENCOUNTER — Inpatient Hospital Stay: Payer: Medicare Other | Attending: Oncology

## 2016-12-18 ENCOUNTER — Telehealth: Payer: Self-pay | Admitting: Family Medicine

## 2016-12-18 NOTE — Telephone Encounter (Signed)
Courtney with [facility] said she thinks pt may have gotten 2 does of metoprolol succinate (TOPROL-XL) 25 MG 24 hr tablet And lisinopril (PRINIVIL,ZESTRIL) 10 MG tablet this am.    She said they checked the pt BP and HR and they were fine,  138/60 and 76hr  Their call back is [phone] if you need to talk to them  Thanks Con Memos

## 2016-12-18 NOTE — Telephone Encounter (Signed)
Please review. KW 

## 2016-12-29 ENCOUNTER — Other Ambulatory Visit: Payer: Self-pay

## 2016-12-29 DIAGNOSIS — I10 Essential (primary) hypertension: Secondary | ICD-10-CM

## 2016-12-29 MED ORDER — LISINOPRIL 10 MG PO TABS: 10.0000 mg | ORAL_TABLET | Freq: Every day | ORAL | 3 refills | Status: DC

## 2016-12-29 NOTE — Telephone Encounter (Signed)
[facility] on [address] sent fax requesting 90 day supply. Thanks!

## 2017-01-04 ENCOUNTER — Ambulatory Visit: Payer: Self-pay | Admitting: Family Medicine

## 2017-01-26 ENCOUNTER — Inpatient Hospital Stay: Payer: Medicare Other | Attending: Oncology

## 2017-01-26 MED ORDER — HEPARIN SOD (PORK) LOCK FLUSH 100 UNIT/ML IV SOLN: 500.0000 [IU] | Freq: Once | INTRAVENOUS | Status: AC

## 2017-01-26 MED ORDER — SODIUM CHLORIDE 0.9% FLUSH
10.0000 mL | INTRAVENOUS | Status: AC | PRN
  Filled 2017-01-26: qty 10

## 2017-03-09 ENCOUNTER — Inpatient Hospital Stay: Payer: Medicare Other | Attending: Oncology

## 2017-04-28 DIAGNOSIS — H2512 Age-related nuclear cataract, left eye: Secondary | ICD-10-CM | POA: Diagnosis not present

## 2017-05-17 DIAGNOSIS — H2512 Age-related nuclear cataract, left eye: Secondary | ICD-10-CM | POA: Diagnosis not present

## 2017-05-19 ENCOUNTER — Encounter: Payer: Self-pay | Admitting: *Deleted

## 2017-06-01 ENCOUNTER — Encounter: Payer: Self-pay | Admitting: *Deleted

## 2017-06-01 ENCOUNTER — Encounter
Admission: RE | Disposition: A | Discharge status: Home / Self Care | Payer: Self-pay | Source: Ambulatory Visit | Attending: Ophthalmology

## 2017-06-01 ENCOUNTER — Ambulatory Visit: Payer: Medicare Other | Admitting: Anesthesiology

## 2017-06-01 ENCOUNTER — Ambulatory Visit
Admission: RE | Admit: 2017-06-01 | Discharge: 2017-06-01 | Disposition: A | Discharge status: Home / Self Care | Payer: Medicare Other | Source: Ambulatory Visit | Attending: Ophthalmology | Admitting: Ophthalmology

## 2017-06-01 DIAGNOSIS — I2510 Atherosclerotic heart disease of native coronary artery without angina pectoris: Secondary | ICD-10-CM | POA: Diagnosis not present

## 2017-06-01 DIAGNOSIS — I10 Essential (primary) hypertension: Secondary | ICD-10-CM | POA: Insufficient documentation

## 2017-06-01 DIAGNOSIS — F17200 Nicotine dependence, unspecified, uncomplicated: Secondary | ICD-10-CM | POA: Diagnosis not present

## 2017-06-01 DIAGNOSIS — Z9221 Personal history of antineoplastic chemotherapy: Secondary | ICD-10-CM | POA: Diagnosis not present

## 2017-06-01 DIAGNOSIS — M1990 Unspecified osteoarthritis, unspecified site: Secondary | ICD-10-CM | POA: Diagnosis not present

## 2017-06-01 DIAGNOSIS — H2512 Age-related nuclear cataract, left eye: Secondary | ICD-10-CM | POA: Diagnosis not present

## 2017-06-01 DIAGNOSIS — I252 Old myocardial infarction: Secondary | ICD-10-CM | POA: Diagnosis not present

## 2017-06-01 DIAGNOSIS — Z79899 Other long term (current) drug therapy: Secondary | ICD-10-CM | POA: Insufficient documentation

## 2017-06-01 DIAGNOSIS — Z955 Presence of coronary angioplasty implant and graft: Secondary | ICD-10-CM | POA: Insufficient documentation

## 2017-06-01 DIAGNOSIS — Z923 Personal history of irradiation: Secondary | ICD-10-CM | POA: Diagnosis not present

## 2017-06-01 DIAGNOSIS — K219 Gastro-esophageal reflux disease without esophagitis: Secondary | ICD-10-CM | POA: Insufficient documentation

## 2017-06-01 HISTORY — PX: CATARACT EXTRACTION W/PHACO: SHX586

## 2017-06-01 SURGERY — PHACOEMULSIFICATION, CATARACT, WITH IOL INSERTION
Anesthesia: Monitor Anesthesia Care | Site: Eye | Laterality: Left | Wound class: Clean

## 2017-06-01 MED ORDER — ARMC OPHTHALMIC DILATING DROPS
1.0000 "application " | OPHTHALMIC | Status: AC
  Administered 2017-06-01 (×3): 1 via OPHTHALMIC

## 2017-06-01 MED ORDER — FENTANYL CITRATE (PF) 100 MCG/2ML IJ SOLN
INTRAMUSCULAR | Status: AC
  Filled 2017-06-01: qty 2

## 2017-06-01 MED ORDER — POVIDONE-IODINE 5 % OP SOLN
OPHTHALMIC | Status: DC | PRN
  Administered 2017-06-01: 1 via OPHTHALMIC

## 2017-06-01 MED ORDER — EPINEPHRINE PF 1 MG/ML IJ SOLN
INTRAOCULAR | Status: DC | PRN
  Administered 2017-06-01: 1 mL via OPHTHALMIC

## 2017-06-01 MED ORDER — MOXIFLOXACIN HCL 0.5 % OP SOLN
OPHTHALMIC | Status: AC
  Filled 2017-06-01: qty 3

## 2017-06-01 MED ORDER — FENTANYL CITRATE (PF) 100 MCG/2ML IJ SOLN
INTRAMUSCULAR | Status: DC | PRN
  Administered 2017-06-01 (×2): 25 ug via INTRAVENOUS

## 2017-06-01 MED ORDER — MOXIFLOXACIN HCL 0.5 % OP SOLN: 1.0000 [drp] | OPHTHALMIC | Status: DC | PRN

## 2017-06-01 MED ORDER — ARMC OPHTHALMIC DILATING DROPS
OPHTHALMIC | Status: AC
  Filled 2017-06-01: qty 0.4

## 2017-06-01 MED ORDER — NA CHONDROIT SULF-NA HYALURON 40-17 MG/ML IO SOLN
INTRAOCULAR | Status: DC | PRN
  Administered 2017-06-01: 1 mL via INTRAOCULAR

## 2017-06-01 MED ORDER — CARBACHOL 0.01 % IO SOLN
INTRAOCULAR | Status: DC | PRN
  Administered 2017-06-01: .5 mL via INTRAOCULAR

## 2017-06-01 MED ORDER — MOXIFLOXACIN HCL 0.5 % OP SOLN
OPHTHALMIC | Status: DC | PRN
  Administered 2017-06-01: .2 mL via OPHTHALMIC

## 2017-06-01 MED ORDER — LIDOCAINE HCL (PF) 4 % IJ SOLN
INTRAOCULAR | Status: DC | PRN
  Administered 2017-06-01: 2 mL via OPHTHALMIC

## 2017-06-01 MED ORDER — SODIUM CHLORIDE 0.9 % IV SOLN
INTRAVENOUS | Status: DC
  Administered 2017-06-01: 09:00:00 via INTRAVENOUS

## 2017-06-01 SURGICAL SUPPLY — 16 items
GLOVE BIO SURGEON STRL SZ8 (GLOVE) ×3 IMPLANT
GLOVE BIOGEL M 6.5 STRL (GLOVE) ×3 IMPLANT
GLOVE SURG LX 8.0 MICRO (GLOVE) ×2
GLOVE SURG LX STRL 8.0 MICRO (GLOVE) ×1 IMPLANT
GOWN STRL REUS W/ TWL LRG LVL3 (GOWN DISPOSABLE) ×2 IMPLANT
GOWN STRL REUS W/TWL LRG LVL3 (GOWN DISPOSABLE) ×4
LABEL CATARACT MEDS ST (LABEL) ×3 IMPLANT
LENS IOL TECNIS ITEC 15.5 (Intraocular Lens) ×3 IMPLANT
PACK CATARACT (MISCELLANEOUS) ×3 IMPLANT
PACK CATARACT BRASINGTON LX (MISCELLANEOUS) ×3 IMPLANT
PACK EYE AFTER SURG (MISCELLANEOUS) ×3 IMPLANT
SOL BSS BAG (MISCELLANEOUS) ×3
SOLUTION BSS BAG (MISCELLANEOUS) ×1 IMPLANT
SYR 5ML LL (SYRINGE) ×3 IMPLANT
WATER STERILE IRR 250ML POUR (IV SOLUTION) ×3 IMPLANT
WIPE NON LINTING 3.25X3.25 (MISCELLANEOUS) ×3 IMPLANT

## 2017-06-01 NOTE — OR Nursing (Signed)
Pt poor historian - anesthesiologist notified of difficulty obtained med info

## 2017-06-01 NOTE — Op Note (Signed)
PREOPERATIVE DIAGNOSIS:  Nuclear sclerotic cataract of the left eye.   POSTOPERATIVE DIAGNOSIS:  Nuclear sclerotic cataract of the left eye.   OPERATIVE PROCEDURE: Procedure(s): CATARACT EXTRACTION PHACO AND INTRAOCULAR LENS PLACEMENT (IOC)   SURGEON:  Birder Robson, MD.   ANESTHESIA:  Anesthesiologist: Piscitello, Precious Haws, MD CRNA: Darlyne Russian, CRNA; Johnna Acosta, CRNA  1.      Managed anesthesia care. 2.     0.34ml of Shugarcaine was instilled following the paracentesis   COMPLICATIONS:  None.   TECHNIQUE:   Stop and chop   DESCRIPTION OF PROCEDURE:  The patient was examined and consented in the preoperative holding area where the aforementioned topical anesthesia was applied to the left eye and then brought back to the Operating Room where the left eye was prepped and draped in the usual sterile ophthalmic fashion and a lid speculum was placed. A paracentesis was created with the side port blade and the anterior chamber was filled with viscoelastic. A near clear corneal incision was performed with the steel keratome. A continuous curvilinear capsulorrhexis was performed with a cystotome followed by the capsulorrhexis forceps. Hydrodissection and hydrodelineation were carried out with BSS on a blunt cannula. The lens was removed in a stop and chop  technique and the remaining cortical material was removed with the irrigation-aspiration handpiece. The capsular bag was inflated with viscoelastic and the Technis ZCB00 lens was placed in the capsular bag without complication. The remaining viscoelastic was removed from the eye with the irrigation-aspiration handpiece. The wounds were hydrated. The anterior chamber was flushed with Miostat and the eye was inflated to physiologic pressure. 0.51ml Vigamox was placed in the anterior chamber. The wounds were found to be water tight. The eye was dressed with Vigamox. The patient was given protective glasses to wear throughout the day and a  shield with which to sleep tonight. The patient was also given drops with which to begin a drop regimen today and will follow-up with me in one day.  Implant Name Type Inv. Item Serial No. Manufacturer Lot No. LRB No. Used  LENS IOL DIOP 15.5 - [id number] Intraocular Lens LENS IOL DIOP 15.5 [id number] AMO   Left 1    Procedure(s) with comments: CATARACT EXTRACTION PHACO AND INTRAOCULAR LENS PLACEMENT (IOC) (Left) - [place] 00:55 AP% 19.4 CDE 10.71 Fluid pack lot [id number]  Electronically signed: [place] 06/01/2017 10:03 AM

## 2017-06-01 NOTE — Discharge Instructions (Signed)
Eye Surgery Discharge Instructions  Expect mild scratchy sensation or mild soreness. DO NOT RUB YOUR EYE!  The day of surgery:  Minimal physical activity, but bed rest is not required  No reading, computer work, or close hand work  No bending, lifting, or straining.  May watch TV  For 24 hours:  No driving, legal decisions, or alcoholic beverages  Safety precautions  Eat anything you prefer: It is better to start with liquids, then soup then solid foods.  _____ Eye patch should be worn until postoperative exam tomorrow.  ____ Solar shield eyeglasses should be worn for comfort in the sunlight/patch while sleeping  Resume all regular medications including aspirin or Coumadin if these were discontinued prior to surgery. You may shower, bathe, shave, or wash your hair. Tylenol may be taken for mild discomfort.  Call your doctor if you experience significant pain, nausea, or vomiting, fever > 101 or other signs of infection. [phone] or [phone] Specific instructions:  Follow-up Information    Birder Robson, MD Follow up.   Specialty:  Ophthalmology Why:  August 1 at 9:10am Contact information: [address] [place] [address] [phone]

## 2017-06-01 NOTE — Anesthesia Preprocedure Evaluation (Signed)
Anesthesia Evaluation  Patient identified by MRN, date of birth, ID band Patient awake and Patient confused    Reviewed: Allergy & Precautions, NPO status , Patient's Chart, lab work & pertinent test results  History of Anesthesia Complications (+) DIFFICULT AIRWAYHistory of anesthetic complications: previous intubation record reviewed, grade 2 with miller 2 blade.  Airway Mallampati: II  TM Distance: >3 FB Neck ROM: Full    Dental  (+) Upper Dentures, Partial Lower   Pulmonary neg COPD, Current Smoker,    breath sounds clear to auscultation- rhonchi (-) wheezing      Cardiovascular Exercise Tolerance: Poor hypertension, Pt. on medications and Pt. on home beta blockers + Past MI ( NSTEMI, cath showed no stenosis)  (-) Cardiac Stents and (-) CABG  Rhythm:Regular Rate:Normal - Systolic murmurs and - Diastolic murmurs L heart cath 06/23/16: Left ventricular angiogram shows abnormal LV systolic function ejection fraction of 30% with apical ballooning  no  coronary artery disease with 0% stenosis of coronary arteries  There is significant LV systolic dysfunction consistent with apical ballooning syndrome likely due to the stress of current illness and event with fall and hip fracture  Echo 06/22/16: Left ventricle: The cavity size was moderately dilated. Systolic   function was severely reduced. The estimated ejection fraction   was in the range of 25% to 30%. Akinesis of the apical   myocardium. - Aortic valve: Valve area (Vmax): 2.07 cm^2.   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[place]) No date: Colon cancer ([place]) No date: Difficult intubation No date: Diverticulosis No date: Hemorrhoids No date: History of chicken pox No date: Hypertension No date: Osteopenia No date: Status post chemotherapy     Comment: colon cancer No date: Status post radiation therapy     Comment: colon cancer 2013   Reproductive/Obstetrics                             Anesthesia Physical  Anesthesia Plan  ASA: III  Anesthesia Plan: MAC   Post-op Pain Management:    Induction: Intravenous  PONV Risk Score and Plan:   Airway Management Planned: Natural Airway and Nasal Cannula  Additional Equipment:   Intra-op Plan:   Post-operative Plan:   Informed Consent: I have reviewed the patients History and Physical, chart, labs and discussed the procedure including the risks, benefits and alternatives for the proposed anesthesia with the patient or authorized representative who has indicated his/her understanding and acceptance.   Dental advisory given  Plan Discussed with: CRNA and Anesthesiologist  Anesthesia Plan Comments: (Patient is poor historian, by reports this seems to be baseline  Patient consented for risks of anesthesia including but not limited to:  - adverse reactions to medications - damage to teeth, lips or other oral mucosa - sore throat or hoarseness - Damage to heart, brain, lungs or loss of life  Patient voiced understanding.)  Lab Results  Component Value Date   WBC 5.0 09/02/2016   HGB 10.8 (A) 09/02/2016   HCT 33 (A) 09/02/2016   MCV 94.3 07/20/2016   PLT 211 09/02/2016    Anesthesia Quick Evaluation

## 2017-06-01 NOTE — Anesthesia Post-op Follow-up Note (Cosign Needed)
Anesthesia QCDR form completed.        

## 2017-06-01 NOTE — OR Nursing (Signed)
Pt has port in chest

## 2017-06-01 NOTE — Transfer of Care (Signed)
Immediate Anesthesia Transfer of Care Note  Patient: Emily [name]  Procedure(s) Performed: Procedure(s) with comments: CATARACT EXTRACTION PHACO AND INTRAOCULAR LENS PLACEMENT (IOC) (Left) - [place] 00:55 AP% 19.4 CDE 10.71 Fluid pack lot [id number]  Patient Location: PACU  Anesthesia Type:MAC  Level of Consciousness: awake, alert  and oriented  Airway & Oxygen Therapy: Patient Spontanous Breathing  Post-op Assessment: Report given to RN and Post -op Vital signs reviewed and stable  Post vital signs: Reviewed and stable  Last Vitals:  Vitals:   06/01/17 0835 06/01/17 1006  BP: 127/61 (!) 120/41  Pulse: 85 65  Resp: 18 18  Temp: 36.6 C     Last Pain:  Vitals:   06/01/17 1006  TempSrc: Oral         Complications: No apparent anesthesia complications

## 2017-06-01 NOTE — H&P (Signed)
All labs reviewed. Abnormal studies sent to patients PCP when indicated.  Previous H&P reviewed, patient examined, there are NO CHANGES.  Shaida Route LOUIS7/31/20189:39 AM

## 2017-06-01 NOTE — Anesthesia Postprocedure Evaluation (Signed)
Anesthesia Post Note  Patient: Emily [name]  Procedure(s) Performed: Procedure(s) (LRB): CATARACT EXTRACTION PHACO AND INTRAOCULAR LENS PLACEMENT (IOC) (Left)  Patient location during evaluation: PACU Anesthesia Type: MAC Level of consciousness: awake and alert Pain management: pain level controlled Vital Signs Assessment: post-procedure vital signs reviewed and stable Respiratory status: spontaneous breathing, nonlabored ventilation, respiratory function stable and patient connected to nasal cannula oxygen Cardiovascular status: stable and blood pressure returned to baseline Anesthetic complications: no     Last Vitals:  Vitals:   06/01/17 0835 06/01/17 1006  BP: 127/61 (!) 120/41  Pulse: 85 65  Resp: 18 18  Temp: 36.6 C     Last Pain:  Vitals:   06/01/17 1006  TempSrc: Oral                 Darlyne Russian

## 2017-06-01 NOTE — Anesthesia Procedure Notes (Signed)
Performed by: Georgiana Spillane       

## 2017-06-28 DIAGNOSIS — H2511 Age-related nuclear cataract, right eye: Secondary | ICD-10-CM | POA: Diagnosis not present

## 2017-06-29 ENCOUNTER — Encounter: Payer: Self-pay | Admitting: *Deleted

## 2017-07-06 ENCOUNTER — Ambulatory Visit: Payer: Medicare Other | Admitting: Anesthesiology

## 2017-07-06 ENCOUNTER — Encounter: Payer: Self-pay | Admitting: Anesthesiology

## 2017-07-06 ENCOUNTER — Ambulatory Visit
Admission: RE | Admit: 2017-07-06 | Discharge: 2017-07-06 | Disposition: A | Discharge status: Home / Self Care | Payer: Medicare Other | Source: Ambulatory Visit | Attending: Ophthalmology | Admitting: Ophthalmology

## 2017-07-06 ENCOUNTER — Encounter
Admission: RE | Disposition: A | Discharge status: Home / Self Care | Payer: Self-pay | Source: Ambulatory Visit | Attending: Ophthalmology

## 2017-07-06 DIAGNOSIS — Z885 Allergy status to narcotic agent status: Secondary | ICD-10-CM | POA: Diagnosis not present

## 2017-07-06 DIAGNOSIS — I252 Old myocardial infarction: Secondary | ICD-10-CM | POA: Diagnosis not present

## 2017-07-06 DIAGNOSIS — Z888 Allergy status to other drugs, medicaments and biological substances status: Secondary | ICD-10-CM | POA: Diagnosis not present

## 2017-07-06 DIAGNOSIS — H2511 Age-related nuclear cataract, right eye: Secondary | ICD-10-CM | POA: Diagnosis not present

## 2017-07-06 DIAGNOSIS — M1990 Unspecified osteoarthritis, unspecified site: Secondary | ICD-10-CM | POA: Diagnosis not present

## 2017-07-06 DIAGNOSIS — Z8589 Personal history of malignant neoplasm of other organs and systems: Secondary | ICD-10-CM | POA: Insufficient documentation

## 2017-07-06 DIAGNOSIS — Z85038 Personal history of other malignant neoplasm of large intestine: Secondary | ICD-10-CM | POA: Diagnosis not present

## 2017-07-06 DIAGNOSIS — Z79899 Other long term (current) drug therapy: Secondary | ICD-10-CM | POA: Insufficient documentation

## 2017-07-06 DIAGNOSIS — R0602 Shortness of breath: Secondary | ICD-10-CM | POA: Insufficient documentation

## 2017-07-06 DIAGNOSIS — Z87891 Personal history of nicotine dependence: Secondary | ICD-10-CM | POA: Insufficient documentation

## 2017-07-06 DIAGNOSIS — I10 Essential (primary) hypertension: Secondary | ICD-10-CM | POA: Diagnosis not present

## 2017-07-06 DIAGNOSIS — K219 Gastro-esophageal reflux disease without esophagitis: Secondary | ICD-10-CM | POA: Diagnosis not present

## 2017-07-06 HISTORY — PX: CATARACT EXTRACTION W/PHACO: SHX586

## 2017-07-06 HISTORY — DX: Unspecified hearing loss, unspecified ear: H91.90

## 2017-07-06 HISTORY — DX: Dyspnea, unspecified: R06.00

## 2017-07-06 SURGERY — PHACOEMULSIFICATION, CATARACT, WITH IOL INSERTION
Anesthesia: Monitor Anesthesia Care | Site: Eye | Laterality: Right | Wound class: Clean

## 2017-07-06 MED ORDER — FENTANYL CITRATE (PF) 100 MCG/2ML IJ SOLN
INTRAMUSCULAR | Status: DC | PRN
  Administered 2017-07-06: 25 ug via INTRAVENOUS

## 2017-07-06 MED ORDER — POVIDONE-IODINE 5 % OP SOLN
OPHTHALMIC | Status: DC | PRN
  Administered 2017-07-06: 1 via OPHTHALMIC

## 2017-07-06 MED ORDER — EPINEPHRINE PF 1 MG/ML IJ SOLN
INTRAOCULAR | Status: DC | PRN
  Administered 2017-07-06: 13:00:00 via OPHTHALMIC

## 2017-07-06 MED ORDER — MIDAZOLAM HCL 2 MG/2ML IJ SOLN
INTRAMUSCULAR | Status: AC
  Filled 2017-07-06: qty 2

## 2017-07-06 MED ORDER — NA CHONDROIT SULF-NA HYALURON 40-17 MG/ML IO SOLN
INTRAOCULAR | Status: DC | PRN
Start: 2017-07-06 — End: 2017-07-06
  Administered 2017-07-06: 1 mL via INTRAOCULAR

## 2017-07-06 MED ORDER — LIDOCAINE HCL (PF) 4 % IJ SOLN
INTRAMUSCULAR | Status: AC
  Filled 2017-07-06: qty 5

## 2017-07-06 MED ORDER — NA CHONDROIT SULF-NA HYALURON 40-17 MG/ML IO SOLN
INTRAOCULAR | Status: AC
  Filled 2017-07-06: qty 1

## 2017-07-06 MED ORDER — EPINEPHRINE PF 1 MG/ML IJ SOLN
INTRAMUSCULAR | Status: AC
  Filled 2017-07-06: qty 2

## 2017-07-06 MED ORDER — ARMC OPHTHALMIC DILATING DROPS
1.0000 "application " | OPHTHALMIC | Status: AC
  Administered 2017-07-06 (×3): 1 via OPHTHALMIC

## 2017-07-06 MED ORDER — POVIDONE-IODINE 5 % OP SOLN
OPHTHALMIC | Status: AC
  Filled 2017-07-06: qty 30

## 2017-07-06 MED ORDER — LIDOCAINE HCL (PF) 4 % IJ SOLN
INTRAMUSCULAR | Status: DC | PRN
  Administered 2017-07-06: 4 mL via OPHTHALMIC

## 2017-07-06 MED ORDER — FENTANYL CITRATE (PF) 100 MCG/2ML IJ SOLN
INTRAMUSCULAR | Status: AC
  Filled 2017-07-06: qty 2

## 2017-07-06 MED ORDER — CARBACHOL 0.01 % IO SOLN
INTRAOCULAR | Status: DC | PRN
  Administered 2017-07-06: 0.5 mL via INTRAOCULAR

## 2017-07-06 MED ORDER — MOXIFLOXACIN HCL 0.5 % OP SOLN: 1.0000 [drp] | OPHTHALMIC | Status: DC | PRN

## 2017-07-06 MED ORDER — SODIUM CHLORIDE 0.9 % IV SOLN
INTRAVENOUS | Status: DC
  Administered 2017-07-06: 12:00:00 via INTRAVENOUS

## 2017-07-06 MED ORDER — MIDAZOLAM HCL 2 MG/2ML IJ SOLN
INTRAMUSCULAR | Status: DC | PRN
  Administered 2017-07-06: 1 mg via INTRAVENOUS

## 2017-07-06 MED ORDER — MOXIFLOXACIN HCL 0.5 % OP SOLN
OPHTHALMIC | Status: DC | PRN
  Administered 2017-07-06: 0.2 mL via OPHTHALMIC

## 2017-07-06 SURGICAL SUPPLY — 16 items
GLOVE BIO SURGEON STRL SZ8 (GLOVE) ×3 IMPLANT
GLOVE BIOGEL M 6.5 STRL (GLOVE) ×3 IMPLANT
GLOVE SURG LX 8.0 MICRO (GLOVE) ×2
GLOVE SURG LX STRL 8.0 MICRO (GLOVE) ×1 IMPLANT
GOWN STRL REUS W/ TWL LRG LVL3 (GOWN DISPOSABLE) ×2 IMPLANT
GOWN STRL REUS W/TWL LRG LVL3 (GOWN DISPOSABLE) ×4
LABEL CATARACT MEDS ST (LABEL) ×3 IMPLANT
LENS IOL TECNIS ITEC 15.5 (Intraocular Lens) ×3 IMPLANT
PACK CATARACT (MISCELLANEOUS) ×3 IMPLANT
PACK CATARACT BRASINGTON LX (MISCELLANEOUS) ×3 IMPLANT
PACK EYE AFTER SURG (MISCELLANEOUS) ×3 IMPLANT
SOL BSS BAG (MISCELLANEOUS) ×3
SOLUTION BSS BAG (MISCELLANEOUS) ×1 IMPLANT
SYR 5ML LL (SYRINGE) ×3 IMPLANT
WATER STERILE IRR 250ML POUR (IV SOLUTION) ×3 IMPLANT
WIPE NON LINTING 3.25X3.25 (MISCELLANEOUS) ×3 IMPLANT

## 2017-07-06 NOTE — Op Note (Signed)
PREOPERATIVE DIAGNOSIS:  Nuclear sclerotic cataract of the right eye.   POSTOPERATIVE DIAGNOSIS:  NUCLEAR SCLEROTIC CATARACT RIGHT EYE   OPERATIVE PROCEDURE: Procedure(s): CATARACT EXTRACTION PHACO AND INTRAOCULAR LENS PLACEMENT (IOC)   SURGEON:  Birder Robson, MD.   ANESTHESIA:  Anesthesiologist: Gunnar Bulla, MD CRNA: Jonna Clark, CRNA  1.      Managed anesthesia care. 2.      0.67ml of Shugarcaine was instilled in the eye following the paracentesis.   COMPLICATIONS:  None.   TECHNIQUE:   Stop and chop   DESCRIPTION OF PROCEDURE:  The patient was examined and consented in the preoperative holding area where the aforementioned topical anesthesia was applied to the right eye and then brought back to the Operating Room where the right eye was prepped and draped in the usual sterile ophthalmic fashion and a lid speculum was placed. A paracentesis was created with the side port blade and the anterior chamber was filled with viscoelastic. A near clear corneal incision was performed with the steel keratome. A continuous curvilinear capsulorrhexis was performed with a cystotome followed by the capsulorrhexis forceps. Hydrodissection and hydrodelineation were carried out with BSS on a blunt cannula. The lens was removed in a stop and chop  technique and the remaining cortical material was removed with the irrigation-aspiration handpiece. The capsular bag was inflated with viscoelastic and the Technis ZCB00  lens was placed in the capsular bag without complication. The remaining viscoelastic was removed from the eye with the irrigation-aspiration handpiece. The wounds were hydrated. The anterior chamber was flushed with Miostat and the eye was inflated to physiologic pressure. 0.66ml of Vigamox was placed in the anterior chamber. The wounds were found to be water tight. The eye was dressed with Vigamox. The patient was given protective glasses to wear throughout the day and a shield with which  to sleep tonight. The patient was also given drops with which to begin a drop regimen today and will follow-up with me in one day.  Implant Name Type Inv. Item Serial No. Manufacturer Lot No. LRB No. Used  LENS IOL DIOP 15.5 - [id number] Intraocular Lens LENS IOL DIOP 15.5 [id number] AMO   Right 1   Procedure(s) with comments: CATARACT EXTRACTION PHACO AND INTRAOCULAR LENS PLACEMENT (IOC) (Right) - [place]  00:44.7 AP% 18.2 CDE 8.10 Fluid Pack lot # [id number]  Electronically signed: Tedd Cottrill LOUIS 07/06/2017 1:25 PM

## 2017-07-06 NOTE — Anesthesia Preprocedure Evaluation (Addendum)
Anesthesia Evaluation  Patient identified by MRN, date of birth, ID band Patient awake    Reviewed: Allergy & Precautions, NPO status , Patient's Chart, lab work & pertinent test results, reviewed documented beta blocker date and time   History of Anesthesia Complications (+) DIFFICULT AIRWAY  Airway Mallampati: II  TM Distance: >3 FB     Dental  (+) Chipped, Upper Dentures, Partial Lower   Pulmonary shortness of breath, former smoker,           Cardiovascular hypertension, Pt. on medications and Pt. on home beta blockers      Neuro/Psych    GI/Hepatic GERD  Controlled,  Endo/Other    Renal/GU      Musculoskeletal  (+) Arthritis ,   Abdominal   Peds  Hematology   Anesthesia Other Findings NSTEMI in the past. EF 30.Hx of difficult intubation. Colon ca.Throat ca. PEG has been removed.  Reproductive/Obstetrics                            Anesthesia Physical Anesthesia Plan  ASA: III  Anesthesia Plan: MAC   Post-op Pain Management:    Induction:   PONV Risk Score and Plan:   Airway Management Planned:   Additional Equipment:   Intra-op Plan:   Post-operative Plan:   Informed Consent: I have reviewed the patients History and Physical, chart, labs and discussed the procedure including the risks, benefits and alternatives for the proposed anesthesia with the patient or authorized representative who has indicated his/her understanding and acceptance.     Plan Discussed with: CRNA  Anesthesia Plan Comments:         Anesthesia Quick Evaluation

## 2017-07-06 NOTE — H&P (Signed)
All labs reviewed. Abnormal studies sent to patients PCP when indicated.  Previous H&P reviewed, patient examined, there are NO CHANGES.  Maryjane Benedict LOUIS9/4/201812:34 PM

## 2017-07-06 NOTE — Discharge Instructions (Signed)
FOLLOW DR. PORFILIO'S POSTOP DISCHARGE INSTRUCTIONS AS REVIEWED.  Eye Surgery Discharge Instructions  Expect mild scratchy sensation or mild soreness. DO NOT RUB YOUR EYE!  The day of surgery:  Minimal physical activity, but bed rest is not required  No reading, computer work, or close hand work  No bending, lifting, or straining.  May watch TV  For 24 hours:  No driving, legal decisions, or alcoholic beverages  Safety precautions  Eat anything you prefer: It is better to start with liquids, then soup then solid foods.  _____ Eye patch should be worn until postoperative exam tomorrow.  ____ Solar shield eyeglasses should be worn for comfort in the sunlight/patch while sleeping  Resume all regular medications including aspirin or Coumadin if these were discontinued prior to surgery. You may shower, bathe, shave, or wash your hair. Tylenol may be taken for mild discomfort.  Call your doctor if you experience significant pain, nausea, or vomiting, fever > 101 or other signs of infection. [phone] or [phone] Specific instructions:  Follow-up Information    Birder Robson, MD Follow up.   Specialty:  Ophthalmology Why:  07/07/17 @ 10:35 am Contact information: [place] [address] [phone]

## 2017-07-06 NOTE — Anesthesia Postprocedure Evaluation (Signed)
Anesthesia Post Note  Patient: Emily [name]  Procedure(s) Performed: Procedure(s) (LRB): CATARACT EXTRACTION PHACO AND INTRAOCULAR LENS PLACEMENT (IOC) (Right)  Patient location during evaluation: Short Stay Anesthesia Type: MAC Level of consciousness: awake and alert and oriented Pain management: pain level controlled Vital Signs Assessment: post-procedure vital signs reviewed and stable Respiratory status: spontaneous breathing and respiratory function stable Cardiovascular status: stable Postop Assessment: no headache and adequate PO intake Anesthetic complications: no     Last Vitals:  Vitals:   07/06/17 1326 07/06/17 1328  BP: (!) 115/52 (!) 115/52  Pulse: 60 (!) 58  Resp: 16 16  Temp: (!) 36.1 C   SpO2: 99% 100%    Last Pain:  Vitals:   07/06/17 1326  TempSrc: Temporal                 Lanora Manis

## 2017-07-06 NOTE — Transfer of Care (Signed)
Immediate Anesthesia Transfer of Care Note  Patient: Emily [name]  Procedure(s) Performed: Procedure(s) with comments: CATARACT EXTRACTION PHACO AND INTRAOCULAR LENS PLACEMENT (IOC) (Right) - [place]  00:44.7 AP% 18.2 CDE 8.10 Fluid Pack lot # [id number]  Patient Location: PACU and Short Stay  Anesthesia Type:MAC  Level of Consciousness: awake, alert  and oriented  Airway & Oxygen Therapy: Patient Spontanous Breathing  Post-op Assessment: Report given to RN and Post -op Vital signs reviewed and stable  Post vital signs: Reviewed and stable  Last Vitals:  Vitals:   07/06/17 1326 07/06/17 1328  BP: (!) 115/52 (!) 115/52  Pulse: 60 (!) 58  Resp: 16 16  Temp: (!) 36.1 C   SpO2: 99% 100%    Last Pain:  Vitals:   07/06/17 1326  TempSrc: Temporal         Complications: No apparent anesthesia complications

## 2017-07-06 NOTE — Anesthesia Post-op Follow-up Note (Signed)
Anesthesia QCDR form completed.        

## 2017-07-07 ENCOUNTER — Encounter: Payer: Self-pay | Admitting: Ophthalmology

## 2017-07-20 ENCOUNTER — Telehealth: Payer: Self-pay | Admitting: Family Medicine

## 2017-08-02 NOTE — Telephone Encounter (Signed)
Doesn't want AWV

## 2017-10-21 DIAGNOSIS — J310 Chronic rhinitis: Secondary | ICD-10-CM | POA: Diagnosis not present

## 2017-10-21 DIAGNOSIS — Z85818 Personal history of malignant neoplasm of other sites of lip, oral cavity, and pharynx: Secondary | ICD-10-CM | POA: Diagnosis not present

## 2017-10-21 DIAGNOSIS — Z08 Encounter for follow-up examination after completed treatment for malignant neoplasm: Secondary | ICD-10-CM | POA: Diagnosis not present

## 2017-10-21 DIAGNOSIS — Z87891 Personal history of nicotine dependence: Secondary | ICD-10-CM | POA: Diagnosis not present

## 2017-11-07 ENCOUNTER — Other Ambulatory Visit: Payer: Self-pay | Admitting: Family Medicine

## 2017-12-12 ENCOUNTER — Other Ambulatory Visit: Payer: Self-pay | Admitting: Family Medicine

## 2018-01-10 ENCOUNTER — Other Ambulatory Visit: Payer: Self-pay | Admitting: Family Medicine

## 2018-01-31 ENCOUNTER — Other Ambulatory Visit: Payer: Self-pay | Admitting: Family Medicine

## 2018-01-31 DIAGNOSIS — I10 Essential (primary) hypertension: Secondary | ICD-10-CM

## 2018-03-07 ENCOUNTER — Other Ambulatory Visit: Payer: Self-pay | Admitting: Family Medicine

## 2018-03-07 DIAGNOSIS — I10 Essential (primary) hypertension: Secondary | ICD-10-CM

## 2018-03-08 MED ORDER — METOPROLOL SUCCINATE ER 25 MG PO TB24: ORAL_TABLET | ORAL | 2 refills | Status: DC

## 2018-03-08 NOTE — Telephone Encounter (Signed)
Patient not seen since 2017. Please advise have sent 30 day refill, but she needs to schedule o.v. Within the next month or we cannot continue to refill medications.

## 2018-03-08 NOTE — Telephone Encounter (Signed)
Patient scheduled ov 03/16/2018 at 10:40 am.

## 2018-03-16 ENCOUNTER — Ambulatory Visit (INDEPENDENT_AMBULATORY_CARE_PROVIDER_SITE_OTHER): Payer: Medicare Other | Admitting: Family Medicine

## 2018-03-16 ENCOUNTER — Telehealth: Payer: Self-pay | Admitting: Family Medicine

## 2018-03-16 ENCOUNTER — Encounter: Payer: Self-pay | Admitting: Family Medicine

## 2018-03-16 VITALS — BP 122/62 | HR 83 | Temp 97.6°F | Resp 16 | Wt 131.0 lb

## 2018-03-16 DIAGNOSIS — I10 Essential (primary) hypertension: Secondary | ICD-10-CM

## 2018-03-16 DIAGNOSIS — E559 Vitamin D deficiency, unspecified: Secondary | ICD-10-CM | POA: Diagnosis not present

## 2018-03-16 DIAGNOSIS — C139 Malignant neoplasm of hypopharynx, unspecified: Secondary | ICD-10-CM | POA: Diagnosis not present

## 2018-03-16 DIAGNOSIS — R413 Other amnesia: Secondary | ICD-10-CM | POA: Diagnosis not present

## 2018-03-16 DIAGNOSIS — C20 Malignant neoplasm of rectum: Secondary | ICD-10-CM | POA: Diagnosis not present

## 2018-03-16 DIAGNOSIS — R1311 Dysphagia, oral phase: Secondary | ICD-10-CM | POA: Diagnosis not present

## 2018-03-16 DIAGNOSIS — E2839 Other primary ovarian failure: Secondary | ICD-10-CM

## 2018-03-16 MED ORDER — DONEPEZIL HCL 5 MG PO TABS: 5.0000 mg | ORAL_TABLET | Freq: Every day | ORAL | 4 refills | Status: AC

## 2018-03-16 NOTE — Telephone Encounter (Signed)
She doesn't have an oncologist anymore. Has been followed by Dr. Richardson Landry at [facility]. You can send his note from 10/21/2017 which states she needs to follow up with Dr. Bary Castilla to have port removed.

## 2018-03-16 NOTE — Progress Notes (Signed)
Patient: Emily [name] Female    DOB: [birth year]   84 y.o.   MRN: [id number] Visit Date: 03/16/2018  Today's Provider: Lelon Huh, MD   No chief complaint on file.  Subjective:    HPI   Hypertension, follow-up:  BP Readings from Last 3 Encounters:  07/06/17 (!) 137/58  06/01/17 (!) 117/52  09/18/16 136/76    She was last seen for hypertension 10/06/2016.  BP at that visit was 130/58. Management since that visit includes; no changes.She reports good compliance with treatment. She is not having side effects.  She is not exercising. She is not adherent to low salt diet.   Outside blood pressures are not being checked. She is experiencing none.  Patient denies chest pain, chest pressure/discomfort, claudication, dyspnea, exertional chest pressure/discomfort, fatigue, irregular heart beat, lower extremity edema, near-syncope, orthopnea, palpitations, paroxysmal nocturnal dyspnea, syncope and tachypnea.   Cardiovascular risk factors include hypertension.  Use of agents associated with hypertension: none.   ------------------------------------------------------------------------  Vitamin D deficiency From 10/06/2016-no changes.To continue OTC vitamin d supplements. Patient reports good compliance with treatment.   She is here with her son today with multiple additional complaints. They report she is having increasing difficulties with her memory and would like to take something to help with memory.   She also requests for referral to have port-a-cath remove since she completed chemotherapy two years ago. Her last visit with Dr. Richardson Landry was in December and he recommended she follow up with Dr. Bary Castilla to have it removed.    Allergies  Allergen Reactions  . Amlodipine Besylate Other (See Comments)    Patient unaware of any allergy with this medicine  . Oxycodone     confusion     Current Outpatient Medications:  .  acetaminophen (TYLENOL) 325 MG tablet,  Take 650 mg by mouth every 8 (eight) hours., Disp: , Rfl:  .  Cholecalciferol (VITAMIN D-3) 1000 units CAPS, Take 1,000 Units by mouth daily. , Disp: , Rfl:  .  cyanocobalamin 1000 MCG tablet, Take 1,000 mcg by mouth daily., Disp: , Rfl:  .  docusate sodium (COLACE) 100 MG capsule, Take 1 capsule (100 mg total) by mouth 2 (two) times daily. (Patient not taking: Reported on 07/06/2017), Disp: 60 capsule, Rfl: 0 .  ferrous sulfate 325 (65 FE) MG tablet, Take 325 mg by mouth 2 (two) times daily with a meal. Via PEG tube, Disp: , Rfl:  .  fluticasone (FLONASE) 50 MCG/ACT nasal spray, Place 2 sprays into both nostrils daily. (Patient not taking: Reported on 07/06/2017), Disp: 16 g, Rfl: 6 .  lactulose (CHRONULAC) 10 GM/15ML solution, Take 30 mLs (20 g total) by mouth 2 (two) times daily as needed for moderate constipation or severe constipation. (Patient not taking: Reported on 07/06/2017), Disp: 240 mL, Rfl: 0 .  lisinopril (PRINIVIL,ZESTRIL) 10 MG tablet, TAKE 1 TABLET BY MOUTH ONCE DAILY *PATIENT NEEDS TO SCHEDULE OFFICE VISIT FOR FOLLOW UP*, Disp: 30 tablet, Rfl: 0 .  metoprolol succinate (TOPROL-XL) 25 MG 24 hr tablet, TAKE 1/2 (ONE-HALF) TABLET BY MOUTH ONCE DAILY SCHEDULE  OFFICE  VISIT  FOR  FOLLOW  UP, Disp: 15 tablet, Rfl: 2 No current facility-administered medications for this visit.   Facility-Administered Medications Ordered in Other Visits:  .  heparin lock flush 100 unit/mL, 500 Units, Intravenous, Once, Brahmanday, Govinda R, MD .  sodium chloride flush (NS) 0.9 % injection 10 mL, 10 mL, Intravenous, PRN, Cammie Sickle, MD  Review of  Systems  Constitutional: Negative for appetite change, chills, fatigue and fever.  Respiratory: Negative for chest tightness and shortness of breath.   Cardiovascular: Negative for chest pain and palpitations.  Gastrointestinal: Negative for abdominal pain, nausea and vomiting.  Neurological: Negative for dizziness and weakness.  Psychiatric/Behavioral:  Positive for confusion.       Trouble  With short term memory    Social History   Tobacco Use  . Smoking status: Former Smoker    Packs/day: 1.00    Years: 64.00    Pack years: 64.00    Types: Cigarettes  . Smokeless tobacco: Never Used  Substance Use Topics  . Alcohol use: Yes    Alcohol/week: 0.0 oz    Comment: rarely   Objective:   BP 122/62 (BP Location: Left Arm, Patient Position: Sitting, Cuff Size: Large)   Pulse 83   Temp 97.6 F (36.4 C) (Oral)   Resp 16   Wt 131 lb (59.4 kg)   SpO2 96% Comment: room air  BMI 21.80 kg/m  There were no vitals filed for this visit.   Physical Exam   General Appearance:    Alert, cooperative, no distress  Eyes:    PERRL, conjunctiva/corneas clear, EOM's intact       Lungs:     Clear to auscultation bilaterally, respirations unlabored  Heart:    Regular rate and rhythm  Neurologic:   Awake, alert, oriented x 3. No apparent focal neurological           defect.           Assessment & Plan:      1. Memory difficulty  - TSH - donepezil (ARICEPT) 5 MG tablet; Take 1 tablet (5 mg total) by mouth at bedtime.  Dispense: 30 tablet; Refill: 4 - CBC  2. Essential hypertension Well controlled.  Continue current medications.    3. Vitamin D deficiency  - VITAMIN D 25 Hydroxy (Vit-D Deficiency, Fractures)  4. Estrogen deficiency  - DG Bone Density; Future  5. Rectal cancer ([place])   6. Cancer of contiguous sites of hypopharynx [facility]) Needs port-a-cath removed.  - Ambulatory referral to General Surgery - Comprehensive metabolic panel  7. Oral phase dysphagia  - DG OP Swallowing Func-Medicare/Speech Path; Future       Lelon Huh, MD  [facility]

## 2018-03-16 NOTE — Telephone Encounter (Signed)
Per Threasa Beards at [facility] pt's oncologist will need to make referral for port removal.Call back # [phone]

## 2018-03-17 ENCOUNTER — Other Ambulatory Visit: Payer: Self-pay | Admitting: Family Medicine

## 2018-03-17 DIAGNOSIS — I10 Essential (primary) hypertension: Secondary | ICD-10-CM

## 2018-03-17 LAB — COMPREHENSIVE METABOLIC PANEL
ALBUMIN: 4.1 g/dL (ref 3.5–4.7)
ALT: 7 IU/L (ref 0–32)
AST: 13 IU/L (ref 0–40)
Albumin/Globulin Ratio: 1.6 (ref 1.2–2.2)
Alkaline Phosphatase: 66 IU/L (ref 39–117)
BILIRUBIN TOTAL: 0.3 mg/dL (ref 0.0–1.2)
BUN / CREAT RATIO: 18 (ref 12–28)
BUN: 24 mg/dL (ref 8–27)
CALCIUM: 10.4 mg/dL — AB (ref 8.7–10.3)
CHLORIDE: 106 mmol/L (ref 96–106)
CO2: 24 mmol/L (ref 20–29)
CREATININE: 1.33 mg/dL — AB (ref 0.57–1.00)
GFR, EST AFRICAN AMERICAN: 43 mL/min/{1.73_m2} — AB (ref >59)
GFR, EST NON AFRICAN AMERICAN: 37 mL/min/{1.73_m2} — AB (ref >59)
GLUCOSE: 85 mg/dL (ref 65–99)
Globulin, Total: 2.5 g/dL (ref 1.5–4.5)
Potassium: 4.7 mmol/L (ref 3.5–5.2)
Sodium: 144 mmol/L (ref 134–144)
TOTAL PROTEIN: 6.6 g/dL (ref 6.0–8.5)

## 2018-03-17 LAB — CBC
HEMATOCRIT: 37.7 % (ref 34.0–46.6)
HEMOGLOBIN: 12.5 g/dL (ref 11.1–15.9)
MCH: 30.9 pg (ref 26.6–33.0)
MCHC: 33.2 g/dL (ref 31.5–35.7)
MCV: 93 fL (ref 79–97)
Platelets: 208 10*3/uL (ref 150–379)
RBC: 4.04 x10E6/uL (ref 3.77–5.28)
RDW: 14.2 % (ref 12.3–15.4)
WBC: 4.9 10*3/uL (ref 3.4–10.8)

## 2018-03-17 LAB — TSH: TSH: 3.57 u[IU]/mL (ref 0.450–4.500)

## 2018-03-17 LAB — VITAMIN D 25 HYDROXY (VIT D DEFICIENCY, FRACTURES): VIT D 25 HYDROXY: 56.2 ng/mL (ref 30.0–100.0)

## 2018-03-17 MED ORDER — LISINOPRIL 10 MG PO TABS: ORAL_TABLET | ORAL | 4 refills | Status: AC

## 2018-03-17 NOTE — Telephone Encounter (Signed)
Pharmacy requesting refills. Thanks!  

## 2018-03-17 NOTE — Telephone Encounter (Signed)
[place] faxed a refill request for a 90-days supply for the following medication. Thanks CC  lisinopril (PRINIVIL,ZESTRIL) 10 MG tablet

## 2018-03-21 ENCOUNTER — Telehealth: Payer: Self-pay | Admitting: Family Medicine

## 2018-03-21 NOTE — Telephone Encounter (Signed)
Returned call to pt's son. Advised medications were sent to pharmacy 03/17/2018. Confirmed by pharmacy. Also per Parke Poisson, she is still working on referral to Dr. Marlyn Corporal.

## 2018-03-21 NOTE — Telephone Encounter (Signed)
Son is following up his mom's appt from last week.  He said she was suppose to get a call back regarding labs, 2 referral appts. And prescriptions that were suppose to be called in to [facility] [address]  Please call son back at [phone]   Thanks, Con Memos

## 2018-03-21 NOTE — Telephone Encounter (Signed)
Per Raquel Sarna at Dr Dwyane Luo pt will need to get reestablished with oncologist and will need an order from them before port is removed

## 2018-03-23 DIAGNOSIS — H5213 Myopia, bilateral: Secondary | ICD-10-CM | POA: Diagnosis not present

## 2018-03-23 DIAGNOSIS — H26493 Other secondary cataract, bilateral: Secondary | ICD-10-CM | POA: Diagnosis not present

## 2018-03-23 DIAGNOSIS — Z961 Presence of intraocular lens: Secondary | ICD-10-CM | POA: Diagnosis not present

## 2018-03-23 NOTE — Telephone Encounter (Signed)
Pt will need referral to oncologist to get order for removal of port

## 2018-03-23 NOTE — Telephone Encounter (Signed)
Order entered for oncology referral as below.

## 2018-03-23 NOTE — Telephone Encounter (Signed)
Please advise 

## 2018-03-29 ENCOUNTER — Other Ambulatory Visit: Payer: Self-pay | Admitting: *Deleted

## 2018-03-29 DIAGNOSIS — Z95828 Presence of other vascular implants and grafts: Secondary | ICD-10-CM

## 2018-04-01 ENCOUNTER — Encounter: Payer: Self-pay | Admitting: General Surgery

## 2018-04-08 ENCOUNTER — Ambulatory Visit: Payer: Medicare Other | Attending: Family Medicine

## 2018-04-28 ENCOUNTER — Other Ambulatory Visit: Payer: Self-pay

## 2018-05-10 ENCOUNTER — Encounter: Payer: Self-pay | Admitting: General Surgery

## 2018-05-10 ENCOUNTER — Ambulatory Visit (INDEPENDENT_AMBULATORY_CARE_PROVIDER_SITE_OTHER): Payer: Medicare Other | Admitting: General Surgery

## 2018-05-10 VITALS — BP 120/62 | HR 74 | Resp 14 | Ht 65.0 in | Wt 130.0 lb

## 2018-05-10 DIAGNOSIS — C4442 Squamous cell carcinoma of skin of scalp and neck: Secondary | ICD-10-CM | POA: Diagnosis not present

## 2018-05-10 NOTE — Patient Instructions (Signed)
The patient is aware to call back for any questions or concerns.  

## 2018-05-10 NOTE — Progress Notes (Signed)
Patient ID: Emily [name], female   DOB: 1933-06-17, 84 y.o.   MRN: [id number]  Chief Complaint  Patient presents with  . Procedure    HPI Emily [name] is a 84 y.o. female.  Here today for port removal.  HPI  Past Medical History:  Diagnosis Date  . Arthritis    fingers  . Cancer ([place])    rectal  . Cancer ([place]) 2012   colon cancer  . Cancer of contiguous sites of hypopharynx ([place]) 12/23/2015  . Colon cancer ([place])   . Difficult intubation   . Diverticulosis   . Dyspnea    SOB  . Hemorrhoids   . History of chicken pox   . HOH (hard of hearing)   . Hypertension   . Osteopenia   . Status post chemotherapy    colon cancer  . Status post radiation therapy    colon cancer 2013    Past Surgical History:  Procedure Laterality Date  . ABDOMINAL HYSTERECTOMY    . ABDOMINAL HYSTERECTOMY  1980   partial  . APPENDECTOMY    . APPENDECTOMY  [id number]   Dr. Pat Patrick  . BREAST CYST ASPIRATION Bilateral    negative  . BREAST SURGERY     cyst removal  . BREAST SURGERY  1960   Breast Biopsy  . CARDIAC CATHETERIZATION N/A 06/23/2016   Procedure: Left Heart Cath and Coronary Angiography;  Surgeon: Corey Skains, MD;  Location: [place] CV LAB;  Service: Cardiovascular;  Laterality: N/A;  . CATARACT EXTRACTION W/PHACO Left 06/01/2017   Procedure: CATARACT EXTRACTION PHACO AND INTRAOCULAR LENS PLACEMENT (IOC);  Surgeon: Birder Robson, MD;  Location: [facility] ORS;  Service: Ophthalmology;  Laterality: Left;  [place] 00:55 AP% 19.4 CDE 10.71 Fluid pack lot [id number]  . CATARACT EXTRACTION W/PHACO Right 07/06/2017   Procedure: CATARACT EXTRACTION PHACO AND INTRAOCULAR LENS PLACEMENT (IOC);  Surgeon: Birder Robson, MD;  Location: [facility];  Service: Ophthalmology;  Laterality: Right;  [place]  00:44.7 AP% 18.2 CDE 8.10 Fluid Pack lot # [id number]  . COLON SURGERY  06/28/2013   resected tumor from colon; [facility]  . COLONOSCOPY    . COLONOSCOPY N/A 10/01/2015    Procedure: COLONOSCOPY;  Surgeon: [name], MD;  Location: [facility] ENDOSCOPY;  Service: Endoscopy;  Laterality: N/A;  . DILATION AND CURETTAGE OF UTERUS  1957  . DIRECT LARYNGOSCOPY N/A 12/11/2015   Procedure: DIRECT LARYNGOSCOPY;  Surgeon: Clyde Canterbury, MD;  Location: [facility];  Service: ENT;  Laterality: N/A;  . ESOPHAGOSCOPY N/A 12/11/2015   Procedure: ESOPHAGOSCOPY;  Surgeon: Clyde Canterbury, MD;  Location: [facility] OR[facility];  Service: ENT;  Laterality: N/A;  . EUS N/A 01/20/2013   Procedure: LOWER ENDOSCOPIC ULTRASOUND (EUS);  Surgeon: Milus Banister, MD;  Location: [facility];  Service: Endoscopy;  Laterality: N/A;  . JOINT REPLACEMENT    . PEG PLACEMENT N/A 01/01/2016   Procedure: PERCUTANEOUS ENDOSCOPIC GASTROSTOMY (PEG) PLACEMENT;  Surgeon: [name], MD;  Location: [facility] ORS;  Service: General;  Laterality: N/A;  . PORTACATH PLACEMENT Right 01/01/2016   Procedure: INSERTION PORT-A-CATH;  Surgeon: [name], MD;  Location: [facility];  Service: General;  Laterality: Right;  . TONSILLECTOMY    . TOTAL HIP ARTHROPLASTY Right 06/24/2016   Procedure: TOTAL HIP ARTHROPLASTY ANTERIOR APPROACH;  Surgeon: Hessie Knows, MD;  Location: [facility] ORS;  Service: Orthopedics;  Laterality: Right;    Family History  Problem Relation Age of Onset  . Kidney failure Father   .  Breast cancer Maternal Grandmother     Social History Social History   Tobacco Use  . Smoking status: Former Smoker    Packs/day: 1.00    Years: 64.00    Pack years: 64.00    Types: Cigarettes  . Smokeless tobacco: Never Used  Substance Use Topics  . Alcohol use: Yes    Alcohol/week: 0.0 oz    Comment: rarely  . Drug use: No    Allergies  Allergen Reactions  . Amlodipine Besylate Other (See Comments)    Patient unaware of any allergy with this medicine  . Oxycodone     confusion    Current Outpatient Medications  Medication Sig Dispense Refill  . Cholecalciferol (VITAMIN D-3) 1000 units CAPS Take 1,000  Units by mouth daily.     . cyanocobalamin 1000 MCG tablet Take 1,000 mcg by mouth daily.    Marland Kitchen donepezil (ARICEPT) 5 MG tablet Take 1 tablet (5 mg total) by mouth at bedtime. 30 tablet 4  . lisinopril (PRINIVIL,ZESTRIL) 10 MG tablet One tablet daily 90 tablet 4  . metoprolol succinate (TOPROL-XL) 25 MG 24 hr tablet TAKE 1/2 (ONE-HALF) TABLET BY MOUTH ONCE DAILY SCHEDULE  OFFICE  VISIT  FOR  FOLLOW  UP 15 tablet 2   No current facility-administered medications for this visit.    Facility-Administered Medications Ordered in Other Visits  Medication Dose Route Frequency Provider Last Rate Last Dose  . heparin lock flush 100 unit/mL  500 Units Intravenous Once Charlaine Dalton R, MD      . sodium chloride flush (NS) 0.9 % injection 10 mL  10 mL Intravenous PRN Cammie Sickle, MD        Review of Systems Review of Systems  Constitutional: Negative.   Respiratory: Negative.   Cardiovascular: Negative.     Blood pressure 120/62, pulse 74, resp. rate 14, height 5\' 5"  (1.651 m), weight 130 lb (59 kg).  The patient's weight is up 4 pounds from her last visit November 2017.  Physical Exam Physical Exam  Constitutional: She is oriented to person, place, and time. She appears well-developed and well-nourished.  Neurological: She is alert and oriented to person, place, and time.  Skin: Skin is warm and dry.  Port site is clean without evidence of infection.  The port is sitting about 3 cm below the incision for insertion.  Data Reviewed The procedure was reviewed and the patient amenable to proceed.  10 cc of 0.5% Xylocaine with 0.25% Marcaine with 1-200,000 notes of epinephrine was utilized and well-tolerated.  ChloraPrep was applied to the skin.  A transverse incision overlying the port was made carried down the skin and subtendinous tissue.  Scant bleeding was noted.  Transfixion sutures of Prolene were removed.  The port was extracted without incident including an intact catheter  tip.  The pocket was obliterated with a running 3 oh 3-0 Vicryl suture.  The skin was then closed with a running 3-0 Vicryl subarticular suture.  Benzoin, Steri-Strip followed by Telfa and Tegaderm dressing applied.  Ice pack applied.  Post procedure wound care reviewed.  Assessment    Good tolerance of PowerPort removal.    Plan        Return as needed.    HPI, Physical Exam, Assessment and Plan have been scribed under the direction and in the presence of [name], MD. Karie Fetch, RN  I have completed the exam and reviewed the above documentation for accuracy and completeness.  I agree with the  above.  Haematologist has been used and any errors in dictation or transcription are unintentional.  Hervey Ard, M.D., F.A.C.S.  [name] Tony Granquist 05/10/2018, 8:39 PM

## 2018-05-11 ENCOUNTER — Telehealth: Payer: Self-pay | Admitting: General Surgery

## 2018-05-11 NOTE — Telephone Encounter (Signed)
A message was left for Emily [name] to call & schedule an appointment with the nurse in 1 wk from 05-10-18 to be checked after port removal.

## 2018-05-16 ENCOUNTER — Other Ambulatory Visit: Payer: Self-pay | Admitting: Family Medicine

## 2018-05-16 ENCOUNTER — Ambulatory Visit (INDEPENDENT_AMBULATORY_CARE_PROVIDER_SITE_OTHER): Payer: Medicare Other | Admitting: *Deleted

## 2018-05-16 DIAGNOSIS — C4442 Squamous cell carcinoma of skin of scalp and neck: Secondary | ICD-10-CM

## 2018-05-16 NOTE — Progress Notes (Signed)
Patient came in today for a wound check.  The wound is clean, with no signs of infection noted. .Follow up as scheduled.  

## 2018-05-16 NOTE — Patient Instructions (Signed)
Return as needed

## 2018-06-17 ENCOUNTER — Ambulatory Visit: Payer: Self-pay | Admitting: Family Medicine

## 2018-09-06 ENCOUNTER — Telehealth: Payer: Self-pay | Admitting: Family Medicine

## 2018-09-06 NOTE — Telephone Encounter (Signed)
I left a message asking the pt to call me at [phone] to schedule AWV-I w/ [facility]. VDM (DD) °

## 2018-10-06 ENCOUNTER — Ambulatory Visit: Payer: Medicare Other | Admitting: General Surgery

## 2018-11-08 ENCOUNTER — Ambulatory Visit: Payer: Medicare Other | Admitting: General Surgery

## 2018-11-15 ENCOUNTER — Telehealth: Payer: Self-pay | Admitting: General Surgery

## 2018-11-15 NOTE — Telephone Encounter (Signed)
Spoke with the patient about her appointment with dr byrnett on 12/06/18 patient said she would wait for dr byrnett to return.

## 2018-12-06 ENCOUNTER — Other Ambulatory Visit: Payer: Self-pay

## 2018-12-06 ENCOUNTER — Ambulatory Visit: Payer: Medicare Other | Admitting: General Surgery

## 2018-12-06 ENCOUNTER — Encounter: Payer: Self-pay | Admitting: General Surgery

## 2018-12-06 ENCOUNTER — Ambulatory Visit (INDEPENDENT_AMBULATORY_CARE_PROVIDER_SITE_OTHER): Payer: Medicare Other | Admitting: General Surgery

## 2018-12-06 VITALS — BP 146/87 | HR 98 | Temp 97.7°F | Ht 65.0 in | Wt 125.0 lb

## 2018-12-06 DIAGNOSIS — Z85038 Personal history of other malignant neoplasm of large intestine: Secondary | ICD-10-CM | POA: Diagnosis not present

## 2018-12-06 MED ORDER — POLYETHYLENE GLYCOL 3350 17 GM/SCOOP PO POWD: ORAL | 0 refills | Status: DC

## 2018-12-06 NOTE — Progress Notes (Signed)
Patient ID: Emily [name], female   DOB: 03/14/1933, 85 y.o.   MRN: [id number]  Chief Complaint  Patient presents with  . Colonoscopy    HPI Emily [name] is a 85 y.o. female here today for a devaluation of a colonoscopy. Last colonoscopy was 10/01/2015. Patient states she is doing well. Moves her bowels daily.  HPI  Past Medical History:  Diagnosis Date  . Arthritis    fingers  . Cancer ([place])    rectal  . Cancer ([place]) 2012   colon cancer  . Cancer of contiguous sites of hypopharynx ([place]) 12/23/2015  . Colon cancer ([place])   . Difficult intubation   . Diverticulosis   . Dyspnea    SOB  . Hemorrhoids   . History of chicken pox   . HOH (hard of hearing)   . Hypertension   . Osteopenia   . Status post chemotherapy    colon cancer  . Status post radiation therapy    colon cancer 2013    Past Surgical History:  Procedure Laterality Date  . ABDOMINAL HYSTERECTOMY    . ABDOMINAL HYSTERECTOMY  1980   partial  . APPENDECTOMY    . APPENDECTOMY  [id number]   Dr. Pat Patrick  . BREAST CYST ASPIRATION Bilateral    negative  . BREAST SURGERY     cyst removal  . BREAST SURGERY  1960   Breast Biopsy  . CARDIAC CATHETERIZATION N/A 06/23/2016   Procedure: Left Heart Cath and Coronary Angiography;  Surgeon: Corey Skains, MD;  Location: [place] CV LAB;  Service: Cardiovascular;  Laterality: N/A;  . CATARACT EXTRACTION W/PHACO Left 06/01/2017   Procedure: CATARACT EXTRACTION PHACO AND INTRAOCULAR LENS PLACEMENT (IOC);  Surgeon: Birder Robson, MD;  Location: [facility] ORS;  Service: Ophthalmology;  Laterality: Left;  [place] 00:55 AP% 19.4 CDE 10.71 Fluid pack lot [id number]  . CATARACT EXTRACTION W/PHACO Right 07/06/2017   Procedure: CATARACT EXTRACTION PHACO AND INTRAOCULAR LENS PLACEMENT (IOC);  Surgeon: Birder Robson, MD;  Location: [facility] ORS;  Service: Ophthalmology;  Laterality: Right;  [place]  00:44.7 AP% 18.2 CDE 8.10 Fluid Pack lot # [id number]  . COLON SURGERY  06/28/2013    resected tumor from colon; [facility]  . COLONOSCOPY    . COLONOSCOPY N/A 10/01/2015   Procedure: COLONOSCOPY;  Surgeon: [name], MD;  Location: [facility] ENDOSCOPY;  Service: Endoscopy;  Laterality: N/A;  . DILATION AND CURETTAGE OF UTERUS  1957  . DIRECT LARYNGOSCOPY N/A 12/11/2015   Procedure: DIRECT LARYNGOSCOPY;  Surgeon: Clyde Canterbury, MD;  Location: [facility] ORS;  Service: ENT;  Laterality: N/A;  . ESOPHAGOSCOPY N/A 12/11/2015   Procedure: ESOPHAGOSCOPY;  Surgeon: Clyde Canterbury, MD;  Location: [facility] OR[facility];  Service: ENT;  Laterality: N/A;  . EUS N/A 01/20/2013   Procedure: LOWER ENDOSCOPIC ULTRASOUND (EUS);  Surgeon: Milus Banister, MD;  Location: [facility];  Service: Endoscopy;  Laterality: N/A;  . JOINT REPLACEMENT    . PEG PLACEMENT N/A 01/01/2016   Procedure: PERCUTANEOUS ENDOSCOPIC GASTROSTOMY (PEG) PLACEMENT;  Surgeon: [name], MD;  Location: [facility] ORS;  Service: General;  Laterality: N/A;  . PORTACATH PLACEMENT Right 01/01/2016   Procedure: INSERTION PORT-A-CATH;  Surgeon: [name], MD;  Location: [facility] ORS;  Service: General;  Laterality: Right;  . TONSILLECTOMY    . TOTAL HIP ARTHROPLASTY Right 06/24/2016   Procedure: TOTAL HIP ARTHROPLASTY ANTERIOR APPROACH;  Surgeon: Hessie Knows, MD;  Location: [facility] ORS;  Service: Orthopedics;  Laterality: Right;  Family History  Problem Relation Age of Onset  . Kidney failure Father   . Breast cancer Maternal Grandmother     Social History Social History   Tobacco Use  . Smoking status: Former Smoker    Packs/day: 1.00    Years: 64.00    Pack years: 64.00    Types: Cigarettes  . Smokeless tobacco: Never Used  Substance Use Topics  . Alcohol use: Yes    Alcohol/week: 0.0 standard drinks    Comment: rarely  . Drug use: No    Allergies  Allergen Reactions  . Amlodipine Besylate Other (See Comments)    Patient unaware of any allergy with this medicine  . Oxycodone     confusion     Current Outpatient Medications  Medication Sig Dispense Refill  . Cholecalciferol (VITAMIN D-3) 1000 units CAPS Take 1,000 Units by mouth daily.     . cyanocobalamin 1000 MCG tablet Take 1,000 mcg by mouth daily.    Marland Kitchen lisinopril (PRINIVIL,ZESTRIL) 10 MG tablet One tablet daily 90 tablet 4  . metoprolol succinate (TOPROL-XL) 25 MG 24 hr tablet Take 0.5 tablets (12.5 mg total) by mouth daily. 45 tablet 3  . donepezil (ARICEPT) 5 MG tablet Take 1 tablet (5 mg total) by mouth at bedtime. (Patient not taking: Reported on 12/06/2018) 30 tablet 4  . polyethylene glycol powder (GLYCOLAX/MIRALAX) powder Mix whole container with 64 ounces of clear liquids, No Red liquids. 255 g 0   No current facility-administered medications for this visit.    Facility-Administered Medications Ordered in Other Visits  Medication Dose Route Frequency Provider Last Rate Last Dose  . heparin lock flush 100 unit/mL  500 Units Intravenous Once Charlaine Dalton R, MD      . sodium chloride flush (NS) 0.9 % injection 10 mL  10 mL Intravenous PRN Cammie Sickle, MD        Review of Systems Review of Systems  Constitutional: Negative.   Respiratory: Negative.   Cardiovascular: Negative.     Blood pressure (!) 146/87, pulse 98, temperature 97.7 F (36.5 C), temperature source Skin, height 5\' 5"  (1.651 m), weight 125 lb (56.7 kg), SpO2 93 %. The patient's weight is down 5 pounds from her July 2019 exam, 6 pounds up from her November 2017 exam when she was recovering from oral cancer..     Physical Exam Physical Exam Eyes:     General: No scleral icterus.    Pupils: Pupils are equal, round, and reactive to light.  Neck:     Musculoskeletal: Neck supple.  Cardiovascular:     Rate and Rhythm: Normal rate and regular rhythm.  Pulmonary:     Effort: Pulmonary effort is normal.     Breath sounds: Normal breath sounds.  Skin:    General: Skin is warm and dry.  Neurological:     Mental Status: She is  alert and oriented to person, place, and time.     Data Reviewed October 01, 2015 colonoscopy results: A. COLON POLYP, CECUM; COLD BIOPSY:  - FRAGMENTS OF TUBULAR ADENOMA.  - NEGATIVE FOR HIGH-GRADE DYSPLASIA AND MALIGNANCY.  June 28, 2013 low anterior resection with diverting ileostomy completed at [facility] showed a ypT3, N0 low-grade adenocarcinoma.  Ileostomy takedown of September 27, 2013 was unremarkable.  Comprehensive anabolic panel and CBC of Mar 16, 2018 showed a creatinine of 1.3 with an estimated GFR 37.  Normal electrolytes.  Minimally elevated calcium of 10.4.  CBC showed a hemoglobin of 12.5 with an  MCV of 93, white blood cell count of 4900.  Platelet count of 208,000.  Assessment    Status post rectal cancer 2014, tubular adenoma of the cecum 2016.  Candidate for follow-up colonoscopy.    Plan  While the patient is experiencing some mild short-term memory issues in general she is doing well, living independently and appears to be in good enough health for surveillance exam. The lack of firm data for patients in her age bracket were reviewed both of patient and her son who was present for the interview and exam.  Colonoscopy with possible biopsy/polypectomy prn: Information regarding the procedure, including its potential risks and complications (including but not limited to perforation of the bowel, which may require emergency surgery to repair, and bleeding) was verbally given to the patient. Educational information regarding lower intestinal endoscopy was given to the patient. Written instructions for how to complete the bowel prep using Miralax were provided. The importance of drinking ample fluids to avoid dehydration as a result of the prep emphasized.  HPI, Physical Exam, Assessment and Plan have been scribed under the direction and in the presence of Hervey Ard, MD.  Gaspar Cola, CMA  I have completed the exam and reviewed the above  documentation for accuracy and completeness.  I agree with the above.  Haematologist has been used and any errors in dictation or transcription are unintentional.  Hervey Ard, M.D., F.A.C.S.  Emily [name] 12/07/2018, 2:44 PM

## 2018-12-06 NOTE — Patient Instructions (Addendum)
The patient is scheduled for a Colonoscopy at [facility] on 01/11/19. They are aware to call the day before to get their arrival time. Miralax prescription has been sent into the patient's pharmacy. She will only take her Metoprolol at 6 am with water the morning of. The patient is aware of date and instructions.    Colonoscopy, Adult A colonoscopy is an exam to look at the entire large intestine. During the exam, a lubricated, flexible tube that has a camera on the end of it is inserted into the anus and then passed into the rectum, colon, and other parts of the large intestine. You may have a colonoscopy as a part of normal colorectal screening or if you have certain symptoms, such as:  Lack of red blood cells (anemia).  Diarrhea that does not go away.  Abdominal pain.  Blood in your stool (feces). A colonoscopy can help screen for and diagnose medical problems, including:  Tumors.  Polyps.  Inflammation.  Areas of bleeding. Tell a health care provider about:  Any allergies you have.  All medicines you are taking, including vitamins, herbs, eye drops, creams, and over-the-counter medicines.  Any problems you or family members have had with anesthetic medicines.  Any blood disorders you have.  Any surgeries you have had.  Any medical conditions you have.  Any problems you have had passing stool. What are the risks? Generally, this is a safe procedure. However, problems may occur, including:  Bleeding.  A tear in the intestine.  A reaction to medicines given during the exam.  Infection (rare). What happens before the procedure? Eating and drinking restrictions Follow instructions from your health care provider about eating and drinking, which may include:  A few days before the procedure - follow a low-fiber diet. Avoid nuts, seeds, dried fruit, raw fruits, and vegetables.  1-3 days before the procedure - follow a clear liquid diet. Drink only clear liquids, such as  clear broth or bouillon, black coffee or tea, clear juice, clear soft drinks or sports drinks, gelatin dessert, and popsicles. Avoid any liquids that contain red or purple dye.  On the day of the procedure - do not eat or drink anything starting 2 hours before the procedure, or within the time period that your health care provider recommends. Up to 2 hours before the procedure, you may continue to drink clear liquids, such as water or clear fruit juice. Bowel prep If you were prescribed an oral bowel prep to clean out your colon:  Take it as told by your health care provider. Starting the day before your procedure, you will need to drink a large amount of medicated liquid. The liquid will cause you to have multiple loose stools until your stool is almost clear or light green.  If your skin or anus gets irritated from diarrhea, you may use these to relieve the irritation: ? Medicated wipes, such as adult wet wipes with aloe and vitamin E. ? A skin-soothing product like petroleum jelly.  If you vomit while drinking the bowel prep, take a break for up to 60 minutes and then begin the bowel prep again. If vomiting continues and you cannot take the bowel prep without vomiting, call your health care provider.  To clean out your colon, you may also be given: ? Laxative medicines. ? Instructions about how to use an enema. General instructions  Ask your health care provider about: ? Changing or stopping your regular medicines or supplements. This is especially important if you  are taking iron supplements, diabetes medicines, or blood thinners. ? Taking medicines such as aspirin and ibuprofen. These medicines can thin your blood. Do not take these medicines before the procedure if your health care provider tells you not to.  Plan to have someone take you home from the hospital or clinic. What happens during the procedure?   An IV may be inserted into one of your veins.  You will be given medicine  to help you relax (sedative).  To reduce your risk of infection: ? Your health care team will wash or sanitize their hands. ? Your anal area will be washed with soap.  You will be asked to lie on your side with your knees bent.  Your health care provider will lubricate a long, thin, flexible tube. The tube will have a camera and a light on the end.  The tube will be inserted into your anus.  The tube will be gently eased through your rectum and colon.  Air will be delivered into your colon to keep it open. You may feel some pressure or cramping.  The camera will be used to take images during the procedure.  A small tissue sample may be removed to be examined under a microscope (biopsy).  If small polyps are found, your health care provider may remove them and have them checked for cancer cells.  When the exam is done, the tube will be removed. The procedure may vary among health care providers and hospitals. What happens after the procedure?  Your blood pressure, heart rate, breathing rate, and blood oxygen level will be monitored until the medicines you were given have worn off.  Do not drive for 24 hours after the exam.  You may have a small amount of blood in your stool.  You may pass gas and have mild abdominal cramping or bloating due to the air that was used to inflate your colon during the exam.  It is up to you to get the results of your procedure. Ask your health care provider, or the department performing the procedure, when your results will be ready. Summary  A colonoscopy is an exam to look at the entire large intestine.  During a colonoscopy, a lubricated, flexible tube with a camera on the end of it is inserted into the anus and then passed into the colon and other parts of the large intestine.  Follow instructions from your health care provider about eating and drinking before the procedure.  If you were prescribed an oral bowel prep to clean out your colon,  take it as told by your health care provider.  After your procedure, your blood pressure, heart rate, breathing rate, and blood oxygen level will be monitored until the medicines you were given have worn off. This information is not intended to replace advice given to you by your health care provider. Make sure you discuss any questions you have with your health care provider. Document Released: 10/16/2000 Document Revised: 08/11/2017 Document Reviewed: 12/31/2015 Elsevier Interactive Patient Education  2019 [employer].

## 2019-01-10 ENCOUNTER — Telehealth: Payer: Self-pay | Admitting: General Surgery

## 2019-01-10 NOTE — Telephone Encounter (Signed)
Spoke with the patient's son and have rescheduled her colonoscopy to 02/15/19. She will call the day before to get her arrival time and will only take her Metoprolol the morning of at 6 am. We will mail out a new instructions packet.

## 2019-01-10 NOTE — Telephone Encounter (Signed)
[name] has called. Patients is canceling her colonoscopy that is scheduled on DOS:01/11/2019. States other family issues at this time. He would like to be called to have this rescheduled for his mother.   I have called [facility] and spoke with Magda Paganini to make them aware of cancellation.    Please call son at [phone] to reschedule.

## 2019-01-11 ENCOUNTER — Ambulatory Visit: Admission: RE | Admit: 2019-01-11 | Payer: Medicare Other | Source: Home / Self Care | Admitting: General Surgery

## 2019-01-11 ENCOUNTER — Encounter: Admission: RE | Payer: Self-pay | Source: Home / Self Care

## 2019-01-11 SURGERY — COLONOSCOPY WITH PROPOFOL
Anesthesia: General

## 2019-01-25 ENCOUNTER — Telehealth: Payer: Self-pay | Admitting: *Deleted

## 2019-01-25 NOTE — Telephone Encounter (Signed)
I did call and speak with patient's son, Emily [name] today.   [name] notified that due to recent COVID-19 restrictions, elective surgeries will need to be postponed. At this time, we were told that we could move cases to the week of 02-20-19.   Son wishes to just cancel for now until restrictions are lifted.   The patient will be contacted once restrictions have been lifted regarding rescheduling.

## 2019-02-15 ENCOUNTER — Ambulatory Visit: Admit: 2019-02-15 | Payer: Medicare Other | Admitting: General Surgery

## 2019-02-15 SURGERY — COLONOSCOPY WITH PROPOFOL
Anesthesia: General

## 2019-03-14 ENCOUNTER — Telehealth: Payer: Self-pay | Admitting: *Deleted

## 2019-03-14 NOTE — Telephone Encounter (Signed)
I called and spoke with the patient's son, [name] today.   I did let him know that we could get patient's colonoscopy rescheduled.  Son states that patient is at a retirement community and believes they are on lock down due to COVID-19. He would like a call back in 2 weeks regarding colonoscopy scheduling.

## 2019-03-29 ENCOUNTER — Telehealth: Payer: Self-pay | Admitting: *Deleted

## 2019-03-29 ENCOUNTER — Other Ambulatory Visit: Payer: Self-pay

## 2019-03-29 ENCOUNTER — Emergency Department
Admission: EM | Admit: 2019-03-29 | Discharge: 2019-03-29 | Disposition: A | Discharge status: Home / Self Care | Payer: Medicare Other | Source: Home / Self Care

## 2019-03-29 DIAGNOSIS — Z923 Personal history of irradiation: Secondary | ICD-10-CM | POA: Diagnosis not present

## 2019-03-29 DIAGNOSIS — Z20828 Contact with and (suspected) exposure to other viral communicable diseases: Secondary | ICD-10-CM | POA: Diagnosis not present

## 2019-03-29 DIAGNOSIS — F0390 Unspecified dementia without behavioral disturbance: Secondary | ICD-10-CM | POA: Diagnosis not present

## 2019-03-29 DIAGNOSIS — N179 Acute kidney failure, unspecified: Secondary | ICD-10-CM | POA: Diagnosis not present

## 2019-03-29 DIAGNOSIS — Z5321 Procedure and treatment not carried out due to patient leaving prior to being seen by health care provider: Secondary | ICD-10-CM | POA: Insufficient documentation

## 2019-03-29 DIAGNOSIS — M25551 Pain in right hip: Secondary | ICD-10-CM | POA: Diagnosis not present

## 2019-03-29 DIAGNOSIS — Z90710 Acquired absence of both cervix and uterus: Secondary | ICD-10-CM | POA: Diagnosis not present

## 2019-03-29 DIAGNOSIS — N390 Urinary tract infection, site not specified: Secondary | ICD-10-CM | POA: Diagnosis not present

## 2019-03-29 DIAGNOSIS — R531 Weakness: Secondary | ICD-10-CM | POA: Insufficient documentation

## 2019-03-29 DIAGNOSIS — N3000 Acute cystitis without hematuria: Secondary | ICD-10-CM | POA: Diagnosis not present

## 2019-03-29 DIAGNOSIS — Z1159 Encounter for screening for other viral diseases: Secondary | ICD-10-CM | POA: Diagnosis not present

## 2019-03-29 DIAGNOSIS — S79911A Unspecified injury of right hip, initial encounter: Secondary | ICD-10-CM | POA: Diagnosis not present

## 2019-03-29 LAB — BASIC METABOLIC PANEL
Anion gap: 11 (ref 5–15)
BUN: 22 mg/dL (ref 8–23)
CO2: 26 mmol/L (ref 22–32)
Calcium: 10.4 mg/dL — ABNORMAL HIGH (ref 8.9–10.3)
Chloride: 102 mmol/L (ref 98–111)
Creatinine, Ser: 1.21 mg/dL — ABNORMAL HIGH (ref 0.44–1.00)
GFR calc Af Amer: 48 mL/min — ABNORMAL LOW (ref >60)
GFR calc non Af Amer: 41 mL/min — ABNORMAL LOW (ref >60)
Glucose, Bld: 124 mg/dL — ABNORMAL HIGH (ref 70–99)
Potassium: 3.8 mmol/L (ref 3.5–5.1)
Sodium: 139 mmol/L (ref 135–145)

## 2019-03-29 LAB — URINALYSIS, COMPLETE (UACMP) WITH MICROSCOPIC
Bacteria, UA: NONE SEEN
Bilirubin Urine: NEGATIVE
Glucose, UA: NEGATIVE mg/dL
Ketones, ur: 5 mg/dL — AB
Nitrite: NEGATIVE
Protein, ur: 100 mg/dL — AB
RBC / HPF: >50 RBC/hpf — ABNORMAL HIGH (ref 0–5)
Specific Gravity, Urine: 1.019 (ref 1.005–1.030)
WBC, UA: >50 WBC/hpf — ABNORMAL HIGH (ref 0–5)
pH: 7 (ref 5.0–8.0)

## 2019-03-29 LAB — CBC
HCT: 43.3 % (ref 36.0–46.0)
Hemoglobin: 14.3 g/dL (ref 12.0–15.0)
MCH: 29.4 pg (ref 26.0–34.0)
MCHC: 33 g/dL (ref 30.0–36.0)
MCV: 89.1 fL (ref 80.0–100.0)
Platelets: 251 10*3/uL (ref 150–400)
RBC: 4.86 MIL/uL (ref 3.87–5.11)
RDW: 13.4 % (ref 11.5–15.5)
WBC: 5.5 10*3/uL (ref 4.0–10.5)
nRBC: 0 % (ref 0.0–0.2)

## 2019-03-29 MED ORDER — SODIUM CHLORIDE 0.9% FLUSH: 3.0000 mL | Freq: Once | INTRAVENOUS | Status: DC

## 2019-03-29 NOTE — ED Notes (Signed)
Pts son seen taking patient out of lobby without announcement.

## 2019-03-29 NOTE — Progress Notes (Signed)
Spoke with Mr. Buchholz (son) on phone. He was aware of the visitation policy and not happy with it. He told me that since "we are refusing to treat" his mother that we needed to roll her to the front door.  I assured him that we are not refusing to treat his mother. He insisted that she be taken out to him. I stated that she would be "leaving against medical advice" and he said "what advice, you have done nothing."   Called Heather, CN in ED, to have her take patient out to her son that is her "POA, Power over her trust, medical power of attorney." The patient has dementia.

## 2019-03-29 NOTE — ED Triage Notes (Signed)
Pt here with daughter n law, states pt was weak and fell last night, pt has a hx of dementia and is at baseline on arrival. Denies any pain. Pt lives at [facility] and refused EMS transport last night,

## 2019-03-29 NOTE — ED Notes (Addendum)
Pts family member at ER entrance requesting to speak to charge RN, pts son was disgruntled and wanted to express his thoughts on the visitor policy. Son stated that it was discrimination to allow laboring mothers to have one vistor during their stay and not to allow one family member in the ED. Explained to son that the policy was set in place by [facility] to keep community and patients save and healthy.

## 2019-03-29 NOTE — Telephone Encounter (Signed)
Message left for patient to call the office.   We need to see if patient would like to get colonoscopy rescheduled.   COVID testing would need to be done same day if she lives in a retirement community. Patient would need to have testing done at 8 am the morning of procedure at the [facility] drive thru and wait in the car or go home until test results are available.

## 2019-03-30 ENCOUNTER — Other Ambulatory Visit: Payer: Self-pay | Admitting: Family Medicine

## 2019-03-30 ENCOUNTER — Telehealth: Payer: Self-pay

## 2019-03-30 DIAGNOSIS — R8281 Pyuria: Secondary | ICD-10-CM

## 2019-03-30 MED ORDER — SULFAMETHOXAZOLE-TRIMETHOPRIM 200-40 MG/5ML PO SUSP: 20.0000 mL | Freq: Two times a day (BID) | ORAL | 0 refills | Status: DC

## 2019-03-30 NOTE — Telephone Encounter (Signed)
Patient calling that he spoke with someone from th office this morning and told them that they took his mother to the ED yesterday and that they left but wants to know about her labs results and urine because she had a UTI. States that this is a medical negligence and that we are refusing to treat and that he already spoke to a nurse today and we we have not got back to him and expect a phone call in the next 30 minutes otherwise we will be contacting his attorney tomorrow abut this. He also reports that his mother has lung cancer and is not able to take pills. Patient was advised that I do not see any other calls about this but that I would send this message to a provider in the office.  CB# [phone]

## 2019-03-30 NOTE — Telephone Encounter (Signed)
Returned a call to this patient's son Anatasia Tino) who was upset with the 3 hour wait his mother had to endure in the ER yesterday. He states she fell 03-28-19 at her retirement home residence and refused to go to the ER by ambulance. He took her to the ER due to right hip pain and urinary symptoms with confusion. States she had not been taking her medications the past 2 weeks. Lab reports from the ER after he left with his mother without announcing they er leaving, showed pyuria and hematuria on dipstick. He is concerned her confusion/dementia is worse due to the UTI. 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 Also, advised evaluation of her hip probably would require an x-ray to be sure no fracture or damage to the hip replacement hardware has not occurred.

## 2019-03-31 ENCOUNTER — Encounter: Payer: Self-pay | Admitting: Emergency Medicine

## 2019-03-31 ENCOUNTER — Inpatient Hospital Stay
Admission: EM | Admit: 2019-03-31 | Discharge: 2019-04-04 | DRG: 690 | Disposition: A | Discharge status: Skilled Nursing Facility | Payer: Medicare Other | Attending: Internal Medicine | Admitting: Internal Medicine

## 2019-03-31 ENCOUNTER — Emergency Department: Payer: Medicare Other

## 2019-03-31 ENCOUNTER — Other Ambulatory Visit: Payer: Self-pay

## 2019-03-31 DIAGNOSIS — Z85828 Personal history of other malignant neoplasm of skin: Secondary | ICD-10-CM | POA: Diagnosis not present

## 2019-03-31 DIAGNOSIS — Z96641 Presence of right artificial hip joint: Secondary | ICD-10-CM | POA: Diagnosis present

## 2019-03-31 DIAGNOSIS — F0390 Unspecified dementia without behavioral disturbance: Secondary | ICD-10-CM | POA: Diagnosis present

## 2019-03-31 DIAGNOSIS — I10 Essential (primary) hypertension: Secondary | ICD-10-CM | POA: Diagnosis present

## 2019-03-31 DIAGNOSIS — Z9221 Personal history of antineoplastic chemotherapy: Secondary | ICD-10-CM | POA: Diagnosis not present

## 2019-03-31 DIAGNOSIS — N390 Urinary tract infection, site not specified: Secondary | ICD-10-CM | POA: Diagnosis not present

## 2019-03-31 DIAGNOSIS — Z20828 Contact with and (suspected) exposure to other viral communicable diseases: Secondary | ICD-10-CM | POA: Diagnosis not present

## 2019-03-31 DIAGNOSIS — Z85038 Personal history of other malignant neoplasm of large intestine: Secondary | ICD-10-CM | POA: Diagnosis not present

## 2019-03-31 DIAGNOSIS — E559 Vitamin D deficiency, unspecified: Secondary | ICD-10-CM | POA: Diagnosis not present

## 2019-03-31 DIAGNOSIS — N179 Acute kidney failure, unspecified: Secondary | ICD-10-CM | POA: Diagnosis present

## 2019-03-31 DIAGNOSIS — R0902 Hypoxemia: Secondary | ICD-10-CM | POA: Diagnosis not present

## 2019-03-31 DIAGNOSIS — Z1159 Encounter for screening for other viral diseases: Secondary | ICD-10-CM | POA: Diagnosis not present

## 2019-03-31 DIAGNOSIS — R300 Dysuria: Secondary | ICD-10-CM | POA: Diagnosis not present

## 2019-03-31 DIAGNOSIS — I252 Old myocardial infarction: Secondary | ICD-10-CM | POA: Diagnosis not present

## 2019-03-31 DIAGNOSIS — R531 Weakness: Secondary | ICD-10-CM | POA: Diagnosis not present

## 2019-03-31 DIAGNOSIS — Z923 Personal history of irradiation: Secondary | ICD-10-CM

## 2019-03-31 DIAGNOSIS — I5181 Takotsubo syndrome: Secondary | ICD-10-CM | POA: Diagnosis not present

## 2019-03-31 DIAGNOSIS — Z87891 Personal history of nicotine dependence: Secondary | ICD-10-CM | POA: Diagnosis not present

## 2019-03-31 DIAGNOSIS — E440 Moderate protein-calorie malnutrition: Secondary | ICD-10-CM | POA: Diagnosis not present

## 2019-03-31 DIAGNOSIS — N189 Chronic kidney disease, unspecified: Secondary | ICD-10-CM | POA: Diagnosis not present

## 2019-03-31 DIAGNOSIS — Z87440 Personal history of urinary (tract) infections: Secondary | ICD-10-CM | POA: Diagnosis not present

## 2019-03-31 DIAGNOSIS — I129 Hypertensive chronic kidney disease with stage 1 through stage 4 chronic kidney disease, or unspecified chronic kidney disease: Secondary | ICD-10-CM | POA: Diagnosis present

## 2019-03-31 DIAGNOSIS — N183 Chronic kidney disease, stage 3 (moderate): Secondary | ICD-10-CM | POA: Diagnosis present

## 2019-03-31 DIAGNOSIS — R1310 Dysphagia, unspecified: Secondary | ICD-10-CM

## 2019-03-31 DIAGNOSIS — R069 Unspecified abnormalities of breathing: Secondary | ICD-10-CM | POA: Diagnosis not present

## 2019-03-31 DIAGNOSIS — R627 Adult failure to thrive: Secondary | ICD-10-CM | POA: Diagnosis present

## 2019-03-31 DIAGNOSIS — K5900 Constipation, unspecified: Secondary | ICD-10-CM | POA: Diagnosis present

## 2019-03-31 DIAGNOSIS — K219 Gastro-esophageal reflux disease without esophagitis: Secondary | ICD-10-CM | POA: Diagnosis present

## 2019-03-31 DIAGNOSIS — Z90710 Acquired absence of both cervix and uterus: Secondary | ICD-10-CM | POA: Diagnosis not present

## 2019-03-31 DIAGNOSIS — Z7189 Other specified counseling: Secondary | ICD-10-CM | POA: Diagnosis not present

## 2019-03-31 DIAGNOSIS — M2550 Pain in unspecified joint: Secondary | ICD-10-CM | POA: Diagnosis not present

## 2019-03-31 DIAGNOSIS — Z7401 Bed confinement status: Secondary | ICD-10-CM | POA: Diagnosis not present

## 2019-03-31 DIAGNOSIS — G4700 Insomnia, unspecified: Secondary | ICD-10-CM | POA: Diagnosis not present

## 2019-03-31 DIAGNOSIS — Z85048 Personal history of other malignant neoplasm of rectum, rectosigmoid junction, and anus: Secondary | ICD-10-CM

## 2019-03-31 DIAGNOSIS — M25551 Pain in right hip: Secondary | ICD-10-CM | POA: Diagnosis not present

## 2019-03-31 DIAGNOSIS — M1990 Unspecified osteoarthritis, unspecified site: Secondary | ICD-10-CM | POA: Diagnosis not present

## 2019-03-31 DIAGNOSIS — Z85818 Personal history of malignant neoplasm of other sites of lip, oral cavity, and pharynx: Secondary | ICD-10-CM | POA: Diagnosis not present

## 2019-03-31 DIAGNOSIS — Z515 Encounter for palliative care: Secondary | ICD-10-CM | POA: Diagnosis not present

## 2019-03-31 DIAGNOSIS — H9190 Unspecified hearing loss, unspecified ear: Secondary | ICD-10-CM | POA: Diagnosis present

## 2019-03-31 DIAGNOSIS — M8580 Other specified disorders of bone density and structure, unspecified site: Secondary | ICD-10-CM | POA: Diagnosis not present

## 2019-03-31 DIAGNOSIS — S79911A Unspecified injury of right hip, initial encounter: Secondary | ICD-10-CM | POA: Diagnosis not present

## 2019-03-31 DIAGNOSIS — N3000 Acute cystitis without hematuria: Principal | ICD-10-CM | POA: Diagnosis present

## 2019-03-31 DIAGNOSIS — Z66 Do not resuscitate: Secondary | ICD-10-CM | POA: Diagnosis present

## 2019-03-31 DIAGNOSIS — I959 Hypotension, unspecified: Secondary | ICD-10-CM | POA: Diagnosis not present

## 2019-03-31 DIAGNOSIS — L8990 Pressure ulcer of unspecified site, unspecified stage: Secondary | ICD-10-CM

## 2019-03-31 LAB — URINALYSIS, COMPLETE (UACMP) WITH MICROSCOPIC
Bacteria, UA: NONE SEEN
Bilirubin Urine: NEGATIVE
Glucose, UA: NEGATIVE mg/dL
Ketones, ur: NEGATIVE mg/dL
Nitrite: NEGATIVE
Protein, ur: 100 mg/dL — AB
RBC / HPF: >50 RBC/hpf — ABNORMAL HIGH (ref 0–5)
Specific Gravity, Urine: 1.019 (ref 1.005–1.030)
Squamous Epithelial / HPF: NONE SEEN (ref 0–5)
WBC, UA: >50 WBC/hpf — ABNORMAL HIGH (ref 0–5)
pH: 5 (ref 5.0–8.0)

## 2019-03-31 LAB — CBC WITH DIFFERENTIAL/PLATELET
Abs Immature Granulocytes: 0.04 10*3/uL (ref 0.00–0.07)
Basophils Absolute: 0 10*3/uL (ref 0.0–0.1)
Basophils Relative: 1 %
Eosinophils Absolute: 0.1 10*3/uL (ref 0.0–0.5)
Eosinophils Relative: 1 %
HCT: 41.9 % (ref 36.0–46.0)
Hemoglobin: 14.1 g/dL (ref 12.0–15.0)
Immature Granulocytes: 1 %
Lymphocytes Relative: 8 %
Lymphs Abs: 0.5 10*3/uL — ABNORMAL LOW (ref 0.7–4.0)
MCH: 29.8 pg (ref 26.0–34.0)
MCHC: 33.7 g/dL (ref 30.0–36.0)
MCV: 88.6 fL (ref 80.0–100.0)
Monocytes Absolute: 0.7 10*3/uL (ref 0.1–1.0)
Monocytes Relative: 11 %
Neutro Abs: 5 10*3/uL (ref 1.7–7.7)
Neutrophils Relative %: 78 %
Platelets: 267 10*3/uL (ref 150–400)
RBC: 4.73 MIL/uL (ref 3.87–5.11)
RDW: 13.2 % (ref 11.5–15.5)
WBC: 6.4 10*3/uL (ref 4.0–10.5)
nRBC: 0 % (ref 0.0–0.2)

## 2019-03-31 LAB — BASIC METABOLIC PANEL
Anion gap: 11 (ref 5–15)
BUN: 21 mg/dL (ref 8–23)
CO2: 26 mmol/L (ref 22–32)
Calcium: 10.5 mg/dL — ABNORMAL HIGH (ref 8.9–10.3)
Chloride: 104 mmol/L (ref 98–111)
Creatinine, Ser: 1.59 mg/dL — ABNORMAL HIGH (ref 0.44–1.00)
GFR calc Af Amer: 34 mL/min — ABNORMAL LOW (ref >60)
GFR calc non Af Amer: 30 mL/min — ABNORMAL LOW (ref >60)
Glucose, Bld: 123 mg/dL — ABNORMAL HIGH (ref 70–99)
Potassium: 3.5 mmol/L (ref 3.5–5.1)
Sodium: 141 mmol/L (ref 135–145)

## 2019-03-31 LAB — TROPONIN I: Troponin I: <0.03 ng/mL (ref <0.03)

## 2019-03-31 LAB — SARS CORONAVIRUS 2 BY RT PCR (HOSPITAL ORDER, PERFORMED IN ~~LOC~~ HOSPITAL LAB): SARS Coronavirus 2: NEGATIVE

## 2019-03-31 MED ORDER — SODIUM CHLORIDE 0.9 % IV BOLUS
1000.0000 mL | Freq: Once | INTRAVENOUS | Status: AC
  Administered 2019-03-31: 1000 mL via INTRAVENOUS

## 2019-03-31 MED ORDER — SODIUM CHLORIDE 0.9 % IV SOLN
1.0000 g | Freq: Once | INTRAVENOUS | Status: AC
  Administered 2019-03-31: 1 g via INTRAVENOUS
  Filled 2019-03-31: qty 10

## 2019-03-31 NOTE — ED Provider Notes (Signed)
[facility] Emergency Department Provider Note   ____________________________________________   I have reviewed the triage vital signs and the nursing notes.   HISTORY  Chief Complaint Recurrent UTI   History limited by and level 5 caveat due to: Dementia  HPI Emily [name] is a 85 y.o. female who presents to the emergency department today via EMS because of concern for weakness and UTI. Patient herself cannot give a good history and does not know why she was sent to the emergency department. Per chart review had presented to the ED two days ago but left prior to being evaluated by a provider. Blood work and urine had been sent already however and family called primary care yesterday and patient was started on antibiotic at that time. Patient apparently received one dose last night and again this morning.    Records reviewed. Per medical record review patient has a history of presentation to the ER two days ago however left with son prior to being seen by a provider.   Past Medical History:  Diagnosis Date  . Arthritis    fingers  . Cancer ([place])    rectal  . Cancer ([place]) 2012   colon cancer  . 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 Colon cancer ([place])   . Difficult intubation   . Diverticulosis   . Dyspnea    SOB  . Hemorrhoids   . History of chicken pox   . HOH (hard of hearing)   . Hypertension   . Osteopenia   . Status post chemotherapy    colon cancer  . Status post radiation therapy    colon cancer 2013    Patient Active Problem List   Diagnosis Date Noted  . 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 Systolic dysfunction 74/06/1447  . Malnutrition of moderate degree 07/21/2016  . Lower GI bleed 07/18/2016  . History of non-ST elevation myocardial infarction (NSTEMI) 06/21/2016  . Closed right hip fracture ([place]) 06/21/2016  . Ischemic chest pain ([place]) 06/21/2016  . 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 History of colon cancer 05/09/2015  . Diverticulosis 05/09/2015  . Fatigue 05/09/2015  . Hemorrhoid 05/09/2015  . Hypertension 05/09/2015  . Insomnia 05/09/2015  . Osteopenia 05/09/2015  . Skin lesion of back 05/09/2015  . Tobacco abuse 05/09/2015  . Vitamin D deficiency 05/09/2015  . Acid reflux 06/13/2013  . Arthritis, degenerative 06/13/2013  . Disuse syndrome 06/13/2013  . Rectal cancer ([place]) 01/20/2013    Past Surgical History:  Procedure Laterality Date  . ABDOMINAL HYSTERECTOMY    . ABDOMINAL HYSTERECTOMY  1980   partial  . APPENDECTOMY    . APPENDECTOMY  [id number]   Dr. Pat Patrick  . BREAST CYST ASPIRATION Bilateral    negative  . BREAST SURGERY     cyst removal  . BREAST SURGERY  1960   Breast Biopsy  . CARDIAC CATHETERIZATION N/A 06/23/2016   Procedure: Left Heart Cath and Coronary Angiography;  Surgeon: Corey Skains, MD;  Location: [facility];  Service: Cardiovascular;  Laterality: N/A;  . CATARACT EXTRACTION W/PHACO Left 06/01/2017   Procedure: CATARACT EXTRACTION PHACO AND INTRAOCULAR LENS PLACEMENT (IOC);  Surgeon: Birder Robson, MD;  Location: [facility] OR[facility];  Service: Ophthalmology;  Laterality: Left;  [place] 00:55 AP% 19.4 CDE 10.71 Fluid pack lot [id number]  . CATARACT EXTRACTION W/PHACO Right 07/06/2017   Procedure: CATARACT EXTRACTION PHACO AND INTRAOCULAR  LENS PLACEMENT (IOC);  Surgeon: Birder Robson, MD;  Location: [facility];  Service: Ophthalmology;  Laterality: Right;  [place]  00:44.7 AP% 18.2 CDE 8.10 Fluid Pack lot # [id number]  . COLON SURGERY  06/28/2013   resected tumor from colon; [facility]  . COLONOSCOPY    . COLONOSCOPY N/A 10/01/2015   Procedure: COLONOSCOPY;  Surgeon: [name], MD;  Location: [facility]COPY;  Service: Endoscopy;  Laterality: N/A;  . DILATION AND CURETTAGE OF UTERUS  1957  . DIRECT LARYNGOSCOPY N/A 12/11/2015   Procedure: DIRECT  LARYNGOSCOPY;  Surgeon: Clyde Canterbury, MD;  Location: [facility];  Service: ENT;  Laterality: N/A;  . ESOPHAGOSCOPY N/A 12/11/2015   Procedure: ESOPHAGOSCOPY;  Surgeon: Clyde Canterbury, MD;  Location: [facility];  Service: ENT;  Laterality: N/A;  . EUS N/A 01/20/2013   Procedure: LOWER ENDOSCOPIC ULTRASOUND (EUS);  Surgeon: Milus Banister, MD;  Location: [facility];  Service: Endoscopy;  Laterality: N/A;  . JOINT REPLACEMENT    . PEG PLACEMENT N/A 01/01/2016   Procedure: PERCUTANEOUS ENDOSCOPIC GASTROSTOMY (PEG) PLACEMENT;  Surgeon: [name], MD;  Location: [facility];  Service: General;  Laterality: N/A;  . PORTACATH PLACEMENT Right 01/01/2016   Procedure: INSERTION PORT-A-CATH;  Surgeon: [name], MD;  Location: [facility];  Service: General;  Laterality: Right;  . TONSILLECTOMY    . TOTAL HIP ARTHROPLASTY Right 06/24/2016   Procedure: TOTAL HIP ARTHROPLASTY ANTERIOR APPROACH;  Surgeon: Hessie Knows, MD;  Location: [facility] ORS;  Service: Orthopedics;  Laterality: Right;    Prior to Admission medications   Medication Sig Start Date End Date Taking? Authorizing Provider  Cholecalciferol (VITAMIN D-3) 1000 units CAPS Take 1,000 Units by mouth daily.     [provider]  cyanocobalamin 1000 MCG tablet Take 1,000 mcg by mouth daily.    [provider]  donepezil (ARICEPT) 5 MG tablet Take 1 tablet (5 mg total) by mouth at bedtime. Patient not taking: Reported on 12/06/2018 03/16/18   [name], MD  lisinopril (PRINIVIL,ZESTRIL) 10 MG tablet One tablet daily 03/17/18   [name], MD  metoprolol succinate (TOPROL-XL) 25 MG 24 hr tablet Take 0.5 tablets (12.5 mg total) by mouth daily. 05/16/18   [name], MD  polyethylene glycol powder (GLYCOLAX/MIRALAX) powder Mix whole container with 64 ounces of clear liquids, No Red liquids. 12/06/18   [name], MD  sulfamethoxazole-trimethoprim (BACTRIM) 200-40 MG/5ML suspension Take 20 mLs by mouth 2 (two) times daily.  03/30/19   Chrismon, Vickki Muff, PA    Allergies Amlodipine besylate and Oxycodone  Family History  Problem Relation Age of Onset  . Kidney failure Father   . Breast cancer Maternal Grandmother     Social History Social History   Tobacco Use  . Smoking status: Former Smoker    Packs/day: 1.00    Years: 64.00    Pack years: 64.00    Types: Cigarettes  . Smokeless tobacco: Never Used  Substance Use Topics  . Alcohol use: Yes    Alcohol/week: 0.0 standard drinks    Comment: rarely  . Drug use: No    Review of Systems Unable to obtain reliable ROS secondary to dementia.  ____________________________________________   PHYSICAL EXAM:  VITAL SIGNS: ED Triage Vitals  Enc Vitals Group     BP 03/31/19 1458 (!) 148/66     Pulse Rate 03/31/19 1458 (!) 106     Resp 03/31/19 1458 (!) 21     Temp 03/31/19  1458 98.1 F (36.7 C)     Temp Source 03/31/19 1458 Oral     SpO2 03/31/19 1458 95 %     Weight 03/31/19 1459 124 lb (56.2 kg)     Height 03/31/19 1459 5\' 5"  (1.651 m)     Head Circumference --      Peak Flow --      Pain Score 03/31/19 1459 0   Constitutional: Awake and alert to name and location, not events.  Eyes: Conjunctivae are normal.  ENT      Head: Normocephalic and atraumatic.      Nose: No congestion/rhinnorhea.      Mouth/Throat: Mucous membranes are moist.      Neck: No stridor. Hematological/Lymphatic/Immunilogical: No cervical lymphadenopathy. Cardiovascular: Normal rate, regular rhythm.  No murmurs, rubs, or gallops. Respiratory: Normal respiratory effort without tachypnea nor retractions. Breath sounds are clear and equal bilaterally. No wheezes/rales/rhonchi. Gastrointestinal: Soft and non tender. No rebound. No guarding.  Genitourinary: Deferred Musculoskeletal: Normal range of motion in all extremities. No lower extremity edema. Neurologic:  Demented. Appears to be moving all extremities.  Skin:  Skin is warm, dry and intact. No rash  noted. Psychiatric: Mood and affect are normal. Speech and behavior are normal. Patient exhibits appropriate insight and judgment.  ____________________________________________    LABS (pertinent positives/negatives)  Trop <0.03 UA cloudy, large hgb dipstick, leukocytes moderate, >50 rbc and wbc, present wbc clumps BMP na 141, k 3.5, glu 123, cr 1.59 CBC wbc 6.4, hgb 14.1, plt 267 ____________________________________________   EKG  I, Nance Pear, attending physician, personally viewed and interpreted this EKG  EKG Time: 1529 Rate: 93 Rhythm: sinus rhythm Axis: borderline left axis deviation Intervals: qtc 443 QRS: low voltage ST changes: no st elevation Impression: abnormal ekg  ____________________________________________    RADIOLOGY  Right hip No acute abnormality ____________________________________________   PROCEDURES  Procedures  ____________________________________________   INITIAL IMPRESSION / ASSESSMENT AND PLAN / ED COURSE  Pertinent labs & imaging results that were available during my care of the patient were reviewed by me and considered in my medical decision making (see chart for details).   Patient presented to the emergency department today because of concerns for weakness and continued urinary tract infection.  Patient started antibiotics yesterday.  Urine continues to appear infected.  She does live alone.  This point do have concerns and think patient would best be served by admission.  Additionally nurse talk to son who stated she had a fall and was complaining of some right hip pain.  X-ray was obtained which did not show any fracture.  ____________________________________________   FINAL CLINICAL IMPRESSION(S) / ED DIAGNOSES  Final diagnoses:  Lower urinary tract infection     Note: This dictation was prepared with Dragon dictation. Any transcriptional errors that result from this process are unintentional     Nance Pear, MD 03/31/19 2023

## 2019-03-31 NOTE — ED Notes (Signed)
ED TO INPATIENT HANDOFF REPORT  ED Nurse Name and Phone #: Lelon Frohlich [phone]  S Name/Age/Gender [name] 85 y.o. female Room/Bed: ED25A/ED25A  Code Status   Code Status: Prior  Home/SNF/Other Home Patient oriented to: self Is this baseline? Yes   Triage Complete: Triage complete  Chief Complaint uti  Triage Note Pt to ED via EMS from home c/o generalized weakness and UTI for a couple days being treated for with sulfa abx.  Per EMS patient not eating and becoming more weak.  Pt with a hx of dementia, responsible for own medications with help of son who is POA.  Pt presents to ED alert and oriented to person, place and situation but disoriented to date.  EMS vitals 136/68 BP, 90 HR, 95% RA, 97.8 temp.   Allergies Allergies  Allergen Reactions  . Amlodipine Besylate Other (See Comments)    Patient unaware of any allergy with this medicine  . Oxycodone     confusion    Level of Care/Admitting Diagnosis ED Disposition    ED Disposition Condition [facility] Area: [place] [100120]  Level of Care: Med-Surg [16]  Covid Evaluation: Confirmed COVID Negative  Diagnosis: UTI (urinary tract infection) [979892]  Admitting Physician: Lance Coon [[id number]]  Attending Physician: Lance Coon [id number]  Estimated length of stay: past midnight tomorrow  Certification:: I certify this patient will need inpatient services for at least 2 midnights  PT Class (Do Not Modify): Inpatient [101]  PT Acc Code (Do Not Modify): Private [1]       B Medical/Surgery History Past Medical History:  Diagnosis Date  . Arthritis    fingers  . Cancer ([place])    rectal  . Cancer ([place]) 2012   colon cancer  . Cancer of contiguous sites of hypopharynx ([place]) 12/23/2015  . Colon cancer ([place])   . Difficult intubation   . Diverticulosis   . Dyspnea    SOB  . Hemorrhoids   . History of chicken pox   . HOH (hard of hearing)   . Hypertension   .  Osteopenia   . Status post chemotherapy    colon cancer  . Status post radiation therapy    colon cancer 2013   Past Surgical History:  Procedure Laterality Date  . ABDOMINAL HYSTERECTOMY    . ABDOMINAL HYSTERECTOMY  1980   partial  . APPENDECTOMY    . APPENDECTOMY  [id number]   Dr. Pat Patrick  . BREAST CYST ASPIRATION Bilateral    negative  . BREAST SURGERY     cyst removal  . BREAST SURGERY  1960   Breast Biopsy  . CARDIAC CATHETERIZATION N/A 06/23/2016   Procedure: Left Heart Cath and Coronary Angiography;  Surgeon: Corey Skains, MD;  Location: [facility];  Service: Cardiovascular;  Laterality: N/A;  . CATARACT EXTRACTION W/PHACO Left 06/01/2017   Procedure: CATARACT EXTRACTION PHACO AND INTRAOCULAR LENS PLACEMENT (IOC);  Surgeon: Birder Robson, MD;  Location: [facility];  Service: Ophthalmology;  Laterality: Left;  [place] 00:55 AP% 19.4 CDE 10.71 Fluid pack lot [id number]  . CATARACT EXTRACTION W/PHACO Right 07/06/2017   Procedure: CATARACT EXTRACTION PHACO AND INTRAOCULAR LENS PLACEMENT (IOC);  Surgeon: Birder Robson, MD;  Location: [facility];  Service: Ophthalmology;  Laterality: Right;  [place]  00:44.7 AP% 18.2 CDE 8.10 Fluid Pack lot # [id number]  . COLON SURGERY  06/28/2013   resected tumor from colon; [facility]  . COLONOSCOPY    .  COLONOSCOPY N/A 10/01/2015   Procedure: COLONOSCOPY;  Surgeon: [name], MD;  Location: [facility];  Service: Endoscopy;  Laterality: N/A;  . DILATION AND CURETTAGE OF UTERUS  1957  . DIRECT LARYNGOSCOPY N/A 12/11/2015   Procedure: DIRECT LARYNGOSCOPY;  Surgeon: Clyde Canterbury, MD;  Location: [facility];  Service: ENT;  Laterality: N/A;  . ESOPHAGOSCOPY N/A 12/11/2015   Procedure: ESOPHAGOSCOPY;  Surgeon: Clyde Canterbury, MD;  Location: [facility];  Service: ENT;  Laterality: N/A;  . EUS N/A 01/20/2013   Procedure: LOWER ENDOSCOPIC ULTRASOUND (EUS);  Surgeon: Milus Banister, MD;  Location: [facility];  Service:  Endoscopy;  Laterality: N/A;  . JOINT REPLACEMENT    . PEG PLACEMENT N/A 01/01/2016   Procedure: PERCUTANEOUS ENDOSCOPIC GASTROSTOMY (PEG) PLACEMENT;  Surgeon: [name], MD;  Location: [facility];  Service: General;  Laterality: N/A;  . PORTACATH PLACEMENT Right 01/01/2016   Procedure: INSERTION PORT-A-CATH;  Surgeon: [name], MD;  Location: [facility];  Service: General;  Laterality: Right;  . TONSILLECTOMY    . TOTAL HIP ARTHROPLASTY Right 06/24/2016   Procedure: TOTAL HIP ARTHROPLASTY ANTERIOR APPROACH;  Surgeon: Hessie Knows, MD;  Location: [facility];  Service: Orthopedics;  Laterality: Right;     A IV Location/Drains/Wounds Patient Lines/Drains/Airways Status   Active Line/Drains/Airways    Name:   Placement date:   Placement time:   Site:   Days:   Peripheral IV 03/31/19 Left Antecubital   03/31/19    1533    Antecubital   less than 1   Incision (Closed) 01/01/16 Neck Right   01/01/16    1625     1185   Incision (Closed) 07/20/16 Hip Right   07/20/16    2145     984   Incision (Closed) 06/01/17 Eye Left   06/01/17    0945     668   Incision (Closed) 07/06/17 Eye Right   07/06/17    1108     633   Pressure Ulcer 06/21/16 Stage II -  Partial thickness loss of dermis presenting as a shallow open ulcer with a red, pink wound bed without slough. red, broken skin   06/21/16    2000     1013   Pressure Ulcer 07/19/16 Stage I -  Intact skin with non-blanchable redness of a localized area usually over a bony prominence.   07/19/16    0257     985          Intake/Output Last 24 hours  Intake/Output Summary (Last 24 hours) at 03/31/2019 2235 Last data filed at 03/31/2019 1839 Gross per 24 hour  Intake 1100 ml  Output -  Net 1100 ml    Labs/Imaging Results for orders placed or performed during the hospital encounter of 03/31/19 (from the past 48 hour(s))  CBC with Differential     Status: Abnormal   Collection Time: 03/31/19  3:30 PM  Result Value Ref Range   WBC 6.4 4.0 -  10.5 K/uL   RBC 4.73 3.87 - 5.11 MIL/uL   Hemoglobin 14.1 12.0 - 15.0 g/dL   HCT 41.9 36.0 - 46.0 %   MCV 88.6 80.0 - 100.0 fL   MCH 29.8 26.0 - 34.0 pg   MCHC 33.7 30.0 - 36.0 g/dL   RDW 13.2 11.5 - 15.5 %   Platelets 267 150 - 400 K/uL   nRBC 0.0 0.0 - 0.2 %   Neutrophils Relative % 78 %   Neutro Abs 5.0 1.7 -  7.7 K/uL   Lymphocytes Relative 8 %   Lymphs Abs 0.5 (L) 0.7 - 4.0 K/uL   Monocytes Relative 11 %   Monocytes Absolute 0.7 0.1 - 1.0 K/uL   Eosinophils Relative 1 %   Eosinophils Absolute 0.1 0.0 - 0.5 K/uL   Basophils Relative 1 %   Basophils Absolute 0.0 0.0 - 0.1 K/uL   Immature Granulocytes 1 %   Abs Immature Granulocytes 0.04 0.00 - 0.07 K/uL    Comment: Performed at [facility], [address] [place], [place] [address]  Basic metabolic panel     Status: Abnormal   Collection Time: 03/31/19  3:30 PM  Result Value Ref Range   Sodium 141 135 - 145 mmol/L   Potassium 3.5 3.5 - 5.1 mmol/L   Chloride 104 98 - 111 mmol/L   CO2 26 22 - 32 mmol/L   Glucose, Bld 123 (H) 70 - 99 mg/dL   BUN 21 8 - 23 mg/dL   Creatinine, Ser 1.59 (H) 0.44 - 1.00 mg/dL   Calcium 10.5 (H) 8.9 - 10.3 mg/dL   GFR calc non Af Amer 30 (L) >60 mL/min   GFR calc Af Amer 34 (L) >60 mL/min   Anion gap 11 5 - 15    Comment: Performed at [facility], [address] [place], [place] [address]  Troponin I - ONCE - STAT     Status: None   Collection Time: 03/31/19  3:30 PM  Result Value Ref Range   Troponin I <0.03 <0.03 ng/mL    Comment: Performed at [facility], [address] [place], [place] [address]  Urinalysis, Complete w Microscopic     Status: Abnormal   Collection Time: 03/31/19  3:30 PM  Result Value Ref Range   Color, Urine YELLOW (A) YELLOW   APPearance CLOUDY (A) CLEAR   Specific Gravity, Urine 1.019 1.005 - 1.030   pH 5.0 5.0 - 8.0   Glucose, UA NEGATIVE NEGATIVE mg/dL   Hgb urine dipstick LARGE (A) NEGATIVE   Bilirubin Urine NEGATIVE NEGATIVE    Ketones, ur NEGATIVE NEGATIVE mg/dL   Protein, ur 100 (A) NEGATIVE mg/dL   Nitrite NEGATIVE NEGATIVE   Leukocytes,Ua MODERATE (A) NEGATIVE   RBC / HPF >50 (H) 0 - 5 RBC/hpf   WBC, UA >50 (H) 0 - 5 WBC/hpf   Bacteria, UA NONE SEEN NONE SEEN   Squamous Epithelial / LPF NONE SEEN 0 - 5   WBC Clumps PRESENT    Mucus PRESENT     Comment: Performed at [facility], [address] [place],  [address]  SARS Coronavirus 2 [facility] order, Performed in [place] hospital lab)     Status: None   Collection Time: 03/31/19  5:10 PM  Result Value Ref Range   SARS Coronavirus 2 NEGATIVE NEGATIVE    Comment: (NOTE) If result is NEGATIVE SARS-CoV-2 target nucleic acids are NOT DETECTED. The SARS-CoV-2 RNA is generally detectable in upper and lower  respiratory specimens during the acute phase of infection. The lowest  concentration of SARS-CoV-2 viral copies this assay can detect is 250  copies / mL. A negative result does not preclude SARS-CoV-2 infection  and should not be used as the sole basis for treatment or other  patient management decisions.  A negative result may occur with  improper specimen collection / handling, submission of specimen other  than nasopharyngeal swab, presence of viral mutation(s) within the  areas targeted by this assay, and  inadequate number of viral copies  (<250 copies / mL). A negative result must be combined with clinical  observations, patient history, and epidemiological information. If result is POSITIVE SARS-CoV-2 target nucleic acids are DETECTED. The SARS-CoV-2 RNA is generally detectable in upper and lower  respiratory specimens dur ing the acute phase of infection.  Positive  results are indicative of active infection with SARS-CoV-2.  Clinical  correlation with patient history and other diagnostic information is  necessary to determine patient infection status.  Positive results do  not rule out bacterial infection or co-infection  with other viruses. If result is PRESUMPTIVE POSTIVE SARS-CoV-2 nucleic acids MAY BE PRESENT.   A presumptive positive result was obtained on the submitted specimen  and confirmed on repeat testing.  While 2019 novel coronavirus  (SARS-CoV-2) nucleic acids may be present in the submitted sample  additional confirmatory testing may be necessary for epidemiological  and / or clinical management purposes  to differentiate between  SARS-CoV-2 and other Sarbecovirus currently known to infect humans.  If clinically indicated additional testing with an alternate test  methodology [phone]) is advised. The SARS-CoV-2 RNA is generally  detectable in upper and lower respiratory sp ecimens during the acute  phase of infection. The expected result is Negative. Fact Sheet for Patients:  StrictlyIdeas.no Fact Sheet for Healthcare Providers: BankingDealers.co.za This test is not yet approved or cleared by the [place] FDA and has been authorized for detection and/or diagnosis of SARS-CoV-2 by FDA under an Emergency Use Authorization (EUA).  This EUA will remain in effect (meaning this test can be used) for the duration of the COVID-19 declaration under Section 564(b)(1) of the Act, 21 U.S.C. section 360bbb-3(b)(1), unless the authorization is terminated or revoked sooner. Performed at [facility], [address], [place], [place] [address]    Dg Hip [place] Or Wo Pelvis 2-3 Views Right  Result Date: 03/31/2019 CLINICAL DATA:  Right hip pain after fall today.  Initial encounter. EXAM: DG HIP (WITH OR WITHOUT PELVIS) 2-3V RIGHT COMPARISON:  Plain films right hip 07/18/2016. FINDINGS: Right hip arthroplasty remains in place. The device is located. No fracture. No focal bony lesion. Atherosclerosis noted. IMPRESSION: No acute abnormality. Right hip arthroplasty in place without evidence of complication. Atherosclerosis. Electronically Signed    By: Inge Rise M.D.   On: 03/31/2019 20:02    Pending Labs Unresulted Labs (From admission, onward)    Start     Ordered   03/31/19 1640  Urine Culture  Add-on,   AD     03/31/19 1639   Signed and Held  CBC  (heparin)  Once,   R    Comments:  Baseline for heparin therapy IF NOT ALREADY DRAWN.  Notify MD if PLT < 100 K.    Signed and Held   Signed and Held  Creatinine, serum  (heparin)  Once,   R    Comments:  Baseline for heparin therapy IF NOT ALREADY DRAWN.    Signed and Held   Signed and Held  Basic metabolic panel  Tomorrow morning,   R     Signed and Held   Signed and Held  CBC  Tomorrow morning,   R     Signed and Held          Vitals/Pain Today's Vitals   03/31/19 2100 03/31/19 2130 03/31/19 2200 03/31/19 2230  BP: 139/63 123/60 116/68 113/89  Pulse: 84 91 82 75  Resp: 20 15 20 20   Temp:  TempSrc:      SpO2: 96% 95% 94% 93%  Weight:      Height:      PainSc:        Isolation Precautions No active isolations  Medications Medications  sodium chloride 0.9 % bolus 1,000 mL (0 mLs Intravenous Stopped 03/31/19 1839)  cefTRIAXone (ROCEPHIN) 1 g in sodium chloride 0.9 % 100 mL IVPB (0 g Intravenous Stopped 03/31/19 1839)    Mobility walks High fall risk   Focused Assessments na   R Recommendations: See Admitting Provider Note  Report given to:   Additional Notes: na

## 2019-03-31 NOTE — H&P (Signed)
[facility] [place] NAME: Emily [name]    MR#:  [id number]  DATE OF BIRTH:  1933/01/06  DATE OF ADMISSION:  03/31/2019  PRIMARY CARE PHYSICIAN: [name], MD   REQUESTING/REFERRING PHYSICIAN: Archie Balboa, MD  CHIEF COMPLAINT:   Chief Complaint  Patient presents with  . Recurrent UTI    HISTORY OF PRESENT ILLNESS:  Emily [name]  is a 85 y.o. female who presents with chief complaint as above.  Patient presents the ED with a complaint of weakness.  Reportedly she was taking Bactrim for UTI.  Patient has a history of dementia and is unable to contribute much more information to her HPI.  She is found to have a UTI here in the ED.  Antibiotics started and hospitalist called for admission  PAST MEDICAL HISTORY:   Past Medical History:  Diagnosis Date  . Arthritis    fingers  . Cancer ([place])    rectal  . Cancer ([place]) 2012   colon cancer  . Cancer of contiguous sites of hypopharynx ([place]) 12/23/2015  . Colon cancer ([place])   . Difficult intubation   . Diverticulosis   . Dyspnea    SOB  . Hemorrhoids   . History of chicken pox   . HOH (hard of hearing)   . Hypertension   . Osteopenia   . Status post chemotherapy    colon cancer  . Status post radiation therapy    colon cancer 2013     PAST SURGICAL HISTORY:   Past Surgical History:  Procedure Laterality Date  . ABDOMINAL HYSTERECTOMY    . ABDOMINAL HYSTERECTOMY  1980   partial  . APPENDECTOMY    . APPENDECTOMY  [id number]   Dr. Pat Patrick  . BREAST CYST ASPIRATION Bilateral    negative  . BREAST SURGERY     cyst removal  . BREAST SURGERY  1960   Breast Biopsy  . CARDIAC CATHETERIZATION N/A 06/23/2016   Procedure: Left Heart Cath and Coronary Angiography;  Surgeon: Corey Skains, MD;  Location: [facility];  Service: Cardiovascular;  Laterality: N/A;  . CATARACT EXTRACTION W/PHACO Left 06/01/2017   Procedure: CATARACT EXTRACTION PHACO AND INTRAOCULAR  LENS PLACEMENT (IOC);  Surgeon: Birder Robson, MD;  Location: [facility] ORS;  Service: Ophthalmology;  Laterality: Left;  [place] 00:55 AP% 19.4 CDE 10.71 Fluid pack lot [id number]  . CATARACT EXTRACTION W/PHACO Right 07/06/2017   Procedure: CATARACT EXTRACTION PHACO AND INTRAOCULAR LENS PLACEMENT (IOC);  Surgeon: Birder Robson, MD;  Location: [facility] OR[facility];  Service: Ophthalmology;  Laterality: Right;  [place]  00:44.7 AP% 18.2 CDE 8.10 Fluid Pack lot # [id number]  . COLON SURGERY  06/28/2013   resected tumor from colon; [facility]  . COLONOSCOPY    . COLONOSCOPY N/A 10/01/2015   Procedure: COLONOSCOPY;  Surgeon: [name], MD;  Location[facility];  Service: Endoscopy;  Laterality: N/A;  . DILATION AND CURETTAGE OF UTERUS  1957  . DIRECT LARYNGOSCOPY N/A 12/11/2015   Procedure: DIRECT LARYNGOSCOPY;  Surgeon: Clyde Canterbury, MD;  Location: [facility] OR[facility];  Service: ENT;  Laterality: N/A;  . ESOPHAGOSCOPY N/A 12/11/2015   Procedure: ESOPHAGOSCOPY;  Surgeon: Clyde Canterbury, MD;  Location: [facility] OR[facility];  Service: ENT;  Laterality: N/A;  . EUS N/A 01/20/2013   Procedure: LOWER ENDOSCOPIC ULTRASOUND (EUS);  Surgeon: Milus Banister, MD;  Location: [facility];  Service: Endoscopy;  Laterality: N/A;  . JOINT REPLACEMENT    . PEG PLACEMENT  N/A 01/01/2016   Procedure: PERCUTANEOUS ENDOSCOPIC GASTROSTOMY (PEG) PLACEMENT;  Surgeon: [name], MD;  Location: [facility] OR[facility];  Service: General;  Laterality: N/A;  . PORTACATH PLACEMENT Right 01/01/2016   Procedure: INSERTION PORT-A-CATH;  Surgeon: [name], MD;  Location: [facility] OR[facility];  Service: General;  Laterality: Right;  . TONSILLECTOMY    . TOTAL HIP ARTHROPLASTY Right 06/24/2016   Procedure: TOTAL HIP ARTHROPLASTY ANTERIOR APPROACH;  Surgeon: Hessie Knows, MD;  Location: [facility] ORS;  Service: Orthopedics;  Laterality: Right;     SOCIAL HISTORY:   Social History   Tobacco Use  . Smoking status: Former Smoker    Packs/day: 1.00    Years:  64.00    Pack years: 64.00    Types: Cigarettes  . Smokeless tobacco: Never Used  Substance Use Topics  . Alcohol use: Yes    Alcohol/week: 0.0 standard drinks    Comment: rarely     FAMILY HISTORY:   Family History  Problem Relation Age of Onset  . Kidney failure Father   . Breast cancer Maternal Grandmother      DRUG ALLERGIES:   Allergies  Allergen Reactions  . Amlodipine Besylate Other (See Comments)    Patient unaware of any allergy with this medicine  . Oxycodone     confusion    MEDICATIONS AT HOME:   Prior to Admission medications   Medication Sig Start Date End Date Taking? Authorizing Provider  Cholecalciferol (VITAMIN D-3) 1000 units CAPS Take 1,000 Units by mouth daily.    Yes [provider]  cyanocobalamin 1000 MCG tablet Take 1,000 mcg by mouth daily.   Yes [provider]  lisinopril (PRINIVIL,ZESTRIL) 10 MG tablet One tablet daily Patient taking differently: Take 10 mg by mouth daily.  03/17/18  Yes [name], MD  metoprolol succinate (TOPROL-XL) 25 MG 24 hr tablet Take 0.5 tablets (12.5 mg total) by mouth daily. 05/16/18  Yes [name], MD  donepezil (ARICEPT) 5 MG tablet Take 1 tablet (5 mg total) by mouth at bedtime. Patient not taking: Reported on 12/06/2018 03/16/18   [name], MD  sulfamethoxazole-trimethoprim (BACTRIM) 200-40 MG/5ML suspension Take 20 mLs by mouth 2 (two) times daily. 03/30/19   Chrismon, Vickki Muff, PA    REVIEW OF SYSTEMS:  Review of Systems  Constitutional: Negative for chills, fever, malaise/fatigue and weight loss.  HENT: Negative for ear pain, hearing loss and tinnitus.   Eyes: Negative for blurred vision, double vision, pain and redness.  Respiratory: Negative for cough, hemoptysis and shortness of breath.   Cardiovascular: Negative for chest pain, palpitations, orthopnea and leg swelling.  Gastrointestinal: Negative for abdominal pain, constipation, diarrhea, nausea and vomiting.   Genitourinary: Positive for dysuria. Negative for frequency and hematuria.  Musculoskeletal: Negative for back pain, joint pain and neck pain.  Skin:       No acne, rash, or lesions  Neurological: Negative for dizziness, tremors, focal weakness and weakness.  Endo/Heme/Allergies: Negative for polydipsia. Does not bruise/bleed easily.  Psychiatric/Behavioral: Negative for depression. The patient is not nervous/anxious and does not have insomnia.     Patient has a history of dementia and review of systems is performed, but of questionable reliability. VITAL SIGNS:   Vitals:   03/31/19 2007 03/31/19 2030 03/31/19 2100 03/31/19 2130  BP: (!) 148/79 130/64 139/63 123/60  Pulse: (!) 106 76 84 91  Resp: (!) 26 19 20 15   Temp:      TempSrc:      SpO2: Marland Kitchen)  89% 96% 96% 95%  Weight:      Height:       Wt Readings from Last 3 Encounters:  03/31/19 56.2 kg  03/29/19 56.7 kg  07/06/17 56.7 kg    PHYSICAL EXAMINATION:  Physical Exam  Vitals reviewed. Constitutional: She is oriented to person, place, and time. She appears well-developed and well-nourished. No distress.  HENT:  Head: Normocephalic and atraumatic.  Mouth/Throat: Oropharynx is clear and moist.  Eyes: Pupils are equal, round, and reactive to light. Conjunctivae and EOM are normal. No scleral icterus.  Neck: Normal range of motion. Neck supple. No JVD present. No thyromegaly present.  Cardiovascular: Normal rate, regular rhythm and intact distal pulses. Exam reveals no gallop and no friction rub.  No murmur heard. Respiratory: Effort normal and breath sounds normal. No respiratory distress. She has no wheezes. She has no rales.  GI: Soft. Bowel sounds are normal. She exhibits no distension. There is no abdominal tenderness.  Musculoskeletal: Normal range of motion.        General: No edema.     Comments: No arthritis, no gout  Lymphadenopathy:    She has no cervical adenopathy.  Neurological: She is alert and oriented to  person, place, and time. No cranial nerve deficit.  No dysarthria, no aphasia  Skin: Skin is warm and dry. No rash noted. No erythema.  Psychiatric: She has a normal mood and affect. Her behavior is normal. Judgment and thought content normal.    LABORATORY PANEL:   CBC Recent Labs  Lab 03/31/19 1530  WBC 6.4  HGB 14.1  HCT 41.9  PLT 267   ------------------------------------------------------------------------------------------------------------------  Chemistries  Recent Labs  Lab 03/31/19 1530  NA 141  K 3.5  CL 104  CO2 26  GLUCOSE 123*  BUN 21  CREATININE 1.59*  CALCIUM 10.5*   ------------------------------------------------------------------------------------------------------------------  Cardiac Enzymes Recent Labs  Lab 03/31/19 1530  TROPONINI <0.03   ------------------------------------------------------------------------------------------------------------------  RADIOLOGY:  Dg Hip Unilat W Or Wo Pelvis 2-3 Views Right  Result Date: 03/31/2019 CLINICAL DATA:  Right hip pain after fall today.  Initial encounter. EXAM: DG HIP (WITH OR WITHOUT PELVIS) 2-3V RIGHT COMPARISON:  Plain films right hip 07/18/2016. FINDINGS: Right hip arthroplasty remains in place. The device is located. No fracture. No focal bony lesion. Atherosclerosis noted. IMPRESSION: No acute abnormality. Right hip arthroplasty in place without evidence of complication. Atherosclerosis. Electronically Signed   By: Inge Rise M.D.   On: 03/31/2019 20:02    EKG:   Orders placed or performed during the hospital encounter of 03/31/19  . ED EKG  . ED EKG    IMPRESSION AND PLAN:  Principal Problem:   UTI (urinary tract infection) -IV antibiotics initiated, urine culture sent Active Problems:   Hypertension -continue home dose antihypertensives   Acute on chronic renal failure (HCC) -due to her UTI, we will administer gentle IV fluids, avoid nephrotoxins and monitor for improvement    Acid reflux -not on regular medication for this, treat PRN  Chart review performed and case discussed with ED provider. Labs, imaging and/or ECG reviewed by provider and discussed with patient/family. Management plans discussed with the patient and/or family.  COVID-19 status: Tested negative     DVT PROPHYLAXIS: SubQ heparin  GI PROPHYLAXIS:  None  ADMISSION STATUS: Inpatient     CODE STATUS: Full Code Status History    Date Active Date Inactive Code Status Order ID Comments User Context   07/19/2016 0123 07/21/2016 1905 Full Code [id number]  Harvie Bridge, [place] Inpatient   07/19/2016 0031 07/19/2016 0123 DNR [id number]  Harvie Bridge, DO ED   06/24/2016 1607 06/26/2016 1649 DNR [id number]  Hessie Knows, MD Inpatient   06/21/2016 2029 06/22/2016 0900 DNR [id number]  Vaughan Basta, MD Inpatient    Advance Directive Documentation     Most Recent Value  Type of Advance Directive  Healthcare Power of [place], Living will  Pre-existing out of facility DNR order (yellow form or pink MOST form)  -  "MOST" Form in Place?  -      TOTAL TIME TAKING CARE OF THIS PATIENT: 45 minutes.   This patient was evaluated in the context of the global COVID-19 pandemic, which necessitated consideration that the patient might be at risk for infection with the SARS-CoV-2 virus that causes COVID-19. Institutional protocols and algorithms that pertain to the evaluation of patients at risk for COVID-19 are in a state of rapid change based on information released by regulatory bodies including the CDC and federal and state organizations. These policies and algorithms were followed to the best of this provider's knowledge to date during the patient's care at this facility.  Ethlyn Daniels 03/31/2019, 10:30 PM  [facility]  Office  [phone]  CC: Primary care physician; [name], MD  Note:  This document was prepared using Dragon voice recognition software and may  include unintentional dictation errors.

## 2019-03-31 NOTE — ED Notes (Signed)
Accepting RN unable to take report at this time due to transferring another pt. Will return call.

## 2019-03-31 NOTE — ED Triage Notes (Signed)
Pt to ED via EMS from home c/o generalized weakness and UTI for a couple days being treated for with sulfa abx.  Per EMS patient not eating and becoming more weak.  Pt with a hx of dementia, responsible for own medications with help of son who is POA.  Pt presents to ED alert and oriented to person, place and situation but disoriented to date.  EMS vitals 136/68 BP, 90 HR, 95% RA, 97.8 temp.

## 2019-03-31 NOTE — ED Notes (Signed)
This RN spoke with [name], pt's son and HPOA, with updates about patient's care and admission status.  Son showed concerns ability to care for self adequately at home.

## 2019-03-31 NOTE — ED Notes (Signed)
Patient transported to X-ray 

## 2019-04-01 ENCOUNTER — Inpatient Hospital Stay: Payer: Medicare Other

## 2019-04-01 DIAGNOSIS — L8990 Pressure ulcer of unspecified site, unspecified stage: Secondary | ICD-10-CM

## 2019-04-01 LAB — URINE CULTURE: Culture: NO GROWTH

## 2019-04-01 LAB — CBC
HCT: 38.8 % (ref 36.0–46.0)
Hemoglobin: 12.8 g/dL (ref 12.0–15.0)
MCH: 29.4 pg (ref 26.0–34.0)
MCHC: 33 g/dL (ref 30.0–36.0)
MCV: 89.2 fL (ref 80.0–100.0)
Platelets: 230 10*3/uL (ref 150–400)
RBC: 4.35 MIL/uL (ref 3.87–5.11)
RDW: 13.4 % (ref 11.5–15.5)
WBC: 5.3 10*3/uL (ref 4.0–10.5)
nRBC: 0 % (ref 0.0–0.2)

## 2019-04-01 LAB — BASIC METABOLIC PANEL
Anion gap: 7 (ref 5–15)
BUN: 19 mg/dL (ref 8–23)
CO2: 26 mmol/L (ref 22–32)
Calcium: 9.7 mg/dL (ref 8.9–10.3)
Chloride: 107 mmol/L (ref 98–111)
Creatinine, Ser: 1.26 mg/dL — ABNORMAL HIGH (ref 0.44–1.00)
GFR calc Af Amer: 46 mL/min — ABNORMAL LOW (ref >60)
GFR calc non Af Amer: 39 mL/min — ABNORMAL LOW (ref >60)
Glucose, Bld: 127 mg/dL — ABNORMAL HIGH (ref 70–99)
Potassium: 3.9 mmol/L (ref 3.5–5.1)
Sodium: 140 mmol/L (ref 135–145)

## 2019-04-01 MED ORDER — ACETAMINOPHEN 650 MG RE SUPP: 650.0000 mg | Freq: Four times a day (QID) | RECTAL | Status: DC | PRN

## 2019-04-01 MED ORDER — TRAMADOL HCL 50 MG PO TABS: 50.0000 mg | ORAL_TABLET | Freq: Two times a day (BID) | ORAL | Status: DC | PRN

## 2019-04-01 MED ORDER — ACETAMINOPHEN 325 MG PO TABS
650.0000 mg | ORAL_TABLET | Freq: Four times a day (QID) | ORAL | Status: DC | PRN
  Administered 2019-04-04: 650 mg via ORAL
  Filled 2019-04-01: qty 2

## 2019-04-01 MED ORDER — SODIUM CHLORIDE 0.9 % IV SOLN
INTRAVENOUS | Status: DC
  Administered 2019-04-01: 01:00:00 via INTRAVENOUS

## 2019-04-01 MED ORDER — HEPARIN SODIUM (PORCINE) 5000 UNIT/ML IJ SOLN
5000.0000 [IU] | Freq: Three times a day (TID) | INTRAMUSCULAR | Status: DC
  Administered 2019-04-01: 5000 [IU] via SUBCUTANEOUS
  Filled 2019-04-01: qty 1

## 2019-04-01 MED ORDER — SODIUM CHLORIDE 0.9 % IV SOLN
1.0000 g | INTRAVENOUS | Status: DC
  Administered 2019-04-01 – 2019-04-03 (×3): 1 g via INTRAVENOUS
  Filled 2019-04-01: qty 1
  Filled 2019-04-01: qty 10
  Filled 2019-04-01: qty 1
  Filled 2019-04-01: qty 10

## 2019-04-01 MED ORDER — POLYETHYLENE GLYCOL 3350 17 G PO PACK: 17.0000 g | PACK | Freq: Every day | ORAL | Status: DC | PRN

## 2019-04-01 MED ORDER — ONDANSETRON HCL 4 MG/2ML IJ SOLN
4.0000 mg | Freq: Four times a day (QID) | INTRAMUSCULAR | Status: DC | PRN
  Administered 2019-04-04: 4 mg via INTRAVENOUS
  Filled 2019-04-01: qty 2

## 2019-04-01 MED ORDER — SODIUM CHLORIDE 0.9% FLUSH
3.0000 mL | INTRAVENOUS | Status: DC | PRN
  Administered 2019-04-01: 18:00:00 3 mL via INTRAVENOUS
  Filled 2019-04-01: qty 3

## 2019-04-01 MED ORDER — METOPROLOL SUCCINATE ER 25 MG PO TB24
12.5000 mg | ORAL_TABLET | Freq: Every day | ORAL | Status: DC
  Administered 2019-04-01 – 2019-04-04 (×4): 12.5 mg via ORAL
  Filled 2019-04-01 (×4): qty 1

## 2019-04-01 MED ORDER — ONDANSETRON HCL 4 MG PO TABS: 4.0000 mg | ORAL_TABLET | Freq: Four times a day (QID) | ORAL | Status: DC | PRN

## 2019-04-01 MED ORDER — DONEPEZIL HCL 5 MG PO TABS
5.0000 mg | ORAL_TABLET | Freq: Every day | ORAL | Status: DC
  Administered 2019-04-01 – 2019-04-03 (×3): 5 mg via ORAL
  Filled 2019-04-01 (×3): qty 1

## 2019-04-01 MED ORDER — SODIUM CHLORIDE 0.9% FLUSH
3.0000 mL | Freq: Two times a day (BID) | INTRAVENOUS | Status: DC
  Administered 2019-04-01 – 2019-04-04 (×4): 3 mL via INTRAVENOUS

## 2019-04-01 MED ORDER — POLYETHYLENE GLYCOL 3350 17 G PO PACK
17.0000 g | PACK | Freq: Every day | ORAL | Status: DC
  Administered 2019-04-01: 17 g via ORAL
  Filled 2019-04-01: qty 1

## 2019-04-01 MED ORDER — SENNOSIDES-DOCUSATE SODIUM 8.6-50 MG PO TABS
1.0000 | ORAL_TABLET | Freq: Two times a day (BID) | ORAL | Status: DC
  Administered 2019-04-01: 1 via ORAL
  Filled 2019-04-01: qty 1

## 2019-04-01 MED ORDER — ENOXAPARIN SODIUM 30 MG/0.3ML ~~LOC~~ SOLN
30.0000 mg | SUBCUTANEOUS | Status: DC
  Administered 2019-04-01 – 2019-04-03 (×3): 30 mg via SUBCUTANEOUS
  Filled 2019-04-01 (×3): qty 0.3

## 2019-04-01 MED ORDER — LISINOPRIL 10 MG PO TABS
10.0000 mg | ORAL_TABLET | Freq: Every day | ORAL | Status: DC
  Administered 2019-04-01 – 2019-04-04 (×4): 10 mg via ORAL
  Filled 2019-04-01 (×4): qty 1

## 2019-04-01 NOTE — Progress Notes (Signed)
Pt reports she felt constipated in that her stomach felt unsetted. Unknown last BM with laxative order obtained and given- pt resulted in having loose brown stools with laxative orders discontinued. Appetite fair; has to be reminded to eat. Up to BR with SB+ with pt activating bed alarm x 1. IV antibiotic therapy continued.

## 2019-04-01 NOTE — Plan of Care (Signed)

## 2019-04-01 NOTE — Progress Notes (Signed)
PHARMACIST - PHYSICIAN COMMUNICATION  CONCERNING:  Enoxaparin (Lovenox) for DVT Prophylaxis    RECOMMENDATION: Patient was prescribed enoxaprin 40mg  q24 hours for VTE prophylaxis.   Filed Weights   03/31/19 1459 04/01/19 0023  Weight: 124 lb (56.2 kg) 115 lb 3.2 oz (52.3 kg)    Body mass index is 19.17 kg/m.  Estimated Creatinine Clearance: 27.9 mL/min (A) (by C-G formula based on SCr of 1.26 mg/dL (H)).   Patient is candidate for enoxaparin 30mg  every 24 hours based on CrCl <26ml/min or Weight less then 45kg for female and 50kg for female  DESCRIPTION: Pharmacy has adjusted enoxaparin dose per [facility] policy.   Patient is now receiving enoxaparin 30mg  every 24 hours.  Rowland Lathe, PharmD Clinical Pharmacist  04/01/2019 2:11 PM

## 2019-04-01 NOTE — Progress Notes (Signed)
[facility] [place] NAME: Emily [name]    MR#:  [id number]  DATE OF BIRTH:  1933/03/15  SUBJECTIVE:  CHIEF COMPLAINT:   Chief Complaint  Patient presents with  . Recurrent UTI   -No complaints.  Abdominal pain is improved.  Constipation is relieved  REVIEW OF SYSTEMS:  Review of Systems  Constitutional: Positive for malaise/fatigue. Negative for chills and fever.  HENT: Negative for ear discharge, hearing loss and nosebleeds.   Eyes: Negative for blurred vision and double vision.  Respiratory: Negative for cough, shortness of breath and wheezing.   Cardiovascular: Negative for chest pain, palpitations and leg swelling.  Gastrointestinal: Positive for constipation. Negative for abdominal pain, diarrhea, nausea and vomiting.  Genitourinary: Negative for dysuria.  Musculoskeletal: Negative for myalgias.  Neurological: Negative for dizziness, focal weakness, seizures, weakness and headaches.  Psychiatric/Behavioral: Negative for depression.    DRUG ALLERGIES:   Allergies  Allergen Reactions  . Amlodipine Besylate Other (See Comments)    Patient unaware of any allergy with this medicine  . Oxycodone     confusion    VITALS:  Blood pressure 134/62, pulse 96, temperature 99.1 F (37.3 C), temperature source Oral, resp. rate 20, height 5\' 5"  (1.651 m), weight 52.3 kg, SpO2 97 %.  PHYSICAL EXAMINATION:  Physical Exam   GENERAL:  85 y.o.-year-old elderly patient lying in the bed with no acute distress.  EYES: Pupils equal, round, reactive to light and accommodation. No scleral icterus. Extraocular muscles intact.  HEENT: Head atraumatic, normocephalic. Oropharynx and nasopharynx clear.  NECK:  Supple, no jugular venous distention. No thyroid enlargement, no tenderness.  LUNGS: Normal breath sounds bilaterally, no wheezing, rales,rhonchi or crepitation. No use of accessory muscles of respiration. Decreased basilar breath sounds  CARDIOVASCULAR: S1, S2 normal. No  rubs, or gallops.  2/6 systolic murmur is present ABDOMEN: Soft, nontender, nondistended. Bowel sounds present. No organomegaly or mass.  EXTREMITIES: No pedal edema, cyanosis, or clubbing.  NEUROLOGIC: Cranial nerves II through XII are intact. Muscle strength 5/5 in all extremities. Sensation intact. Gait not checked.  Global weakness noted PSYCHIATRIC: The patient is alert and oriented to self SKIN: No obvious rash, lesion, or ulcer.    LABORATORY PANEL:   CBC Recent Labs  Lab 04/01/19 0242  WBC 5.3  HGB 12.8  HCT 38.8  PLT 230   ------------------------------------------------------------------------------------------------------------------  Chemistries  Recent Labs  Lab 04/01/19 0242  NA 140  K 3.9  CL 107  CO2 26  GLUCOSE 127*  BUN 19  CREATININE 1.26*  CALCIUM 9.7   ------------------------------------------------------------------------------------------------------------------  Cardiac Enzymes Recent Labs  Lab 03/31/19 1530  TROPONINI <0.03   ------------------------------------------------------------------------------------------------------------------  RADIOLOGY:  Dg Hip Unilat W Or Wo Pelvis 2-3 Views Right  Result Date: 03/31/2019 CLINICAL DATA:  Right hip pain after fall today.  Initial encounter. EXAM: DG HIP (WITH OR WITHOUT PELVIS) 2-3V RIGHT COMPARISON:  Plain films right hip 07/18/2016. FINDINGS: Right hip arthroplasty remains in place. The device is located. No fracture. No focal bony lesion. Atherosclerosis noted. IMPRESSION: No acute abnormality. Right hip arthroplasty in place without evidence of complication. Atherosclerosis. Electronically Signed   By: Emily [name] M.D.   On: 03/31/2019 20:02    EKG:   Orders placed or performed during the hospital encounter of 03/31/19  . ED EKG  . ED EKG    ASSESSMENT AND PLAN:   85 year old female with past medical history significant for dementia, history  of rectal cancer, dyspnea  presents from [facility] secondary to weakness and noted to have UTI.  1.  Acute cystitis-urine cultures have been sent for.  Currently on Rocephin. -Physical therapy consult requested  2.  Hypertension-on lisinopril and metoprolol  3.  Dementia-pleasantly confused, seems to be at baseline.  Continue Aricept  4.  DVT prophylaxis-Lovenox  Physical therapy consulted    All the records are reviewed and case discussed with Care Management/Social Workerr. Management plans discussed with the patient, family and they are in agreement.  CODE STATUS: Full Code  TOTAL TIME TAKING CARE OF THIS PATIENT: 38 minutes.   POSSIBLE D/C IN 1-2 DAYS, DEPENDING ON CLINICAL CONDITION.   [name] M.D on 04/01/2019 at 1:55 PM  Between 7am to 6pm - Pager - [phone]  After 6pm go to [email or web address] - password [email or web address] [facility]  Office  [phone]  CC: Primary care physician; [name], MD

## 2019-04-02 MED ORDER — ENSURE ENLIVE PO LIQD
237.0000 mL | Freq: Two times a day (BID) | ORAL | Status: DC
  Administered 2019-04-02 – 2019-04-04 (×4): 237 mL via ORAL

## 2019-04-02 MED ORDER — SODIUM CHLORIDE 0.9 % IV SOLN
INTRAVENOUS | Status: AC
  Administered 2019-04-02 – 2019-04-03 (×2): via INTRAVENOUS

## 2019-04-02 NOTE — Progress Notes (Signed)
[place] [facility] [place] NAME: Emily [name]    MR#:  [id number]  DATE OF BIRTH:  [birth year]  SUBJECTIVE:  CHIEF COMPLAINT:   Chief Complaint  Patient presents with  . Recurrent UTI   - feels about the same - poor oral intake  REVIEW OF SYSTEMS:  Review of Systems  Constitutional: Positive for malaise/fatigue. Negative for chills and fever.  HENT: Negative for ear discharge, hearing loss and nosebleeds.   Eyes: Negative for blurred vision and double vision.  Respiratory: Negative for cough, shortness of breath and wheezing.   Cardiovascular: Negative for chest pain, palpitations and leg swelling.  Gastrointestinal: Positive for constipation. Negative for abdominal pain, diarrhea, nausea and vomiting.  Genitourinary: Negative for dysuria.  Musculoskeletal: Negative for myalgias.  Neurological: Negative for dizziness, focal weakness, seizures, weakness and headaches.  Psychiatric/Behavioral: Negative for depression.    DRUG ALLERGIES:   Allergies  Allergen Reactions  . Amlodipine Besylate Other (See Comments)    Patient unaware of any allergy with this medicine  . Oxycodone     confusion    VITALS:  Blood pressure (!) 106/52, pulse 79, temperature 97.9 F (36.6 C), temperature source Oral, resp. rate 20, height 5\' 5"  (1.651 m), weight 52.3 kg, SpO2 94 %.  PHYSICAL EXAMINATION:  Physical Exam   GENERAL:  85 y.o.-year-old elderly patient lying in the bed with no acute distress.  EYES: Pupils equal, round, reactive to light and accommodation. No scleral icterus. Extraocular muscles intact.  HEENT: Head atraumatic, normocephalic. Oropharynx and nasopharynx clear.  NECK:  Supple, no jugular venous distention. No thyroid enlargement, no tenderness.  LUNGS: Normal breath sounds bilaterally, no wheezing, rales,rhonchi or crepitation. No use of accessory muscles of respiration. Decreased basilar breath sounds CARDIOVASCULAR: S1, S2  normal. No  rubs, or gallops.  2/6 systolic murmur is present ABDOMEN: Soft, nontender, nondistended. Bowel sounds present. No organomegaly or mass.  EXTREMITIES: No pedal edema, cyanosis, or clubbing.  NEUROLOGIC: Cranial nerves II through XII are intact. Muscle strength 5/5 in all extremities. Sensation intact. Gait not checked.  Global weakness noted PSYCHIATRIC: The patient is alert and oriented to self SKIN: No obvious rash, lesion, or ulcer.    LABORATORY PANEL:   CBC Recent Labs  Lab 04/01/19 0242  WBC 5.3  HGB 12.8  HCT 38.8  PLT 230   ------------------------------------------------------------------------------------------------------------------  Chemistries  Recent Labs  Lab 04/01/19 0242  NA 140  K 3.9  CL 107  CO2 26  GLUCOSE 127*  BUN 19  CREATININE 1.26*  CALCIUM 9.7   ------------------------------------------------------------------------------------------------------------------  Cardiac Enzymes Recent Labs  Lab 03/31/19 1530  TROPONINI <0.03   ------------------------------------------------------------------------------------------------------------------  RADIOLOGY:  Ct Head Wo Contrast  Result Date: 04/01/2019 CLINICAL DATA:  History of dementia.  Worsened generalized weakness. EXAM: CT HEAD WITHOUT CONTRAST TECHNIQUE: Contiguous axial images were obtained from the base of the skull through the vertex without intravenous contrast. COMPARISON:  06/21/2016 FINDINGS: Brain: No significant change. Generalized atrophy. Mild chronic small-vessel ischemic changes of the cerebral hemispheric white matter. No sign of acute or subacute infarction, mass lesion, hemorrhage, hydrocephalus or extra-axial collection. Vascular: There is atherosclerotic calcification of the major vessels at the base of the brain. Skull: Negative Sinuses/Orbits: Clear/normal Other: None IMPRESSION: No acute or reversible finding. Atrophy and chronic small-vessel ischemic changes,  similar to the study of 2017. Electronically Signed   By: Nelson Chimes M.D.   On: 04/01/2019 14:52   Dg Hip Unilat W Or  Wo Pelvis 2-3 Views Right  Result Date: 03/31/2019 CLINICAL DATA:  Right hip pain after fall today.  Initial encounter. EXAM: DG HIP (WITH OR WITHOUT PELVIS) 2-3V RIGHT COMPARISON:  Plain films right hip 07/18/2016. FINDINGS: Right hip arthroplasty remains in place. The device is located. No fracture. No focal bony lesion. Atherosclerosis noted. IMPRESSION: No acute abnormality. Right hip arthroplasty in place without evidence of complication. Atherosclerosis. Electronically Signed   By: Inge Rise M.D.   On: 03/31/2019 20:02    EKG:   Orders placed or performed during the hospital encounter of 03/31/19  . ED EKG  . ED EKG    ASSESSMENT AND PLAN:   85 year old female with past medical history significant for dementia, history of rectal cancer, dyspnea presents from [facility] secondary to weakness and noted to have UTI.  1.  Acute cystitis-urine cultures have been sent for.  Currently on Rocephin. -Physical therapy consult requested  2.  Hypertension-on lisinopril and metoprolol  3.  Dementia-pleasantly confused, seems to be at baseline.  Continue Aricept -CT head with atrophy but no significant changes  4.  DVT prophylaxis-Lovenox  5.  Poor oral intake-started IV fluids as urine is darker.  According to son and daughter-in-law, patient's intake has been extremely poor for a few weeks now.  She feels like food is stuck and does not want to eat. -Ordered a barium swallow for tomorrow a.m.  Could be dementia causing poor oral intake.  No other cause identified at this time.  Physical therapy consulted-they have recommended rehab at this point Updated son over the phone again today  Palliative care consulted   All the records are reviewed and case discussed with Care Management/Social Workerr. Management plans discussed with the patient, family and they are  in agreement.  CODE STATUS: Full Code  TOTAL TIME TAKING CARE OF THIS PATIENT: 38 minutes.   POSSIBLE D/C IN 1-2 DAYS, DEPENDING ON CLINICAL CONDITION.   [name] M.D on 04/02/2019 at 11:40 AM  Between 7am to 6pm - Pager - [phone]  After 6pm go to [email or web address] - password [email or web address] [facility]  Office  [phone]  CC: Primary care physician; [name], MD

## 2019-04-02 NOTE — Progress Notes (Signed)
Initial Nutrition Assessment  DOCUMENTATION CODES:   Underweight  INTERVENTION:   Ensure Enlive po BID, each supplement provides 350 kcal and 20 grams of protein  Dysphagia 3 diet   NUTRITION DIAGNOSIS:   Inadequate oral intake related to chronic illness(dementia ) as evidenced by meal completion < 50%.  GOAL:   Patient will meet greater than or equal to 90% of their needs  MONITOR:   PO intake, Supplement acceptance, Labs, Weight trends, I & O's, Skin  REASON FOR ASSESSMENT:   Malnutrition Screening Tool    ASSESSMENT:   85 year old female with past medical history significant for dementia, history of rectal cancer, dyspnea presents from [facility] secondary to weakness and noted to have UTI.  RD working remotely.  Unable to speak with pt r/t dementia. Per chart review, pt eating 30% of meals in hospital. RD will add supplements and change pt to a dysphagia 3 diet. Per chart, pt appears fairly weight stable pta. Palliative care consult pending.   Pt at high risk for malnutrition but unable to diagnose at this time as NFPE can not be performed.   Medications reviewed and include: lovenox, NaCl @75ml /hr, ceftriaxone   Labs reviewed: creat 1.26(H)  Unable to complete Nutrition-Focused physical exam at this time.   Diet Order:   Diet Order            DIET DYS 3 Room service appropriate? Yes; Fluid consistency: Thin  Diet effective now             EDUCATION NEEDS:   Not appropriate for education at this time  Skin:  Skin Assessment: Reviewed RN Assessment(Stage I sacrum )  Last BM:  5/31- TYPE 4  Height:   Ht Readings from Last 1 Encounters:  04/01/19 5\' 5"  (1.651 m)    Weight:   Wt Readings from Last 1 Encounters:  04/01/19 52.3 kg    Ideal Body Weight:  56.8 kg  BMI:  Body mass index is 19.17 kg/m.  Estimated Nutritional Needs:   Kcal:  1300-1500kcal/day   Protein:  65-75g/day   Fluid:  >1.3L/day   Koleen Distance MS, RD, LDN Pager  #- [phone] Office#- [phone] After Hours Pager: [phone]

## 2019-04-02 NOTE — Progress Notes (Addendum)
Alert, pleasant, forgetful/confused. Continues to have poor appetite; states she does not want it and does not like it. Drank 1/2 ensure enlive and states she does not like it. Up in chair x 2 hours and tolerated.  IVF's restarted and infusing readily. Changed to DNR. Scheduled for EGD tomorrow.

## 2019-04-02 NOTE — Evaluation (Signed)
Physical Therapy Evaluation Patient Details Name: Emily [name] MRN: [id number] DOB: 1932/11/19 Today's Date: 04/02/2019   History of Present Illness  85 year old female with past medical history significant for dementia, history of rectal cancer, dyspnea presents from [facility] secondary to weakness and noted to have UTI.    Clinical Impression  Pt alert, oriented to self, aware that she is in the hospital but unaware of location or what day it is. Consistently able to follow commands, and redirection to task often needed. Pt able to provide some PLOF information but often stated she was very unsure/unclear answers. With pt permission, Pt spoke with pt son on phone. Family stated prior to 3-4wks ago, pt ambulated independently and able to care for herself. In the last 3-4wks family has noticed an acute decline in function (uses RW now and has had a fall) and a decline in mental status (potentially worsening dementia.) Pt [facility] at [facility].   Pt was able to mobilize to EOB with CGA, use of bed rails and HOB elevated. Sitting balance fair. Sit<> stand with RWx2 thi session. First trial from bed minA to achieve full elevation and maintain balance, second attempt from bedside chair CGA and RW. Pt ambulated ~52ft with RW, CGA. Pt exhibited short strides, narrow base of support, significantly decreased gait velocity. Pt endorsed fatigue as well.  Overall the patient demonstrated deficits (see "PT Problem List") that impede the patient's functional abilities, safety, and mobility and would benefit from skilled PT intervention. Recommendation is STR due recent acute decline in function, current level of assistance needed, as well as decreased caregiver support.     Follow Up Recommendations SNF;Supervision for mobility/OOB    Equipment Recommendations  Other (comment)(TBD at next venue)    Recommendations for Other Services       Precautions / Restrictions Precautions Precautions:  Fall Restrictions Weight Bearing Restrictions: No      Mobility  Bed Mobility Overal bed mobility: Needs Assistance Bed Mobility: Supine to Sit     Supine to sit: HOB elevated;Min guard        Transfers Overall transfer level: Needs assistance Equipment used: Rolling walker (2 wheeled) Transfers: Sit to/from Stand Sit to Stand: Min assist;Min guard         General transfer comment: second attempt from chair, CGA with RW. first attempt from bed minA to achieve full elevation and maintain balance  Ambulation/Gait   Gait Distance (Feet): 50 Feet Assistive device: Rolling walker (2 wheeled)   Gait velocity: decreased   General Gait Details: Short strides, narrow base of support, significantly decreased gait velocity. Pt endorsed fatigue quickly.   Stairs            Wheelchair Mobility    Modified Rankin (Stroke Patients Only)       Balance Overall balance assessment: Needs assistance Sitting-balance support: Feet supported Sitting balance-Leahy Scale: Fair       Standing balance-Leahy Scale: Poor                               Pertinent Vitals/Pain Pain Assessment: No/denies pain    Home Living Family/patient expects to be discharged to:: Private residence Living Arrangements: Alone Available Help at Discharge: Available PRN/intermittently;Family Type of Home: Independent living facility       Home Layout: One level   Additional Comments: Facility can provide cleaning. Does not offer supervision    Prior Function  Comments: PT spoke with family, pt unreliable historian. Prior to 3-4wks ago, pt ambulated independently and able to care for herself. In the last 3-4wks family has noticed an acute decline in function (uses RW now and has had a fall) and a decline in mental status (potentially worsening dementia.)     Hand Dominance        Extremity/Trunk Assessment   Upper Extremity Assessment Upper Extremity  Assessment: Generalized weakness    Lower Extremity Assessment Lower Extremity Assessment: Generalized weakness       Communication   Communication: No difficulties(intermittently confused but able to follow commands consistently)  Cognition Arousal/Alertness: Awake/alert Behavior During Therapy: WFL for tasks assessed/performed Overall Cognitive Status: History of cognitive impairments - at baseline                                 General Comments: Pt endorses worsening dementia in the last month      General Comments      Exercises     Assessment/Plan    PT Assessment Patient needs continued PT services  PT Problem List Decreased strength;Decreased mobility;Decreased safety awareness;Decreased activity tolerance;Decreased balance;Decreased cognition;Decreased knowledge of use of DME       PT Treatment Interventions DME instruction;Functional mobility training;Balance training;Patient/family education;Gait training;Therapeutic activities;Neuromuscular re-education;Therapeutic exercise;Stair training    PT Goals (Current goals can be found in the Care Plan section)  Acute Rehab PT Goals Patient Stated Goal: to get better PT Goal Formulation: With patient Time For Goal Achievement: 04/16/19 Potential to Achieve Goals: Fair    Frequency Min 2X/week   Barriers to discharge Decreased caregiver support      Co-evaluation               AM-PAC PT "6 Clicks" Mobility  Outcome Measure Help needed turning from your back to your side while in a flat bed without using bedrails?: A Little Help needed moving from lying on your back to sitting on the side of a flat bed without using bedrails?: A Little Help needed moving to and from a bed to a chair (including a wheelchair)?: A Little Help needed standing up from a chair using your arms (e.g., wheelchair or bedside chair)?: A Little Help needed to walk in hospital room?: A Little Help needed climbing 3-5  steps with a railing? : Total 6 Click Score: 16    End of Session Equipment Utilized During Treatment: Gait belt Activity Tolerance: Patient limited by fatigue Patient left: with call bell/phone within reach;in chair;with chair alarm set Nurse Communication: Mobility status PT Visit Diagnosis: Unsteadiness on feet (R26.81);Muscle weakness (generalized) (M62.81);Other abnormalities of gait and mobility (R26.89)    Time: 1779-3903 PT Time Calculation (min) (ACUTE ONLY): 29 min   Charges:   PT Evaluation $PT Eval Low Complexity: 1 Low PT Treatments $Therapeutic Exercise: 8-22 mins        Lieutenant Diego PT, DPT 10:52 AM,04/02/19 [phone]

## 2019-04-02 NOTE — Progress Notes (Signed)
   [place] at [place] Hospital Day: 2 days Emily [name] is a 85 y.o. female presenting with Recurrent UTI .   Advance care planning discussed with patient's son Mr. Tayanna Talford over the phone.  Patient is unable to participate in the discussion given her advanced dementia. Son is concerned about mom's inability to improve oral intake.  IV fluids have been started today.  Palliative care consult has been placed.  Patient is from an independent apartment at [facility], not safe to return back at this time.  Physical therapy recommended skilled rehab.  Might benefit from hospice services. Son has mentioned that Ms. Lichtenberg is a DNR.  He has her living will and he is the healthcare power of attorney.  all questions in regards to overall condition and expected prognosis answered. The decision was made to continue her code status  CODE STATUS: DNR Time spent: 18 minutes

## 2019-04-03 ENCOUNTER — Inpatient Hospital Stay: Payer: Medicare Other

## 2019-04-03 MED ORDER — SODIUM CHLORIDE 0.9 % IV SOLN
INTRAVENOUS | Status: DC | PRN
  Administered 2019-04-03: 19:00:00 via INTRAVENOUS

## 2019-04-03 MED ORDER — SODIUM CHLORIDE 0.9 % IV SOLN: INTRAVENOUS | Status: DC

## 2019-04-03 NOTE — NC FL2 (Signed)
[place] LEVEL OF CARE SCREENING TOOL     IDENTIFICATION  Patient Name: Emily [name] Birthdate: 08-03-1933 Sex: female Admission Date (Current Location): 03/31/2019  [place] and [place] Number:  Engineering geologist and Address:  [facility], [address], [place], [place] [address]      Provider Number: [id number]  Attending Physician Name and Address:  [name], MD  Relative Name and Phone Number:  [name] [phone]    Current Level of Care: Hospital Recommended Level of Care: [place] Prior Approval Number:    Date Approved/Denied: 06/24/16 PASRR Number: [id number]  Discharge Plan: SNF    Current Diagnoses: Patient Active Problem List   Diagnosis Date Noted  . Pressure injury of skin 04/01/2019  . UTI (urinary tract infection) 03/31/2019  . Acute on chronic renal failure ([place]) 03/31/2019  . 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 Systolic dysfunction 75/64/3329  . Malnutrition of moderate degree 07/21/2016  . Lower GI bleed 07/18/2016  . History of non-ST elevation myocardial infarction (NSTEMI) 06/21/2016  . Closed right hip fracture ([place]) 06/21/2016  . Ischemic chest pain ([place]) 06/21/2016  . 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 Cancer of contiguous sites of hypopharynx ([place]) 12/23/2015  . History of colon cancer 05/09/2015  . Diverticulosis 05/09/2015  . Fatigue 05/09/2015  . Hemorrhoid 05/09/2015  . Hypertension 05/09/2015  . Insomnia 05/09/2015  . Osteopenia 05/09/2015  . Skin lesion of back 05/09/2015  . Tobacco abuse 05/09/2015  . Vitamin D deficiency 05/09/2015  . Acid reflux 06/13/2013  . Arthritis, degenerative 06/13/2013  . Disuse syndrome 06/13/2013  . Rectal cancer ([place]) 01/20/2013    Orientation RESPIRATION BLADDER Height & Weight     Self, Place  Normal Continent Weight: 52.3 kg Height:  5\' 5"  (165.1 cm)  BEHAVIORAL SYMPTOMS/MOOD NEUROLOGICAL BOWEL  NUTRITION STATUS      Continent Diet(Dysphasia 3 thin liquid)  AMBULATORY STATUS COMMUNICATION OF NEEDS Skin   Extensive Assist Verbally Normal, Surgical wounds                       Personal Care Assistance Level of Assistance  Dressing, Bathing Bathing Assistance: Limited assistance   Dressing Assistance: Limited assistance     Functional Limitations Info  Sight, Hearing, Speech Sight Info: Adequate Hearing Info: Adequate Speech Info: Adequate    SPECIAL CARE FACTORS FREQUENCY                       Contractures Contractures Info: Not present    Additional Factors Info  Code Status, Allergies Code Status Info: DNR Allergies Info: AMlodipine, Oxycodone           Current Medications (04/03/2019):  This is the current hospital active medication list Current Facility-Administered Medications  Medication Dose Route Frequency Provider Last Rate Last Dose  . acetaminophen (TYLENOL) tablet 650 mg  650 mg Oral Q6H PRN Lance Coon, MD       Or  . acetaminophen (TYLENOL) suppository 650 mg  650 mg Rectal Q6H PRN Lance Coon, MD      . cefTRIAXone (ROCEPHIN) 1 g in sodium chloride 0.9 % 100 mL IVPB  1 g Intravenous Q24H Lance Coon, MD   Stopped at 04/02/19 1746  . donepezil (ARICEPT) tablet 5 mg  5 mg Oral QHS [name], MD   5 mg at 04/02/19 2114  . enoxaparin (LOVENOX) injection 30 mg  30 mg Subcutaneous Q24H Ericson, Radhika,  MD   30 mg at 04/02/19 2115  . feeding supplement (ENSURE ENLIVE) (ENSURE ENLIVE) liquid 237 mL  237 mL Oral BID BM [name], MD   237 mL at 04/03/19 1342  . lisinopril (ZESTRIL) tablet 10 mg  10 mg Oral Daily Lance Coon, MD   10 mg at 04/03/19 1105  . metoprolol succinate (TOPROL-XL) 24 hr tablet 12.5 mg  12.5 mg Oral Daily Lance Coon, MD   12.5 mg at 04/03/19 1104  . ondansetron (ZOFRAN) tablet 4 mg  4 mg Oral Q6H PRN Lance Coon, MD       Or  . ondansetron [facility]) injection 4 mg  4 mg Intravenous Q6H PRN  Lance Coon, MD      . polyethylene glycol (MIRALAX / GLYCOLAX) packet 17 g  17 g Oral Daily PRN [name], MD      . sodium chloride flush (NS) 0.9 % injection 3 mL  3 mL Intravenous Q12H [name], MD   3 mL at 04/02/19 0749  . sodium chloride flush (NS) 0.9 % injection 3 mL  3 mL Intravenous PRN [name], MD   3 mL at 04/01/19 1815  . traMADol (ULTRAM) tablet 50 mg  50 mg Oral Q12H PRN Lance Coon, MD       Facility-Administered Medications Ordered in Other Encounters  Medication Dose Route Frequency Provider Last Rate Last Dose  . heparin lock flush 100 unit/mL  500 Units Intravenous Once Charlaine Dalton R, MD      . sodium chloride flush (NS) 0.9 % injection 10 mL  10 mL Intravenous PRN Cammie Sickle, MD         Discharge Medications: Please see discharge summary for a list of discharge medications.  Relevant Imaging Results:  Relevant Lab Results:   Additional Information [id number]  Su Hilt, RN

## 2019-04-03 NOTE — TOC Initial Note (Signed)
Transition of Care [facility]) - Initial/Assessment Note    Patient Details  Name: Emily [name] MRN: [id number] Date of Birth: [birth year]  [facility]) CM/SW Contact:    Su Hilt, RN Phone Number: 04/03/2019, 3:37 PM  Clinical Narrative:                 Due to the patient being confused spoke with Netta Cedars on the phone, he states that the patient has been living alone in [facility], he knows that she needs more skilled than that, she has been to [facility] and [facility] in the past and he is fine sending out bed requests for the patient.  Fl2 completed, Passr number obtained bed search sent, to review with the son upon getting a bed offer  Expected Discharge Plan: Skilled Nursing Facility Barriers to Discharge: Continued Medical Work up   Patient Goals and CMS Choice Patient states their goals for this hospitalization and ongoing recovery are:: son wants to go to rehab      Expected Discharge Plan and Services Expected Discharge Plan: [place]   Discharge Planning Services: CM Consult   Living arrangements for the past 2 months: [place]                                      Prior Living Arrangements/Services Living arrangements for the past 2 months: [place] Lives with:: Self Patient language and need for interpreter reviewed:: No Do you feel safe going back to the place where you live?: No      Need for Family Participation in Patient Care: Yes (Comment) Care giver support system in place?: Yes (comment)   Criminal Activity/Legal Involvement Pertinent to Current Situation/Hospitalization: No - Comment as needed  Activities of Daily Living Home Assistive Devices/Equipment: Reacher, Walker (specify type) ADL Screening (condition at time of admission) Patient's cognitive ability adequate to safely complete daily activities?: Yes Is the patient deaf or have difficulty  hearing?: No Does the patient have difficulty seeing, even when wearing glasses/contacts?: No Does the patient have difficulty concentrating, remembering, or making decisions?: No Patient able to express need for assistance with ADLs?: Yes Does the patient have difficulty dressing or bathing?: No Independently performs ADLs?: Yes (appropriate for developmental age) Does the patient have difficulty walking or climbing stairs?: Yes Weakness of Legs: Both Weakness of Arms/Hands: Both  Permission Sought/Granted Permission sought to share information with : Case Manager, Customer service manager Permission granted to share information with : Yes, Verbal Permission Granted              Emotional Assessment Appearance:: Appears stated age Attitude/Demeanor/Rapport: Engaged, Unable to Assess Affect (typically observed): Accepting, Unable to Assess Orientation: : Oriented to Self Alcohol / Substance Use: Never Used Psych Involvement: No (comment)  Admission diagnosis:  Lower urinary tract infection [N39.0] Patient Active Problem List   Diagnosis Date Noted  . Pressure injury of skin 04/01/2019  . UTI (urinary tract infection) 03/31/2019  . Acute on chronic renal failure ([place]) 03/31/2019  . 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 Systolic dysfunction 59/74/1638  . Malnutrition of moderate degree 07/21/2016  . Lower GI bleed 07/18/2016  . History of non-ST elevation myocardial infarction (NSTEMI) 06/21/2016  . Closed right hip fracture ([place]) 06/21/2016  . Ischemic chest pain ([place]) 06/21/2016  . 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 Cancer  of contiguous sites of hypopharynx ([place]) 12/23/2015  . History of colon cancer 05/09/2015  . Diverticulosis 05/09/2015  . Fatigue 05/09/2015  . Hemorrhoid 05/09/2015  . Hypertension 05/09/2015  . Insomnia 05/09/2015  . Osteopenia 05/09/2015  . Skin lesion of back 05/09/2015  . Tobacco abuse 05/09/2015  . Vitamin D deficiency  05/09/2015  . Acid reflux 06/13/2013  . Arthritis, degenerative 06/13/2013  . Disuse syndrome 06/13/2013  . Rectal cancer ([place]) 01/20/2013   PCP:  [name], MD Pharmacy:   [facility] [address] [place] [address] Phone: [phone] Fax: [phone]  [id number] - [place], [place] AT [place] [address] [place] [address] Phone: [phone] Fax: [phone]     Social Determinants of Health (SDOH) Interventions    Readmission Risk Interventions No flowsheet data found.

## 2019-04-03 NOTE — Progress Notes (Signed)
[place] at [place] NAME: Emily [name]    MR#:  [id number]  DATE OF BIRTH:  December 15, 1932  SUBJECTIVE:  CHIEF COMPLAINT:   Chief Complaint  Patient presents with  . Recurrent UTI   -Remains confused.  Poor oral intake.  Patient states she does not feel like eating anything.  REVIEW OF SYSTEMS:  Review of Systems  Constitutional: Positive for malaise/fatigue. Negative for chills and fever.  HENT: Negative for ear discharge, hearing loss and nosebleeds.   Eyes: Negative for blurred vision and double vision.  Respiratory: Negative for cough, shortness of breath and wheezing.   Cardiovascular: Negative for chest pain, palpitations and leg swelling.  Gastrointestinal: Negative for abdominal pain, constipation, diarrhea, nausea and vomiting.  Genitourinary: Negative for dysuria.  Musculoskeletal: Negative for myalgias.  Neurological: Negative for dizziness, focal weakness, seizures, weakness and headaches.  Psychiatric/Behavioral: Negative for depression.    DRUG ALLERGIES:   Allergies  Allergen Reactions  . Amlodipine Besylate Other (See Comments)    Patient unaware of any allergy with this medicine  . Oxycodone     confusion    VITALS:  Blood pressure (!) 121/58, pulse 72, temperature 98.3 F (36.8 C), temperature source Oral, resp. rate 16, height 5\' 5"  (1.651 m), weight 52.3 kg, SpO2 94 %.  PHYSICAL EXAMINATION:  Physical Exam   GENERAL:  85 y.o.-year-old elderly patient lying in the bed with no acute distress.  EYES: Pupils equal, round, reactive to light and accommodation. No scleral icterus. Extraocular muscles intact.  HEENT: Head atraumatic, normocephalic. Oropharynx and nasopharynx clear.  NECK:  Supple, no jugular venous distention. No thyroid enlargement, no tenderness.  LUNGS: Normal breath sounds bilaterally, no wheezing, rales,rhonchi or crepitation. No use of accessory muscles of respiration. Decreased basilar  breath sounds CARDIOVASCULAR: S1, S2 normal. No  rubs, or gallops.  2/6 systolic murmur is present ABDOMEN: Soft, nontender, nondistended. Bowel sounds present. No organomegaly or mass.  EXTREMITIES: No pedal edema, cyanosis, or clubbing.  NEUROLOGIC: Cranial nerves II through XII are intact. Muscle strength 5/5 in all extremities. Sensation intact. Gait not checked.  Global weakness noted PSYCHIATRIC: The patient is alert and oriented to self SKIN: No obvious rash, lesion, or ulcer.    LABORATORY PANEL:   CBC Recent Labs  Lab 04/01/19 0242  WBC 5.3  HGB 12.8  HCT 38.8  PLT 230   ------------------------------------------------------------------------------------------------------------------  Chemistries  Recent Labs  Lab 04/01/19 0242  NA 140  K 3.9  CL 107  CO2 26  GLUCOSE 127*  BUN 19  CREATININE 1.26*  CALCIUM 9.7   ------------------------------------------------------------------------------------------------------------------  Cardiac Enzymes Recent Labs  Lab 03/31/19 1530  TROPONINI <0.03   ------------------------------------------------------------------------------------------------------------------  RADIOLOGY:  Dg Esophagus W Single Cm (sol Or Thin Ba)  Result Date: 04/03/2019 CLINICAL DATA:  Dysphagia EXAM: ESOPHOGRAM / BARIUM SWALLOW / BARIUM TABLET STUDY TECHNIQUE: Combined double contrast and single contrast examination performed using effervescent crystals, thick barium liquid, and thin barium liquid. FLUOROSCOPY TIME:  Fluoroscopy Time:  36 seconds Radiation Exposure Index (if provided by the fluoroscopic device): 2.2 mGy Number of Acquired Spot Images: 0 COMPARISON:  None. FINDINGS: Limited evaluation secondary to limited mobility. There was normal pharyngeal anatomy and motility. Contrast flowed freely through the esophagus without evidence of stricture or mass. There was normal esophageal mucosa without evidence of irregularity or ulceration.  Esophageal motility was normal. No evidence of reflux. No definite hiatal hernia was demonstrated. Patient refused 13 mm barium tablet. Patient  stated that she does not routinely swallow large tablets and was afraid of getting choked. IMPRESSION: Normal barium swallow. Somewhat limited evaluation secondary to limited patient mobility. Electronically Signed   By: Kathreen Devoid   On: 04/03/2019 09:39    EKG:   Orders placed or performed during the hospital encounter of 03/31/19  . ED EKG  . ED EKG    ASSESSMENT AND PLAN:   85 year old female with past medical history significant for dementia, history of rectal cancer, dyspnea presents from [facility] secondary to weakness and noted to have UTI.  1.  Acute cystitis-urine cultures have been sent for.  Currently on Rocephin.  Stop tomorrow -Physical therapy consult requested  2.  Hypertension-on lisinopril and metoprolol  3.  Dementia-pleasantly confused, seems to be at baseline.  Continue Aricept -CT head with atrophy but no significant changes  4.  DVT prophylaxis-Lovenox  5.  Poor oral intake-started IV fluids as urine is darker.   -According to son and daughter-in-law, patient's intake has been extremely poor for a few weeks now.  She feels like food is stuck and does not want to eat. -Barium swallow study done, did not show any obstruction.  Likely failure to thrive secondary to progressive dementia.   Physical therapy consulted-they have recommended rehab at this point Updated son over the phone again today- confirmed DNR status  Palliative care consulted   All the records are reviewed and case discussed with Care Management/Social Workerr. Management plans discussed with the patient, family and they are in agreement.  CODE STATUS: DNR  TOTAL TIME TAKING CARE OF THIS PATIENT: 38 minutes.   POSSIBLE D/C IN 1-2 DAYS, DEPENDING ON CLINICAL CONDITION.   [name] M.D on 04/03/2019 at 2:46 PM  Between 7am to 6pm -  Pager - [phone]  After 6pm go to [email or web address] - password [email or web address] [place] [facility]  Office  [phone]  CC: Primary care physician; [name], MD

## 2019-04-04 DIAGNOSIS — Z85828 Personal history of other malignant neoplasm of skin: Secondary | ICD-10-CM | POA: Diagnosis not present

## 2019-04-04 DIAGNOSIS — Z7189 Other specified counseling: Secondary | ICD-10-CM | POA: Diagnosis not present

## 2019-04-04 DIAGNOSIS — N183 Chronic kidney disease, stage 3 (moderate): Secondary | ICD-10-CM | POA: Diagnosis not present

## 2019-04-04 DIAGNOSIS — N189 Chronic kidney disease, unspecified: Secondary | ICD-10-CM

## 2019-04-04 DIAGNOSIS — N390 Urinary tract infection, site not specified: Secondary | ICD-10-CM | POA: Diagnosis not present

## 2019-04-04 DIAGNOSIS — Z515 Encounter for palliative care: Secondary | ICD-10-CM | POA: Diagnosis not present

## 2019-04-04 DIAGNOSIS — Z66 Do not resuscitate: Secondary | ICD-10-CM

## 2019-04-04 DIAGNOSIS — M8580 Other specified disorders of bone density and structure, unspecified site: Secondary | ICD-10-CM | POA: Diagnosis not present

## 2019-04-04 DIAGNOSIS — Z96641 Presence of right artificial hip joint: Secondary | ICD-10-CM | POA: Diagnosis not present

## 2019-04-04 DIAGNOSIS — R109 Unspecified abdominal pain: Secondary | ICD-10-CM | POA: Diagnosis not present

## 2019-04-04 DIAGNOSIS — I5181 Takotsubo syndrome: Secondary | ICD-10-CM | POA: Diagnosis not present

## 2019-04-04 DIAGNOSIS — N179 Acute kidney failure, unspecified: Secondary | ICD-10-CM | POA: Diagnosis not present

## 2019-04-04 DIAGNOSIS — I129 Hypertensive chronic kidney disease with stage 1 through stage 4 chronic kidney disease, or unspecified chronic kidney disease: Secondary | ICD-10-CM | POA: Diagnosis not present

## 2019-04-04 DIAGNOSIS — R5381 Other malaise: Secondary | ICD-10-CM | POA: Diagnosis not present

## 2019-04-04 DIAGNOSIS — Z7401 Bed confinement status: Secondary | ICD-10-CM | POA: Diagnosis not present

## 2019-04-04 DIAGNOSIS — Z87891 Personal history of nicotine dependence: Secondary | ICD-10-CM | POA: Diagnosis not present

## 2019-04-04 DIAGNOSIS — I10 Essential (primary) hypertension: Secondary | ICD-10-CM | POA: Diagnosis not present

## 2019-04-04 DIAGNOSIS — Z85048 Personal history of other malignant neoplasm of rectum, rectosigmoid junction, and anus: Secondary | ICD-10-CM | POA: Diagnosis not present

## 2019-04-04 DIAGNOSIS — Z85818 Personal history of malignant neoplasm of other sites of lip, oral cavity, and pharynx: Secondary | ICD-10-CM | POA: Diagnosis not present

## 2019-04-04 DIAGNOSIS — E440 Moderate protein-calorie malnutrition: Secondary | ICD-10-CM | POA: Diagnosis not present

## 2019-04-04 DIAGNOSIS — K219 Gastro-esophageal reflux disease without esophagitis: Secondary | ICD-10-CM | POA: Diagnosis not present

## 2019-04-04 DIAGNOSIS — E559 Vitamin D deficiency, unspecified: Secondary | ICD-10-CM | POA: Diagnosis not present

## 2019-04-04 DIAGNOSIS — F0390 Unspecified dementia without behavioral disturbance: Secondary | ICD-10-CM | POA: Diagnosis not present

## 2019-04-04 DIAGNOSIS — Z20828 Contact with and (suspected) exposure to other viral communicable diseases: Secondary | ICD-10-CM | POA: Diagnosis not present

## 2019-04-04 DIAGNOSIS — Z85038 Personal history of other malignant neoplasm of large intestine: Secondary | ICD-10-CM | POA: Diagnosis not present

## 2019-04-04 DIAGNOSIS — R079 Chest pain, unspecified: Secondary | ICD-10-CM | POA: Diagnosis not present

## 2019-04-04 DIAGNOSIS — M2550 Pain in unspecified joint: Secondary | ICD-10-CM | POA: Diagnosis not present

## 2019-04-04 DIAGNOSIS — R1310 Dysphagia, unspecified: Secondary | ICD-10-CM | POA: Diagnosis not present

## 2019-04-04 DIAGNOSIS — G4700 Insomnia, unspecified: Secondary | ICD-10-CM | POA: Diagnosis not present

## 2019-04-04 DIAGNOSIS — G9340 Encephalopathy, unspecified: Secondary | ICD-10-CM | POA: Diagnosis not present

## 2019-04-04 DIAGNOSIS — I959 Hypotension, unspecified: Secondary | ICD-10-CM | POA: Diagnosis not present

## 2019-04-04 DIAGNOSIS — M1990 Unspecified osteoarthritis, unspecified site: Secondary | ICD-10-CM | POA: Diagnosis not present

## 2019-04-04 DIAGNOSIS — I252 Old myocardial infarction: Secondary | ICD-10-CM | POA: Diagnosis not present

## 2019-04-04 LAB — BASIC METABOLIC PANEL
Anion gap: 6 (ref 5–15)
BUN: 15 mg/dL (ref 8–23)
CO2: 23 mmol/L (ref 22–32)
Calcium: 9.4 mg/dL (ref 8.9–10.3)
Chloride: 108 mmol/L (ref 98–111)
Creatinine, Ser: 1 mg/dL (ref 0.44–1.00)
GFR calc Af Amer: >60 mL/min (ref >60)
GFR calc non Af Amer: 52 mL/min — ABNORMAL LOW (ref >60)
Glucose, Bld: 119 mg/dL — ABNORMAL HIGH (ref 70–99)
Potassium: 3.6 mmol/L (ref 3.5–5.1)
Sodium: 137 mmol/L (ref 135–145)

## 2019-04-04 MED ORDER — ENOXAPARIN SODIUM 40 MG/0.4ML ~~LOC~~ SOLN: 40.0000 mg | SUBCUTANEOUS | Status: DC

## 2019-04-04 MED ORDER — ENSURE ENLIVE PO LIQD: 237.0000 mL | Freq: Two times a day (BID) | ORAL | 12 refills | Status: DC

## 2019-04-04 NOTE — Progress Notes (Signed)
[place] at [place] NAME: Emily [name]    MR#:  [id number]  DATE OF BIRTH:  Mar 21, 1933  SUBJECTIVE:  CHIEF COMPLAINT:   Chief Complaint  Patient presents with  . Recurrent UTI   -Continues to have poor oral intake.  No complaints.  Awaiting rehab bed  REVIEW OF SYSTEMS:  Review of Systems  Constitutional: Positive for malaise/fatigue. Negative for chills and fever.  HENT: Negative for ear discharge, hearing loss and nosebleeds.   Eyes: Negative for blurred vision and double vision.  Respiratory: Negative for cough, shortness of breath and wheezing.   Cardiovascular: Negative for chest pain, palpitations and leg swelling.  Gastrointestinal: Negative for abdominal pain, constipation, diarrhea, nausea and vomiting.  Genitourinary: Negative for dysuria.  Musculoskeletal: Negative for myalgias.  Neurological: Negative for dizziness, focal weakness, seizures, weakness and headaches.  Psychiatric/Behavioral: Negative for depression.    DRUG ALLERGIES:   Allergies  Allergen Reactions  . Amlodipine Besylate Other (See Comments)    Patient unaware of any allergy with this medicine  . Oxycodone     confusion    VITALS:  Blood pressure (!) 113/54, pulse 94, temperature 98.2 F (36.8 C), temperature source Oral, resp. rate 16, height 5\' 5"  (1.651 m), weight 52.3 kg, SpO2 92 %.  PHYSICAL EXAMINATION:  Physical Exam   GENERAL:  85 y.o.-year-old elderly patient lying in the bed with no acute distress.  EYES: Pupils equal, round, reactive to light and accommodation. No scleral icterus. Extraocular muscles intact.  HEENT: Head atraumatic, normocephalic. Oropharynx and nasopharynx clear.  NECK:  Supple, no jugular venous distention. No thyroid enlargement, no tenderness.  LUNGS: Normal breath sounds bilaterally, no wheezing, rales,rhonchi or crepitation. No use of accessory muscles of respiration. Decreased basilar breath sounds  CARDIOVASCULAR: S1, S2 normal. No  rubs, or gallops.  2/6 systolic murmur is present ABDOMEN: Soft, nontender, nondistended. Bowel sounds present. No organomegaly or mass.  EXTREMITIES: No pedal edema, cyanosis, or clubbing.  NEUROLOGIC: Cranial nerves II through XII are intact. Muscle strength 5/5 in all extremities. Sensation intact. Gait not checked.  Global weakness noted PSYCHIATRIC: The patient is alert and oriented x 2 SKIN: No obvious rash, lesion, or ulcer.    LABORATORY PANEL:   CBC Recent Labs  Lab 04/01/19 0242  WBC 5.3  HGB 12.8  HCT 38.8  PLT 230   ------------------------------------------------------------------------------------------------------------------  Chemistries  Recent Labs  Lab 04/04/19 0354  NA 137  K 3.6  CL 108  CO2 23  GLUCOSE 119*  BUN 15  CREATININE 1.00  CALCIUM 9.4   ------------------------------------------------------------------------------------------------------------------  Cardiac Enzymes Recent Labs  Lab 03/31/19 1530  TROPONINI <0.03   ------------------------------------------------------------------------------------------------------------------  RADIOLOGY:  Dg Esophagus W Single Cm (sol Or Thin Ba)  Result Date: 04/03/2019 CLINICAL DATA:  Dysphagia EXAM: ESOPHOGRAM / BARIUM SWALLOW / BARIUM TABLET STUDY TECHNIQUE: Combined double contrast and single contrast examination performed using effervescent crystals, thick barium liquid, and thin barium liquid. FLUOROSCOPY TIME:  Fluoroscopy Time:  36 seconds Radiation Exposure Index (if provided by the fluoroscopic device): 2.2 mGy Number of Acquired Spot Images: 0 COMPARISON:  None. FINDINGS: Limited evaluation secondary to limited mobility. There was normal pharyngeal anatomy and motility. Contrast flowed freely through the esophagus without evidence of stricture or mass. There was normal esophageal mucosa without evidence of irregularity or ulceration. Esophageal motility was  normal. No evidence of reflux. No definite hiatal hernia was demonstrated. Patient refused 13 mm barium tablet. Patient stated that she  does not routinely swallow large tablets and was afraid of getting choked. IMPRESSION: Normal barium swallow. Somewhat limited evaluation secondary to limited patient mobility. Electronically Signed   By: Kathreen Devoid   On: 04/03/2019 09:39    EKG:   Orders placed or performed during the hospital encounter of 03/31/19  . ED EKG  . ED EKG    ASSESSMENT AND PLAN:   85 year old female with past medical history significant for dementia, history of rectal cancer, dyspnea presents from [facility] secondary to weakness and noted to have UTI.  1.  Acute cystitis-urine cultures have been negative.  Currently on Rocephin.  -Stop antibiotics today -Physical therapy consult requested  2.  Hypertension-on lisinopril and metoprolol  3.  Dementia-pleasantly confused, seems to be at baseline.  Continue Aricept -CT head with atrophy but no significant changes  4.  DVT prophylaxis-Lovenox  5.  Poor oral intake-started IV fluids here -According to son and daughter-in-law, patient's intake has been extremely poor for a few weeks now.  She feels like food is stuck and does not want to eat. -Barium swallow study done, did not show any obstruction.  Likely failure to thrive secondary to progressive dementia.   Physical therapy consulted-they have recommended rehab at this point Updated son over the phone again - confirmed DNR status  Palliative care consulted Possible discharge to rehab with palliative care following 1 day   All the records are reviewed and case discussed with Care Management/Social Workerr. Management plans discussed with the patient, family and they are in agreement.  CODE STATUS: DNR  TOTAL TIME TAKING CARE OF THIS PATIENT: 37 minutes.   POSSIBLE D/C IN 1-2 DAYS, DEPENDING ON CLINICAL CONDITION.   [name] M.D on 04/04/2019 at  12:58 PM  Between 7am to 6pm - Pager - [phone]  After 6pm go to [email or web address] - password [email or web address] [place] [facility]  Office  [phone]  CC: Primary care physician; [name], MD

## 2019-04-04 NOTE — Progress Notes (Signed)
Patient discharged per MD order. Report called to Levada Dy at facility. EMS called for transport.

## 2019-04-04 NOTE — TOC Transition Note (Signed)
Transition of Care [facility]) - CM/SW Discharge Note   Patient Details  Name: Emily [name] MRN: [id number] Date of Birth: Apr 28, 1933  Transition of Care [facility]) CM/SW Contact:  Su Hilt, RN Phone Number: 04/04/2019, 2:53 PM   Clinical Narrative:    Patient to DC to [facility] room 63, [name] her son made aware, Bedside nurse to call report to [phone] when ready to transport via EMS, Bedside nurse to call EMS,     Final next level of care: Skilled Nursing Facility Barriers to Discharge: Barriers Resolved   Patient Goals and CMS Choice Patient states their goals for this hospitalization and ongoing recovery are:: son wants to go to rehab      Discharge Placement                       Discharge Plan and Services   Discharge Planning Services: CM Consult                                 Social Determinants of Health (SDOH) Interventions     Readmission Risk Interventions No flowsheet data found.

## 2019-04-04 NOTE — Discharge Summary (Signed)
[place] [facility] [place] NAME: Emily [name]    MR#:  [id number]  DATE OF BIRTH:  November 06, 1932  DATE OF ADMISSION:  03/31/2019   ADMITTING PHYSICIAN: Lance Coon, MD  DATE OF DISCHARGE:  04/04/19  PRIMARY CARE PHYSICIAN: [name], MD   ADMISSION DIAGNOSIS:   Lower urinary tract infection [N39.0]  DISCHARGE DIAGNOSIS:   Principal Problem:   UTI (urinary tract infection) Active Problems:   Hypertension   Acid reflux   Acute on chronic renal failure (HCC)   Pressure injury of skin   SECONDARY DIAGNOSIS:   Past Medical History:  Diagnosis Date  . Arthritis    fingers  . Cancer ([place])    rectal  . Cancer ([place]) 2012   colon cancer  . Cancer of contiguous sites of hypopharynx ([place]) 12/23/2015  . Colon cancer ([place])   . Difficult intubation   . Diverticulosis   . Dyspnea    SOB  . Hemorrhoids   . History of chicken pox   . HOH (hard of hearing)   . Hypertension   . Osteopenia   . Status post chemotherapy    colon cancer  . Status post radiation therapy    colon cancer 2013    HOSPITAL COURSE:   85 year old female with past medical history significant for dementia, history of rectal cancer, dyspnea presents from [facility] secondary to weakness and noted to have UTI.  1.  Acute cystitis-urine cultures have been negative.   on Rocephin.  -finished antibiotics today -Physical therapy consult requested- needs rehab  2.  Hypertension-on lisinopril and metoprolol  3.  Dementia-pleasantly confused, seems to be at baseline.  Continue Aricept -CT head with atrophy but no significant changes  4.  Poor oral intake- received IV fluids here -According to son and daughter-in-law, patient's intake has been extremely poor for a few weeks now.  She feels like food is stuck and does not want to eat. -Barium swallow study done, did not show any obstruction.  Likely failure to thrive secondary to progressive dementia.    Physical therapy consulted-they have recommended rehab at this point  Palliative care consulted Possible discharge to rehab with palliative care  Today   DISCHARGE CONDITIONS:   Guarded  CONSULTS OBTAINED:   Palliative Care consultation  DRUG ALLERGIES:   Allergies  Allergen Reactions  . Amlodipine Besylate Other (See Comments)    Patient unaware of any allergy with this medicine  . Oxycodone     confusion   DISCHARGE MEDICATIONS:   Allergies as of 04/04/2019      Reactions   Amlodipine Besylate Other (See Comments)   Patient unaware of any allergy with this medicine   Oxycodone    confusion      Medication List    STOP taking these medications   sulfamethoxazole-trimethoprim 200-40 MG/5ML suspension Commonly known as:  BACTRIM     TAKE these medications   cyanocobalamin 1000 MCG tablet Take 1,000 mcg by mouth daily.   donepezil 5 MG tablet Commonly known as:  Aricept Take 1 tablet (5 mg total) by mouth at bedtime.   feeding supplement (ENSURE ENLIVE) Liqd Take 237 mLs by mouth 2 (two) times daily between meals.   lisinopril 10 MG tablet Commonly known as:  ZESTRIL One tablet daily What changed:    how much to take  how to take this  when to take this  additional instructions   metoprolol succinate 25 MG 24 hr  tablet Commonly known as:  TOPROL-XL Take 0.5 tablets (12.5 mg total) by mouth daily.   Vitamin D-3 25 MCG (1000 UT) Caps Take 1,000 Units by mouth daily.        DISCHARGE INSTRUCTIONS:   1. PCP f/u in 1-2 weeks 2. Palliative care  DIET:   Cardiac diet  ACTIVITY:   Activity as tolerated  OXYGEN:   Home Oxygen: No.  Oxygen Delivery: room air  DISCHARGE LOCATION:   nursing home   If you experience worsening of your admission symptoms, develop shortness of breath, life threatening emergency, suicidal or homicidal thoughts you must seek medical attention immediately by calling 911 or calling your MD immediately  if  symptoms less severe.  You Must read complete instructions/literature along with all the possible adverse reactions/side effects for all the Medicines you take and that have been prescribed to you. Take any new Medicines after you have completely understood and accpet all the possible adverse reactions/side effects.   Please note  You were cared for by a hospitalist during your hospital stay. If you have any questions about your discharge medications or the care you received while you were in the hospital after you are discharged, you can call the unit and asked to speak with the hospitalist on call if the hospitalist that took care of you is not available. Once you are discharged, your primary care physician will handle any further medical issues. Please note that NO REFILLS for any discharge medications will be authorized once you are discharged, as it is imperative that you return to your primary care physician (or establish a relationship with a primary care physician if you do not have one) for your aftercare needs so that they can reassess your need for medications and monitor your lab values.    On the day of Discharge:  VITAL SIGNS:   Blood pressure (!) 113/54, pulse 94, temperature 98.2 F (36.8 C), temperature source Oral, resp. rate 16, height 5\' 5"  (1.651 m), weight 52.3 kg, SpO2 92 %.  PHYSICAL EXAMINATION:      GENERAL:  85 y.o.-year-old elderly patient lying in the bed with no acute distress.  EYES: Pupils equal, round, reactive to light and accommodation. No scleral icterus. Extraocular muscles intact.  HEENT: Head atraumatic, normocephalic. Oropharynx and nasopharynx clear.  NECK:  Supple, no jugular venous distention. No thyroid enlargement, no tenderness.  LUNGS: Normal breath sounds bilaterally, no wheezing, rales,rhonchi or crepitation. No use of accessory muscles of respiration. Decreased basilar breath sounds CARDIOVASCULAR: S1, S2 normal. No  rubs, or gallops.  2/6  systolic murmur is present ABDOMEN: Soft, nontender, nondistended. Bowel sounds present. No organomegaly or mass.  EXTREMITIES: No pedal edema, cyanosis, or clubbing.  NEUROLOGIC: Cranial nerves II through XII are intact. Muscle strength 5/5 in all extremities. Sensation intact. Gait not checked.  Global weakness noted PSYCHIATRIC: The patient is alert and oriented x 2 SKIN: No obvious rash, lesion, or ulcer.    DATA REVIEW:   CBC Recent Labs  Lab 04/01/19 0242  WBC 5.3  HGB 12.8  HCT 38.8  PLT 230    Chemistries  Recent Labs  Lab 04/04/19 0354  NA 137  K 3.6  CL 108  CO2 23  GLUCOSE 119*  BUN 15  CREATININE 1.00  CALCIUM 9.4     Microbiology Results  Results for orders placed or performed during the hospital encounter of 03/31/19  Urine Culture     Status: None   Collection Time: 03/31/19  3:30 PM  Result Value Ref Range Status   Specimen Description   Final    URINE, CATHETERIZED Performed at [facility], [address] [place], [place] [address]    Special Requests   Final    NONE Performed at [facility], [address] [place], [place] [address]    Culture   Final    NO GROWTH Performed at [facility], [place] [address] [place], [place] [address]    Report Status 04/01/2019 FINAL  Final  SARS Coronavirus 2 [facility] order, Performed in [facility] hospital lab)     Status: None   Collection Time: 03/31/19  5:10 PM  Result Value Ref Range Status   SARS Coronavirus 2 NEGATIVE NEGATIVE Final    Comment: (NOTE) If result is NEGATIVE SARS-CoV-2 target nucleic acids are NOT DETECTED. The SARS-CoV-2 RNA is generally detectable in upper and lower  respiratory specimens during the acute phase of infection. The lowest  concentration of SARS-CoV-2 viral copies this assay can detect is 250  copies / mL. A negative result does not preclude SARS-CoV-2 infection  and should not be used as the sole basis for treatment or other  patient  management decisions.  A negative result may occur with  improper specimen collection / handling, submission of specimen other  than nasopharyngeal swab, presence of viral mutation(s) within the  areas targeted by this assay, and inadequate number of viral copies  (<250 copies / mL). A negative result must be combined with clinical  observations, patient history, and epidemiological information. If result is POSITIVE SARS-CoV-2 target nucleic acids are DETECTED. The SARS-CoV-2 RNA is generally detectable in upper and lower  respiratory specimens dur ing the acute phase of infection.  Positive  results are indicative of active infection with SARS-CoV-2.  Clinical  correlation with patient history and other diagnostic information is  necessary to determine patient infection status.  Positive results do  not rule out bacterial infection or co-infection with other viruses. If result is PRESUMPTIVE POSTIVE SARS-CoV-2 nucleic acids MAY BE PRESENT.   A presumptive positive result was obtained on the submitted specimen  and confirmed on repeat testing.  While 2019 novel coronavirus  (SARS-CoV-2) nucleic acids may be present in the submitted sample  additional confirmatory testing may be necessary for epidemiological  and / or clinical management purposes  to differentiate between  SARS-CoV-2 and other Sarbecovirus currently known to infect humans.  If clinically indicated additional testing with an alternate test  methodology [phone]) is advised. The SARS-CoV-2 RNA is generally  detectable in upper and lower respiratory sp ecimens during the acute  phase of infection. The expected result is Negative. Fact Sheet for Patients:  StrictlyIdeas.no Fact Sheet for Healthcare Providers: BankingDealers.co.za This test is not yet approved or cleared by the [place] FDA and has been authorized for detection and/or diagnosis of SARS-CoV-2 by FDA under  an Emergency Use Authorization (EUA).  This EUA will remain in effect (meaning this test can be used) for the duration of the COVID-19 declaration under Section 564(b)(1) of the Act, 21 U.S.C. section 360bbb-3(b)(1), unless the authorization is terminated or revoked sooner. Performed at [facility], [address], [place],  [address]     RADIOLOGY:  No results found.   Management plans discussed with the patient, family and they are in agreement.  CODE STATUS:     Code Status Orders  (From admission, onward)         Start  Ordered   04/02/19 1147  Do not attempt resuscitation (DNR)  Continuous    Question Answer Comment  In the event of cardiac or respiratory ARREST Do not call a "code blue"   In the event of cardiac or respiratory ARREST Do not perform Intubation, CPR, defibrillation or ACLS   In the event of cardiac or respiratory ARREST Use medication by any route, position, wound care, and other measures to relive pain and suffering. May use oxygen, suction and manual treatment of airway obstruction as needed for comfort.      04/02/19 1146        Code Status History    Date Active Date Inactive Code Status Order ID Comments User Context   04/01/2019 0026 04/02/2019 1146 Full Code [id number]  Lance Coon, MD Inpatient   07/19/2016 0123 07/21/2016 1905 Full Code [id number]  Harvie Bridge, DO Inpatient   07/19/2016 0031 07/19/2016 0123 DNR [id number]  Harvie Bridge, DO ED   06/24/2016 1607 06/26/2016 1649 DNR [id number]  Hessie Knows, MD Inpatient   06/21/2016 2029 06/22/2016 0900 DNR [id number]  Vaughan Basta, MD Inpatient    Advance Directive Documentation     Most Recent Value  Type of Advance Directive  Healthcare Power of [place], Living will  Pre-existing out of facility DNR order (yellow form or pink MOST form)  -  "MOST" Form in Place?  -      TOTAL TIME TAKING CARE OF THIS PATIENT: 38  minutes.    [name] M.D on  04/04/2019 at 1:13 PM  Between 7am to 6pm - Pager - [phone]  After 6pm go to [email or web address] - Proofreader  [employer] [facility] [facility]  Office  [phone]  CC: Primary care physician; [name], MD   Note: This dictation was prepared with Dragon dictation along with smaller phrase technology. Any transcriptional errors that result from this process are unintentional.

## 2019-04-04 NOTE — Consult Note (Signed)
Consultation Note Date: 04/04/2019   Patient Name: Emily [name]  DOB: [birth year]  MRN: [id number]  Age / Sex: 85 y.o., female   PCP: [name], MD Referring Physician: [name], MD   REASON FOR CONSULTATION:Establishing goals of care  Palliative Care consult requested for this 85 y.o. female with multiple medical problems including dementia, arthritis, colon cancer (2013) s/p radiation, hypertension, and diverticulosis. She presented with complaints of generalized weakness. It was reported that she was taking Bactrim for UTI. Patient was found to have a UTI during ED work-up. Patient remains confused. CT head showed no significant changes. Since admission she continues to have poor oral po intake.   Clinical Assessment and Goals of Care: I have reviewed medical records including lab results, imaging, Epic notes, and MAR, received report from the bedside RN, and assessed the patient. I met at the bedside with patient and spoke with her son [name] via phone to discuss diagnosis prognosis, [place], EOL wishes, disposition and options. Patient is awake and alert. Pleasantly confused. She was able to appropriately identify her name, date of birth, and location.   I introduced Palliative Medicine as specialized medical care for people living with serious illness. It focuses on providing relief from the symptoms and stress of a serious illness. The goal is to improve quality of life for both the patient and the family. Son verbalizes understanding and appreciation of our involvement.   Son shares that patient has resided at [facility] for some time. He has not seen her in a few weeks but is aware that her health seems to be declining. He reports she has several falls in the past and now having some increased signs of confusion. [name], also shares that patient's appetite has declined and she would often only take a few bites if any with complaints that food either does not taste the  same, no appetite, or that when she eats feelings of food being stuck. She is ambulatory with a walker at times.   We discussed Her current illness and what it means in the larger context of Her on-going co-morbidities. With specific discussions regarding her progressing dementia, recurrent UTI, decreased po intake, and overall functional decline. Natural disease trajectory and expectations at EOL were discussed. Son verbalized understanding of his mother's current illness and co-morbidities. He shares that he feels she is showing signs of decline but also contributes this to her UTI.   I discussed in details with [name] the trajectory of dementia, with focus on decreased functional state, dysphagia, risk of aspiration, worsening confusion, as well as incontinence episodes. He verbalized understanding.   He spent time sharing memories of his childhood and of his mother. Emotional support offered.   I attempted to elicit values and goals of care important to the patient.    The difference between aggressive medical intervention and comfort care was considered in light of the patient's goals of care.At this time [name], states he remains hopeful for some improvement or stability but will begin thinking and considering the worst. He expresses he is hopeful that the worst is "far in a distance". He does not want his mother to suffer, but he also wants to allow her all opportunities to thrive and will make decisions as they are necessary.   Advanced directives, concepts specific to code status, artifical feeding and hydration, and rehospitalization were considered and discussed. [name] expresses he is the Scientist, research (medical) for his mom. He confirms DNR/DNI wishes  as well as in the event she continues to have a poor appetite he does not think she would want any forms of artificial feeding.   Hospice and Palliative Care services outpatient were explained and offered. Son verbalized their understanding and  awareness of both palliative and hospice's goals and philosophy of care. He states he is not ready to consider hospice, and remains hopeful his mother will continue to show some signs of stability or improvement. He does request outpatient palliative support once she is at [facility].   Questions and concerns were addressed.  Son aware that a Hard Choices booklet will be left with patient belongings for review. [name] was encouraged to call with questions or concerns.  PMT will continue to support holistically.   SOCIAL HISTORY:     reports that she has quit smoking. Her smoking use included cigarettes. She has a 64.00 pack-year smoking history. She has never used smokeless tobacco. She reports current alcohol use. She reports that she does not use drugs.  CODE STATUS: DNR  ADVANCE DIRECTIVES: Primary Decision Maker: NEXT OF KIN (SON: [name] Brownrigg)  SYMPTOM MANAGEMENT: per attending   Palliative Prophylaxis:   Aspiration, Bowel Regimen, Delirium Protocol, Frequent Pain Assessment, Oral Care and Turn Reposition  PSYCHO-SOCIAL/SPIRITUAL:  Support System: Family   Desire for further Chaplaincy support:NO   Additional Recommendations (Limitations, Scope, Preferences):  Full Scope Treatment and No Artificial Feeding   PAST MEDICAL HISTORY: Past Medical History:  Diagnosis Date   Arthritis    fingers   Cancer ([place])    rectal   Cancer ([place]) 2012   colon cancer   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 Pat Patrick   BREAST CYST ASPIRATION Bilateral    negative   BREAST SURGERY     cyst removal   BREAST SURGERY  1960   Breast Biopsy   CARDIAC CATHETERIZATION N/A 06/23/2016   Procedure: Left Heart Cath and Coronary Angiography;  Surgeon: Corey Skains, MD;  Location: [facility];  Service: Cardiovascular;  Laterality: N/A;   CATARACT EXTRACTION W/PHACO Left 06/01/2017   Procedure: CATARACT EXTRACTION PHACO AND INTRAOCULAR LENS PLACEMENT (IOC);  Surgeon: Birder Robson, MD;  Location: [facility];  Service: Ophthalmology;  Laterality: Left;  [place] 00:55 AP% 19.4 CDE 10.71 Fluid pack lot [id number]   CATARACT EXTRACTION W/PHACO Right 07/06/2017   Procedure: CATARACT EXTRACTION PHACO AND INTRAOCULAR LENS PLACEMENT ([place]);  Surgeon: Birder Robson, MD;  Location: [facility];  Service: Ophthalmology;  Laterality: Right;  [place]  00:44.7 AP% 18.2 CDE 8.10 Fluid Pack lot # [id number]   COLON SURGERY  06/28/2013   [facility] tumor [facility] colon; [facility]   COLONOSCOPY     COLONOSCOPY N/A 10/01/2015   Procedure: COLONOSCOPY;  Surgeon: [name], MD;  Location: [facility];  Service: Endoscopy;  Laterality:  N/A;   DILATION AND CURETTAGE OF UTERUS  1957   DIRECT LARYNGOSCOPY N/A 12/11/2015   Procedure: DIRECT LARYNGOSCOPY;  Surgeon: Clyde Canterbury, MD;  Location: [facility];  Service: ENT;  Laterality: N/A;   ESOPHAGOSCOPY N/A 12/11/2015   Procedure: ESOPHAGOSCOPY;  Surgeon: Clyde Canterbury, MD;  Location: [facility];  Service: ENT;  Laterality: N/A;   EUS N/A 01/20/2013   Procedure: LOWER ENDOSCOPIC ULTRASOUND (EUS);  Surgeon: Milus Banister, MD;  Location: [facility];  Service: Endoscopy;  Laterality: N/A;   JOINT REPLACEMENT     PEG PLACEMENT N/A 01/01/2016   Procedure: PERCUTANEOUS ENDOSCOPIC GASTROSTOMY (PEG) PLACEMENT;  Surgeon: [name], MD;  Location: [facility];  Service: General;  Laterality: N/A;    PORTACATH PLACEMENT Right 01/01/2016   Procedure: INSERTION PORT-A-CATH;  Surgeon: [name], MD;  Location: [facility];  Service: General;  Laterality: Right;   TONSILLECTOMY     TOTAL HIP ARTHROPLASTY Right 06/24/2016   Procedure: TOTAL HIP ARTHROPLASTY ANTERIOR APPROACH;  Surgeon: Hessie Knows, MD;  Location: [facility];  Service: Orthopedics;  Laterality: Right;    ALLERGIES:  is allergic to amlodipine besylate and oxycodone.   MEDICATIONS:  Current Facility-Administered Medications  Medication Dose Route Frequency Provider Last Rate Last Dose   0.9 %  sodium chloride infusion   Intravenous PRN [name], MD   Stopped at 04/03/19 1904   acetaminophen (TYLENOL) tablet 650 mg  650 mg Oral Q6H PRN Lance Coon, MD   650 mg at 04/04/19 0231   Or   acetaminophen (TYLENOL) suppository 650 mg  650 mg Rectal Q6H PRN Lance Coon, MD       cefTRIAXone (ROCEPHIN) 1 g in sodium chloride 0.9 % 100 mL IVPB  1 g Intravenous Q24H Lance Coon, MD   Stopped at 04/03/19 1927   donepezil (ARICEPT) tablet 5 mg  5 mg Oral QHS [name], MD   5 mg at 04/03/19 2130   enoxaparin (LOVENOX) injection 40 mg  40 mg Subcutaneous Q24H Shanlever, Charles M, RPH       feeding supplement (ENSURE ENLIVE) (ENSURE ENLIVE) liquid 237 mL  237 mL Oral BID BM [name], MD   237 mL at 04/04/19 1119   lisinopril (ZESTRIL) tablet 10 mg  10 mg Oral Daily Lance Coon, MD   10 mg at 04/04/19 1118   metoprolol succinate (TOPROL-XL) 24 hr tablet 12.5 mg  12.5 mg Oral Daily Lance Coon, MD   12.5 mg at 04/04/19 1119   ondansetron (ZOFRAN) tablet 4 mg  4 mg Oral Q6H PRN Lance Coon, MD       Or   ondansetron [facility]) injection 4 mg  4 mg Intravenous Q6H PRN Lance Coon, MD   4 mg at 04/04/19 0010   polyethylene glycol (MIRALAX / GLYCOLAX) packet 17 g  17 g Oral Daily PRN [name], MD       sodium chloride flush (NS) 0.9 % injection 3 mL  3 mL Intravenous Q12H [name], MD   3 mL at 04/04/19 1119   sodium chloride flush (NS) 0.9 % injection 3 mL  3 mL Intravenous PRN [name], MD   3 mL at 04/01/19 1815   traMADol (ULTRAM) tablet 50 mg  50 mg Oral Q12H PRN Lance Coon, MD       Facility-Administered Medications Ordered in Other Encounters  Medication Dose Route Frequency Provider Last Rate Last Dose   heparin lock flush 100 unit/mL  500 Units Intravenous Once Mascotte,  Elisha Headland, MD       sodium chloride flush (NS) 0.9 % injection 10 mL  10 mL Intravenous PRN Cammie Sickle, MD        VITAL SIGNS: BP (!) 113/54 (BP Location: Right Arm)    Pulse 94    Temp 98.2 F (36.8 C) (Oral)    Resp 16    Ht '5\' 5"'$  (1.651 m)    Wt 52.3 kg    SpO2 92%    BMI 19.17 kg/m  Filed Weights   03/31/19 1459 04/01/19 0023  Weight: 56.2 kg 52.3 kg    Estimated body mass index is 19.17 kg/m as calculated from the following:   Height as of this encounter: '5\' 5"'$  (1.651 m).   Weight as of this encounter: 52.3 kg.  LABS: CBC:    Component Value Date/Time   WBC 5.3 04/01/2019 0242   HGB 12.8 04/01/2019 0242   HGB 12.5 03/16/2018 1152   HCT 38.8 04/01/2019 0242   HCT 37.7 03/16/2018 1152   PLT 230 04/01/2019 0242   PLT 208 03/16/2018 1152   Comprehensive Metabolic Panel:    Component Value Date/Time   NA 137 04/04/2019 0354   NA 144 03/16/2018 1152   NA 143 11/19/2014 1440   K 3.6 04/04/2019 0354   K 4.1 11/19/2014 1440   CO2 23 04/04/2019 0354   CO2 30 11/19/2014 1440   BUN 15 04/04/2019 0354   BUN 24 03/16/2018 1152   BUN 24 (H) 11/19/2014 1440   CREATININE 1.00 04/04/2019 0354   CREATININE 1.29 11/19/2014 1440   ALBUMIN 4.1 03/16/2018 1152   ALBUMIN 3.6 11/19/2014 1440     Review of Systems  Constitutional: Positive for fatigue.  Neurological: Positive for weakness.  All other systems reviewed and are negative.  Physical Exam General: NAD, thin, chronically-ill appearing Cardiovascular: regular rate and  rhythm Pulmonary: clear ant fields, diminished bases Abdomen: soft, nontender, + bowel sounds Extremities: no edema, no joint deformities Skin: no rashes Neurological: Weakness but otherwise nonfocal   Prognosis: Guarded to poor in the setting of progressive dementia, poor po intake, acute cystitis, and generalized weakness.  Discharge Planning:  [facility] for rehab with Palliative care service follow-up  Recommendations:  DNR/DNI-as confirmed by son, [name]  Continue with current medical management  Son remains hopeful for improvement/stability but also beginning to prepare for the worst (further decline and dementia progression).  Outpatient palliative support at facility (referral placed)  PMT will continue to support and follow as needed.    Palliative Performance Scale: PPS 30%              Son, [name] expressed understanding and was in agreement with this plan.   The above conversation was completed via telephone due to the visitor restrictions during the COVID-19 pandemic. Thorough chart review and discussion with necessary members of the care team was completed as part of assessment.   Thank you for allowing the Palliative Medicine Team to assist in the care of this patient.  Time In: 1030 Time Out: 1145 Time Total: 75 min.   Visit consisted of counseling and education dealing with the complex and emotionally intense issues of symptom management and palliative care in the setting of serious and potentially life-threatening illness.Greater than 50%  of this time was spent counseling and coordinating care related to the above assessment and plan.  Signed by:  Alda Lea, AGPCNP-BC Palliative Medicine Team  Phone: [phone] Fax: [phone] Pager: [phone] Amion: N.  Cousar

## 2019-04-04 NOTE — Progress Notes (Signed)
PHARMACIST - PHYSICIAN COMMUNICATION  CONCERNING:  Enoxaparin (Lovenox) for DVT Prophylaxis    RECOMMENDATION: Patient was prescribed enoxaprin 40mg  q24 hours for VTE prophylaxis.   Filed Weights   03/31/19 1459 04/01/19 0023  Weight: 124 lb (56.2 kg) 115 lb 3.2 oz (52.3 kg)    Body mass index is 19.17 kg/m.  Estimated Creatinine Clearance: 35.2 mL/min (by C-G formula based on SCr of 1 mg/dL).  Patient is candidate for enoxaparin 40mg  every 24 hours based on CrCl >52ml/min AND Weight > then 45kg for female   DESCRIPTION: Pharmacy has adjusted enoxaparin dose per [facility] policy.  Patient is now receiving enoxaparin 40mg  every 24 hours.  Lu Duffel, PharmD, BCPS Clinical Pharmacist 04/04/2019 12:38 PM

## 2019-04-04 NOTE — TOC Progression Note (Signed)
Transition of Care [facility]) - Progression Note    Patient Details  Name: ALESA ECHEVARRIA MRN: [id number] Date of Birth: [birth year]  Transition of Care [facility]) CM/SW Contact  Su Hilt, RN Phone Number: 04/04/2019, 2:15 PM  Clinical Narrative:    Barbette Or at [facility] to obtain a room number, she stated that she will call back in about 15 min   Expected Discharge Plan: [place] Barriers to Discharge: Continued Medical Work up  Expected Discharge Plan and Services Expected Discharge Plan: [place]   Discharge Planning Services: CM Consult   Living arrangements for the past 2 months: [place] Expected Discharge Date: 04/04/19                                     Social Determinants of Health (SDOH) Interventions    Readmission Risk Interventions No flowsheet data found.

## 2019-04-05 ENCOUNTER — Telehealth: Payer: Self-pay

## 2019-04-05 DIAGNOSIS — G9340 Encephalopathy, unspecified: Secondary | ICD-10-CM | POA: Diagnosis not present

## 2019-04-05 DIAGNOSIS — R5381 Other malaise: Secondary | ICD-10-CM | POA: Diagnosis not present

## 2019-04-05 DIAGNOSIS — N183 Chronic kidney disease, stage 3 (moderate): Secondary | ICD-10-CM | POA: Diagnosis not present

## 2019-04-05 DIAGNOSIS — F0390 Unspecified dementia without behavioral disturbance: Secondary | ICD-10-CM | POA: Diagnosis not present

## 2019-04-05 NOTE — Telephone Encounter (Signed)
Called to complete a TCM and spoke with daughter and she states pt is currently at [facility] and they are calling hospice in for pallative care and PT. Pt has to be quarantined for 2 weeks at [facility]. Unable to complete TCM. FYI to PCP! -MM

## 2019-04-05 NOTE — Telephone Encounter (Signed)
No HFU scheduled.  

## 2019-04-14 ENCOUNTER — Telehealth: Payer: Self-pay

## 2019-04-14 ENCOUNTER — Other Ambulatory Visit: Payer: Self-pay

## 2019-04-14 ENCOUNTER — Other Ambulatory Visit: Payer: Medicare Other

## 2019-04-14 DIAGNOSIS — Z515 Encounter for palliative care: Secondary | ICD-10-CM

## 2019-04-14 NOTE — Progress Notes (Signed)
PATIENT NAME: Emily [name] DOB: Aug 21, 1933 MRN: [id number]  PRIMARY CARE PROVIDER: [name], MD  RESPONSIBLE PARTY:  Acct ID - Guarantor Home Phone Work Phone Relationship Acct Type  [id number] Emily [name], Emily [name]  Self P/F     [address], [place], [place] [address]    PLAN OF CARE and INTERVENTIONS:               1.  GOALS OF CARE/ ADVANCE CARE PLANNING:  Son Emily [name] would like patient to go to assisted living facility after discharge.               2.  PATIENT/CAREGIVER EDUCATION:  Education on palliative care services, education to facility staff on fall precautions, education on s/s of infection, fever                4. PERSONAL EMERGENCY PLAN: Patient currently at [facility] for rehab.                5.  DISEASE STATUS:Patient is a 85 year old patient who is currently at [facility] receiving rehab after hospitalization the end of May s/p UTI.  Patient ws residing a [place] with her husband.  Patient and son Emily [name] who is patient's HCPOA contacted by social earlier this week and Emily [name] gave consent for patient to be admitted to Palliative Care services.  Due to the COVID-19 crisis, this visit was done via telemedicine from my office and it was initiated and consent by this patient and or family. RN contacted SW Milana Kidney at facility and scheduled Telehealth visit.  Patient lying in bed on her right side. Patient denies pain but reports she feels tired.  Patient is having worsening memory issues.  Patient able to ambulate with walker and assist per SW.  SW states patient needs cueing on how to use walker.  Patient requires moderate to max assist with tolieting per SW.  Patient's current weight was 128 pounds per son Emily [name].  Patient has gained 4 pounds since being at facility.  SW reports patient's appetite is poor.  Patient being given magic cups with meals to increase caloric intake.  SW reported vital signs to RN from nurses AM assessment.  Patient having issues with  constipation.  Patient being given meds to treat constipation.  Patient has felt nauseated but has not had any vomiting.  Patient has not suffered any falls while being at facility. Patient is alert to self and familiars and is not currently taking any pain meds.  [name] Emily [name] states plan is for patient to discharge to Assisted living facility after patient completes rehab.  SW Jonelle Sidle states she has been filling out FL2's for patient.  RN called Emily [name] and updated him on patient's status.  Son and facility encouraged to call with questions or concerns.  Palliative Care services were explained and Palliative Care number was given to son and facility staff.                      HISTORY OF PRESENT ILLNESS:    CODE STATUS: DNR  ADVANCED DIRECTIVE MOST FORM: No PPS:40%   PHYSICAL EXAM:   VITALS: Vital signs taken by facility staff and reported to this RN LUNGS: Shortness of breath on exertion CARDIAC:  EXTREMITIES: none edema SKIN: Skin color, texture, turgor normal. No rashes or lesions  NEURO: positive for gait problems, memory problems and weakness       Nilda Simmer, RN

## 2019-04-14 NOTE — Telephone Encounter (Signed)
Telephone call to facility, spoke with SW Milana Kidney.  SW obtained consent from patient's son for permission to schedule palliative visit.  Tiffany in agreement with visit today 04/14/19 at 3:00 PM.

## 2019-04-17 ENCOUNTER — Telehealth: Payer: Self-pay | Admitting: *Deleted

## 2019-04-17 NOTE — Telephone Encounter (Signed)
I was able to call and speak with the patient's daughter in law today about colonoscopy.  She states patient is at [facility] and they do not know how long patient will be there. Originally, son stated that they were on lock down due to COVID-19. Daughter in law states this isn't a good time right now to have the colonoscopy is requesting that they call to arrange colonoscopy when convenient.  *Patient will need to have rapid COVID testing done morning of procedure if she is still at the facility at time of reschedule.

## 2019-04-17 NOTE — Telephone Encounter (Signed)
Patient in October 2020 recalls.

## 2019-04-30 DIAGNOSIS — R112 Nausea with vomiting, unspecified: Secondary | ICD-10-CM | POA: Diagnosis not present

## 2019-05-01 ENCOUNTER — Inpatient Hospital Stay
Admission: EM | Admit: 2019-05-01 | Discharge: 2019-06-03 | DRG: 683 | Disposition: E | Payer: Medicare Other | Attending: Internal Medicine | Admitting: Internal Medicine

## 2019-05-01 ENCOUNTER — Telehealth: Payer: Self-pay

## 2019-05-01 ENCOUNTER — Other Ambulatory Visit: Payer: Self-pay

## 2019-05-01 DIAGNOSIS — Z9841 Cataract extraction status, right eye: Secondary | ICD-10-CM

## 2019-05-01 DIAGNOSIS — E871 Hypo-osmolality and hyponatremia: Secondary | ICD-10-CM | POA: Diagnosis present

## 2019-05-01 DIAGNOSIS — N281 Cyst of kidney, acquired: Secondary | ICD-10-CM | POA: Diagnosis not present

## 2019-05-01 DIAGNOSIS — F0390 Unspecified dementia without behavioral disturbance: Secondary | ICD-10-CM | POA: Diagnosis present

## 2019-05-01 DIAGNOSIS — R140 Abdominal distension (gaseous): Secondary | ICD-10-CM | POA: Diagnosis not present

## 2019-05-01 DIAGNOSIS — M1990 Unspecified osteoarthritis, unspecified site: Secondary | ICD-10-CM | POA: Diagnosis present

## 2019-05-01 DIAGNOSIS — K2970 Gastritis, unspecified, without bleeding: Secondary | ICD-10-CM | POA: Diagnosis not present

## 2019-05-01 DIAGNOSIS — K219 Gastro-esophageal reflux disease without esophagitis: Secondary | ICD-10-CM | POA: Diagnosis not present

## 2019-05-01 DIAGNOSIS — E8809 Other disorders of plasma-protein metabolism, not elsewhere classified: Secondary | ICD-10-CM | POA: Diagnosis present

## 2019-05-01 DIAGNOSIS — I959 Hypotension, unspecified: Secondary | ICD-10-CM | POA: Diagnosis not present

## 2019-05-01 DIAGNOSIS — R059 Cough, unspecified: Secondary | ICD-10-CM

## 2019-05-01 DIAGNOSIS — Z03818 Encounter for observation for suspected exposure to other biological agents ruled out: Secondary | ICD-10-CM | POA: Diagnosis not present

## 2019-05-01 DIAGNOSIS — Z515 Encounter for palliative care: Secondary | ICD-10-CM | POA: Diagnosis present

## 2019-05-01 DIAGNOSIS — Z96641 Presence of right artificial hip joint: Secondary | ICD-10-CM | POA: Diagnosis present

## 2019-05-01 DIAGNOSIS — M8580 Other specified disorders of bone density and structure, unspecified site: Secondary | ICD-10-CM | POA: Diagnosis present

## 2019-05-01 DIAGNOSIS — N179 Acute kidney failure, unspecified: Principal | ICD-10-CM | POA: Diagnosis present

## 2019-05-01 DIAGNOSIS — Z79899 Other long term (current) drug therapy: Secondary | ICD-10-CM

## 2019-05-01 DIAGNOSIS — Z7189 Other specified counseling: Secondary | ICD-10-CM | POA: Diagnosis not present

## 2019-05-01 DIAGNOSIS — R809 Proteinuria, unspecified: Secondary | ICD-10-CM | POA: Diagnosis present

## 2019-05-01 DIAGNOSIS — K7460 Unspecified cirrhosis of liver: Secondary | ICD-10-CM | POA: Diagnosis present

## 2019-05-01 DIAGNOSIS — Z841 Family history of disorders of kidney and ureter: Secondary | ICD-10-CM

## 2019-05-01 DIAGNOSIS — K766 Portal hypertension: Secondary | ICD-10-CM | POA: Diagnosis present

## 2019-05-01 DIAGNOSIS — R0902 Hypoxemia: Secondary | ICD-10-CM | POA: Diagnosis not present

## 2019-05-01 DIAGNOSIS — Z923 Personal history of irradiation: Secondary | ICD-10-CM

## 2019-05-01 DIAGNOSIS — Z1159 Encounter for screening for other viral diseases: Secondary | ICD-10-CM

## 2019-05-01 DIAGNOSIS — R05 Cough: Secondary | ICD-10-CM

## 2019-05-01 DIAGNOSIS — K210 Gastro-esophageal reflux disease with esophagitis: Secondary | ICD-10-CM | POA: Diagnosis present

## 2019-05-01 DIAGNOSIS — Z8589 Personal history of malignant neoplasm of other organs and systems: Secondary | ICD-10-CM | POA: Diagnosis not present

## 2019-05-01 DIAGNOSIS — Z8619 Personal history of other infectious and parasitic diseases: Secondary | ICD-10-CM

## 2019-05-01 DIAGNOSIS — N183 Chronic kidney disease, stage 3 (moderate): Secondary | ICD-10-CM | POA: Diagnosis present

## 2019-05-01 DIAGNOSIS — D649 Anemia, unspecified: Secondary | ICD-10-CM | POA: Diagnosis not present

## 2019-05-01 DIAGNOSIS — Z9221 Personal history of antineoplastic chemotherapy: Secondary | ICD-10-CM

## 2019-05-01 DIAGNOSIS — R339 Retention of urine, unspecified: Secondary | ICD-10-CM | POA: Diagnosis not present

## 2019-05-01 DIAGNOSIS — K3189 Other diseases of stomach and duodenum: Secondary | ICD-10-CM | POA: Diagnosis present

## 2019-05-01 DIAGNOSIS — Z90710 Acquired absence of both cervix and uterus: Secondary | ICD-10-CM

## 2019-05-01 DIAGNOSIS — I129 Hypertensive chronic kidney disease with stage 1 through stage 4 chronic kidney disease, or unspecified chronic kidney disease: Secondary | ICD-10-CM | POA: Diagnosis present

## 2019-05-01 DIAGNOSIS — Z885 Allergy status to narcotic agent status: Secondary | ICD-10-CM

## 2019-05-01 DIAGNOSIS — Z85038 Personal history of other malignant neoplasm of large intestine: Secondary | ICD-10-CM | POA: Diagnosis not present

## 2019-05-01 DIAGNOSIS — N189 Chronic kidney disease, unspecified: Secondary | ICD-10-CM | POA: Diagnosis not present

## 2019-05-01 DIAGNOSIS — Z85048 Personal history of other malignant neoplasm of rectum, rectosigmoid junction, and anus: Secondary | ICD-10-CM

## 2019-05-01 DIAGNOSIS — I864 Gastric varices: Secondary | ICD-10-CM | POA: Diagnosis not present

## 2019-05-01 DIAGNOSIS — R944 Abnormal results of kidney function studies: Secondary | ICD-10-CM | POA: Diagnosis not present

## 2019-05-01 DIAGNOSIS — R109 Unspecified abdominal pain: Secondary | ICD-10-CM | POA: Diagnosis not present

## 2019-05-01 DIAGNOSIS — I10 Essential (primary) hypertension: Secondary | ICD-10-CM | POA: Diagnosis not present

## 2019-05-01 DIAGNOSIS — D6959 Other secondary thrombocytopenia: Secondary | ICD-10-CM | POA: Diagnosis present

## 2019-05-01 DIAGNOSIS — E860 Dehydration: Secondary | ICD-10-CM | POA: Diagnosis present

## 2019-05-01 DIAGNOSIS — H9190 Unspecified hearing loss, unspecified ear: Secondary | ICD-10-CM | POA: Diagnosis present

## 2019-05-01 DIAGNOSIS — N390 Urinary tract infection, site not specified: Secondary | ICD-10-CM | POA: Diagnosis not present

## 2019-05-01 DIAGNOSIS — R1084 Generalized abdominal pain: Secondary | ICD-10-CM | POA: Diagnosis not present

## 2019-05-01 DIAGNOSIS — Z66 Do not resuscitate: Secondary | ICD-10-CM | POA: Diagnosis present

## 2019-05-01 DIAGNOSIS — K8080 Other cholelithiasis without obstruction: Secondary | ICD-10-CM | POA: Diagnosis not present

## 2019-05-01 DIAGNOSIS — R4702 Dysphasia: Secondary | ICD-10-CM

## 2019-05-01 DIAGNOSIS — Z961 Presence of intraocular lens: Secondary | ICD-10-CM | POA: Diagnosis present

## 2019-05-01 DIAGNOSIS — G9340 Encephalopathy, unspecified: Secondary | ICD-10-CM | POA: Diagnosis not present

## 2019-05-01 DIAGNOSIS — R1310 Dysphagia, unspecified: Secondary | ICD-10-CM | POA: Diagnosis present

## 2019-05-01 DIAGNOSIS — Z87891 Personal history of nicotine dependence: Secondary | ICD-10-CM

## 2019-05-01 DIAGNOSIS — R188 Other ascites: Secondary | ICD-10-CM | POA: Diagnosis present

## 2019-05-01 DIAGNOSIS — K319 Disease of stomach and duodenum, unspecified: Secondary | ICD-10-CM | POA: Diagnosis not present

## 2019-05-01 DIAGNOSIS — R220 Localized swelling, mass and lump, head: Secondary | ICD-10-CM | POA: Diagnosis not present

## 2019-05-01 DIAGNOSIS — I700 Atherosclerosis of aorta: Secondary | ICD-10-CM | POA: Diagnosis not present

## 2019-05-01 DIAGNOSIS — Z136 Encounter for screening for cardiovascular disorders: Secondary | ICD-10-CM | POA: Diagnosis not present

## 2019-05-01 DIAGNOSIS — K209 Esophagitis, unspecified: Secondary | ICD-10-CM | POA: Diagnosis not present

## 2019-05-01 DIAGNOSIS — Z9842 Cataract extraction status, left eye: Secondary | ICD-10-CM

## 2019-05-01 DIAGNOSIS — Z888 Allergy status to other drugs, medicaments and biological substances status: Secondary | ICD-10-CM

## 2019-05-01 DIAGNOSIS — Z85818 Personal history of malignant neoplasm of other sites of lip, oral cavity, and pharynx: Secondary | ICD-10-CM

## 2019-05-01 DIAGNOSIS — J90 Pleural effusion, not elsewhere classified: Secondary | ICD-10-CM | POA: Diagnosis not present

## 2019-05-01 LAB — CBC
HCT: 42.5 % (ref 36.0–46.0)
Hemoglobin: 14.1 g/dL (ref 12.0–15.0)
MCH: 29.5 pg (ref 26.0–34.0)
MCHC: 33.2 g/dL (ref 30.0–36.0)
MCV: 88.9 fL (ref 80.0–100.0)
Platelets: 152 10*3/uL (ref 150–400)
RBC: 4.78 MIL/uL (ref 3.87–5.11)
RDW: 14.6 % (ref 11.5–15.5)
WBC: 10.4 10*3/uL (ref 4.0–10.5)
nRBC: 0 % (ref 0.0–0.2)

## 2019-05-01 LAB — COMPREHENSIVE METABOLIC PANEL
ALT: 16 U/L (ref 0–44)
AST: 28 U/L (ref 15–41)
Albumin: 2.7 g/dL — ABNORMAL LOW (ref 3.5–5.0)
Alkaline Phosphatase: 607 U/L — ABNORMAL HIGH (ref 38–126)
Anion gap: 12 (ref 5–15)
BUN: 74 mg/dL — ABNORMAL HIGH (ref 8–23)
CO2: 25 mmol/L (ref 22–32)
Calcium: 8.7 mg/dL — ABNORMAL LOW (ref 8.9–10.3)
Chloride: 97 mmol/L — ABNORMAL LOW (ref 98–111)
Creatinine, Ser: 3.59 mg/dL — ABNORMAL HIGH (ref 0.44–1.00)
GFR calc Af Amer: 13 mL/min — ABNORMAL LOW (ref >60)
GFR calc non Af Amer: 11 mL/min — ABNORMAL LOW (ref >60)
Glucose, Bld: 94 mg/dL (ref 70–99)
Potassium: 4.7 mmol/L (ref 3.5–5.1)
Sodium: 134 mmol/L — ABNORMAL LOW (ref 135–145)
Total Bilirubin: 0.6 mg/dL (ref 0.3–1.2)
Total Protein: 5.9 g/dL — ABNORMAL LOW (ref 6.5–8.1)

## 2019-05-01 LAB — SARS CORONAVIRUS 2 BY RT PCR (HOSPITAL ORDER, PERFORMED IN ~~LOC~~ HOSPITAL LAB): SARS Coronavirus 2: NEGATIVE

## 2019-05-01 MED ORDER — ALBUTEROL SULFATE (2.5 MG/3ML) 0.083% IN NEBU: 2.5000 mg | INHALATION_SOLUTION | RESPIRATORY_TRACT | Status: DC | PRN

## 2019-05-01 MED ORDER — VITAMIN B-12 1000 MCG PO TABS
1000.0000 ug | ORAL_TABLET | Freq: Every day | ORAL | Status: DC
  Administered 2019-05-02 – 2019-05-06 (×3): 1000 ug via ORAL
  Filled 2019-05-01 (×6): qty 1

## 2019-05-01 MED ORDER — SODIUM CHLORIDE 0.9 % IV BOLUS
1000.0000 mL | Freq: Once | INTRAVENOUS | Status: AC
  Administered 2019-05-01: 1000 mL via INTRAVENOUS

## 2019-05-01 MED ORDER — BISACODYL 5 MG PO TBEC: 5.0000 mg | DELAYED_RELEASE_TABLET | Freq: Every day | ORAL | Status: DC | PRN

## 2019-05-01 MED ORDER — VITAMIN D 25 MCG (1000 UNIT) PO TABS
1000.0000 [IU] | ORAL_TABLET | Freq: Every day | ORAL | Status: DC
  Administered 2019-05-02 – 2019-05-06 (×3): 1000 [IU] via ORAL
  Filled 2019-05-01 (×6): qty 1

## 2019-05-01 MED ORDER — ONDANSETRON HCL 4 MG PO TABS: 4.0000 mg | ORAL_TABLET | Freq: Four times a day (QID) | ORAL | Status: DC | PRN

## 2019-05-01 MED ORDER — SENNOSIDES-DOCUSATE SODIUM 8.6-50 MG PO TABS: 1.0000 | ORAL_TABLET | Freq: Every evening | ORAL | Status: DC | PRN

## 2019-05-01 MED ORDER — SODIUM CHLORIDE 0.9 % IV SOLN
INTRAVENOUS | Status: DC
  Administered 2019-05-02 – 2019-05-04 (×6): via INTRAVENOUS

## 2019-05-01 MED ORDER — SENNOSIDES-DOCUSATE SODIUM 8.6-50 MG PO TABS
1.0000 | ORAL_TABLET | Freq: Every day | ORAL | Status: DC
  Administered 2019-05-02 – 2019-05-06 (×3): 1 via ORAL
  Filled 2019-05-01 (×6): qty 1

## 2019-05-01 MED ORDER — ONDANSETRON HCL 4 MG/2ML IJ SOLN
4.0000 mg | Freq: Four times a day (QID) | INTRAMUSCULAR | Status: DC | PRN
  Administered 2019-05-02 – 2019-05-09 (×10): 4 mg via INTRAVENOUS
  Filled 2019-05-01 (×11): qty 2

## 2019-05-01 MED ORDER — ACETAMINOPHEN 650 MG RE SUPP: 650.0000 mg | Freq: Four times a day (QID) | RECTAL | Status: DC | PRN

## 2019-05-01 MED ORDER — ACETAMINOPHEN 325 MG PO TABS
650.0000 mg | ORAL_TABLET | Freq: Four times a day (QID) | ORAL | Status: DC | PRN
  Administered 2019-05-05: 650 mg via ORAL
  Filled 2019-05-01: qty 2

## 2019-05-01 MED ORDER — ENSURE ENLIVE PO LIQD
237.0000 mL | Freq: Two times a day (BID) | ORAL | Status: DC
  Administered 2019-05-02 (×2): 237 mL via ORAL

## 2019-05-01 MED ORDER — HEPARIN SODIUM (PORCINE) 5000 UNIT/ML IJ SOLN
5000.0000 [IU] | Freq: Three times a day (TID) | INTRAMUSCULAR | Status: DC
  Administered 2019-05-02 – 2019-05-05 (×8): 5000 [IU] via SUBCUTANEOUS
  Filled 2019-05-01 (×8): qty 1

## 2019-05-01 NOTE — ED Notes (Signed)
[place] pt son

## 2019-05-01 NOTE — Progress Notes (Signed)
Advanced Care Plan.  Purpose of Encounter: CODE STATUS. Parties in Attendance: The patient, her son and me. Patient's Decisional Capacity: No. Medical Story: Emily [name]  is a 85 y.o. female with a known history of colon cancer, difficult intubation, diverticulosis, hypertension, osteopenia and arthritis.  The patient is being admitted for acute renal failure due to dehydration.  I discussed with the patient's son about the patient's current condition, prognosis and CODE STATUS.  The patient's son said the patient does not want to be resuscitated or intubated if she has cardiopulmonary arrest. Plan:  Code Status: DNR. Other documents completed:  Time spent discussing advance care planning

## 2019-05-01 NOTE — ED Notes (Signed)
ED TO INPATIENT HANDOFF REPORT  ED Nurse Name and Phone #: Caryl Pina, RN [phone]  S Name/Age/Gender Emily [name] 85 y.o. female Room/Bed: ED11A/ED11A  Code Status   Code Status: Prior  Home/SNF/Other Nursing Home Patient oriented to: self and place Is this baseline? Yes   Triage Complete: Triage complete  Chief Complaint abnormal labs  [facility] states pt was sent for critical lab values. ems reports unsure what lab values were critical. pt alert & oriented to self. NAD noted at this time   Allergies Allergies  Allergen Reactions  . Amlodipine Besylate Other (See Comments)    Patient unaware of any allergy with this medicine  . Oxycodone     confusion    Level of Care/Admitting Diagnosis ED Disposition    ED Disposition Condition [facility] Area: [place] [[id number]]  Level of Care: Med-Surg [16]  Covid Evaluation: Screening Protocol (No Symptoms)  Diagnosis: ARF (acute renal failure) [facility]) [[id number]]  Admitting Physician: Demetrios Loll [[id number]]  Attending Physician: Demetrios Loll [[id number]]  Estimated length of stay: past midnight tomorrow  Certification:: I certify this patient will need inpatient services for at least 2 midnights  PT Class (Do Not Modify): Inpatient [101]  PT Acc Code (Do Not Modify): Private [1]       B Medical/Surgery History Past Medical History:  Diagnosis Date  . Arthritis    fingers  . Cancer ([place])    rectal  . Cancer ([place]) 2012   colon cancer  . Cancer of contiguous sites of hypopharynx ([place]) 12/23/2015  . Colon cancer ([place])   . Difficult intubation   . Diverticulosis   . Dyspnea    SOB  . Hemorrhoids   . History of chicken pox   . HOH (hard of hearing)   . Hypertension   . Osteopenia   . Status post chemotherapy    colon cancer  . Status post radiation therapy    colon cancer 2013   Past Surgical History:  Procedure Laterality Date  .  ABDOMINAL HYSTERECTOMY    . ABDOMINAL HYSTERECTOMY  1980   partial  . APPENDECTOMY    . APPENDECTOMY  [id number]   Dr. Pat Patrick  . BREAST CYST ASPIRATION Bilateral    negative  . BREAST SURGERY     cyst removal  . BREAST SURGERY  1960   Breast Biopsy  . CARDIAC CATHETERIZATION N/A 06/23/2016   Procedure: Left Heart Cath and Coronary Angiography;  Surgeon: Corey Skains, MD;  Location: [facility] CV LAB;  Service: Cardiovascular;  Laterality: N/A;  . CATARACT EXTRACTION W/PHACO Left 06/01/2017   Procedure: CATARACT EXTRACTION PHACO AND INTRAOCULAR LENS PLACEMENT (IOC);  Surgeon: Birder Robson, MD;  Location: [facility] ORS;  Service: Ophthalmology;  Laterality: Left;  [place] 00:55 AP% 19.4 CDE 10.71 Fluid pack lot [id number]  . CATARACT EXTRACTION W/PHACO Right 07/06/2017   Procedure: CATARACT EXTRACTION PHACO AND INTRAOCULAR LENS PLACEMENT (IOC);  Surgeon: Birder Robson, MD;  Location: [facility] ORS;  Service: Ophthalmology;  Laterality: Right;  [place]  00:44.7 AP% 18.2 CDE 8.10 Fluid Pack lot # [id number]  . COLON SURGERY  06/28/2013   resected tumor [facility] colon; [facility]  . COLONOSCOPY    . COLONOSCOPY N/A 10/01/2015   Procedure: COLONOSCOPY;  Surgeon: [name], MD;  Location: [facility] ENDOSCOPY;  Service: Endoscopy;  Laterality: N/A;  . DILATION AND CURETTAGE OF UTERUS  1957  . DIRECT LARYNGOSCOPY  N/A 12/11/2015   Procedure: DIRECT LARYNGOSCOPY;  Surgeon: Clyde Canterbury, MD;  Location: [facility] ORS;  Service: ENT;  Laterality: N/A;  . ESOPHAGOSCOPY N/A 12/11/2015   Procedure: ESOPHAGOSCOPY;  Surgeon: Clyde Canterbury, MD;  Location: [facility] ORS;  Service: ENT;  Laterality: N/A;  . EUS N/A 01/20/2013   Procedure: LOWER ENDOSCOPIC ULTRASOUND (EUS);  Surgeon: Milus Banister, MD;  Location: [facility];  Service: Endoscopy;  Laterality: N/A;  . JOINT REPLACEMENT    . PEG PLACEMENT N/A 01/01/2016   Procedure: PERCUTANEOUS ENDOSCOPIC GASTROSTOMY (PEG) PLACEMENT;  Surgeon: [name],  MD;  Location: [facility] ORS;  Service: General;  Laterality: N/A;  . PORTACATH PLACEMENT Right 01/01/2016   Procedure: INSERTION PORT-A-CATH;  Surgeon: [name], MD;  Location: [facility] ORS;  Service: General;  Laterality: Right;  . TONSILLECTOMY    . TOTAL HIP ARTHROPLASTY Right 06/24/2016   Procedure: TOTAL HIP ARTHROPLASTY ANTERIOR APPROACH;  Surgeon: Hessie Knows, MD;  Location: [facility] ORS;  Service: Orthopedics;  Laterality: Right;     A IV Location/Drains/Wounds Patient Lines/Drains/Airways Status   Active Line/Drains/Airways    Name:   Placement date:   Placement time:   Site:   Days:   Peripheral IV 04/21/2019 Right;Anterior Forearm   04/03/2019    2037    Forearm   less than 1   External Urinary Catheter   04/01/19    0408    -   30   Pressure Injury 04/01/19 Sacrum Mid Stage I -  Intact skin with non-blanchable redness of a localized area usually over a bony prominence. 4.0 cm   04/01/19    0030     30          Intake/Output Last 24 hours No intake or output data in the 24 hours ending 04/30/2019 2059  Labs/Imaging Results for orders placed or performed during the hospital encounter of 04/25/2019 (from the past 48 hour(s))  Comprehensive metabolic panel     Status: Abnormal   Collection Time: 04/14/2019  6:22 PM  Result Value Ref Range   Sodium 134 (L) 135 - 145 mmol/L   Potassium 4.7 3.5 - 5.1 mmol/L   Chloride 97 (L) 98 - 111 mmol/L   CO2 25 22 - 32 mmol/L   Glucose, Bld 94 70 - 99 mg/dL   BUN 74 (H) 8 - 23 mg/dL   Creatinine, Ser 3.59 (H) 0.44 - 1.00 mg/dL   Calcium 8.7 (L) 8.9 - 10.3 mg/dL   Total Protein 5.9 (L) 6.5 - 8.1 g/dL   Albumin 2.7 (L) 3.5 - 5.0 g/dL   AST 28 15 - 41 U/L   ALT 16 0 - 44 U/L   Alkaline Phosphatase 607 (H) 38 - 126 U/L   Total Bilirubin 0.6 0.3 - 1.2 mg/dL   GFR calc non Af Amer 11 (L) >60 mL/min   GFR calc Af Amer 13 (L) >60 mL/min   Anion gap 12 5 - 15    Comment: Performed at [facility], [address] [place],  [address]   CBC     Status: None   Collection Time: 04/09/2019  6:22 PM  Result Value Ref Range   WBC 10.4 4.0 - 10.5 K/uL   RBC 4.78 3.87 - 5.11 MIL/uL   Hemoglobin 14.1 12.0 - 15.0 g/dL   HCT 42.5 36.0 - 46.0 %   MCV 88.9 80.0 - 100.0 fL   MCH 29.5 26.0 - 34.0 pg   MCHC 33.2 30.0 - 36.0  g/dL   RDW 14.6 11.5 - 15.5 %   Platelets 152 150 - 400 K/uL   nRBC 0.0 0.0 - 0.2 %    Comment: Performed at [facility], [address] [place], [place] [address]   No results found.  Pending Labs [employer] (From admission, onward)    Start     Ordered   04/12/2019 1938  SARS Coronavirus 2 (CEPHEID - Performed in [place] hospital lab), [place]  (Asymptomatic Patients Labs)  Once,   STAT    Question:  Rule Out  Answer:  Yes   04/19/2019 1937   04/21/2019 1822  Urinalysis, Complete w Microscopic  Once,   STAT     04/09/2019 1821   Signed and Held  Creatinine, serum  (heparin)  Once,   R    Comments: Baseline for heparin therapy IF NOT ALREADY DRAWN.    Signed and Held   Signed and Held  Basic metabolic panel  Tomorrow morning,   R     Signed and Held   Signed and Held  CBC  Tomorrow morning,   R     Signed and Held          Vitals/Pain Today's Vitals   04/16/2019 1739 04/26/2019 1745 04/30/2019 1747 04/30/2019 2033  BP:  111/71  (!) 105/48  Pulse:  91    Resp:  20  16  Temp:  97.9 F (36.6 C)    TempSrc:  Oral    SpO2: 95% 94%    Weight:   52.8 kg   Height:   5\' 4"  (1.626 m)   PainSc:   0-No pain     Isolation Precautions No active isolations  Medications Medications  sodium chloride 0.9 % bolus 1,000 mL (1,000 mLs Intravenous New Bag/Given 04/13/2019 2049)    Mobility walks with person assist     Focused Assessments N/A   R Recommendations: See Admitting Provider Note  Report given to:   Additional Notes:  Memory loss noted upon assessment.  Old bruise, hematoma noted to left forehead; pt unable to recall incident.

## 2019-05-01 NOTE — Telephone Encounter (Signed)
RN made call to patient's son [name] to follow up on patient's status.  Patient remains at [facility] per son.  Son states patient will be moving to [facility] once CoVid 19 test results are back. Son states patient had X-Ray over the weekend as facility is concerned patient may have another blockage.  Son informed RN would contact SW Tiffany at facility to schedule palliative care visit.

## 2019-05-01 NOTE — ED Triage Notes (Signed)
[facility]s states pt was sent for critical lab values. ems reports unsure what lab values were critical. pt alert & oriented to self. NAD noted at this time

## 2019-05-01 NOTE — ED Notes (Signed)
Hospitalist to bedside at this time 

## 2019-05-01 NOTE — H&P (Signed)
[facility] [place] NAME: Emily [name]    MR#:  [id number]  DATE OF BIRTH:  1933/06/29  DATE OF ADMISSION:  04/03/2019  PRIMARY CARE PHYSICIAN: [name], MD   REQUESTING/REFERRING PHYSICIAN: Dr. Owens Shark  CHIEF COMPLAINT:   Chief Complaint  Patient presents with  . Abnormal Lab   Abnormal lab. HISTORY OF PRESENT ILLNESS:  Emily [name]  is a 85 y.o. female with a known history of multiple medical history as below.  The patient is sent from nursing home to ED due to abnormal lab with elevated creatinine from 1.0 to 3.59.  The patient has dementia and is not a good historian.  Dr. Owens Shark requested admission. PAST MEDICAL HISTORY:   Past Medical History:  Diagnosis Date  . Arthritis    fingers  . Cancer ([place])    rectal  . Cancer ([place]) 2012   colon cancer  . Cancer of contiguous sites of hypopharynx ([place]) 12/23/2015  . Colon cancer ([place])   . Difficult intubation   . Diverticulosis   . Dyspnea    SOB  . Hemorrhoids   . History of chicken pox   . HOH (hard of hearing)   . Hypertension   . Osteopenia   . Status post chemotherapy    colon cancer  . Status post radiation therapy    colon cancer 2013    PAST SURGICAL HISTORY:   Past Surgical History:  Procedure Laterality Date  . ABDOMINAL HYSTERECTOMY    . ABDOMINAL HYSTERECTOMY  1980   partial  . APPENDECTOMY    . APPENDECTOMY  [id number]   Dr. Pat Patrick  . BREAST CYST ASPIRATION Bilateral    negative  . BREAST SURGERY     cyst removal  . BREAST SURGERY  1960   Breast Biopsy  . CARDIAC CATHETERIZATION N/A 06/23/2016   Procedure: Left Heart Cath and Coronary Angiography;  Surgeon: Corey Skains, MD;  Location: [place] CV LAB;  Service: Cardiovascular;  Laterality: N/A;  . CATARACT EXTRACTION W/PHACO Left 06/01/2017   Procedure: CATARACT EXTRACTION PHACO AND INTRAOCULAR LENS PLACEMENT (IOC);  Surgeon: Birder Robson, MD;  Location: [facility] ORS;  Service:  Ophthalmology;  Laterality: Left;  [place] 00:55 AP% 19.4 CDE 10.71 Fluid pack lot [id number]  . CATARACT EXTRACTION W/PHACO Right 07/06/2017   Procedure: CATARACT EXTRACTION PHACO AND INTRAOCULAR LENS PLACEMENT (IOC);  Surgeon: Birder Robson, MD;  Location: [facility] ORS;  Service: Ophthalmology;  Laterality: Right;  [place]  00:44.7 AP% 18.2 CDE 8.10 Fluid Pack lot # [id number]  . COLON SURGERY  06/28/2013   resected tumor from colon; [facility]  . COLONOSCOPY    . COLONOSCOPY N/A 10/01/2015   Procedure: COLONOSCOPY;  Surgeon: [name], MD;  Location: [facility] ENDOSCOPY;  Service: Endoscopy;  Laterality: N/A;  . DILATION AND CURETTAGE OF UTERUS  1957  . DIRECT LARYNGOSCOPY N/A 12/11/2015   Procedure: DIRECT LARYNGOSCOPY;  Surgeon: Clyde Canterbury, MD;  Location: [facility] ORS;  Service: ENT;  Laterality: N/A;  . ESOPHAGOSCOPY N/A 12/11/2015   Procedure: ESOPHAGOSCOPY;  Surgeon: Clyde Canterbury, MD;  Location: [facility] ORS;  Service: ENT;  Laterality: N/A;  . EUS N/A 01/20/2013   Procedure: LOWER ENDOSCOPIC ULTRASOUND (EUS);  Surgeon: Milus Banister, MD;  Location: [facility];  Service: Endoscopy;  Laterality: N/A;  . JOINT REPLACEMENT    . PEG PLACEMENT N/A 01/01/2016   Procedure: PERCUTANEOUS ENDOSCOPIC GASTROSTOMY (PEG) PLACEMENT;  Surgeon: [name], MD;  Location: [facility] ORS;  Service: General;  Laterality: N/A;  . PORTACATH PLACEMENT Right 01/01/2016   Procedure: INSERTION PORT-A-CATH;  Surgeon: [name], MD;  Location: [facility] ORS;  Service: General;  Laterality: Right;  . TONSILLECTOMY    . TOTAL HIP ARTHROPLASTY Right 06/24/2016   Procedure: TOTAL HIP ARTHROPLASTY ANTERIOR APPROACH;  Surgeon: Hessie Knows, MD;  Location: [facility] ORS;  Service: Orthopedics;  Laterality: Right;    SOCIAL HISTORY:   Social History   Tobacco Use  . Smoking status: Former Smoker    Packs/day: 1.00    Years: 64.00    Pack years: 64.00    Types: Cigarettes  . Smokeless tobacco: Never Used   Substance Use Topics  . Alcohol use: Yes    Alcohol/week: 0.0 standard drinks    Comment: rarely    FAMILY HISTORY:   Family History  Problem Relation Age of Onset  . Kidney failure Father   . Breast cancer Maternal Grandmother     DRUG ALLERGIES:   Allergies  Allergen Reactions  . Amlodipine Besylate Other (See Comments)    Patient unaware of any allergy with this medicine  . Oxycodone     confusion    REVIEW OF SYSTEMS:   Review of Systems  Unable to perform ROS: Dementia    MEDICATIONS AT HOME:   Prior to Admission medications   Medication Sig Start Date End Date Taking? Authorizing Provider  Cholecalciferol (VITAMIN D-3) 1000 units CAPS Take 1,000 Units by mouth daily.     [provider]  cyanocobalamin 1000 MCG tablet Take 1,000 mcg by mouth daily.    [provider]  donepezil (ARICEPT) 5 MG tablet Take 1 tablet (5 mg total) by mouth at bedtime. Patient not taking: Reported on 12/06/2018 03/16/18   [name], MD  feeding supplement, ENSURE ENLIVE, (ENSURE ENLIVE) LIQD Take 237 mLs by mouth 2 (two) times daily between meals. 04/04/19   [name], MD  lisinopril (PRINIVIL,ZESTRIL) 10 MG tablet One tablet daily Patient taking differently: Take 10 mg by mouth daily.  03/17/18   [name], MD  metoprolol succinate (TOPROL-XL) 25 MG 24 hr tablet Take 0.5 tablets (12.5 mg total) by mouth daily. 05/16/18   [name], MD      VITAL SIGNS:  Blood pressure 111/71, pulse 91, temperature 97.9 F (36.6 C), temperature source Oral, resp. rate 20, height 5\' 4"  (1.626 m), weight 52.8 kg, SpO2 94 %.  PHYSICAL EXAMINATION:  Physical Exam  GENERAL:  85 y.o.-year-old patient lying in the bed with no acute distress.  EYES: Pupils equal, round, reactive to light and accommodation. No scleral icterus. Extraocular muscles intact.  HEENT: Head atraumatic, normocephalic. Oropharynx and nasopharynx clear.  NECK:  Supple, no jugular venous  distention. No thyroid enlargement, no tenderness.  LUNGS: Normal breath sounds bilaterally, no wheezing, rales,rhonchi or crepitation. No use of accessory muscles of respiration.  CARDIOVASCULAR: S1, S2 normal. No murmurs, rubs, or gallops.  ABDOMEN: Soft, nontender, nondistended. Bowel sounds present. No organomegaly or mass.  EXTREMITIES: No pedal edema, cyanosis, or clubbing.  NEUROLOGIC: Unable to exam. PSYCHIATRIC: The patient is demented. SKIN: No obvious rash, lesion, or ulcer.   LABORATORY PANEL:   CBC Recent Labs  Lab 04/12/2019 1822  WBC 10.4  HGB 14.1  HCT 42.5  PLT 152   ------------------------------------------------------------------------------------------------------------------  Chemistries  Recent Labs  Lab 04/08/2019 1822  NA 134*  K 4.7  CL 97*  CO2 25  GLUCOSE 94  BUN 74*  CREATININE 3.59*  CALCIUM 8.7*  AST 28  ALT 16  ALKPHOS 607*  BILITOT 0.6   ------------------------------------------------------------------------------------------------------------------  Cardiac Enzymes No results for input(s): TROPONINI in the last 168 hours. ------------------------------------------------------------------------------------------------------------------  RADIOLOGY:  No results found.    IMPRESSION AND PLAN:   Acute renal failure due to dehydration. The patient will be admitted to medical floor. IV fluid support, renal ultrasound and follow-up BMP.  Hold lisinopril.  Hyponatremia.  Treatment as above. Hypertension.  Hold lisinopril and Lopressor due to low side blood pressure. Dementia.  Aspiration and fall precaution.  All the records are reviewed and case discussed with ED provider. Management plans discussed with the patient, her son and they are in agreement.  CODE STATUS: DNR.  TOTAL TIME TAKING CARE OF THIS PATIENT: 52 minutes.  Demetrios Loll M.D on 04/04/2019 at 7:35 PM  Between 7am to 6pm - Pager - [phone]  After 6pm go to  [email or web address] - Proofreader  [facility] [place] [facility]  Office  [phone]  CC: Primary care physician; [name], MD   Note: This dictation was prepared with Dragon dictation along with smaller phrase technology. Any transcriptional errors that result from this process are unin.

## 2019-05-01 NOTE — ED Notes (Signed)
[facility]. They report BUN of 70.7 and creatinine of 3.32. [place] states they requested to keep pt at facility and do IV treatment there but pt son was insistent that she be sent out for care

## 2019-05-01 NOTE — ED Provider Notes (Signed)
[facility] Emergency Department Provider Note  ____________________________________________   First MD Initiated Contact with Patient 04/07/2019 1940     (approximate)  I have reviewed the triage vital signs and the nursing notes.   HISTORY  Chief Complaint Abnormal Lab    HPI Emily [name] is a 85 y.o. female  with below list of previous medical conditions presents to the emergency department via EMS from [place] secondary to abnormal laboratory data.  We were notified of the patient had an elevated BUN and creatinine.  Patient denies any complaints at present other than "feeling thirsty".        Past Medical History:  Diagnosis Date  . Arthritis    fingers  . Cancer ([place])    rectal  . Cancer ([place]) 2012   colon cancer  . Cancer of contiguous sites of hypopharynx ([place]) 12/23/2015  . Colon cancer ([place])   . Difficult intubation   . Diverticulosis   . Dyspnea    SOB  . Hemorrhoids   . History of chicken pox   . HOH (hard of hearing)   . Hypertension   . Osteopenia   . Status post chemotherapy    colon cancer  . Status post radiation therapy    colon cancer 2013    Patient Active Problem List   Diagnosis Date Noted  . ARF (acute renal failure) ([place]) 04/07/2019  . Pressure injury of skin 04/01/2019  . UTI (urinary tract infection) 03/31/2019  . Acute on chronic renal failure ([place]) 03/31/2019  . 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 Systolic dysfunction 02/72/5366  . Malnutrition of moderate degree 07/21/2016  . Lower GI bleed 07/18/2016  . History of non-ST elevation myocardial infarction (NSTEMI) 06/21/2016  . Closed right hip fracture ([place]) 06/21/2016  . Ischemic chest pain ([place]) 06/21/2016  . 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 Cancer of contiguous sites of hypopharynx ([place]) 12/23/2015  . History of colon cancer 05/09/2015  . Diverticulosis 05/09/2015  . Fatigue 05/09/2015  . Hemorrhoid 05/09/2015  .  Hypertension 05/09/2015  . Insomnia 05/09/2015  . Osteopenia 05/09/2015  . Skin lesion of back 05/09/2015  . Tobacco abuse 05/09/2015  . Vitamin D deficiency 05/09/2015  . Acid reflux 06/13/2013  . Arthritis, degenerative 06/13/2013  . Disuse syndrome 06/13/2013  . Rectal cancer ([place]) 01/20/2013    Past Surgical History:  Procedure Laterality Date  . ABDOMINAL HYSTERECTOMY    . ABDOMINAL HYSTERECTOMY  1980   partial  . APPENDECTOMY    . APPENDECTOMY  [id number]   Dr. Pat Patrick  . BREAST CYST ASPIRATION Bilateral    negative  . BREAST SURGERY     cyst removal  . BREAST SURGERY  1960   Breast Biopsy  . CARDIAC CATHETERIZATION N/A 06/23/2016   Procedure: Left Heart Cath and Coronary Angiography;  Surgeon: Corey Skains, MD;  Location: [facility] LAB;  Service: Cardiovascular;  Laterality: N/A;  . CATARACT EXTRACTION W/PHACO Left 06/01/2017   Procedure: CATARACT EXTRACTION PHACO AND INTRAOCULAR LENS PLACEMENT (IOC);  Surgeon: Birder Robson, MD;  Location: [facility] ORS;  Service: Ophthalmology;  Laterality: Left;  [place] 00:55 AP% 19.4 CDE 10.71 Fluid pack lot [id number]  . CATARACT EXTRACTION W/PHACO Right 07/06/2017   Procedure: CATARACT EXTRACTION PHACO AND INTRAOCULAR LENS PLACEMENT (IOC);  Surgeon: Birder Robson, MD;  Location: [facility] ORS;  Service: Ophthalmology;  Laterality: Right;  [place]  00:44.7 AP% 18.2 CDE 8.10 Fluid Pack lot # [id number]  . COLON SURGERY  06/28/2013   resected tumor from colon; [facility]  . COLONOSCOPY    . COLONOSCOPY N/A 10/01/2015   Procedure: COLONOSCOPY;  Surgeon: [name], MD;  Location: [facility] ENDOSCOPY;  Service: Endoscopy;  Laterality: N/A;  . DILATION AND CURETTAGE OF UTERUS  1957  . DIRECT LARYNGOSCOPY N/A 12/11/2015   Procedure: DIRECT LARYNGOSCOPY;  Surgeon: Clyde Canterbury, MD;  Location: [facility] ORS;  Service: ENT;  Laterality: N/A;  . ESOPHAGOSCOPY N/A 12/11/2015   Procedure: ESOPHAGOSCOPY;  Surgeon: Clyde Canterbury, MD;   Location: [facility] ORS;  Service: ENT;  Laterality: N/A;  . EUS N/A 01/20/2013   Procedure: LOWER ENDOSCOPIC ULTRASOUND (EUS);  Surgeon: Milus Banister, MD;  Location: [facility];  Service: Endoscopy;  Laterality: N/A;  . JOINT REPLACEMENT    . PEG PLACEMENT N/A 01/01/2016   Procedure: PERCUTANEOUS ENDOSCOPIC GASTROSTOMY (PEG) PLACEMENT;  Surgeon: [name], MD;  Location: [facility] ORS;  Service: General;  Laterality: N/A;  . PORTACATH PLACEMENT Right 01/01/2016   Procedure: INSERTION PORT-A-CATH;  Surgeon: [name], MD;  Location: [facility] ORS;  Service: General;  Laterality: Right;  . TONSILLECTOMY    . TOTAL HIP ARTHROPLASTY Right 06/24/2016   Procedure: TOTAL HIP ARTHROPLASTY ANTERIOR APPROACH;  Surgeon: Hessie Knows, MD;  Location: [facility] ORS;  Service: Orthopedics;  Laterality: Right;    Prior to Admission medications   Medication Sig Start Date End Date Taking? Authorizing Provider  bisacodyl (DULCOLAX) 10 MG suppository Place 10 mg rectally as needed for moderate constipation.   Yes [provider]  bisacodyl (DULCOLAX) 5 MG EC tablet Take 5 mg by mouth daily as needed for moderate constipation.   Yes [provider]  Cholecalciferol (VITAMIN D-3) 1000 units CAPS Take 1,000 Units by mouth daily.    Yes [provider]  cyanocobalamin 1000 MCG tablet Take 1,000 mcg by mouth daily.   Yes [provider]  donepezil (ARICEPT) 5 MG tablet Take 1 tablet (5 mg total) by mouth at bedtime. 03/16/18  Yes [name], MD  lisinopril (PRINIVIL,ZESTRIL) 10 MG tablet One tablet daily Patient taking differently: Take 10 mg by mouth daily.  03/17/18  Yes [name], MD  metoCLOPramide (REGLAN) 5 MG tablet Take 5 mg by mouth 4 (four) times daily.   Yes [provider]  metoprolol succinate (TOPROL-XL) 25 MG 24 hr tablet Take 0.5 tablets (12.5 mg total) by mouth daily. 05/16/18  Yes [name], MD  ondansetron (ZOFRAN) 4 MG tablet Take 4 mg by  mouth every 6 (six) hours as needed for nausea or vomiting.   Yes [provider]  sennosides-docusate sodium (SENOKOT-S) 8.6-50 MG tablet Take 1 tablet by mouth daily.   Yes [provider]  spironolactone (ALDACTONE) 25 MG tablet Take 25 mg by mouth daily. Mondays,Wednesday, Fridays   Yes [provider]    Allergies Amlodipine besylate and Oxycodone  Family History  Problem Relation Age of Onset  . Kidney failure Father   . Breast cancer Maternal Grandmother     Social History Social History   Tobacco Use  . Smoking status: Former Smoker    Packs/day: 1.00    Years: 64.00    Pack years: 64.00    Types: Cigarettes  . Smokeless tobacco: Never Used  Substance Use Topics  . Alcohol use: Yes    Alcohol/week: 0.0 standard drinks    Comment: rarely  . Drug use: No    Review of Systems Constitutional: No fever/chills Eyes: No  visual changes. ENT: No sore throat. Cardiovascular: Denies chest pain. Respiratory: Denies shortness of breath. Gastrointestinal: No abdominal pain.  No nausea, no vomiting.  No diarrhea.  No constipation. Genitourinary: Negative for dysuria. Musculoskeletal: Negative for neck pain.  Negative for back pain. Integumentary: Negative for rash. Neurological: Negative for headaches, focal weakness or numbness.   ____________________________________________   PHYSICAL EXAM:  VITAL SIGNS: ED Triage Vitals  Enc Vitals Group     BP 04/03/2019 1745 111/71     Pulse Rate 04/29/2019 1745 91     Resp 04/16/2019 1745 20     Temp 04/27/2019 1745 97.9 F (36.6 C)     Temp Source 04/11/2019 1745 Oral     SpO2 04/05/2019 1739 95 %     Weight 04/10/2019 1747 52.8 kg (116 lb 6.4 oz)     Height 04/16/2019 1747 1.626 m (5\' 4" )     Head Circumference --      Peak Flow --      Pain Score 04/04/2019 1747 0     Pain Loc --      Pain Edu? --      Excl. in [place]? --     Constitutional: Alert and oriented. Well appearing and in no acute distress. Eyes:  Conjunctivae are normal.  Mouth/Throat: Mucous membranes are dry.  Oropharynx non-erythematous. Neck: No stridor.   Cardiovascular: Normal rate, regular rhythm. Good peripheral circulation. Grossly normal heart sounds. Respiratory: Normal respiratory effort.  No retractions. No audible wheezing. Gastrointestinal: Soft and nontender. No distention.  Musculoskeletal: No lower extremity tenderness nor edema. No gross deformities of extremities. Neurologic:  Normal speech and language. No gross focal neurologic deficits are appreciated.  Skin:  Skin is warm, dry and intact. No rash noted. Psychiatric: Mood and affect are normal. Speech and behavior are normal.  ____________________________________________   LABS (all labs ordered are listed, but only abnormal results are displayed)  Labs Reviewed  COMPREHENSIVE METABOLIC PANEL - Abnormal; Notable for the following components:      Result Value   Sodium 134 (*)    Chloride 97 (*)    BUN 74 (*)    Creatinine, Ser 3.59 (*)    Calcium 8.7 (*)    Total Protein 5.9 (*)    Albumin 2.7 (*)    Alkaline Phosphatase 607 (*)    GFR calc non Af Amer 11 (*)    GFR calc Af Amer 13 (*)    All other components within normal limits  SARS CORONAVIRUS 2 (HOSPITAL ORDER, [facility])  CBC  URINALYSIS, COMPLETE (UACMP) WITH MICROSCOPIC   ____________________________________________  EKG  ED ECG REPORT I, [place] N Nigil Braman, the attending physician, personally viewed and interpreted this ECG.   Date: 04/24/2019  EKG Time: 5:58 PM  rate: 83  Rhythm: Normal sinus rhythm  Axis: Normal  Intervals: Normal  ST&T Change: None    Procedures   ____________________________________________   INITIAL IMPRESSION / MDM / ASSESSMENT AND PLAN / ED COURSE  As part of my medical decision making, I reviewed the following data within the electronic MEDICAL RECORD NUMBER   85 year old female presenting to the emergency department  referred secondary to elevated BUN and creatinine which was noted at [employer].  Patient's BUN and creatinine noted to be elevated here BUN 74 creatinine 3.59.  Comparison from 04/04/2019 reveal a BUN of 15 with a creatinine of 1.0.  Patient received IV normal saline.  Patient discussed with Dr. Bridgett Larsson for hospital admission  further evaluation and management of acute renal insufficiency     *Emily [name] was evaluated in Emergency Department on 04/30/2019 for the symptoms described in the history of present illness. She was evaluated in the context of the global COVID-19 pandemic, which necessitated consideration that the patient might be at risk for infection with the SARS-CoV-2 virus that causes COVID-19. Institutional protocols and algorithms that pertain to the evaluation of patients at risk for COVID-19 are in a state of rapid change based on information released by regulatory bodies including the CDC and federal and state organizations. These policies and algorithms were followed during the patient's care in the ED.  Some ED evaluations and interventions may be delayed as a result of limited staffing during the pandemic.*    ____________________________________________  FINAL CLINICAL IMPRESSION(S) / ED DIAGNOSES  Final diagnoses:  Acute renal failure, unspecified acute renal failure type ([place])     MEDICATIONS GIVEN DURING THIS VISIT:  Medications  sodium chloride 0.9 % bolus 1,000 mL (1,000 mLs Intravenous New Bag/Given 04/27/2019 2049)     ED Discharge Orders    None       Note:  This document was prepared using Dragon voice recognition software and may include unintentional dictation errors.   Gregor Hams, MD 04/09/2019 2059

## 2019-05-01 NOTE — ED Notes (Signed)
IV team to bedside at this time 

## 2019-05-02 ENCOUNTER — Telehealth: Payer: Self-pay

## 2019-05-02 ENCOUNTER — Inpatient Hospital Stay: Payer: Medicare Other

## 2019-05-02 LAB — CBC
HCT: 36.8 % (ref 36.0–46.0)
Hemoglobin: 11.9 g/dL — ABNORMAL LOW (ref 12.0–15.0)
MCH: 28.9 pg (ref 26.0–34.0)
MCHC: 32.3 g/dL (ref 30.0–36.0)
MCV: 89.3 fL (ref 80.0–100.0)
Platelets: 134 10*3/uL — ABNORMAL LOW (ref 150–400)
RBC: 4.12 MIL/uL (ref 3.87–5.11)
RDW: 14.6 % (ref 11.5–15.5)
WBC: 8.2 10*3/uL (ref 4.0–10.5)
nRBC: 0 % (ref 0.0–0.2)

## 2019-05-02 LAB — BASIC METABOLIC PANEL
Anion gap: 12 (ref 5–15)
BUN: 76 mg/dL — ABNORMAL HIGH (ref 8–23)
CO2: 21 mmol/L — ABNORMAL LOW (ref 22–32)
Calcium: 8 mg/dL — ABNORMAL LOW (ref 8.9–10.3)
Chloride: 103 mmol/L (ref 98–111)
Creatinine, Ser: 3.48 mg/dL — ABNORMAL HIGH (ref 0.44–1.00)
GFR calc Af Amer: 13 mL/min — ABNORMAL LOW (ref >60)
GFR calc non Af Amer: 12 mL/min — ABNORMAL LOW (ref >60)
Glucose, Bld: 79 mg/dL (ref 70–99)
Potassium: 4.1 mmol/L (ref 3.5–5.1)
Sodium: 136 mmol/L (ref 135–145)

## 2019-05-02 LAB — MRSA PCR SCREENING: MRSA by PCR: NEGATIVE

## 2019-05-02 LAB — PROTEIN / CREATININE RATIO, URINE
Creatinine, Urine: 309 mg/dL
Protein Creatinine Ratio: 0.59 mg/mg{Cre} — ABNORMAL HIGH (ref 0.00–0.15)
Total Protein, Urine: 181 mg/dL

## 2019-05-02 NOTE — Progress Notes (Signed)
Please note patient is currently followed by outpatient Palliative at [facility]. CSW Juliann Pulse made aware. Outpatient Palliative team updated to admission.  Flo Shanks BSN, RN, [facility] Liaison Author Care collective [phone]

## 2019-05-02 NOTE — Plan of Care (Signed)
Patient will meet most of these outcomes.

## 2019-05-02 NOTE — Telephone Encounter (Signed)
Telephone call to SW Tiffany at [facility] to schedule Telehealth visit.  Tiffany in areement with Telehealth visit on Thursday at at 10:00 AM.

## 2019-05-02 NOTE — Progress Notes (Signed)
Initial Nutrition Assessment  RD working remotely.  DOCUMENTATION CODES:   Not applicable  INTERVENTION:  Will downgrade diet to dysphagia 3 with thin liquids.  Continue Ensure Enlive po BID, each supplement provides 350 kcal and 20 grams of protein.  NUTRITION DIAGNOSIS:   Inadequate oral intake related to decreased appetite as evidenced by meal completion < 50%.  GOAL:   Patient will meet greater than or equal to 90% of their needs  MONITOR:   PO intake, Supplement acceptance, Labs, Weight trends, Skin, I & O's  REASON FOR ASSESSMENT:   Malnutrition Screening Tool    ASSESSMENT:   85 year old female with PMHx of dementia, HTN, diverticulosis, arthritis, hx colon cancer s/p resection admitted with acute renal failure due to dehydration, hyponatremia.   Per chart patient is unable to provide any history in setting of dementia. She has been seen by our service on previous admissions. It appears on last admission in 03/2019 she was eating about 30% of her meals. She was on a dysphagia 3 diet. She appears weight-stable. Patient has already been ordered for Ensure. Patient is at risk for malnutrition.  Medications reviewed and include: vitamin D3 1000 units daily, senna-docusate 1 tablet daily, vitamin B12 1000 micrograms daily, NS at 100 mL/hr.  Labs reviewed: CO2 21, BUN 76, Creatinine 3.48.  NUTRITION - FOCUSED PHYSICAL EXAM:  Unable to complete at this time.  Diet Order:   Diet Order            Diet Heart Room service appropriate? Yes; Fluid consistency: Thin  Diet effective now             EDUCATION NEEDS:   No education needs have been identified at this time  Skin:  Skin Assessment: Reviewed RN Assessment  Last BM:  05/02/2019 - smear type 6  Height:   Ht Readings from Last 1 Encounters:  04/15/2019 5\' 4"  (1.626 m)   Weight:   Wt Readings from Last 1 Encounters:  04/06/2019 53.4 kg   Ideal Body Weight:  54.5 kg  BMI:  Body mass index is 20.21  kg/m.  Estimated Nutritional Needs:   Kcal:  1300-1500  Protein:  65-75 grams  Fluid:  1.3-1.5 L/day  Willey Blade, MS, RD, LDN Office: [phone] Pager: [phone] After Hours/Weekend Pager: [phone]

## 2019-05-02 NOTE — Consult Note (Signed)
[facility] CONSULT NOTE    Date: 05/02/2019                  Patient Name:  Emily [name]  MRN: [id number]  DOB: 1933-08-20  Age / Sex: 85 y.o., female         PCP: [name], MD                 Service Requesting Consult:  Hospitalist                 Reason for Consult:  Acute renal failure, chronic kidney disease stage III            History of Present Illness: Patient is a 85 y.o. female with a PMHx of colorectal cancer, diverticulosis, hemorrhoids, hypertension, osteopenia, osteoarthritis, who was admitted to [facility] on 04/17/2019 for evaluation of acute renal failure.  Patient is a nursing home resident and unable to offer significant history at the moment.  She was sent to the emergency department after having found to have an elevated creatinine.  Her baseline creatinine appears to be 1.0 with an EGFR 52.  Upon presentation creatinine was 3.59.  She has been started on IV fluid hydration with 0.9 normal saline at 100 cc/h.  Patient appears to be incontinent of urine at the moment.  Renal ultrasound was performed and was negative for hydronephrosis.  She was noted to be on spironolactone prior to admission.   Medications: Outpatient medications: Medications Prior to Admission  Medication Sig Dispense Refill Last Dose  . bisacodyl (DULCOLAX) 10 MG suppository Place 10 mg rectally as needed for moderate constipation.   prn at prn  . bisacodyl (DULCOLAX) 5 MG EC tablet Take 5 mg by mouth daily as needed for moderate constipation.   prn at prn  . Cholecalciferol (VITAMIN D-3) 1000 units CAPS Take 1,000 Units by mouth daily.    04/22/2019 at Unknown time  . cyanocobalamin 1000 MCG tablet Take 1,000 mcg by mouth daily.   04/04/2019 at Unknown time  . donepezil (ARICEPT) 5 MG tablet Take 1 tablet (5 mg total) by mouth at bedtime. 30 tablet 4 04/30/2019 at Unknown time  . lisinopril (PRINIVIL,ZESTRIL) 10 MG tablet One tablet daily (Patient taking differently:  Take 10 mg by mouth daily. ) 90 tablet 4 04/11/2019 at Unknown time  . metoCLOPramide (REGLAN) 5 MG tablet Take 5 mg by mouth 4 (four) times daily.   04/16/2019 at Unknown time  . metoprolol succinate (TOPROL-XL) 25 MG 24 hr tablet Take 0.5 tablets (12.5 mg total) by mouth daily. 45 tablet 3 04/29/2019 at Unknown time  . ondansetron (ZOFRAN) 4 MG tablet Take 4 mg by mouth every 6 (six) hours as needed for nausea or vomiting.   prn at prn  . sennosides-docusate sodium (SENOKOT-S) 8.6-50 MG tablet Take 1 tablet by mouth daily.   04/11/2019 at Unknown time  . spironolactone (ALDACTONE) 25 MG tablet Take 25 mg by mouth daily. Mondays,Wednesday, Fridays   04/25/2019 at Unknown time    Current medications: Current Facility-Administered Medications  Medication Dose Route Frequency Provider Last Rate Last Dose  . 0.9 %  sodium chloride infusion   Intravenous Continuous Demetrios Loll, MD 100 mL/hr at 05/02/19 0910    . acetaminophen (TYLENOL) tablet 650 mg  650 mg Oral Q6H PRN Demetrios Loll, MD       Or  . acetaminophen (TYLENOL) suppository 650 mg  650 mg Rectal Q6H PRN Demetrios Loll, MD      .  albuterol (PROVENTIL) (2.5 MG/3ML) 0.083% nebulizer solution 2.5 mg  2.5 mg Nebulization Q2H PRN Demetrios Loll, MD      . bisacodyl (DULCOLAX) EC tablet 5 mg  5 mg Oral Daily PRN Demetrios Loll, MD      . cholecalciferol (VITAMIN D3) tablet 1,000 Units  1,000 Units Oral Daily Demetrios Loll, MD   1,000 Units at 05/02/19 1133  . feeding supplement (ENSURE ENLIVE) (ENSURE ENLIVE) liquid 237 mL  237 mL Oral BID BM Demetrios Loll, MD   237 mL at 05/02/19 1500  . heparin injection 5,000 Units  5,000 Units Subcutaneous Q8H Demetrios Loll, MD   5,000 Units at 05/02/19 1612  . ondansetron (ZOFRAN) tablet 4 mg  4 mg Oral Q6H PRN Demetrios Loll, MD       Or  . ondansetron [facility]) injection 4 mg  4 mg Intravenous Q6H PRN Demetrios Loll, MD   4 mg at 05/02/19 0912  . senna-docusate (Senokot-S) tablet 1 tablet  1 tablet Oral QHS PRN Demetrios Loll, MD      .  senna-docusate (Senokot-S) tablet 1 tablet  1 tablet Oral Daily Demetrios Loll, MD   1 tablet at 05/02/19 1132  . vitamin B-12 (CYANOCOBALAMIN) tablet 1,000 mcg  1,000 mcg Oral Daily Demetrios Loll, MD   1,000 mcg at 05/02/19 1132   Facility-Administered Medications Ordered in Other Encounters  Medication Dose Route Frequency Provider Last Rate Last Dose  . heparin lock flush 100 unit/mL  500 Units Intravenous Once Charlaine Dalton R, MD      . sodium chloride flush (NS) 0.9 % injection 10 mL  10 mL Intravenous PRN Cammie Sickle, MD          Allergies: Allergies  Allergen Reactions  . Amlodipine Besylate Other (See Comments)    Patient unaware of any allergy with this medicine  . Oxycodone     confusion      Past Medical History: Past Medical History:  Diagnosis Date  . Arthritis    fingers  . Cancer ([place])    rectal  . Cancer ([place]) 2012   colon cancer  . Cancer of contiguous sites of hypopharynx ([place]) 12/23/2015  . Colon cancer ([place])   . Difficult intubation   . Diverticulosis   . Dyspnea    SOB  . Hemorrhoids   . History of chicken pox   . HOH (hard of hearing)   . Hypertension   . Osteopenia   . Status post chemotherapy    colon cancer  . Status post radiation therapy    colon cancer 2013     Past Surgical History: Past Surgical History:  Procedure Laterality Date  . ABDOMINAL HYSTERECTOMY    . ABDOMINAL HYSTERECTOMY  1980   partial  . APPENDECTOMY    . APPENDECTOMY  [id number]   Dr. Pat Patrick  . BREAST CYST ASPIRATION Bilateral    negative  . BREAST SURGERY     cyst removal  . BREAST SURGERY  1960   Breast Biopsy  . CARDIAC CATHETERIZATION N/A 06/23/2016   Procedure: Left Heart Cath and Coronary Angiography;  Surgeon: Corey Skains, MD;  Location: [facility];  Service: Cardiovascular;  Laterality: N/A;  . CATARACT EXTRACTION W/PHACO Left 06/01/2017   Procedure: CATARACT EXTRACTION PHACO AND INTRAOCULAR LENS PLACEMENT (IOC);  Surgeon: Birder Robson, MD;  Location: [facility];  Service: Ophthalmology;  Laterality: Left;  [place] 00:55 AP% 19.4 CDE 10.71 Fluid pack lot [id number]  . CATARACT EXTRACTION W/PHACO Right  07/06/2017   Procedure: CATARACT EXTRACTION PHACO AND INTRAOCULAR LENS PLACEMENT (IOC);  Surgeon: Birder Robson, MD;  Location: [facility];  Service: Ophthalmology;  Laterality: Right;  [place]  00:44.7 AP% 18.2 CDE 8.10 Fluid Pack lot # [id number]  . COLON SURGERY  06/28/2013   resected tumor from colon; [facility]  . COLONOSCOPY    . COLONOSCOPY N/A 10/01/2015   Procedure: COLONOSCOPY;  Surgeon: [name], MD;  Location[facility];  Service: Endoscopy;  Laterality: N/A;  . DILATION AND CURETTAGE OF UTERUS  1957  . DIRECT LARYNGOSCOPY N/A 12/11/2015   Procedure: DIRECT LARYNGOSCOPY;  Surgeon: Clyde Canterbury, MD;  Location: [facility];  Service: ENT;  Laterality: N/A;  . ESOPHAGOSCOPY N/A 12/11/2015   Procedure: ESOPHAGOSCOPY;  Surgeon: Clyde Canterbury, MD;  Location: [facility] OR[facility];  Service: ENT;  Laterality: N/A;  . EUS N/A 01/20/2013   Procedure: LOWER ENDOSCOPIC ULTRASOUND (EUS);  Surgeon: Milus Banister, MD;  Location: [facility];  Service: Endoscopy;  Laterality: N/A;  . JOINT REPLACEMENT    . PEG PLACEMENT N/A 01/01/2016   Procedure: PERCUTANEOUS ENDOSCOPIC GASTROSTOMY (PEG) PLACEMENT;  Surgeon: [name], MD;  Location: [facility];  Service: General;  Laterality: N/A;  . PORTACATH PLACEMENT Right 01/01/2016   Procedure: INSERTION PORT-A-CATH;  Surgeon: [name], MD;  Location: [facility] OR[facility];  Service: General;  Laterality: Right;  . TONSILLECTOMY    . TOTAL HIP ARTHROPLASTY Right 06/24/2016   Procedure: TOTAL HIP ARTHROPLASTY ANTERIOR APPROACH;  Surgeon: Hessie Knows, MD;  Location: [facility] ORS;  Service: Orthopedics;  Laterality: Right;     Family History: Family History  Problem Relation Age of Onset  . Kidney failure Father   . Breast cancer Maternal Grandmother      Social  History: Social History   Socioeconomic History  . Marital status: Unknown    Spouse name: Not on file  . Number of children: 3  . Years of education: Not on file  . Highest education level: Not on file  Occupational History  . Occupation: Unemployed    Comment: Homemaker  Social Needs  . Financial resource strain: Not hard at all  . Food insecurity    Worry: Never true    Inability: Never true  . Transportation needs    Medical: No    Non-medical: No  Tobacco Use  . Smoking status: Former Smoker    Packs/day: 1.00    Years: 64.00    Pack years: 64.00    Types: Cigarettes  . Smokeless tobacco: Never Used  Substance and Sexual Activity  . Alcohol use: Yes    Alcohol/week: 0.0 standard drinks    Comment: rarely  . Drug use: No  . Sexual activity: Not on file  Lifestyle  . Physical activity    Days per week: 0 days    Minutes per session: 0 min  . Stress: Not at all  Relationships  . Social Herbalist on phone: Twice a week    Gets together: Patient refused    Attends religious service: 1 to 4 times per year    Active member of club or organization: No    Attends meetings of clubs or organizations: Never    Relationship status: Widowed  . Intimate partner violence    Fear of current or ex partner: No    Emotionally abused: No    Physically abused: No    Forced sexual activity: No  Other Topics Concern  . Not on  file  Social History Narrative   ** Merged History Encounter **         Review of Systems: Patient unable to offer given altered mental status  Vital Signs: Blood pressure (!) 130/53, pulse 82, temperature (!) 97.5 F (36.4 C), temperature source Oral, resp. rate 18, height '5\' 4"'$  (1.626 m), weight 53.4 kg, SpO2 97 %.  Weight trends: Filed Weights   04/24/2019 1747 04/08/2019 2300  Weight: 52.8 kg 53.4 kg    Physical Exam: General: NAD, laying in bed  Head: Left eye ecchymosis noted  Eyes: Anicteric, EOMI  Nose: Mucous membranes  moist, not inflammed, nonerythematous.  Throat: Oropharynx nonerythematous, no exudate appreciated.   Neck: Supple, trachea midline.  Lungs:  Normal respiratory effort. Clear to auscultation BL without crackles or wheezes.  Heart: RRR. S1 and S2 normal without gallop, murmur, or rubs.  Abdomen:  BS normoactive. Soft, Nondistended, non-tender.  No masses or organomegaly.  Extremities: No pretibial edema.  Neurologic: Awake, conversant, but confused.  Skin: No visible rashes, scars.    Lab results: Basic Metabolic Panel: Recent Labs  Lab 04/11/2019 1822 05/02/19 0431  NA 134* 136  K 4.7 4.1  CL 97* 103  CO2 25 21*  GLUCOSE 94 79  BUN 74* 76*  CREATININE 3.59* 3.48*  CALCIUM 8.7* 8.0*    Liver Function Tests: Recent Labs  Lab 04/12/2019 1822  AST 28  ALT 16  ALKPHOS 607*  BILITOT 0.6  PROT 5.9*  ALBUMIN 2.7*   No results for input(s): LIPASE, AMYLASE in the last 168 hours. No results for input(s): AMMONIA in the last 168 hours.  CBC: Recent Labs  Lab 04/30/2019 1822 05/02/19 0431  WBC 10.4 8.2  HGB 14.1 11.9*  HCT 42.5 36.8  MCV 88.9 89.3  PLT 152 134*    Cardiac Enzymes: No results for input(s): CKTOTAL, CKMB, CKMBINDEX, TROPONINI in the last 168 hours.  BNP: Invalid input(s): POCBNP  CBG: No results for input(s): GLUCAP in the last 168 hours.  Microbiology: Results for orders placed or performed during the hospital encounter of 04/30/2019  SARS Coronavirus 2 (CEPHEID - Performed in [facility] hospital lab), Hosp Order     Status: None   Collection Time: 04/11/2019  8:37 PM   Specimen: Nasopharyngeal Swab  Result Value Ref Range Status   SARS Coronavirus 2 NEGATIVE NEGATIVE Final    Comment: (NOTE) If result is NEGATIVE SARS-CoV-2 target nucleic acids are NOT DETECTED. The SARS-CoV-2 RNA is generally detectable in upper and lower  respiratory specimens during the acute phase of infection. The lowest  concentration of SARS-CoV-2 viral copies this assay  can detect is 250  copies / mL. A negative result does not preclude SARS-CoV-2 infection  and should not be used as the sole basis for treatment or other  patient management decisions.  A negative result may occur with  improper specimen collection / handling, submission of specimen other  than nasopharyngeal swab, presence of viral mutation(s) within the  areas targeted by this assay, and inadequate number of viral copies  (<250 copies / mL). A negative result must be combined with clinical  observations, patient history, and epidemiological information. If result is POSITIVE SARS-CoV-2 target nucleic acids are DETECTED. The SARS-CoV-2 RNA is generally detectable in upper and lower  respiratory specimens dur ing the acute phase of infection.  Positive  results are indicative of active infection with SARS-CoV-2.  Clinical  correlation with patient history and other diagnostic information is  necessary to  determine patient infection status.  Positive results do  not rule out bacterial infection or co-infection with other viruses. If result is PRESUMPTIVE POSTIVE SARS-CoV-2 nucleic acids MAY BE PRESENT.   A presumptive positive result was obtained on the submitted specimen  and confirmed on repeat testing.  While 2019 novel coronavirus  (SARS-CoV-2) nucleic acids may be present in the submitted sample  additional confirmatory testing may be necessary for epidemiological  and / or clinical management purposes  to differentiate between  SARS-CoV-2 and other Sarbecovirus currently known to infect humans.  If clinically indicated additional testing with an alternate test  methodology [phone]) is advised. The SARS-CoV-2 RNA is generally  detectable in upper and lower respiratory sp ecimens during the acute  phase of infection. The expected result is Negative. Fact Sheet for Patients:  StrictlyIdeas.no Fact Sheet for Healthcare  Providers: BankingDealers.co.za This test is not yet approved or cleared by the [place] FDA and has been authorized for detection and/or diagnosis of SARS-CoV-2 by FDA under an Emergency Use Authorization (EUA).  This EUA will remain in effect (meaning this test can be used) for the duration of the COVID-19 declaration under Section 564(b)(1) of the Act, 21 U.S.C. section 360bbb-3(b)(1), unless the authorization is terminated or revoked sooner. Performed at [facility], [address] [place], [place] [address]     Coagulation Studies: No results for input(s): LABPROT, INR in the last 72 hours.  Urinalysis: No results for input(s): COLORURINE, LABSPEC, PHURINE, GLUCOSEU, HGBUR, BILIRUBINUR, KETONESUR, PROTEINUR, UROBILINOGEN, NITRITE, LEUKOCYTESUR in the last 72 hours.  Invalid input(s): APPERANCEUR    Imaging: US Renal  Result Date: 05/02/2019 CLINICAL DATA:  Acute renal failure, history colon cancer, hypertension, hypopharyngeal cancer EXAM: RENAL / URINARY TRACT ULTRASOUND COMPLETE COMPARISON:  PET-CT 05/14/2016 FINDINGS: Right Kidney: Renal measurements: 9.8 x 4.5 x 5.3 cm = volume: 121 mL. Normal cortical thickness. Increased cortical echogenicity. Minimal fullness of renal pelvis without caliectasis. Tiny exophytic nodule 7 x 9 x 9 mm, hypoechoic, questionably tiny cyst, not identified by prior PET-CT. No additional masses. No shadowing calcifications. Left Kidney: Renal measurements: 9.3 x 4.4 x 4.9 cm = volume: 105 mL. Normal cortical thickness. Increased cortical echogenicity. Minimal collecting system dilatation. Tiny cyst 19 x 14 x 14 mm inferiorly, has a single septation. This is not definitely localized on the prior PET-CT. No additional masses or shadowing calcifications. Bladder: Partially distended, unremarkable. IMPRESSION: Medical renal disease changes of both kidneys. Minimal collecting system dilatation bilaterally. Tiny BILATERAL  nodules question cysts, with the cyst at the inferior pole LEFT kidney 19 mm in largest size and containing a septation. Electronically Signed   By: Lavonia Dana M.D.   On: 05/02/2019 10:41      Assessment & Plan: Pt is a 85 y.o. female with a PMHx of colorectal cancer, diverticulosis, hemorrhoids, hypertension, osteopenia, osteoarthritis, who was admitted to [facility] on 04/12/2019 for evaluation of acute renal failure.   1.  Acute renal failure/chronic kidney disease stage III baseline creatinine 1.0 with an EGFR 52/Proteinuria.  Suspect that patient developed volume depletion.  She was on spinal lactone prior to admission.  Continue IV fluid hydration with 0.9 normal saline at 100 cc/h.  Proceed with additional work-up including SPEP, UPEP, ANA, ANCA abs, GBM abs, and urine protein to creatinine ratio.  Renal ultrasound was performed and was negative for hydronephrosis.  No urgent indication for dialysis at the moment.  2.  Hypoalbuminemia.  Albumin noted to be low at 2.7.  She  has been started on protein supplementation.  Continue to monitor nutritional status.

## 2019-05-02 NOTE — Progress Notes (Signed)
[place] [facility] [place] NAME: Emily [name]    MR#:  [id number]  DATE OF BIRTH:  1932/11/30  SUBJECTIVE:  CHIEF COMPLAINT:   Chief Complaint  Patient presents with  . Abnormal Lab   No new complaint this morning.  No fevers.  Patient pleasantly confused.  REVIEW OF SYSTEMS:  ROS unobtainable due to patient's mental status  DRUG ALLERGIES:   Allergies  Allergen Reactions  . Amlodipine Besylate Other (See Comments)    Patient unaware of any allergy with this medicine  . Oxycodone     confusion   VITALS:  Blood pressure (!) 130/53, pulse 82, temperature (!) 97.5 F (36.4 C), temperature source Oral, resp. rate 18, height 5\' 4"  (1.626 m), weight 53.4 kg, SpO2 97 %. PHYSICAL EXAMINATION:  Physical Exam   GENERAL:  85 y.o.-year-old patient lying in the bed with no acute distress.  EYES: Pupils equal, round, reactive to light and accommodation. No scleral icterus. Extraocular muscles intact.  HEENT: Head atraumatic, normocephalic. Oropharynx and nasopharynx clear.  NECK:  Supple, no jugular venous distention. No thyroid enlargement, no tenderness.  LUNGS: Normal breath sounds bilaterally, no wheezing, rales,rhonchi or crepitation. No use of accessory muscles of respiration.  CARDIOVASCULAR: S1, S2 normal. No murmurs, rubs, or gallops.  ABDOMEN: Soft, nontender, nondistended. Bowel sounds present. No organomegaly or mass.  EXTREMITIES: No pedal edema, cyanosis, or clubbing.  NEUROLOGIC: Unable to exam.  Patient pleasantly confused. PSYCHIATRIC: The patient is demented.  LABORATORY PANEL:  Female CBC Recent Labs  Lab 05/02/19 0431  WBC 8.2  HGB 11.9*  HCT 36.8  PLT 134*   ------------------------------------------------------------------------------------------------------------------ Chemistries  Recent Labs  Lab 04/30/2019 1822 05/02/19 0431  NA 134* 136  K 4.7 4.1  CL 97* 103  CO2 25 21*  GLUCOSE 94 79  BUN 74* 76*   CREATININE 3.59* 3.48*  CALCIUM 8.7* 8.0*  AST 28  --   ALT 16  --   ALKPHOS 607*  --   BILITOT 0.6  --    RADIOLOGY:  US Renal  Result Date: 05/02/2019 CLINICAL DATA:  Acute renal failure, history colon cancer, hypertension, hypopharyngeal cancer EXAM: RENAL / URINARY TRACT ULTRASOUND COMPLETE COMPARISON:  PET-CT 05/14/2016 FINDINGS: Right Kidney: Renal measurements: 9.8 x 4.5 x 5.3 cm = volume: 121 mL. Normal cortical thickness. Increased cortical echogenicity. Minimal fullness of renal pelvis without caliectasis. Tiny exophytic nodule 7 x 9 x 9 mm, hypoechoic, questionably tiny cyst, not identified by prior PET-CT. No additional masses. No shadowing calcifications. Left Kidney: Renal measurements: 9.3 x 4.4 x 4.9 cm = volume: 105 mL. Normal cortical thickness. Increased cortical echogenicity. Minimal collecting system dilatation. Tiny cyst 19 x 14 x 14 mm inferiorly, has a single septation. This is not definitely localized on the prior PET-CT. No additional masses or shadowing calcifications. Bladder: Partially distended, unremarkable. IMPRESSION: Medical renal disease changes of both kidneys. Minimal collecting system dilatation bilaterally. Tiny BILATERAL nodules question cysts, with the cyst at the inferior pole LEFT kidney 19 mm in largest size and containing a septation. Electronically Signed   By: Lavonia Dana M.D.   On: 05/02/2019 10:41   ASSESSMENT AND PLAN:   1. Acute kidney injury  Felt to be secondary to dehydration.   Being hydrated with IV fluids with gradual improvement in renal function.  Follow-up on renal ultrasound.   Lisinopril placed on hold.  Requested for nephrology consultation.    2. Hyponatremia.   Likely due to  dehydration.  Being hydrated with IV fluids.  Sodium level improved from 134-136.  3. Hypertension.  Hold lisinopril and Lopressor due to low side blood pressure.  4. Dementia.  Aspiration and fall precaution Stable.  Patient pleasantly confused. Patient  noted to be currently being followed by outpatient palliative at [name].  DVT prophylaxis; heparin   All the records are reviewed and case discussed with Care Management/Social Worker. Called and discussed with patient's son; Mr. [name] over the phone.  Updated him on treatment plans as outlined above and all questions were answered.  He is in agreement with the plan of care.  CODE STATUS: DNR  TOTAL TIME TAKING CARE OF THIS PATIENT: 36 minutes.   More than 50% of the time was spent in counseling/coordination of care: YES  POSSIBLE D/C IN 2 DAYS, DEPENDING ON CLINICAL CONDITION.   Charmin Aguiniga M.D on 05/02/2019 at 11:11 AM  Between 7am to 6pm - Pager - [phone]  After 6pm go to [email or web address] - Proofreader  [facility]  Office  [phone]  CC: Primary care physician; [name], MD  Note: This dictation was prepared with Dragon dictation along with smaller phrase technology. Any transcriptional errors that result from this process are unintentional.

## 2019-05-02 NOTE — Plan of Care (Signed)
Pt confused. Alert to self. Follow commands. C/o nausea. Zofran given once with improvement. Poor appetite. Pt did not eat anything today, had sips of water and ensure. Sleeping between care. Pt have strong congested non productive cough.

## 2019-05-02 NOTE — TOC Initial Note (Signed)
Transition of Care [facility]) - Initial/Assessment Note    Patient Details  Name: Emily [name] MRN: [id number] Date of Birth: 09-11-1933  Transition of Care [facility]) CM/SW Contact:    Shade Flood, LCSW Phone Number: 05/02/2019, 3:30 PM  Clinical Narrative:                  Pt admitted from [facility] where she has been in short term rehab for around 20 days. Pt with some dementia. Attempted to reach Tiro at [facility] to update. Had to leave HIPPA compliant Voicemail requesting return call.   Spoke with pt's son by phone to discuss pt. He states that pt was scheduled to dc from [facility] to [facility] once her COVID test came back and then she ended up in the hospital. Pt's son is not sure what direction pt will go at dc. He states that if she still needs rehab, they may not want to go back to [facility]. If pt can go to ALF and her COVID is back, they would look at that though he is concerned she may need more help than ALF can provide. Son states that if pt is felt to be "terminal" then he would like to take her back to his home with hospice.  Santiago Glad from [employer] informs LCSW that pt is followed by outpatient Palliative Care at [facility] and they will continue to follow at dc. If pt needs hospice at dc, they can provide for this need.  TOC will follow and continue to work with pt's family on dc planning needs.  Expected Discharge Plan: Long Term Nursing Home Barriers to Discharge: Continued Medical Work up   Patient Goals and CMS Choice        Expected Discharge Plan and Services Expected Discharge Plan: [place]       Living arrangements for the past 2 months: [place] Expected Discharge Date: 05/03/2019                                    Prior Living Arrangements/Services Living arrangements for the past 2 months: [place] Lives with:: Facility Resident Patient language and need for  interpreter reviewed:: Yes        Need for Family Participation in Patient Care: No (Comment) Care giver support system in place?: Yes (comment)   Criminal Activity/Legal Involvement Pertinent to Current Situation/Hospitalization: No - Comment as needed  Activities of Daily Living Home Assistive Devices/Equipment: Environmental consultant (specify type) ADL Screening (condition at time of admission) Independently performs ADLs?: No Communication: Needs assistance Is this a change from baseline?: Pre-admission baseline Dressing (OT): Needs assistance Is this a change from baseline?: Pre-admission baseline Grooming: Needs assistance Is this a change from baseline?: Pre-admission baseline Feeding: Independent Bathing: Needs assistance Is this a change from baseline?: Pre-admission baseline Toileting: Needs assistance Is this a change from baseline?: Pre-admission baseline In/Out Bed: Needs assistance Is this a change from baseline?: Pre-admission baseline Walks in Home: Independent with device (comment) Does the patient have difficulty walking or climbing stairs?: Yes Weakness of Legs: Both Weakness of Arms/Hands: None  Permission Sought/Granted                  Emotional Assessment Appearance:: Appears stated age Attitude/Demeanor/Rapport: Unable to Assess Affect (typically observed): Unable to Assess Orientation: : Oriented to Self Alcohol / Substance Use: Not Applicable Psych Involvement:  No (comment)  Admission diagnosis:  Acute renal failure, unspecified acute renal failure type [facility]) [N17.9] Patient Active Problem List   Diagnosis Date Noted  . ARF (acute renal failure) ([place]) 04/14/2019  . Pressure injury of skin 04/01/2019  . UTI (urinary tract infection) 03/31/2019  . Acute on chronic renal failure ([place]) 03/31/2019  . 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 Systolic dysfunction 16/08/9603  . Malnutrition of moderate degree 07/21/2016  . Lower GI bleed 07/18/2016  . History  of non-ST elevation myocardial infarction (NSTEMI) 06/21/2016  . Closed right hip fracture ([place]) 06/21/2016  . Ischemic chest pain ([place]) 06/21/2016  . 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 Cancer of contiguous sites of hypopharynx ([place]) 12/23/2015  . History of colon cancer 05/09/2015  . Diverticulosis 05/09/2015  . Fatigue 05/09/2015  . Hemorrhoid 05/09/2015  . Hypertension 05/09/2015  . Insomnia 05/09/2015  . Osteopenia 05/09/2015  . Skin lesion of back 05/09/2015  . Tobacco abuse 05/09/2015  . Vitamin D deficiency 05/09/2015  . Acid reflux 06/13/2013  . Arthritis, degenerative 06/13/2013  . Disuse syndrome 06/13/2013  . Rectal cancer ([place]) 01/20/2013   PCP:  [name], MD Pharmacy:   [facility] [address], [place] [address] Phone: [phone] Fax: [phone]  [id number] - [place], [place] [address] [place] [address] Phone: [phone] Fax: [phone]     Social Determinants of Health (SDOH) Interventions    Readmission Risk Interventions No flowsheet data found.

## 2019-05-03 ENCOUNTER — Inpatient Hospital Stay: Payer: Medicare Other

## 2019-05-03 LAB — CBC
HCT: 37.8 % (ref 36.0–46.0)
Hemoglobin: 12 g/dL (ref 12.0–15.0)
MCH: 29.1 pg (ref 26.0–34.0)
MCHC: 31.7 g/dL (ref 30.0–36.0)
MCV: 91.7 fL (ref 80.0–100.0)
Platelets: 151 10*3/uL (ref 150–400)
RBC: 4.12 MIL/uL (ref 3.87–5.11)
RDW: 14.8 % (ref 11.5–15.5)
WBC: 6.9 10*3/uL (ref 4.0–10.5)
nRBC: 0 % (ref 0.0–0.2)

## 2019-05-03 LAB — BASIC METABOLIC PANEL
Anion gap: 14 (ref 5–15)
BUN: 70 mg/dL — ABNORMAL HIGH (ref 8–23)
CO2: 18 mmol/L — ABNORMAL LOW (ref 22–32)
Calcium: 8.3 mg/dL — ABNORMAL LOW (ref 8.9–10.3)
Chloride: 108 mmol/L (ref 98–111)
Creatinine, Ser: 2.67 mg/dL — ABNORMAL HIGH (ref 0.44–1.00)
GFR calc Af Amer: 18 mL/min — ABNORMAL LOW (ref >60)
GFR calc non Af Amer: 16 mL/min — ABNORMAL LOW (ref >60)
Glucose, Bld: 93 mg/dL (ref 70–99)
Potassium: 3.7 mmol/L (ref 3.5–5.1)
Sodium: 140 mmol/L (ref 135–145)

## 2019-05-03 LAB — PHOSPHORUS: Phosphorus: 3 mg/dL (ref 2.5–4.6)

## 2019-05-03 LAB — MAGNESIUM: Magnesium: 1.8 mg/dL (ref 1.7–2.4)

## 2019-05-03 NOTE — TOC Progression Note (Signed)
Transition of Care [facility]) - Progression Note    Patient Details  Name: Emily [name] MRN: [id number] Date of Birth: 27-Sep-1933  Transition of Care [facility]) CM/SW Contact  Laverne Hursey, Lenice Llamas Phone Number: [phone]  05/03/2019, 12:16 PM  Clinical Narrative:   PT is recommending SNF. Clinical Social Worker (CSW) contacted patient's son Emily [name] and made him aware of above. CSW explained to Emily [name] that patient will not be able to go to [facility] because she needs more care at this time. Per son he wants to talk with the doctor first before he makes a decision about a SNF. Per son if patient is terminal then he wants to take her home with hospice. CSW explained to son that hospice at home will not provide 24/7 care. Per son he will hire private duty caregivers if patient comes home. MD aware of above. CSW will continue to follow and assist as needed.       Expected Discharge Plan: Long Term Nursing Home Barriers to Discharge: Continued Medical Work up  Expected Discharge Plan and Services Expected Discharge Plan: [place]       Living arrangements for the past 2 months: [place] Expected Discharge Date: 05/03/2019                                     Social Determinants of Health (SDOH) Interventions    Readmission Risk Interventions Readmission Risk Prevention Plan 05/02/2019 05/02/2019  Transportation Screening - Complete  HRI or [place] - Not Complete  HRI or Home Care Consult comments Pt came from SNF rehab. She may return there at dc vs. ALF vs. home with hospice Pt resides at [facility] in [place] for [place] Planning/Counseling - Complete  Some recent data might be hidden

## 2019-05-03 NOTE — Plan of Care (Signed)
Pt has congested cough in the upper respiratory tract, non productive. Unable to eat, sips of water. Abdomen distended. Dr Stark Jock made aware. Seen by SLP and made NPO. GI consult pending.

## 2019-05-03 NOTE — Evaluation (Signed)
Clinical/Bedside Swallow Evaluation Patient Details  Name: Emily [name] MRN: [id number] Date of Birth: Jan 05, 1933  Today's Date: 05/03/2019 Time: SLP Start Time (ACUTE ONLY): 1440 SLP Stop Time (ACUTE ONLY): 6222 SLP Time Calculation (min) (ACUTE ONLY): 55 min  Past Medical History:  Past Medical History:  Diagnosis Date  . Arthritis    fingers  . Cancer ([place])    rectal  . Cancer ([place]) 2012   colon cancer  . Cancer of contiguous sites of hypopharynx ([place]) 12/23/2015  . Colon cancer ([place])   . Difficult intubation   . Diverticulosis   . Dyspnea    SOB  . Hemorrhoids   . History of chicken pox   . HOH (hard of hearing)   . Hypertension   . Osteopenia   . Status post chemotherapy    colon cancer  . Status post radiation therapy    colon cancer 2013   Past Surgical History:  Past Surgical History:  Procedure Laterality Date  . ABDOMINAL HYSTERECTOMY    . ABDOMINAL HYSTERECTOMY  1980   partial  . APPENDECTOMY    . APPENDECTOMY  [id number]   Dr. Pat Patrick  . BREAST CYST ASPIRATION Bilateral    negative  . BREAST SURGERY     cyst removal  . BREAST SURGERY  1960   Breast Biopsy  . CARDIAC CATHETERIZATION N/A 06/23/2016   Procedure: Left Heart Cath and Coronary Angiography;  Surgeon: Corey Skains, MD;  Location: [place] CV LAB;  Service: Cardiovascular;  Laterality: N/A;  . CATARACT EXTRACTION W/PHACO Left 06/01/2017   Procedure: CATARACT EXTRACTION PHACO AND INTRAOCULAR LENS PLACEMENT (IOC);  Surgeon: Birder Robson, MD;  Location: [facility] ORS;  Service: Ophthalmology;  Laterality: Left;  [place] 00:55 AP% 19.4 CDE 10.71 Fluid pack lot 3 [id number]  . CATARACT EXTRACTION W/PHACO Right 07/06/2017   Procedure: CATARACT EXTRACTION PHACO AND INTRAOCULAR LENS PLACEMENT (IOC);  Surgeon: Birder Robson, MD;  Location: [facility] ORS;  Service: Ophthalmology;  Laterality: Right;  [place]  00:44.7 AP% 18.2 CDE 8.10 Fluid Pack lot # [id number]  . COLON SURGERY  06/28/2013   resected tumor  from colon; [facility]  . COLONOSCOPY    . COLONOSCOPY N/A 10/01/2015   Procedure: COLONOSCOPY;  Surgeon: [name], MD;  Location: [facility] ENDOSCOPY;  Service: Endoscopy;  Laterality: N/A;  . DILATION AND CURETTAGE OF UTERUS  1957  . DIRECT LARYNGOSCOPY N/A 12/11/2015   Procedure: DIRECT LARYNGOSCOPY;  Surgeon: Clyde Canterbury, MD;  Location: [facility] ORS;  Service: ENT;  Laterality: N/A;  . ESOPHAGOSCOPY N/A 12/11/2015   Procedure: ESOPHAGOSCOPY;  Surgeon: Clyde Canterbury, MD;  Location: [facility] ORS;  Service: ENT;  Laterality: N/A;  . EUS N/A 01/20/2013   Procedure: LOWER ENDOSCOPIC ULTRASOUND (EUS);  Surgeon: Milus Banister, MD;  Location: [facility];  Service: Endoscopy;  Laterality: N/A;  . JOINT REPLACEMENT    . PEG PLACEMENT N/A 01/01/2016   Procedure: PERCUTANEOUS ENDOSCOPIC GASTROSTOMY (PEG) PLACEMENT;  Surgeon: [name], MD;  Location: [facility] ORS;  Service: General;  Laterality: N/A;  . PORTACATH PLACEMENT Right 01/01/2016   Procedure: INSERTION PORT-A-CATH;  Surgeon: [name], MD;  Location: [facility] ORS;  Service: General;  Laterality: Right;  . TONSILLECTOMY    . TOTAL HIP ARTHROPLASTY Right 06/24/2016   Procedure: TOTAL HIP ARTHROPLASTY ANTERIOR APPROACH;  Surgeon: Hessie Knows, MD;  Location: [facility] ORS;  Service: Orthopedics;  Laterality: Right;   HPI:  Emily [name] is a 85 y.o. female with a known history of multiple  medical history including Cancer of hypopharynx and colon Ca w/ tx ~2013.  Recently, Emily [name] has had medical issues of Constipation w/ nausea w/ decreased appetite but has been eating/drinking - noted chart notes indicating a Colonoscopy(?).  Emily [name] had been eating Magic Cups w/ weight gain and getting around w/ a walker w/ cues - all reported by [facility] per chart notes.  Emily [name]/Son had desired Emily [name] to transfer to an Assisted Living facility.  The patient is currently sent from nursing home to ED due to abnormal lab with elevated creatinine.    Assessment / Plan /  Recommendation Clinical Impression  Emily [name] was alert and engaged appropriately w/ SLP and NSG. She followed general instructions and answered questions re: self appropriately. She shared she was from [place] but was not clear about how long she had not felt well.  Emily [name] appears to present w/ No gross oropharyngeal phase deficits w/ no immediate, overt clinical s/s of aspiration noted. However, Emily [name] exhibited significant suspected Esophageal phase deficits c/b continuous expectoration of white, frothy phlegm and gagging/coughing on such post po trials of liquid and puree consistencies. Also w/ the po few po trials given, Emily [name] reported significant discomfort holding her LOWER abdomen. Vocal quality was clear immeidate post po trials w/ no immediate decline in respirations but as the coughing and expectoration of phlegm continued, Emily [name] became SOB and needed a rest break post trials.  Consulted MD/NSG post BSE w/ results and recommended NPO status including medications. Recommended a GI consult for further assessment and dx - Emily [name] had been eating an oral diet w/ NO deficits reported except Constipation and Nausea, per chart notes at [facility]. SLP Visit Diagnosis: Dysphagia, pharyngoesophageal phase (R13.14)(GI deficits)    Aspiration Risk  Mild aspiration risk;Risk for inadequate nutrition/hydration(d/t GI issues)    Diet Recommendation  NPO status w/ frequent oral care for hygiene and stimulation of swallowing  Medication Administration: Via alternative means    Other  Recommendations Recommended Consults: Consider GI evaluation;Consider esophageal assessment(Dietician f/u; Palliative Care f/u) Oral Care Recommendations: Oral care QID;Staff/trained caregiver to provide oral care   Follow up Recommendations (TBD)      Frequency and Duration (TBD)  (TBD)       Prognosis Prognosis for Safe Diet Advancement: Guarded Barriers to Reach Goals: Severity of deficits(GI deficits)      Swallow Study   General Date  of Onset: 04/23/2019 HPI: Emily [name] is a 85 y.o. female with a known history of multiple medical history including Cancer of hypopharynx and colon Ca w/ tx ~2013.  Recently, Emily [name] has had medical issues of Constipation w/ nausea w/ decreased appetite but has been eating/drinking - noted chart notes indicating a Colonoscopy(?).  Emily [name] had been eating Magic Cups w/ weight gain and getting around w/ a walker w/ cues - all reported by [facility] per chart notes.  Emily [name]/Son had desired Emily [name] to transfer to an Assisted Living facility.  The patient is currently sent from nursing home to ED due to abnormal lab with elevated creatinine.  Type of Study: Bedside Swallow Evaluation Previous Swallow Assessment: none reported Diet Prior to this Study: Dysphagia 3 (soft);Thin liquids(ordered at admit) Temperature Spikes Noted: No(wbc 6.9) Respiratory Status: Room air History of Recent Intubation: No Behavior/Cognition: Alert;Cooperative;Pleasant mood(HOH) Oral Cavity Assessment: Dry;Excessive secretions(white, bubbly phelgm) Oral Care Completed by SLP: Yes Oral Cavity - Dentition: Missing dentition(not wearing upper denture plate) Vision: Functional for self-feeding Self-Feeding Abilities: Able to feed self;Needs assist;Needs set up;Total assist(weak) Patient Positioning: Upright in bed(needed  positioning) Baseline Vocal Quality: Normal;Low vocal intensity Volitional Cough: Congested;Strong Volitional Swallow: Able to elicit    Oral/Motor/Sensory Function Overall Oral Motor/Sensory Function: Within functional limits   Ice Chips Ice chips: Within functional limits Presentation: Spoon(fed; 3 trials)   Thin Liquid Thin Liquid: Within functional limits(appeared grossly wfl) Presentation: Cup;Self Fed;Straw(~2-3 ozs total) Other Comments: Emily [name] did have a congested cough to expectorate white, frothy phlegm post sips of liquids x2 episodes    Nectar Thick Nectar Thick Liquid: Not tested   Honey Thick Honey Thick Liquid: Not  tested   Puree Puree: Within functional limits(appeared grossly wfl) Presentation: Spoon(fed; 4 trials) Other Comments: similar results as w/ liquids   Solid     Solid: Not tested       Orinda Kenner, MS, CCC-SLP , 05/03/2019,5:03 PM

## 2019-05-03 NOTE — Progress Notes (Signed)
[place] [facility] [place] NAME: Emily [name]    MR#:  [id number]  DATE OF BIRTH:  1933-08-10  SUBJECTIVE:  CHIEF COMPLAINT:   Chief Complaint  Patient presents with  . Abnormal Lab   Nursing staff reported noticing patient having some cough this morning especially while drinking.   No fevers.  Patient pleasantly confused but patient appears more awake and alert this morning.  REVIEW OF SYSTEMS:  ROS unobtainable due to patient's mental status  DRUG ALLERGIES:   Allergies  Allergen Reactions  . Amlodipine Besylate Other (See Comments)    Patient unaware of any allergy with this medicine  . Oxycodone     confusion   VITALS:  Blood pressure (!) 119/52, pulse 89, temperature 98.3 F (36.8 C), temperature source Oral, resp. rate 18, height 5\' 4"  (1.626 m), weight 53.4 kg, SpO2 95 %. PHYSICAL EXAMINATION:  Physical Exam   GENERAL:  85 y.o.-year-old patient lying in the bed with no acute distress.  EYES: Pupils equal, round, reactive to light and accommodation. No scleral icterus. Extraocular muscles intact.  HEENT: Head atraumatic, normocephalic. Oropharynx and nasopharynx clear.  NECK:  Supple, no jugular venous distention. No thyroid enlargement, no tenderness.  LUNGS: Normal breath sounds bilaterally, no wheezing, rales,rhonchi or crepitation. No use of accessory muscles of respiration.  CARDIOVASCULAR: S1, S2 normal. No murmurs, rubs, or gallops.  ABDOMEN: Soft, nontender, nondistended. Bowel sounds present. No organomegaly or mass.  EXTREMITIES: No pedal edema, cyanosis, or clubbing.  NEUROLOGIC: Unable to exam.  Patient pleasantly confused. PSYCHIATRIC: The patient is demented.  LABORATORY PANEL:  Female CBC Recent Labs  Lab 05/03/19 0556  WBC 6.9  HGB 12.0  HCT 37.8  PLT 151   ------------------------------------------------------------------------------------------------------------------ Chemistries  Recent Labs   Lab 04/29/2019 1822  05/03/19 0556  NA 134*   < > 140  K 4.7   < > 3.7  CL 97*   < > 108  CO2 25   < > 18*  GLUCOSE 94   < > 93  BUN 74*   < > 70*  CREATININE 3.59*   < > 2.67*  CALCIUM 8.7*   < > 8.3*  MG  --   --  1.8  AST 28  --   --   ALT 16  --   --   ALKPHOS 607*  --   --   BILITOT 0.6  --   --    < > = values in this interval not displayed.   RADIOLOGY:  Dg Chest 1 View  Result Date: 05/03/2019 CLINICAL DATA:  Cough EXAM: CHEST  1 VIEW COMPARISON:  07/18/2016 FINDINGS: Previous right power port has been removed. Heart size is normal. Chronic aortic atherosclerosis. Chronic interstitial lung markings. Question mild patchy infiltrate in the left base. No dense consolidation or lobar collapse. No effusion. No acute bone finding. IMPRESSION: Chronic interstitial lung markings. Question slight increase in patchy density at the left base which could be atelectasis or mild pneumonia. Electronically Signed   By: Nelson Chimes M.D.   On: 05/03/2019 09:25   ASSESSMENT AND PLAN:   1. Acute kidney injury  Felt to be secondary to dehydration.   Being hydrated with IV fluids with gradual improvement in renal function.  Renal ultrasound with evidence of medical renal disease.  Minimal collecting system dilatation bilaterally.Lisinopril placed on hold.   Appreciate nephrology input  2. Hyponatremia.   Likely due to dehydration.  Resolved with IV  fluid hydration  3. Hypertension.  Hold lisinopril and Lopressor due to low side blood pressure.  Consider resuming Lopressor if blood pressure begins to trend up  4. Dementia.  Aspiration and fall precaution Stable.  Patient pleasantly confused. Patient noted to be currently being followed by [facility] at [facility].  5.  Suspected dysphagia Nursing staff reported coughing while swallowing. *Chest x-ray showed chronic interstitial lung markings.  Increasing patchy density in the left base which could be atelectasis or mild  pneumonia.  Clinically no fevers.  No leukocytosis.  No clinical evidence to support pneumonia. Speech therapy evaluation was requested.  DVT prophylaxis; heparin   All the records are reviewed and case discussed with Care Management/Social Worker. Called and discussed with patient's son; Mr. [name] over the phone.  Updated him on treatment plans as outlined above and all questions were answered. He will consider discharging patient home with hospice if patient condition continues to decline and if renal function worsens.  Renal function currently appears to be improving with IV fluids.  Requested for palliative consult  CODE STATUS: DNR  TOTAL TIME TAKING CARE OF THIS PATIENT: 37 minutes.   More than 50% of the time was spent in counseling/coordination of care: YES  POSSIBLE D/C IN 2 DAYS, DEPENDING ON CLINICAL CONDITION.   Madalyne Husk M.D on 05/03/2019 at 12:28 PM  Between 7am to 6pm - Pager - [phone]  After 6pm go to [email or web address] - Proofreader  [facility]  Office  [phone]  CC: Primary care physician; [name], MD  Note: This dictation was prepared with Dragon dictation along with smaller phrase technology. Any transcriptional errors that result from this process are unintentional.

## 2019-05-03 NOTE — NC FL2 (Signed)
[place] LEVEL OF CARE SCREENING TOOL     IDENTIFICATION  Patient Name: Emily [name] Birthdate: 1933-04-10 Sex: female Admission Date (Current Location): 04/22/2019  [place] and [place] Number:  Engineering geologist and Address:  [facility], [address] [place], [place] [address]      Provider Number: [id number]  Attending Physician Name and Address:  Otila Back, MD  Relative Name and Phone Number:       Current Level of Care: Hospital Recommended Level of Care: [place] Prior Approval Number:    Date Approved/Denied:   PASRR Number: [id number]  Discharge Plan: SNF    Current Diagnoses: Patient Active Problem List   Diagnosis Date Noted  . ARF (acute renal failure) ([place]) 04/13/2019  . Pressure injury of skin 04/01/2019  . UTI (urinary tract infection) 03/31/2019  . Acute on chronic renal failure ([place]) 03/31/2019  . 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 Systolic dysfunction 43/15/4008  . Malnutrition of moderate degree 07/21/2016  . Lower GI bleed 07/18/2016  . History of non-ST elevation myocardial infarction (NSTEMI) 06/21/2016  . Closed right hip fracture ([place]) 06/21/2016  . Ischemic chest pain ([place]) 06/21/2016  . 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 Cancer of contiguous sites of hypopharynx ([place]) 12/23/2015  . History of colon cancer 05/09/2015  . Diverticulosis 05/09/2015  . Fatigue 05/09/2015  . Hemorrhoid 05/09/2015  . Hypertension 05/09/2015  . Insomnia 05/09/2015  . Osteopenia 05/09/2015  . Skin lesion of back 05/09/2015  . Tobacco abuse 05/09/2015  . Vitamin D deficiency 05/09/2015  . Acid reflux 06/13/2013  . Arthritis, degenerative 06/13/2013  . Disuse syndrome 06/13/2013  . Rectal cancer ([place]) 01/20/2013    Orientation RESPIRATION BLADDER Height & Weight     Self, Place  Normal Continent Weight: 117 lb 11.6 oz (53.4 kg) Height:  5\' 4"  (162.6 cm)  BEHAVIORAL  SYMPTOMS/MOOD NEUROLOGICAL BOWEL NUTRITION STATUS      Incontinent Diet(Diet DYS 3.)  AMBULATORY STATUS COMMUNICATION OF NEEDS Skin   Extensive Assist Verbally Normal                       Personal Care Assistance Level of Assistance  Bathing, Feeding, Dressing Bathing Assistance: Limited assistance Feeding assistance: Limited assistance Dressing Assistance: Limited assistance     Functional Limitations Info  Sight, Hearing, Speech Sight Info: Adequate Hearing Info: Adequate Speech Info: Adequate    SPECIAL CARE FACTORS FREQUENCY  PT (By licensed PT), OT (By licensed OT)     PT Frequency: 5 OT Frequency: 5            Contractures      Additional Factors Info  Code Status, Allergies Code Status Info: DNR Allergies Info: Amlodipine Besylate, Oxycodone           Current Medications (05/03/2019):  This is the current hospital active medication list Current Facility-Administered Medications  Medication Dose Route Frequency Provider Last Rate Last Dose  . 0.9 %  sodium chloride infusion   Intravenous Continuous Demetrios Loll, MD 100 mL/hr at 05/03/19 0509    . acetaminophen (TYLENOL) tablet 650 mg  650 mg Oral Q6H PRN Demetrios Loll, MD       Or  . acetaminophen (TYLENOL) suppository 650 mg  650 mg Rectal Q6H PRN Demetrios Loll, MD      . albuterol (PROVENTIL) (2.5 MG/3ML) 0.083% nebulizer solution 2.5 mg  2.5 mg Nebulization Q2H PRN Demetrios Loll, MD      .  bisacodyl (DULCOLAX) EC tablet 5 mg  5 mg Oral Daily PRN Demetrios Loll, MD      . cholecalciferol (VITAMIN D3) tablet 1,000 Units  1,000 Units Oral Daily Demetrios Loll, MD   1,000 Units at 05/03/19 1149  . feeding supplement (ENSURE ENLIVE) (ENSURE ENLIVE) liquid 237 mL  237 mL Oral BID BM Demetrios Loll, MD   237 mL at 05/02/19 1500  . heparin injection 5,000 Units  5,000 Units Subcutaneous Q8H Demetrios Loll, MD   5,000 Units at 05/03/19 1145  . ondansetron (ZOFRAN) tablet 4 mg  4 mg Oral Q6H PRN Demetrios Loll, MD       Or  . ondansetron  [facility]) injection 4 mg  4 mg Intravenous Q6H PRN Demetrios Loll, MD   4 mg at 05/03/19 8295  . senna-docusate (Senokot-S) tablet 1 tablet  1 tablet Oral QHS PRN Demetrios Loll, MD      . senna-docusate (Senokot-S) tablet 1 tablet  1 tablet Oral Daily Demetrios Loll, MD   1 tablet at 05/02/19 1132  . vitamin B-12 (CYANOCOBALAMIN) tablet 1,000 mcg  1,000 mcg Oral Daily Demetrios Loll, MD   1,000 mcg at 05/02/19 1132   Facility-Administered Medications Ordered in Other Encounters  Medication Dose Route Frequency Provider Last Rate Last Dose  . heparin lock flush 100 unit/mL  500 Units Intravenous Once Charlaine Dalton R, MD      . sodium chloride flush (NS) 0.9 % injection 10 mL  10 mL Intravenous PRN Cammie Sickle, MD         Discharge Medications: Please see discharge summary for a list of discharge medications.  Relevant Imaging Results:  Relevant Lab Results:   Additional Information SSN: [id number]  Beryl Balz, Veronia Beets, LCSW

## 2019-05-03 NOTE — Evaluation (Signed)
Physical Therapy Evaluation Patient Details Name: Emily [name] MRN: [id number] DOB: [birth year] Today's Date: 05/03/2019   History of Present Illness  From MD H&P: Pt is an 85 y.o. female with a known history that includes rectal/colon CA, HTN, arthritis, and HOH.  The patient is sent from [facility] due to abnormal lab with elevated creatinine from 1.0 to 3.59.  Assessment includes: Acute kidney injury, hyponatremia, HTN, and dementia.    Clinical Impression  Pt presents with deficits in strength, transfers, mobility, gait, balance, and activity tolerance.  Pt required max A with bed mobility tasks and had difficulty with static sitting at the EOB requiring occasional min A to prevent lateral LOB.  Multiple attempts made with encouragement to stand from an elevated EOB with pt unable to stand secondary to fatigue/weakness.  Pt will benefit from PT services in a SNF setting upon discharge to safely address above deficits for decreased caregiver assistance and eventual return to PLOF.      Follow Up Recommendations SNF (Pt admitted from [facility])    Equipment Recommendations  Other (comment)(TBD at next venue of care)    Recommendations for Other Services       Precautions / Restrictions Precautions Precautions: Fall Restrictions Weight Bearing Restrictions: No      Mobility  Bed Mobility Overal bed mobility: Needs Assistance Bed Mobility: Supine to Sit;Sit to Supine     Supine to sit: Max assist Sit to supine: Max assist   General bed mobility comments: Mod verbal and tactile cues for sequencing.  Transfers                 General transfer comment: Pt unable to stand from elevated EOB and after multiple attempts pt requested to return to supine secondary to fatigue.  Ambulation/Gait             General Gait Details: Unable  Stairs            Wheelchair Mobility    Modified Rankin (Stroke Patients Only)       Balance  Overall balance assessment: Needs assistance Sitting-balance support: Bilateral upper extremity supported Sitting balance-Leahy Scale: Fair         Standing balance comment: Unable to stand                             Pertinent Vitals/Pain Pain Assessment: No/denies pain    Home Living Family/patient expects to be discharged to:: Unsure(History from son via phone call due to pt being a poor historian.)                 Additional Comments: Pt was recently living at [facility] prior to recent prior admission and subsequent discharge to [facility].  Family had planned for pt to d/c from SNF to [facility].    Prior Function Level of Independence: Needs assistance   Gait / Transfers Assistance Needed: Per family unsure how patient was progressing at [facility] prior to this admission.  Prior to recent prior admission pt was at [facility] ambulating with a RW with a fall history.           Hand Dominance        Extremity/Trunk Assessment   Upper Extremity Assessment Upper Extremity Assessment: Generalized weakness    Lower Extremity Assessment Lower Extremity Assessment: Generalized weakness       Communication   Communication: No difficulties  Cognition Arousal/Alertness: Lethargic Behavior During Therapy: Flat affect Overall Cognitive Status: No family/caregiver present to determine baseline cognitive functioning                                        General Comments      Exercises Total Joint Exercises Ankle Circles/Pumps: AROM;Both;5 reps;10 reps Quad Sets: Strengthening;Both;10 reps Heel Slides: AROM;Both;10 reps;AAROM Hip ABduction/ADduction: AAROM;Both;10 reps Straight Leg Raises: AAROM;Both;10 reps Long Arc Quad: AROM;Both;5 reps Knee Flexion: AROM;Both;5 reps Other Exercises Other Exercises: Static sitting at EOB with occasional min A for upright posture x 5 min   Assessment/Plan    PT  Assessment Patient needs continued PT services  PT Problem List Decreased strength;Decreased activity tolerance;Decreased balance;Decreased mobility       PT Treatment Interventions DME instruction;Gait training;Functional mobility training;Therapeutic activities;Therapeutic exercise;Balance training;Patient/family education    PT Goals (Current goals can be found in the Care Plan section)  Acute Rehab PT Goals Patient Stated Goal: To get stronger PT Goal Formulation: With patient Time For Goal Achievement: 05/16/19 Potential to Achieve Goals: Fair    Frequency Min 2X/week   Barriers to discharge Inaccessible home environment      Co-evaluation               AM-PAC PT "6 Clicks" Mobility  Outcome Measure Help needed turning from your back to your side while in a flat bed without using bedrails?: A Lot Help needed moving from lying on your back to sitting on the side of a flat bed without using bedrails?: A Lot Help needed moving to and from a bed to a chair (including a wheelchair)?: Total Help needed standing up from a chair using your arms (e.g., wheelchair or bedside chair)?: Total Help needed to walk in hospital room?: Total Help needed climbing 3-5 steps with a railing? : Total 6 Click Score: 8    End of Session Equipment Utilized During Treatment: Gait belt Activity Tolerance: Patient limited by fatigue Patient left: in bed;with bed alarm set;with call bell/phone within reach Nurse Communication: Mobility status PT Visit Diagnosis: Muscle weakness (generalized) (M62.81);Difficulty in walking, not elsewhere classified (R26.2)    Time: 1105-1140 PT Time Calculation (min) (ACUTE ONLY): 35 min   Charges:   PT Evaluation $PT Eval Low Complexity: 1 Low PT Treatments $Therapeutic Exercise: 8-22 mins        D. Scott Marijose Curington PT, DPT 05/03/19, 12:00 PM

## 2019-05-03 NOTE — Progress Notes (Signed)
[facility]  ROUNDING NOTE   Subjective:  Renal function has improved. Creatinine down to 2.67.    Objective:  Vital signs in last 24 hours:  Temp:  [97.5 F (36.4 C)-98.3 F (36.8 C)] 98.3 F (36.8 C) (07/01 0915) Pulse Rate:  [83-89] 89 (07/01 0915) Resp:  [17-18] 18 (07/01 0915) BP: (112-119)/(48-64) 119/52 (07/01 0915) SpO2:  [94 %-98 %] 95 % (07/01 0915)  Weight change:  Filed Weights   04/26/2019 1747 04/20/2019 2300  Weight: 52.8 kg 53.4 kg    Intake/Output: I/O last 3 completed shifts: In: 3666.1 [P.O.:3; I.V.:2663.1; IV Piggyback:1000] Out: 25 [Urine:25]   Intake/Output this shift:  Total I/O In: 973.3 [I.V.:973.3] Out: -   Physical Exam: General: No acute distress  Head: Normocephalic, atraumatic. Moist oral mucosal membranes  Eyes: Anicteric  Neck: Supple, trachea midline  Lungs:  Clear to auscultation, normal effort  Heart: S1S2 no rubs  Abdomen:  Soft, nontender, bowel sounds present  Extremities: No peripheral edema.  Neurologic: Awake, alert, following commands  Skin: No lesions       Basic Metabolic Panel: Recent Labs  Lab 04/19/2019 1822 05/02/19 0431 05/03/19 0556  NA 134* 136 140  K 4.7 4.1 3.7  CL 97* 103 108  CO2 25 21* 18*  GLUCOSE 94 79 93  BUN 74* 76* 70*  CREATININE 3.59* 3.48* 2.67*  CALCIUM 8.7* 8.0* 8.3*  MG  --   --  1.8  PHOS  --   --  3.0    Liver Function Tests: Recent Labs  Lab 04/16/2019 1822  AST 28  ALT 16  ALKPHOS 607*  BILITOT 0.6  PROT 5.9*  ALBUMIN 2.7*   No results for input(s): LIPASE, AMYLASE in the last 168 hours. No results for input(s): AMMONIA in the last 168 hours.  CBC: Recent Labs  Lab 04/24/2019 1822 05/02/19 0431 05/03/19 0556  WBC 10.4 8.2 6.9  HGB 14.1 11.9* 12.0  HCT 42.5 36.8 37.8  MCV 88.9 89.3 91.7  PLT 152 134* 151    Cardiac Enzymes: No results for input(s): CKTOTAL, CKMB, CKMBINDEX, TROPONINI in the last 168 hours.  BNP: Invalid input(s):  POCBNP  CBG: No results for input(s): GLUCAP in the last 168 hours.  Microbiology: Results for orders placed or performed during the hospital encounter of 04/21/2019  SARS Coronavirus 2 (CEPHEID - Performed in [place] hospital lab), Hosp Order     Status: None   Collection Time: 04/16/2019  8:37 PM   Specimen: Nasopharyngeal Swab  Result Value Ref Range Status   SARS Coronavirus 2 NEGATIVE NEGATIVE Final    Comment: (NOTE) If result is NEGATIVE SARS-CoV-2 target nucleic acids are NOT DETECTED. The SARS-CoV-2 RNA is generally detectable in upper and lower  respiratory specimens during the acute phase of infection. The lowest  concentration of SARS-CoV-2 viral copies this assay can detect is 250  copies / mL. A negative result does not preclude SARS-CoV-2 infection  and should not be used as the sole basis for treatment or other  patient management decisions.  A negative result may occur with  improper specimen collection / handling, submission of specimen other  than nasopharyngeal swab, presence of viral mutation(s) within the  areas targeted by this assay, and inadequate number of viral copies  (<250 copies / mL). A negative result must be combined with clinical  observations, patient history, and epidemiological information. If result is POSITIVE SARS-CoV-2 target nucleic acids are DETECTED. The SARS-CoV-2 RNA is generally detectable in  upper and lower  respiratory specimens dur ing the acute phase of infection.  Positive  results are indicative of active infection with SARS-CoV-2.  Clinical  correlation with patient history and other diagnostic information is  necessary to determine patient infection status.  Positive results do  not rule out bacterial infection or co-infection with other viruses. If result is PRESUMPTIVE POSTIVE SARS-CoV-2 nucleic acids MAY BE PRESENT.   A presumptive positive result was obtained on the submitted specimen  and confirmed on repeat testing.   While 2019 novel coronavirus  (SARS-CoV-2) nucleic acids may be present in the submitted sample  additional confirmatory testing may be necessary for epidemiological  and / or clinical management purposes  to differentiate between  SARS-CoV-2 and other Sarbecovirus currently known to infect humans.  If clinically indicated additional testing with an alternate test  methodology [phone]) is advised. The SARS-CoV-2 RNA is generally  detectable in upper and lower respiratory sp ecimens during the acute  phase of infection. The expected result is Negative. Fact Sheet for Patients:  StrictlyIdeas.no Fact Sheet for Healthcare Providers: BankingDealers.co.za This test is not yet approved or cleared by the [place] FDA and has been authorized for detection and/or diagnosis of SARS-CoV-2 by FDA under an Emergency Use Authorization (EUA).  This EUA will remain in effect (meaning this test can be used) for the duration of the COVID-19 declaration under Section 564(b)(1) of the Act, 21 U.S.C. section 360bbb-3(b)(1), unless the authorization is terminated or revoked sooner. Performed at [facility], [address] [place], [place] [address]   MRSA PCR Screening     Status: None   Collection Time: 05/02/19  5:10 PM   Specimen: Nasal Mucosa; Nasopharyngeal  Result Value Ref Range Status   MRSA by PCR NEGATIVE NEGATIVE Final    Comment:        The GeneXpert MRSA Assay (FDA approved for NASAL specimens only), is one component of a comprehensive MRSA colonization surveillance program. It is not intended to diagnose MRSA infection nor to guide or monitor treatment for MRSA infections. Performed at [facility], [address] [place],  [address]     Coagulation Studies: No results for input(s): LABPROT, INR in the last 72 hours.  Urinalysis: No results for input(s): COLORURINE, LABSPEC, PHURINE, GLUCOSEU,  HGBUR, BILIRUBINUR, KETONESUR, PROTEINUR, UROBILINOGEN, NITRITE, LEUKOCYTESUR in the last 72 hours.  Invalid input(s): APPERANCEUR    Imaging: Dg Chest 1 View  Result Date: 05/03/2019 CLINICAL DATA:  Cough EXAM: CHEST  1 VIEW COMPARISON:  07/18/2016 FINDINGS: Previous right power port has been removed. Heart size is normal. Chronic aortic atherosclerosis. Chronic interstitial lung markings. Question mild patchy infiltrate in the left base. No dense consolidation or lobar collapse. No effusion. No acute bone finding. IMPRESSION: Chronic interstitial lung markings. Question slight increase in patchy density at the left base which could be atelectasis or mild pneumonia. Electronically Signed   By: Nelson Chimes M.D.   On: 05/03/2019 09:25   US Renal  Result Date: 05/02/2019 CLINICAL DATA:  Acute renal failure, history colon cancer, hypertension, hypopharyngeal cancer EXAM: RENAL / URINARY TRACT ULTRASOUND COMPLETE COMPARISON:  PET-CT 05/14/2016 FINDINGS: Right Kidney: Renal measurements: 9.8 x 4.5 x 5.3 cm = volume: 121 mL. Normal cortical thickness. Increased cortical echogenicity. Minimal fullness of renal pelvis without caliectasis. Tiny exophytic nodule 7 x 9 x 9 mm, hypoechoic, questionably tiny cyst, not identified by prior PET-CT. No additional masses. No shadowing calcifications. Left Kidney: Renal measurements: 9.3 x 4.4  x 4.9 cm = volume: 105 mL. Normal cortical thickness. Increased cortical echogenicity. Minimal collecting system dilatation. Tiny cyst 19 x 14 x 14 mm inferiorly, has a single septation. This is not definitely localized on the prior PET-CT. No additional masses or shadowing calcifications. Bladder: Partially distended, unremarkable. IMPRESSION: Medical renal disease changes of both kidneys. Minimal collecting system dilatation bilaterally. Tiny BILATERAL nodules question cysts, with the cyst at the inferior pole LEFT kidney 19 mm in largest size and containing a septation.  Electronically Signed   By: Lavonia Dana M.D.   On: 05/02/2019 10:41     Medications:   . sodium chloride 100 mL/hr at 05/03/19 1400   . cholecalciferol  1,000 Units Oral Daily  . feeding supplement (ENSURE ENLIVE)  237 mL Oral BID BM  . heparin  5,000 Units Subcutaneous Q8H  . senna-docusate  1 tablet Oral Daily  . cyanocobalamin  1,000 mcg Oral Daily   acetaminophen **OR** acetaminophen, albuterol, bisacodyl, ondansetron **OR** ondansetron (ZOFRAN) IV, senna-docusate  Assessment/ Plan:  85 y.o. female with a PMHx of colorectal cancer, diverticulosis, hemorrhoids, hypertension, osteopenia, osteoarthritis, who was admitted to [facility] on 04/22/2019 for evaluation of acute renal failure.   1.  Acute renal failure/chronic kidney disease stage III baseline creatinine 1.0 with an EGFR 52/Proteinuria.  Suspect that patient developed volume depletion.  She was on spironolactone prior to admission.  = Renal function has improved.  Creatinine down to 2.67.  No acute indication for dialysis.  Continue IV fluid hydration.  Awaiting further serologic work-up.  2.  Hypoalbuminemia.  Continue protein supplementation.   LOS: 2 Hyatt Capobianco 7/1/20202:56 PM

## 2019-05-03 NOTE — Progress Notes (Signed)
Patient's bottom is raw and bleeding when cleaning up bowel movement.  Patient needs I&O due to bladder scan of 400 per Dr. Stark Jock.  However, due to continue of patient's skin, paged Dr. Verdell Carmine, received verbal order for foley.  Clarise Cruz, BSN

## 2019-05-03 DEATH — deceased

## 2019-05-04 ENCOUNTER — Inpatient Hospital Stay: Payer: Medicare Other

## 2019-05-04 ENCOUNTER — Encounter: Payer: Self-pay | Admitting: Anesthesiology

## 2019-05-04 ENCOUNTER — Encounter: Admission: EM | Disposition: E | Payer: Self-pay | Source: Home / Self Care | Attending: Internal Medicine

## 2019-05-04 ENCOUNTER — Inpatient Hospital Stay: Payer: Medicare Other | Admitting: Anesthesiology

## 2019-05-04 ENCOUNTER — Other Ambulatory Visit: Payer: Medicare Other

## 2019-05-04 DIAGNOSIS — N179 Acute kidney failure, unspecified: Principal | ICD-10-CM

## 2019-05-04 DIAGNOSIS — K210 Gastro-esophageal reflux disease with esophagitis: Secondary | ICD-10-CM

## 2019-05-04 DIAGNOSIS — Z515 Encounter for palliative care: Secondary | ICD-10-CM

## 2019-05-04 DIAGNOSIS — I864 Gastric varices: Secondary | ICD-10-CM

## 2019-05-04 DIAGNOSIS — R1310 Dysphagia, unspecified: Secondary | ICD-10-CM

## 2019-05-04 DIAGNOSIS — R339 Retention of urine, unspecified: Secondary | ICD-10-CM

## 2019-05-04 DIAGNOSIS — Z7189 Other specified counseling: Secondary | ICD-10-CM

## 2019-05-04 HISTORY — PX: ESOPHAGOGASTRODUODENOSCOPY: SHX5428

## 2019-05-04 LAB — BASIC METABOLIC PANEL
Anion gap: 12 (ref 5–15)
BUN: 62 mg/dL — ABNORMAL HIGH (ref 8–23)
CO2: 17 mmol/L — ABNORMAL LOW (ref 22–32)
Calcium: 8.2 mg/dL — ABNORMAL LOW (ref 8.9–10.3)
Chloride: 113 mmol/L — ABNORMAL HIGH (ref 98–111)
Creatinine, Ser: 2.08 mg/dL — ABNORMAL HIGH (ref 0.44–1.00)
GFR calc Af Amer: 25 mL/min — ABNORMAL LOW (ref >60)
GFR calc non Af Amer: 21 mL/min — ABNORMAL LOW (ref >60)
Glucose, Bld: 86 mg/dL (ref 70–99)
Potassium: 3.8 mmol/L (ref 3.5–5.1)
Sodium: 142 mmol/L (ref 135–145)

## 2019-05-04 LAB — PROTEIN ELECTROPHORESIS, SERUM
A/G Ratio: 1 (ref 0.7–1.7)
Albumin ELP: 2.6 g/dL — ABNORMAL LOW (ref 2.9–4.4)
Alpha-1-Globulin: 0.3 g/dL (ref 0.0–0.4)
Alpha-2-Globulin: 0.9 g/dL (ref 0.4–1.0)
Beta Globulin: 0.9 g/dL (ref 0.7–1.3)
Gamma Globulin: 0.6 g/dL (ref 0.4–1.8)
Globulin, Total: 2.7 g/dL (ref 2.2–3.9)
Total Protein ELP: 5.3 g/dL — ABNORMAL LOW (ref 6.0–8.5)

## 2019-05-04 LAB — PROTEIN ELECTRO, RANDOM URINE
Albumin ELP, Urine: 22.5 %
Alpha-1-Globulin, U: 5.9 %
Alpha-2-Globulin, U: 18 %
Beta Globulin, U: 32.7 %
Gamma Globulin, U: 20.9 %
Total Protein, Urine: 134.9 mg/dL

## 2019-05-04 LAB — C3 COMPLEMENT: C3 Complement: 114 mg/dL (ref 82–167)

## 2019-05-04 LAB — ANA W/REFLEX IF POSITIVE: Anti Nuclear Antibody (ANA): NEGATIVE

## 2019-05-04 LAB — MPO/PR-3 (ANCA) ANTIBODIES
ANCA Proteinase 3: <3.5 U/mL (ref 0.0–3.5)
Myeloperoxidase Abs: <9 U/mL (ref 0.0–9.0)

## 2019-05-04 LAB — GLOMERULAR BASEMENT MEMBRANE ANTIBODIES: GBM Ab: 4 units (ref 0–20)

## 2019-05-04 LAB — PROTIME-INR
INR: 1.9 — ABNORMAL HIGH (ref 0.8–1.2)
Prothrombin Time: 21.4 seconds — ABNORMAL HIGH (ref 11.4–15.2)

## 2019-05-04 LAB — HEMOGLOBIN AND HEMATOCRIT, BLOOD
HCT: 32.7 % — ABNORMAL LOW (ref 36.0–46.0)
Hemoglobin: 10.7 g/dL — ABNORMAL LOW (ref 12.0–15.0)

## 2019-05-04 LAB — C4 COMPLEMENT: Complement C4, Body Fluid: 27 mg/dL (ref 14–44)

## 2019-05-04 LAB — MAGNESIUM: Magnesium: 1.7 mg/dL (ref 1.7–2.4)

## 2019-05-04 SURGERY — EGD (ESOPHAGOGASTRODUODENOSCOPY)
Anesthesia: General | Laterality: Left

## 2019-05-04 MED ORDER — LIDOCAINE HCL (CARDIAC) PF 100 MG/5ML IV SOSY
PREFILLED_SYRINGE | INTRAVENOUS | Status: DC | PRN
  Administered 2019-05-04: 30 mg via INTRAVENOUS

## 2019-05-04 MED ORDER — FENTANYL CITRATE (PF) 100 MCG/2ML IJ SOLN
INTRAMUSCULAR | Status: AC
  Filled 2019-05-04: qty 2

## 2019-05-04 MED ORDER — SODIUM CHLORIDE 0.9 % IV SOLN: INTRAVENOUS | Status: DC

## 2019-05-04 MED ORDER — PANTOPRAZOLE SODIUM 40 MG IV SOLR
40.0000 mg | INTRAVENOUS | Status: DC
  Administered 2019-05-04: 09:00:00 40 mg via INTRAVENOUS
  Filled 2019-05-04: qty 40

## 2019-05-04 MED ORDER — DEXTROSE-NACL 5-0.9 % IV SOLN
INTRAVENOUS | Status: DC
  Administered 2019-05-04 – 2019-05-08 (×5): via INTRAVENOUS

## 2019-05-04 MED ORDER — PROPOFOL 500 MG/50ML IV EMUL
INTRAVENOUS | Status: DC | PRN
  Administered 2019-05-04: 75 ug/kg/min via INTRAVENOUS

## 2019-05-04 MED ORDER — PHENYLEPHRINE HCL (PRESSORS) 10 MG/ML IV SOLN
INTRAVENOUS | Status: DC | PRN
  Administered 2019-05-04 (×2): 50 ug via INTRAVENOUS

## 2019-05-04 MED ORDER — SODIUM CHLORIDE 0.9 % IV SOLN
INTRAVENOUS | Status: DC
  Administered 2019-05-04: 1000 mL via INTRAVENOUS

## 2019-05-04 MED ORDER — LIDOCAINE HCL (PF) 2 % IJ SOLN
INTRAMUSCULAR | Status: AC
  Filled 2019-05-04: qty 10

## 2019-05-04 MED ORDER — FENTANYL CITRATE (PF) 100 MCG/2ML IJ SOLN
INTRAMUSCULAR | Status: DC | PRN
  Administered 2019-05-04: 50 ug via INTRAVENOUS

## 2019-05-04 MED ORDER — PANTOPRAZOLE SODIUM 40 MG IV SOLR
40.0000 mg | Freq: Two times a day (BID) | INTRAVENOUS | Status: DC
  Administered 2019-05-04 – 2019-05-07 (×6): 40 mg via INTRAVENOUS
  Filled 2019-05-04 (×6): qty 40

## 2019-05-04 MED ORDER — PROPOFOL 500 MG/50ML IV EMUL
INTRAVENOUS | Status: AC
  Filled 2019-05-04: qty 50

## 2019-05-04 MED ORDER — PHENYLEPHRINE HCL (PRESSORS) 10 MG/ML IV SOLN
INTRAVENOUS | Status: AC
  Filled 2019-05-04: qty 1

## 2019-05-04 NOTE — Progress Notes (Addendum)
[facility] NAME: Vayda Dungee    MR#:  [id number]  DATE OF BIRTH:  1933-06-11  SUBJECTIVE:  CHIEF COMPLAINT:   Chief Complaint  Patient presents with  . Abnormal Lab   Patient reported some abdominal discomfort.  Stat KUB done this morning with no acute findings.  Patient to be seen by GI today for evaluation for dysphagia with possible EGD.  No fevers.  REVIEW OF SYSTEMS:  ROS unobtainable due to patient's mental status  DRUG ALLERGIES:   Allergies  Allergen Reactions  . Amlodipine Besylate Other (See Comments)    Patient unaware of any allergy with this medicine  . Oxycodone     confusion   VITALS:  Blood pressure (!) 112/52, pulse 98, temperature 97.8 F (36.6 C), temperature source Oral, resp. rate 16, height 5\' 4"  (1.626 m), weight 53.4 kg, SpO2 95 %. PHYSICAL EXAMINATION:  Physical Exam   GENERAL:  85 y.o.-year-old patient lying in the bed with no acute distress.  EYES: Pupils equal, round, reactive to light and accommodation. No scleral icterus. Extraocular muscles intact.  HEENT: Head atraumatic, normocephalic. Oropharynx and nasopharynx clear.  NECK:  Supple, no jugular venous distention. No thyroid enlargement, no tenderness.  LUNGS: Normal breath sounds bilaterally, no wheezing, rales,rhonchi or crepitation. No use of accessory muscles of respiration.  CARDIOVASCULAR: S1, S2 normal. No murmurs, rubs, or gallops.  ABDOMEN: Soft, nontender, nondistended. Bowel sounds present. No organomegaly or mass.  EXTREMITIES: No pedal edema, cyanosis, or clubbing.  NEUROLOGIC: Unable to exam.  Patient pleasantly confused. PSYCHIATRIC: The patient is demented.  LABORATORY PANEL:  Female CBC Recent Labs  Lab 05/03/19 0556  WBC 6.9  HGB 12.0  HCT 37.8  PLT 151   ------------------------------------------------------------------------------------------------------------------ Chemistries  Recent Labs  Lab  04/29/2019 1822  05/11/2019 0418  NA 134*   < > 142  K 4.7   < > 3.8  CL 97*   < > 113*  CO2 25   < > 17*  GLUCOSE 94   < > 86  BUN 74*   < > 62*  CREATININE 3.59*   < > 2.08*  CALCIUM 8.7*   < > 8.2*  MG  --    < > 1.7  AST 28  --   --   ALT 16  --   --   ALKPHOS 607*  --   --   BILITOT 0.6  --   --    < > = values in this interval not displayed.   RADIOLOGY:  Dg Abd 1 View  Result Date: 05/31/2019 CLINICAL DATA:  Abdominal distension EXAM: ABDOMEN - 1 VIEW COMPARISON:  05/13/2013 abdominal CT FINDINGS: Relatively gasless abdomen with no evidence of obstruction. No concerning mass effect or gas collection. Postoperative changes in the pelvis with bowel sutures. Atherosclerotic calcification. Clear lung bases. IMPRESSION: Nonobstructive bowel gas pattern.  No abnormal stool retention. Electronically Signed   By: Monte Fantasia M.D.   On: 05/25/2019 08:46   ASSESSMENT AND PLAN:   1. Acute kidney injury  Felt to be secondary to dehydration.   Being hydrated with IV fluids with gradual improvement in renal function.  Renal ultrasound with evidence of medical renal disease.  Minimal collecting system dilatation bilaterally.Lisinopril placed on hold.   Appreciate nephrology input  2. Hyponatremia.   Likely due to dehydration.  Resolved with IV fluid hydration  3. Hypertension.  Hold lisinopril and Lopressor due to low side blood  pressure.  Consider resuming Lopressor if blood pressure begins to trend up  4. Dementia.  Aspiration and fall precaution Stable.  Patient pleasantly confused. Patient noted to be currently being followed by [facility] at [facility].  5.  Dysphagia Nursing staff reported coughing while swallowing. *Chest x-ray showed chronic interstitial lung markings.  Increasing patchy density in the left base which could be atelectasis or mild pneumonia.  Clinically no fevers.  No leukocytosis.  No clinical evidence to support pneumonia. Seen by speech  therapist and recommended GI evaluation. Discussed with patient's son yesterday who confirmed patient has had prior history of neck cancer status post radiation treatment.  Son reported she had follow-up laryngoscopy couple of months ago which was unremarkable.  Son wishes to evaluate the dysphagia by consulting GI. GI to see patient today for evaluation with possible EGD CT neck without contrast ordered to rule out recurrence of neck cancer. Placed on PPI IV.  6.  Acute urinary retention Patient with lower abdominal pain Bladder scan done with 680ml.  Unsuccessful attempt at placing Foley catheter. Urology consult placed. Follow-up CT abdomen without contrast for further evaluation  DVT prophylaxis; heparin   All the records are reviewed and case discussed with Care Management/Social Worker. Called and discussed with patient's son; Mr. [name] over the phone was monitored and updated him on treatment plans.  All questions answered.  He agrees with plan of care as outlined above  CODE STATUS: DNR  TOTAL TIME TAKING CARE OF THIS PATIENT: 37 minutes.   More than 50% of the time was spent in counseling/coordination of care: YES  POSSIBLE D/C IN 2 DAYS, DEPENDING ON CLINICAL CONDITION.   Katryna Tschirhart M.D on 05/09/2019 at 11:07 AM  Between 7am to 6pm - Pager - [phone]  After 6pm go to [email or web address] - Proofreader  [facility]  Office  [phone]  CC: Primary care physician; [name], MD  Note: This dictation was prepared with Dragon dictation along with smaller phrase technology. Any transcriptional errors that result from this process are unintentional.

## 2019-05-04 NOTE — Anesthesia Preprocedure Evaluation (Signed)
Anesthesia Evaluation  Patient identified by MRN, date of birth, ID band Patient awake    Reviewed: Allergy & Precautions, NPO status , Patient's Chart, lab work & pertinent test results, reviewed documented beta blocker date and time   History of Anesthesia Complications (+) DIFFICULT AIRWAY  Airway Mallampati: II  TM Distance: >3 FB     Dental  (+) Chipped   Pulmonary shortness of breath, former smoker,           Cardiovascular hypertension, Pt. on medications and Pt. on home beta blockers      Neuro/Psych    GI/Hepatic GERD  ,  Endo/Other    Renal/GU ARFRenal disease     Musculoskeletal  (+) Arthritis ,   Abdominal   Peds  Hematology   Anesthesia Other Findings Smokes.  Reproductive/Obstetrics                             Anesthesia Physical Anesthesia Plan  ASA: III  Anesthesia Plan: General   Post-op Pain Management:    Induction: Intravenous  PONV Risk Score and Plan:   Airway Management Planned:   Additional Equipment:   Intra-op Plan:   Post-operative Plan:   Informed Consent: I have reviewed the patients History and Physical, chart, labs and discussed the procedure including the risks, benefits and alternatives for the proposed anesthesia with the patient or authorized representative who has indicated his/her understanding and acceptance.       Plan Discussed with: CRNA  Anesthesia Plan Comments:         Anesthesia Quick Evaluation

## 2019-05-04 NOTE — Op Note (Signed)
[facility] Gastroenterology Patient Name: Emily [name] Procedure Date: 05/14/2019 12:01 PM MRN: [id number] Account #: [id number] Date of Birth: 1932-12-10 Admit Type: Inpatient Age: 85 Room: [facility] ENDO ROOM 1 Gender: Female Note Status: Finalized Procedure:            Upper GI endoscopy Indications:          Dysphagia Providers:            Farhad Burleson B. Bonna Gains MD, MD Referring MD:         Kirstie Peri. Caryn Section, MD (Referring MD) Medicines:            Monitored Anesthesia Care Complications:        No immediate complications. Procedure:            Pre-Anesthesia Assessment:                       - The risks and benefits of the procedure and the                        sedation options and risks were discussed with the                        patient. All questions were answered and informed                        consent was obtained.                       - Patient identification and proposed procedure were                        verified prior to the procedure.                       - ASA Grade Assessment: III - A patient with severe                        systemic disease.                       After obtaining informed consent, the endoscope was                        passed under direct vision. Throughout the procedure,                        the patient's blood pressure, pulse, and oxygen                        saturations were monitored continuously. The Endoscope                        was introduced through the mouth, and advanced to the                        second part of duodenum. The upper GI endoscopy was                        accomplished with ease. The patient tolerated the  procedure well. Findings:      LA Grade B (one or more mucosal breaks greater than 5 mm, not extending       between the tops of two mucosal folds) esophagitis with no bleeding was       found in the distal esophagus.      There was mild resistance while  passing the scope past the vocal cords       into the upper esophagus, but there was no stricture or narrowing       present in this area.      Type 2 isolated gastric varices (IGV2, varices located in the body,       antrum or around the pylorus) with no bleeding were found in the gastric       body. There were no stigmata of recent bleeding.      An opening from previous PEG tube site was seen. However, it appeared to       have a blind end and did not appear to be communicating with the       external abdominal wall.      The exam of the stomach was otherwise normal.      Patchy mildly erythematous mucosa without active bleeding and with no       stigmata of bleeding was found in the duodenal bulb.      A single 6 mm mucosal nodule with a localized distribution was found in       the second portion of the duodenum. Biopsies were taken with a cold       forceps for histology. Impression:           - LA Grade B reflux esophagitis.                       - There was mild resistance while passing the scope                        past the vocal cords into the upper esophagus, but                        there was no stricture or narrowing present in this                        area.                       - Type 2 isolated gastric varices (IGV2, varices                        located in the body, antrum or around the pylorus),                        without bleeding.                       - An opening from previous PEG tube site was seen.                        However, it appeared to have a blind end and did not                        appear to be communicating with the external abdominal  wall.                       - Erythematous duodenopathy.                       - Mucosal nodule found in the duodenum. Biopsied.                       - Due to presence of Gastric varices biopsies for H                        Pylori were not done. Recommendation:       - Would  recommend ENT evaluation due to mild resistance                        noted while passing the scope into the upper esophagus                        to evaluate for presence of cricopharyngeal bar or rule                        out other abnormality in the area                       - Use Protonix (pantoprazole) 40 mg PO BID.                       - Follow an antireflux regimen.                       - Obtain CT abdomen/Pelvis as pt reports abdominal pain                        and has some abdominal distension. This would also                        allow to evaluate if a gastric fistula is present at                        the previous PEG site                       - Will order Liver Doppler to evaluate for portal                        hypertension                       - The findings and recommendations were discussed with                        the patient.                       - The findings and recommendations were discussed with                        the patient's family.                       - Perform an H. pylori serology today. Procedure  Code(s):    --- Professional ---                       [phone], Esophagogastroduodenoscopy, flexible, transoral;                        with biopsy, single or multiple Diagnosis Code(s):    --- Professional ---                       K21.0, Gastro-esophageal reflux disease with esophagitis                       I86.4, Gastric varices                       K31.89, Other diseases of stomach and duodenum                       R13.10, Dysphagia, unspecified CPT copyright 2019 American Medical Association. All rights reserved. The codes documented in this report are preliminary and upon coder review may  be revised to meet current compliance requirements.  Vonda Antigua, MD Margretta Sidle B. Bonna Gains MD, MD 05/15/2019 1:40:17 PM This report has been signed electronically. Number of Addenda: 0 Note Initiated On: 05/16/2019 12:01 PM Estimated Blood  Loss: Estimated blood loss: none.      [facility]

## 2019-05-04 NOTE — Consult Note (Addendum)
Consultation Note Date: 05/09/2019   Patient Name: Emily [name]  DOB: September 15, 1933  MRN: [id number]  Age / Sex: 85 y.o., female  PCP: [name], MD Referring Physician: Otila Back, MD  Reason for Consultation: Establishing goals of care  HPI/Patient Profile:  The patient is a 85 y/o female sent from nursing home to ED due to abnormal lab with elevated creatinine.  Clinical Assessment and Goals of Care: Patient is resting in bed. She is having abdominal pain. Staff are trying to place a foley. Son [name] is HPOA. Spoke with son and daughter in law via phone. Ms. Poteat lived alone prior to her previous admission where she was transferred to a SNF. He states they have not been able to see her, and she has declined. He states typically she watches t.v., she is conversive but has short term memory issues, and wears depends.   We discussed her diagnosis, prognosis, GOC, EOL wishes disposition and options.  A detailed discussion was had today regarding advanced directives.  Concepts specific to code status, artifical feeding and hydration, and rehospitalization were discussed.  The difference between an aggressive medical intervention path and a comfort care path was discussed.  Values and goals of care important to patient and family were attempted to be elicited. Discussed limitations of medical interventions to prolong quality of life in some situations and discussed the concept of human mortality.    He states he would like to know what is causing her issues. He states if it is something a medication can fix, he would be amenable. He discusses her past cancer, and feeding tube, and states she would not want one. He states his grandfather had dialysis, and she decided she would never want that. He states she would not want to live on machines. She would not want further surgery, as with every surgery, her  mental status declines, and would not want any oncologic intervention as he does not feel she is strong enough. DNR/DNI. He states he was told she has an ulcer, and is hopeful treating the ulcer will make her want to eat and drink more. If not, he would like to consider [facility]. He states he does not want her to be in "a vicious cycle of returning to the hospital" to improve and decline afterwards with readmission.  He states an acceptable QOL is being able to eat and drink, what what is going on, and communicate.      SUMMARY OF RECOMMENDATIONS   DNR/DNI No oncologic intervention or surgical intervention.   Hospice at D/C either at son's home or at [facility] depending on son's preference. They would like hospice facility if she has < 2 weeks.     Prognosis:   Unable to determine  Discharge Planning: To Be Determined      Primary Diagnoses: Present on Admission: . ARF (acute renal failure) ([place])   I have reviewed the medical record, interviewed the patient and family, and examined the patient. The following aspects are pertinent.  Past  Medical History:  Diagnosis Date  . Arthritis    fingers  . Cancer ([place])    rectal  . Cancer ([place]) 2012   colon cancer  . 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 Colon cancer ([place])   . Difficult intubation   . Diverticulosis   . Dyspnea    SOB  . Hemorrhoids   . History of chicken pox   . HOH (hard of hearing)   . Hypertension   . Osteopenia   . Status post chemotherapy    colon cancer  . Status post radiation therapy    colon cancer 2013   Social History   Socioeconomic History  . Marital status: Unknown    Spouse name: Not on file  . Number of children: 3  . Years of education: Not on file  . Highest education level: Not on file  Occupational History  . Occupation: Unemployed    Comment: Homemaker  Social Needs  . Financial resource strain: Not hard at all  . Food insecurity    Worry: Never  true    Inability: Never true  . Transportation needs    Medical: No    Non-medical: No  Tobacco Use  . Smoking status: Former Smoker    Packs/day: 1.00    Years: 64.00    Pack years: 64.00    Types: Cigarettes  . Smokeless tobacco: Never Used  Substance and Sexual Activity  . Alcohol use: Yes    Alcohol/week: 0.0 standard drinks    Comment: rarely  . Drug use: No  . Sexual activity: Not on file  Lifestyle  . Physical activity    Days per week: 0 days    Minutes per session: 0 min  . Stress: Not at all  Relationships  . Social Herbalist on phone: Twice a week    Gets together: Patient refused    Attends religious service: 1 to 4 times per year    Active member of club or organization: No    Attends meetings of clubs or organizations: Never    Relationship status: Widowed  Other Topics Concern  . Not on file  Social History Narrative   ** Merged History Encounter **       Family History  Problem Relation Age of Onset  . Kidney failure Father   . Breast cancer Maternal Grandmother    Scheduled Meds: . cholecalciferol  1,000 Units Oral Daily  . feeding supplement (ENSURE ENLIVE)  237 mL Oral BID BM  . heparin  5,000 Units Subcutaneous Q8H  . pantoprazole (PROTONIX) IV  40 mg Intravenous Q12H  . senna-docusate  1 tablet Oral Daily  . cyanocobalamin  1,000 mcg Oral Daily   Continuous Infusions: . dextrose 5 % and 0.9% NaCl 100 mL/hr at 05/07/2019 0924   PRN Meds:.acetaminophen **OR** acetaminophen, albuterol, bisacodyl, ondansetron **OR** ondansetron (ZOFRAN) IV, senna-docusate Medications Prior to Admission:  Prior to Admission medications   Medication Sig Start Date End Date Taking? Authorizing Provider  bisacodyl (DULCOLAX) 10 MG suppository Place 10 mg rectally as needed for moderate constipation.   Yes [provider]  bisacodyl (DULCOLAX) 5 MG EC tablet Take 5 mg by mouth daily as needed for moderate constipation.   Yes [provider]  Cholecalciferol (VITAMIN D-3) 1000 units CAPS Take 1,000 Units by mouth daily.    Yes [provider]  cyanocobalamin 1000 MCG tablet Take 1,000 mcg by mouth daily.   Yes [provider]  donepezil (ARICEPT) 5 MG tablet Take 1 tablet (5 mg total) by mouth at bedtime. 03/16/18  Yes [name], MD  lisinopril (PRINIVIL,ZESTRIL) 10 MG tablet One tablet daily Patient taking differently: Take 10 mg by mouth daily.  03/17/18  Yes [name], MD  metoCLOPramide (REGLAN) 5 MG tablet Take 5 mg by mouth 4 (four) times daily.   Yes [provider]  metoprolol succinate (TOPROL-XL) 25 MG 24 hr tablet Take 0.5 tablets (12.5 mg total) by mouth daily. 05/16/18  Yes [name], MD  ondansetron (ZOFRAN) 4 MG tablet Take 4 mg by mouth every 6 (six) hours as needed for nausea or vomiting.   Yes [provider]  sennosides-docusate sodium (SENOKOT-S) 8.6-50 MG tablet Take 1 tablet by mouth daily.   Yes [provider]  spironolactone (ALDACTONE) 25 MG tablet Take 25 mg by mouth daily. Mondays,Wednesday, Fridays   Yes [provider]   Allergies  Allergen Reactions  . Amlodipine Besylate Other (See Comments)    Patient unaware of any allergy with this medicine  . Oxycodone     confusion   Review of Systems  Gastrointestinal: Positive for abdominal pain.    Physical Exam Pulmonary:     Effort: Pulmonary effort is normal.  Skin:    General: Skin is warm and dry.  Neurological:     Mental Status: She is alert.     Vital Signs: BP 104/60 (BP Location: Left Arm)   Pulse 98   Temp 97.9 F (36.6 C) (Axillary)   Resp 16   Ht 5\' 4"  (1.626 m)   Wt 53.4 kg   SpO2 94%   BMI 20.21 kg/m  Pain Scale: 0-10   Pain Score: Asleep   SpO2: SpO2: 94 % O2 Device:SpO2: 94 % O2 Flow Rate: .O2 Flow Rate (L/min): 1 L/min  IO: Intake/output summary:   Intake/Output Summary (Last 24 hours) at 05/09/2019 1545 Last data filed at  05/27/2019 1430 Gross per 24 hour  Intake 1861.26 ml  Output 375 ml  Net 1486.26 ml    LBM: Last BM Date: 05/03/19 Baseline Weight: Weight: 52.8 kg Most recent weight: Weight: 53.4 kg     Palliative Assessment/Data:     Time In: 3:00 Time Out: 4:15 Time Total: 75 min Greater than 50%  of this time was spent counseling and coordinating care related to the above assessment and plan.  Signed by: Asencion Gowda, NP   Please contact Palliative Medicine Team phone at [phone] for questions and concerns.  For individual provider: See Shea Evans

## 2019-05-04 NOTE — Care Management Important Message (Signed)
Important Message  Patient Details  Name: Emily [name] MRN: [id number] Date of Birth: July 10, 1933   Medicare Important Message Given:  Other (see comment)  Second attempt to obtain signature today and patient out of the room for a test.  Will try again.    Juliann Pulse A Jyles Sontag 05/26/2019, 12:44 PM

## 2019-05-04 NOTE — Progress Notes (Signed)
[facility]  ROUNDING NOTE   Subjective:  Pt seen at bedside.  Cr down to 2.1.   Resting in bed.   Objective:  Vital signs in last 24 hours:  Temp:  [97.3 F (36.3 C)-97.8 F (36.6 C)] 97.5 F (36.4 C) (07/02 1319) Pulse Rate:  [86-104] 99 (07/02 1349) Resp:  [12-20] 13 (07/02 1349) BP: (88-139)/(45-57) 94/45 (07/02 1349) SpO2:  [94 %-99 %] 94 % (07/02 1349)  Weight change:  Filed Weights   04/05/2019 1747 04/17/2019 2300  Weight: 52.8 kg 53.4 kg    Intake/Output: I/O last 3 completed shifts: In: 2023.7 [I.V.:2023.7] Out: 150 [Urine:150]   Intake/Output this shift:  Total I/O In: 1700.6 [I.V.:1700.6] Out: 0   Physical Exam: General: No acute distress  Head: L eye ecchymosis. Moist oral mucosal membranes  Eyes: Anicteric  Neck: Supple, trachea midline  Lungs:  Clear to auscultation, normal effort  Heart: S1S2 no rubs  Abdomen:  Soft, nontender, bowel sounds present  Extremities: No peripheral edema.  Neurologic: Awake, alert, following commands  Skin: No lesions       Basic Metabolic Panel: Recent Labs  Lab 04/30/2019 1822 05/02/19 0431 05/03/19 0556 06/02/2019 0418  NA 134* 136 140 142  K 4.7 4.1 3.7 3.8  CL 97* 103 108 113*  CO2 25 21* 18* 17*  GLUCOSE 94 79 93 86  BUN 74* 76* 70* 62*  CREATININE 3.59* 3.48* 2.67* 2.08*  CALCIUM 8.7* 8.0* 8.3* 8.2*  MG  --   --  1.8 1.7  PHOS  --   --  3.0  --     Liver Function Tests: Recent Labs  Lab 04/17/2019 1822  AST 28  ALT 16  ALKPHOS 607*  BILITOT 0.6  PROT 5.9*  ALBUMIN 2.7*   No results for input(s): LIPASE, AMYLASE in the last 168 hours. No results for input(s): AMMONIA in the last 168 hours.  CBC: Recent Labs  Lab 05/02/2019 1822 05/02/19 0431 05/03/19 0556  WBC 10.4 8.2 6.9  HGB 14.1 11.9* 12.0  HCT 42.5 36.8 37.8  MCV 88.9 89.3 91.7  PLT 152 134* 151    Cardiac Enzymes: No results for input(s): CKTOTAL, CKMB, CKMBINDEX, TROPONINI in the last 168 hours.  BNP: Invalid  input(s): POCBNP  CBG: No results for input(s): GLUCAP in the last 168 hours.  Microbiology: Results for orders placed or performed during the hospital encounter of 04/08/2019  SARS Coronavirus 2 (CEPHEID - Performed in [place] hospital lab), Hosp Order     Status: None   Collection Time: 04/30/2019  8:37 PM   Specimen: Nasopharyngeal Swab  Result Value Ref Range Status   SARS Coronavirus 2 NEGATIVE NEGATIVE Final    Comment: (NOTE) If result is NEGATIVE SARS-CoV-2 target nucleic acids are NOT DETECTED. The SARS-CoV-2 RNA is generally detectable in upper and lower  respiratory specimens during the acute phase of infection. The lowest  concentration of SARS-CoV-2 viral copies this assay can detect is 250  copies / mL. A negative result does not preclude SARS-CoV-2 infection  and should not be used as the sole basis for treatment or other  patient management decisions.  A negative result may occur with  improper specimen collection / handling, submission of specimen other  than nasopharyngeal swab, presence of viral mutation(s) within the  areas targeted by this assay, and inadequate number of viral copies  (<250 copies / mL). A negative result must be combined with clinical  observations, patient history, and epidemiological information.  If result is POSITIVE SARS-CoV-2 target nucleic acids are DETECTED. The SARS-CoV-2 RNA is generally detectable in upper and lower  respiratory specimens dur ing the acute phase of infection.  Positive  results are indicative of active infection with SARS-CoV-2.  Clinical  correlation with patient history and other diagnostic information is  necessary to determine patient infection status.  Positive results do  not rule out bacterial infection or co-infection with other viruses. If result is PRESUMPTIVE POSTIVE SARS-CoV-2 nucleic acids MAY BE PRESENT.   A presumptive positive result was obtained on the submitted specimen  and confirmed on repeat  testing.  While 2019 novel coronavirus  (SARS-CoV-2) nucleic acids may be present in the submitted sample  additional confirmatory testing may be necessary for epidemiological  and / or clinical management purposes  to differentiate between  SARS-CoV-2 and other Sarbecovirus currently known to infect humans.  If clinically indicated additional testing with an alternate test  methodology [phone]) is advised. The SARS-CoV-2 RNA is generally  detectable in upper and lower respiratory sp ecimens during the acute  phase of infection. The expected result is Negative. Fact Sheet for Patients:  StrictlyIdeas.no Fact Sheet for Healthcare Providers: BankingDealers.co.za This test is not yet approved or cleared by the [place] FDA and has been authorized for detection and/or diagnosis of SARS-CoV-2 by FDA under an Emergency Use Authorization (EUA).  This EUA will remain in effect (meaning this test can be used) for the duration of the COVID-19 declaration under Section 564(b)(1) of the Act, 21 U.S.C. section 360bbb-3(b)(1), unless the authorization is terminated or revoked sooner. Performed at [facility], [address] [place], [place] [address]   MRSA PCR Screening     Status: None   Collection Time: 05/02/19  5:10 PM   Specimen: Nasal Mucosa; Nasopharyngeal  Result Value Ref Range Status   MRSA by PCR NEGATIVE NEGATIVE Final    Comment:        The GeneXpert MRSA Assay (FDA approved for NASAL specimens only), is one component of a comprehensive MRSA colonization surveillance program. It is not intended to diagnose MRSA infection nor to guide or monitor treatment for MRSA infections. Performed at [facility], [address] [place], [place] [address]     Coagulation Studies: No results for input(s): LABPROT, INR in the last 72 hours.  Urinalysis: No results for input(s): COLORURINE, LABSPEC, PHURINE,  GLUCOSEU, HGBUR, BILIRUBINUR, KETONESUR, PROTEINUR, UROBILINOGEN, NITRITE, LEUKOCYTESUR in the last 72 hours.  Invalid input(s): APPERANCEUR    Imaging: Dg Chest 1 View  Result Date: 05/03/2019 CLINICAL DATA:  Cough EXAM: CHEST  1 VIEW COMPARISON:  07/18/2016 FINDINGS: Previous right power port has been removed. Heart size is normal. Chronic aortic atherosclerosis. Chronic interstitial lung markings. Question mild patchy infiltrate in the left base. No dense consolidation or lobar collapse. No effusion. No acute bone finding. IMPRESSION: Chronic interstitial lung markings. Question slight increase in patchy density at the left base which could be atelectasis or mild pneumonia. Electronically Signed   By: Nelson Chimes M.D.   On: 05/03/2019 09:25   Dg Abd 1 View  Result Date: 05/25/2019 CLINICAL DATA:  Abdominal distension EXAM: ABDOMEN - 1 VIEW COMPARISON:  05/13/2013 abdominal CT FINDINGS: Relatively gasless abdomen with no evidence of obstruction. No concerning mass effect or gas collection. Postoperative changes in the pelvis with bowel sutures. Atherosclerotic calcification. Clear lung bases. IMPRESSION: Nonobstructive bowel gas pattern.  No abnormal stool retention. Electronically Signed   By: Neva Seat.D.  On: 05/31/2019 08:46   Ct Soft Tissue Neck Wo Contrast  Result Date: 05/07/2019 CLINICAL DATA:  Neck mass with history of malignancy EXAM: CT NECK WITHOUT CONTRAST TECHNIQUE: Multidetector CT imaging of the neck was performed following the standard protocol without intravenous contrast. COMPARISON:  None. FINDINGS: Pharynx and larynx: No evidence of mass or swelling Salivary glands: No inflammation, mass, or stone. Thyroid: Small gland that is otherwise unremarkable Lymph nodes: History of colorectal cancer. No evidence of adenopathy in the neck with close attention to the left supraclavicular fossa. I do not see any palpable markers to correlate with history of neck mass. Vascular:  Atherosclerotic calcification. Limited intracranial: Negative Visualized orbits: Bilateral cataract resection Mastoids and visualized paranasal sinuses: Clear Skeleton: Partially covered sclerotic lesion in the T5 vertebra. Elsewhere bones are osteopenic. Upper chest: Small (where visualized) layering pleural effusions. There is airway thickening and mild ground-glass opacity in the right upper lobe. IMPRESSION: 1. No evidence of malignancy in the neck. 2. Sclerotic focus in the T5 vertebra, partially seen but suspicious for metastatic focus. Most recent staging scans are from 2017, is there outside staging available? 3. Small pleural effusions and mild nonspecific airspace opacity in the right upper lobe. Electronically Signed   By: Monte Fantasia M.D.   On: 05/27/2019 11:09     Medications:   . sodium chloride 1,000 mL (05/20/2019 1154)  . dextrose 5 % and 0.9% NaCl 100 mL/hr at 05/30/2019 0924   . [MAR Hold] cholecalciferol  1,000 Units Oral Daily  . [MAR Hold] feeding supplement (ENSURE ENLIVE)  237 mL Oral BID BM  . [MAR Hold] heparin  5,000 Units Subcutaneous Q8H  . pantoprazole (PROTONIX) IV  40 mg Intravenous Q12H  . [MAR Hold] senna-docusate  1 tablet Oral Daily  . [MAR Hold] cyanocobalamin  1,000 mcg Oral Daily   [MAR Hold] acetaminophen **OR** [MAR Hold] acetaminophen, [MAR Hold] albuterol, [MAR Hold] bisacodyl, [MAR Hold] ondansetron **OR** [MAR Hold] ondansetron (ZOFRAN) IV, [MAR Hold] senna-docusate  Assessment/ Plan:  85 y.o. female with a PMHx of colorectal cancer, diverticulosis, hemorrhoids, hypertension, osteopenia, osteoarthritis, who was admitted to [facility] on 04/28/2019 for evaluation of acute renal failure.   1.  Acute renal failure/chronic kidney disease stage III baseline creatinine 1.0 with an EGFR 52/Proteinuria.  Suspect that patient developed volume depletion.  She was on spironolactone prior to admission.  - renal function continues to improve.  Creatinine down to 2.1.   Continue IV fluid hydration with D5 normal saline for now.  2.  Hypoalbuminemia.  Continue protein supplements unable to eat again.   LOS: 3 Keymon Mcelroy 7/2/20202:03 PM

## 2019-05-04 NOTE — OR Nursing (Signed)
PT EXPERIENCING LOWER ABDOMINAL PAIN. CALLING OUT TO NURSE .FOLEY CATH DRAINING CLEAR YELLOW URINE.INCONTINENT LARGE AMOUNT MUCOID THIN DARK BROWN/MARRON COLOR. DR Daphane Shepherd  IN TO EVALUATE PATIENT. SACRUM /COCCYX RED .AND TENDER. ALLEVYN PAD APPLIED

## 2019-05-04 NOTE — Transfer of Care (Signed)
Immediate Anesthesia Transfer of Care Note  Patient: [name]  Procedure(s) Performed: ESOPHAGOGASTRODUODENOSCOPY (EGD) (Left )  Patient Location: PACU  Anesthesia Type:General  Level of Consciousness: awake and sedated  Airway & Oxygen Therapy: Patient Spontanous Breathing and Patient connected to nasal cannula oxygen  Post-op Assessment: Report given to RN  Post vital signs: Reviewed and stable  Last Vitals:  Vitals Value Taken Time  BP    Temp    Pulse    Resp    SpO2      Last Pain:  Vitals:   05/07/2019 1148  TempSrc: Tympanic  PainSc:          Complications: No apparent anesthesia complications

## 2019-05-04 NOTE — Anesthesia Procedure Notes (Signed)
Performed by: Cook-Martin, Leiliana Foody Pre-anesthesia Checklist: Patient identified, Emergency Drugs available, Suction available, Patient being monitored and Timeout performed Patient Re-evaluated:Patient Re-evaluated prior to induction Oxygen Delivery Method: Nasal cannula Preoxygenation: Pre-oxygenation with 100% oxygen Induction Type: IV induction Airway Equipment and Method: Bite block Placement Confirmation: CO2 detector and positive ETCO2       

## 2019-05-04 NOTE — Consult Note (Signed)
Vonda Antigua, MD [address] [place], [place], [place], [address] [address], [place], [place], [place], [address] Phone: [phone]  Fax: [phone]  Consultation  Referring Provider:     Dr. Stark Jock Primary Care Physician:  [name], MD Reason for Consultation:     Dysphagia  Date of Admission:  04/12/2019 Date of Consultation:  05/08/2019         HPI:   Emily [name] is a 85 y.o. female with history of cancer of the hypopharynx, status post radiation and chemotherapy, followed by ENT and reportedly in remission, with GI being consulted for dysphagia as noted by speech pathology.  Patient has dementia, but is able to provide some history.  However, POA is the patient's son who states the patient had a laryngoscopy by ENT in December 2019 or January 2020 and it was normal with no evidence of recurrence.  I do not have dyspnea endoscopy report.  Last endoscopy report available to me is under media and ENT notes which reports a normal laryngoscopy with no recurrence at the time.  Patient had a CT soft tissue neck on this admission which also does not show any evidence of mass in the area.  Also has previous history of rectal cancer with resection in 2014.  Past Medical History:  Diagnosis Date  . Arthritis    fingers  . Cancer ([place])    rectal  . Cancer ([place]) 2012   colon cancer  . Cancer of contiguous sites of hypopharynx ([place]) 12/23/2015  . Colon cancer ([place])   . Difficult intubation   . Diverticulosis   . Dyspnea    SOB  . Hemorrhoids   . History of chicken pox   . HOH (hard of hearing)   . Hypertension   . Osteopenia   . Status post chemotherapy    colon cancer  . Status post radiation therapy    colon cancer 2013    Past Surgical History:  Procedure Laterality Date  . ABDOMINAL HYSTERECTOMY    . ABDOMINAL HYSTERECTOMY  1980   partial  . APPENDECTOMY    . APPENDECTOMY  [id number]   Dr. Pat Patrick  . BREAST CYST ASPIRATION Bilateral    negative  . BREAST SURGERY     cyst removal  . BREAST SURGERY  1960   Breast Biopsy  . CARDIAC CATHETERIZATION N/A 06/23/2016   Procedure: Left Heart Cath and Coronary Angiography;  Surgeon: Corey Skains, MD;  Location: [facility];  Service: Cardiovascular;  Laterality: N/A;  . CATARACT EXTRACTION W/PHACO Left 06/01/2017   Procedure: CATARACT EXTRACTION PHACO AND INTRAOCULAR LENS PLACEMENT (IOC);  Surgeon: Birder Robson, MD;  Location: [facility] ORS;  Service: Ophthalmology;  Laterality: Left;  [place] 00:55 AP% 19.4 CDE 10.71 Fluid pack lot [id number]  . CATARACT EXTRACTION W/PHACO Right 07/06/2017   Procedure: CATARACT EXTRACTION PHACO AND INTRAOCULAR LENS PLACEMENT (IOC);  Surgeon: Birder Robson, MD;  Location: [facility] ORS;  Service: Ophthalmology;  Laterality: Right;  [place]  00:44.7 AP% 18.2 CDE 8.10 Fluid Pack lot # [id number]  . COLON SURGERY  06/28/2013   resected tumor from colon; [facility]  . COLONOSCOPY    . COLONOSCOPY N/A 10/01/2015   Procedure: COLONOSCOPY;  Surgeon: [name], MD;  Location: [facility] ENDOSCOPY;  Service: Endoscopy;  Laterality: N/A;  . DILATION AND CURETTAGE OF UTERUS  1957  . DIRECT LARYNGOSCOPY N/A 12/11/2015   Procedure: DIRECT LARYNGOSCOPY;  Surgeon: Clyde Canterbury, MD;  Location:  [facility] ORS;  Service: ENT;  Laterality: N/A;  . ESOPHAGOSCOPY N/A 12/11/2015   Procedure: ESOPHAGOSCOPY;  Surgeon: Clyde Canterbury, MD;  Location: [facility] ORS;  Service: ENT;  Laterality: N/A;  . EUS N/A 01/20/2013   Procedure: LOWER ENDOSCOPIC ULTRASOUND (EUS);  Surgeon: Milus Banister, MD;  Location: [facility];  Service: Endoscopy;  Laterality: N/A;  . JOINT REPLACEMENT    . PEG PLACEMENT N/A 01/01/2016   Procedure: PERCUTANEOUS ENDOSCOPIC GASTROSTOMY (PEG) PLACEMENT;  Surgeon: [name], MD;  Location: [facility] ORS;  Service: General;  Laterality: N/A;  . PORTACATH PLACEMENT Right 01/01/2016   Procedure: INSERTION PORT-A-CATH;  Surgeon: [name],  MD;  Location: [facility] ORS;  Service: General;  Laterality: Right;  . TONSILLECTOMY    . TOTAL HIP ARTHROPLASTY Right 06/24/2016   Procedure: TOTAL HIP ARTHROPLASTY ANTERIOR APPROACH;  Surgeon: Hessie Knows, MD;  Location: [facility] ORS;  Service: Orthopedics;  Laterality: Right;    Prior to Admission medications   Medication Sig Start Date End Date Taking? Authorizing Provider  bisacodyl (DULCOLAX) 10 MG suppository Place 10 mg rectally as needed for moderate constipation.   Yes [provider]  bisacodyl (DULCOLAX) 5 MG EC tablet Take 5 mg by mouth daily as needed for moderate constipation.   Yes [provider]  Cholecalciferol (VITAMIN D-3) 1000 units CAPS Take 1,000 Units by mouth daily.    Yes [provider]  cyanocobalamin 1000 MCG tablet Take 1,000 mcg by mouth daily.   Yes [provider]  donepezil (ARICEPT) 5 MG tablet Take 1 tablet (5 mg total) by mouth at bedtime. 03/16/18  Yes [name], MD  lisinopril (PRINIVIL,ZESTRIL) 10 MG tablet One tablet daily Patient taking differently: Take 10 mg by mouth daily.  03/17/18  Yes [name], MD  metoCLOPramide (REGLAN) 5 MG tablet Take 5 mg by mouth 4 (four) times daily.   Yes [provider]  metoprolol succinate (TOPROL-XL) 25 MG 24 hr tablet Take 0.5 tablets (12.5 mg total) by mouth daily. 05/16/18  Yes [name], MD  ondansetron (ZOFRAN) 4 MG tablet Take 4 mg by mouth every 6 (six) hours as needed for nausea or vomiting.   Yes [provider]  sennosides-docusate sodium (SENOKOT-S) 8.6-50 MG tablet Take 1 tablet by mouth daily.   Yes [provider]  spironolactone (ALDACTONE) 25 MG tablet Take 25 mg by mouth daily. Mondays,Wednesday, Fridays   Yes [provider]    Family History  Problem Relation Age of Onset  . Kidney failure Father   . Breast cancer Maternal Grandmother      Social History   Tobacco Use  . Smoking status: Former Smoker     Packs/day: 1.00    Years: 64.00    Pack years: 64.00    Types: Cigarettes  . Smokeless tobacco: Never Used  Substance Use Topics  . Alcohol use: Yes    Alcohol/week: 0.0 standard drinks    Comment: rarely  . Drug use: No    Allergies as of 04/30/2019 - Review Complete 05/02/2019  Allergen Reaction Noted  . Amlodipine besylate Other (See Comments) 05/13/2015  . Oxycodone  05/09/2015    Review of Systems:    All systems reviewed and negative except where noted in HPI.   Physical Exam:  Vital signs in last 24 hours: Vitals:   05/03/19 1810 05/03/19 1957 05/06/2019 0321 05/11/2019 1148  BP: (!) 113/53 (!) 121/53 (!) 112/52 (!) 139/50  Pulse: 86 97 98 98  Resp: 20 16 16 20   Temp:  (!) 97.5 F (36.4 C) 97.8 F (36.6 C) (!) 97.3 F (36.3 C)  TempSrc:  Oral Oral Tympanic  SpO2: 99% 94% 95% 94%  Weight:      Height:       Last BM Date: 05/03/19 General:   Pleasant, cooperative in NAD Head:  Normocephalic and atraumatic. Eyes:   No icterus.   Conjunctiva pink. PERRLA. Ears:  Normal auditory acuity. Neck:  Supple; no masses or thyroidomegaly Lungs: Respirations even and unlabored. Lungs clear to auscultation bilaterally.   No wheezes, crackles, or rhonchi.  Abdomen:  Soft, nondistended, nontender. Normal bowel sounds. No appreciable masses or hepatomegaly.  No rebound or guarding.  Neurologic:  Alert and oriented x3;  grossly normal neurologically. Skin:  Intact without significant lesions or rashes. Cervical Nodes:  No significant cervical adenopathy. Psych:  Alert and cooperative. Normal affect.  LAB RESULTS: Recent Labs    05/02/2019 1822 05/02/19 0431 05/03/19 0556  WBC 10.4 8.2 6.9  HGB 14.1 11.9* 12.0  HCT 42.5 36.8 37.8  PLT 152 134* 151   BMET Recent Labs    05/02/19 0431 05/03/19 0556 05/12/2019 0418  NA 136 140 142  K 4.1 3.7 3.8  CL 103 108 113*  CO2 21* 18* 17*  GLUCOSE 79 93 86  BUN 76* 70* 62*  CREATININE 3.48* 2.67* 2.08*  CALCIUM 8.0* 8.3* 8.2*    LFT Recent Labs    04/27/2019 1822  PROT 5.9*  ALBUMIN 2.7*  AST 28  ALT 16  ALKPHOS 607*  BILITOT 0.6   PT/INR No results for input(s): LABPROT, INR in the last 72 hours.  STUDIES: Dg Chest 1 View  Result Date: 05/03/2019 CLINICAL DATA:  Cough EXAM: CHEST  1 VIEW COMPARISON:  07/18/2016 FINDINGS: Previous right power port has been removed. Heart size is normal. Chronic aortic atherosclerosis. Chronic interstitial lung markings. Question mild patchy infiltrate in the left base. No dense consolidation or lobar collapse. No effusion. No acute bone finding. IMPRESSION: Chronic interstitial lung markings. Question slight increase in patchy density at the left base which could be atelectasis or mild pneumonia. Electronically Signed   By: Nelson Chimes M.D.   On: 05/03/2019 09:25   Dg Abd 1 View  Result Date: 05/27/2019 CLINICAL DATA:  Abdominal distension EXAM: ABDOMEN - 1 VIEW COMPARISON:  05/13/2013 abdominal CT FINDINGS: Relatively gasless abdomen with no evidence of obstruction. No concerning mass effect or gas collection. Postoperative changes in the pelvis with bowel sutures. Atherosclerotic calcification. Clear lung bases. IMPRESSION: Nonobstructive bowel gas pattern.  No abnormal stool retention. Electronically Signed   By: Monte Fantasia M.D.   On: 05/16/2019 08:46   Ct Soft Tissue Neck Wo Contrast  Result Date: 05/13/2019 CLINICAL DATA:  Neck mass with history of malignancy EXAM: CT NECK WITHOUT CONTRAST TECHNIQUE: Multidetector CT imaging of the neck was performed following the standard protocol without intravenous contrast. COMPARISON:  None. FINDINGS: Pharynx and larynx: No evidence of mass or swelling Salivary glands: No inflammation, mass, or stone. Thyroid: Small gland that is otherwise unremarkable Lymph nodes: History of colorectal cancer. No evidence of adenopathy in the neck with close attention to the left supraclavicular fossa. I do not see any palpable markers to correlate  with history of neck mass. Vascular: Atherosclerotic calcification. Limited intracranial: Negative Visualized orbits: Bilateral cataract resection Mastoids and visualized paranasal sinuses: Clear Skeleton: Partially covered sclerotic lesion in the T5 vertebra. Elsewhere bones are osteopenic. Upper chest: Small (where  visualized) layering pleural effusions. There is airway thickening and mild ground-glass opacity in the right upper lobe. IMPRESSION: 1. No evidence of malignancy in the neck. 2. Sclerotic focus in the T5 vertebra, partially seen but suspicious for metastatic focus. Most recent staging scans are from 2017, is there outside staging available? 3. Small pleural effusions and mild nonspecific airspace opacity in the right upper lobe. Electronically Signed   By: Monte Fantasia M.D.   On: 05/07/2019 11:09      Impression / Plan:   ZITLALI PRIMM is a 85 y.o. y/o female with dysphagia  Speech pathology note states she does not have oropharyngeal phase deficits but demonstrated expectoration of phlegm with gagging and coughing on trials of liquid and pure consistencies.  Patient herself denies any abdominal pain and does not know if she has had trouble swallowing  Since CT did not report any evidence of tumor recurrence, EGD is indicated for further evaluation of dysphagia  I discussed things extensively with the patient's son, Emily [name] and he would like further evaluation with upper endoscopy.  His question is given previous radiation if she has developed a stricture.  However, he denies any previous need for dilations.  Last upper endoscopy was in 2017 by Dr. Bary Castilla that reported the previous mass of the hypopharynx that was since treated  I have discussed alternative options, risks & benefits,  which include, but are not limited to, bleeding, infection, perforation,respiratory complication & drug reaction.  The patient and family agrees with this plan & written consent will be obtained.      Thank you for involving me in the care of this patient.      LOS: 3 days   Virgel Manifold, MD  05/28/2019, 12:23 PM

## 2019-05-04 NOTE — Progress Notes (Signed)
Physical Therapy Cancellation Note:  PT treatment not completed: Pt out of room at test/proceedure and unavailable.  Will attempt to see pt at a future date/time as appropriate.    Linus Salmons PT, DPT 05/31/2019, 2:10 PM

## 2019-05-04 NOTE — Anesthesia Post-op Follow-up Note (Signed)
Anesthesia QCDR form completed.        

## 2019-05-04 NOTE — Progress Notes (Addendum)
05/13/2019 4:05 PM   [name] [birth year] [id number]  CC: Unable to place Foley, possible urinary retention  HPI: I was asked to see Ms. Cronkright by Dr. Stark Jock for inability to place Foley catheter and possible urinary retention.  She is an extremely frail appearing and comorbid 85 year old female with DNR CODE STATUS who is currently admitted from a facility for elevated creatinine and AKI.  On admission, creatinine was elevated to 3.6 from a baseline of 1.0, thought to be secondary to dehydration.  She was evaluated with a renal ultrasound that showed medical renal disease, but no frank hydronephrosis, and bladder was only partially distended.  A catheter was placed and she was treated with IV hydration.  Urine culture from 5/29 showed no growth, and no urinalysis or culture was sent this hospitalization.  She reportedly had a 71 French catheter previously, however this was removed today when she complained of lower abdominal pain with bladder scan over 600 cc.  RN was unable to replace Foley and urology was consulted.  There is no cross-sectional imaging to review.  No history is able to be obtained from the patient secondary to her current condition.  Her history is also notable for rectal cancer treated with chemotherapy and radiation, as well as cancer of the hypopharynx.   PMH: Past Medical History:  Diagnosis Date  . Arthritis    fingers  . Cancer ([place])    rectal  . Cancer ([place]) 2012   colon cancer  . Cancer of contiguous sites of hypopharynx ([place]) 12/23/2015  . Colon cancer ([place])   . Difficult intubation   . Diverticulosis   . Dyspnea    SOB  . Hemorrhoids   . History of chicken pox   . HOH (hard of hearing)   . Hypertension   . Osteopenia   . Status post chemotherapy    colon cancer  . Status post radiation therapy    colon cancer 2013    Surgical History: Past Surgical History:  Procedure Laterality Date  . ABDOMINAL HYSTERECTOMY    . ABDOMINAL  HYSTERECTOMY  1980   partial  . APPENDECTOMY    . APPENDECTOMY  [id number]   Dr. Pat Patrick  . BREAST CYST ASPIRATION Bilateral    negative  . BREAST SURGERY     cyst removal  . BREAST SURGERY  1960   Breast Biopsy  . CARDIAC CATHETERIZATION N/A 06/23/2016   Procedure: Left Heart Cath and Coronary Angiography;  Surgeon: Corey Skains, MD;  Location: [facility];  Service: Cardiovascular;  Laterality: N/A;  . CATARACT EXTRACTION W/PHACO Left 06/01/2017   Procedure: CATARACT EXTRACTION PHACO AND INTRAOCULAR LENS PLACEMENT (IOC);  Surgeon: Birder Robson, MD;  Location: [facility] ORS;  Service: Ophthalmology;  Laterality: Left;  [place] 00:55 AP% 19.4 CDE 10.71 Fluid pack lot [id number]  . CATARACT EXTRACTION W/PHACO Right 07/06/2017   Procedure: CATARACT EXTRACTION PHACO AND INTRAOCULAR LENS PLACEMENT (IOC);  Surgeon: Birder Robson, MD;  Location: [facility] OR[facility];  Service: Ophthalmology;  Laterality: Right;  [place]  00:44.7 AP% 18.2 CDE 8.10 Fluid Pack lot # [id number]  . COLON SURGERY  06/28/2013   resected tumor from colon; [facility]  . COLONOSCOPY    . COLONOSCOPY N/A 10/01/2015   Procedure: COLONOSCOPY;  Surgeon: [name], MD;  Location: [facility] ENDOSCOPY;  Service: Endoscopy;  Laterality: N/A;  . DILATION AND CURETTAGE OF UTERUS  1957  . DIRECT LARYNGOSCOPY N/A 12/11/2015   Procedure: DIRECT LARYNGOSCOPY;  Surgeon:  Clyde Canterbury, MD;  Location: [facility] ORS;  Service: ENT;  Laterality: N/A;  . ESOPHAGOGASTRODUODENOSCOPY Left 05/09/2019   Procedure: ESOPHAGOGASTRODUODENOSCOPY (EGD);  Surgeon: Virgel Manifold, MD;  Location: [facility] ENDOSCOPY;  Service: Endoscopy;  Laterality: Left;  . ESOPHAGOSCOPY N/A 12/11/2015   Procedure: ESOPHAGOSCOPY;  Surgeon: Clyde Canterbury, MD;  Location: [facility] ORS;  Service: ENT;  Laterality: N/A;  . EUS N/A 01/20/2013   Procedure: LOWER ENDOSCOPIC ULTRASOUND (EUS);  Surgeon: Milus Banister, MD;  Location: [facility];  Service: Endoscopy;  Laterality:  N/A;  . JOINT REPLACEMENT    . PEG PLACEMENT N/A 01/01/2016   Procedure: PERCUTANEOUS ENDOSCOPIC GASTROSTOMY (PEG) PLACEMENT;  Surgeon: [name], MD;  Location: [facility] ORS;  Service: General;  Laterality: N/A;  . PORTACATH PLACEMENT Right 01/01/2016   Procedure: INSERTION PORT-A-CATH;  Surgeon: [name], MD;  Location: [facility] ORS;  Service: General;  Laterality: Right;  . TONSILLECTOMY    . TOTAL HIP ARTHROPLASTY Right 06/24/2016   Procedure: TOTAL HIP ARTHROPLASTY ANTERIOR APPROACH;  Surgeon: Hessie Knows, MD;  Location: [facility] ORS;  Service: Orthopedics;  Laterality: Right;   Allergies:  Allergies  Allergen Reactions  . Amlodipine Besylate Other (See Comments)    Patient unaware of any allergy with this medicine  . Oxycodone     confusion    Family History: Family History  Problem Relation Age of Onset  . Kidney failure Father   . Breast cancer Maternal Grandmother     Social History:  reports that she has quit smoking. Her smoking use included cigarettes. She has a 64.00 pack-year smoking history. She has never used smokeless tobacco. She reports current alcohol use. She reports that she does not use drugs.  ROS: Please see flowsheet from today's date for complete review of systems.  Physical Exam: BP 104/60 (BP Location: Left Arm)   Pulse 98   Temp 97.9 F (36.6 C) (Axillary)   Resp 16   Ht 5\' 4"  (1.626 m)   Wt 53.4 kg   SpO2 94%   BMI 20.21 kg/m    Constitutional: Frail-appearing, cachectic Cardiovascular: No clubbing, cyanosis, or edema. Respiratory: Normal respiratory effort, no increased work of breathing. GI: Abdomen is diffusely distended, tympanitic, nontender GU: No CVA tenderness, significant groin and labial edema Lymph: No cervical or inguinal lymphadenopathy. Skin: No rashes, bruises or suspicious lesions.  Laboratory Data: Reviewed  Pertinent Imaging: Renal ultrasound dated 05/02/2019 with no frank hydronephrosis, partially distended  bladder  Procedure: Patient was prepped and draped in standard sterile fashion.  An 57 [place] Coloplast coud Foley passed easily into the bladder with return of light pink urine.  10 cc were placed in the balloon.  Only 15 cc of urine drained.  She did not tolerate irrigation with normal saline secondary to bladder spasm.  Catheter was connected to dependent drainage.  Assessment & Plan:   In summary, the patient is an 85 year old very frail appearing and co-morbid female admitted from long-term care facility with AKI and renal failure.  I have a high suspicion that she has renal failure and oliguria with diffuse ascites resulting in falsely elevated bladder scan.  Primary team is ordering a CT of the abdomen pelvis this evening, we will follow-up results.  If bladder decompressed with Foley in place would maintain catheter in the setting of this critically ill patient.  She is DNR CODE STATUS, and per primary team and nursing, they anticipate a more palliative approach with likely hospice for this patient.  -Maintain Foley, okay  to irrigate as needed -Consider belladonna suppositories for bladder spasms -Will follow-up CT to confirm catheter in the bladder and ascites as etiology of elevated bladder scan -Call if questions  ADDENDUM: CT reviewed, massive ascites. Bladder appears decompressed, though limited by artifact from artificial hip. Low suspicion for bladder perforation with downtrending sCr and value of only 2. Could consider CT cystogram to r/u bladder injury, but low suspicion. She would not be a surgical candidate for any bladder repair, and would manage with foley drainage regardless.  Billey Co, [facility] [address] [place], [place] [address] [phone]

## 2019-05-04 NOTE — Anesthesia Postprocedure Evaluation (Signed)
Anesthesia Post Note  Patient: Emily [name]  Procedure(s) Performed: ESOPHAGOGASTRODUODENOSCOPY (EGD) (Left )  Patient location during evaluation: Endoscopy Anesthesia Type: General Level of consciousness: awake and alert Pain management: pain level controlled Vital Signs Assessment: post-procedure vital signs reviewed and stable Respiratory status: spontaneous breathing, nonlabored ventilation, respiratory function stable and patient connected to nasal cannula oxygen Cardiovascular status: blood pressure returned to baseline and stable Postop Assessment: no apparent nausea or vomiting Anesthetic complications: no     Last Vitals:  Vitals:   05/29/2019 1349 05/28/2019 1424  BP: (!) 94/45 104/60  Pulse: 99 98  Resp: 13 16  Temp:  36.6 C  SpO2: 94% 94%    Last Pain:  Vitals:   05/18/2019 1424  TempSrc: Axillary  PainSc:                  Koralyn Prestage S

## 2019-05-05 ENCOUNTER — Inpatient Hospital Stay: Payer: Medicare Other

## 2019-05-05 DIAGNOSIS — I864 Gastric varices: Secondary | ICD-10-CM

## 2019-05-05 DIAGNOSIS — R109 Unspecified abdominal pain: Secondary | ICD-10-CM

## 2019-05-05 DIAGNOSIS — R1084 Generalized abdominal pain: Secondary | ICD-10-CM

## 2019-05-05 LAB — BASIC METABOLIC PANEL
Anion gap: 5 (ref 5–15)
BUN: 50 mg/dL — ABNORMAL HIGH (ref 8–23)
CO2: 21 mmol/L — ABNORMAL LOW (ref 22–32)
Calcium: 8.1 mg/dL — ABNORMAL LOW (ref 8.9–10.3)
Chloride: 119 mmol/L — ABNORMAL HIGH (ref 98–111)
Creatinine, Ser: 1.8 mg/dL — ABNORMAL HIGH (ref 0.44–1.00)
GFR calc Af Amer: 30 mL/min — ABNORMAL LOW (ref >60)
GFR calc non Af Amer: 26 mL/min — ABNORMAL LOW (ref >60)
Glucose, Bld: 195 mg/dL — ABNORMAL HIGH (ref 70–99)
Potassium: 3.4 mmol/L — ABNORMAL LOW (ref 3.5–5.1)
Sodium: 145 mmol/L (ref 135–145)

## 2019-05-05 LAB — CBC
HCT: 30.9 % — ABNORMAL LOW (ref 36.0–46.0)
Hemoglobin: 10 g/dL — ABNORMAL LOW (ref 12.0–15.0)
MCH: 29.2 pg (ref 26.0–34.0)
MCHC: 32.4 g/dL (ref 30.0–36.0)
MCV: 90.1 fL (ref 80.0–100.0)
Platelets: 87 10*3/uL — ABNORMAL LOW (ref 150–400)
RBC: 3.43 MIL/uL — ABNORMAL LOW (ref 3.87–5.11)
RDW: 15.3 % (ref 11.5–15.5)
WBC: 5.6 10*3/uL (ref 4.0–10.5)
nRBC: 0 % (ref 0.0–0.2)

## 2019-05-05 LAB — MAGNESIUM: Magnesium: 1.6 mg/dL — ABNORMAL LOW (ref 1.7–2.4)

## 2019-05-05 MED ORDER — POTASSIUM CHLORIDE CRYS ER 20 MEQ PO TBCR
40.0000 meq | EXTENDED_RELEASE_TABLET | Freq: Once | ORAL | Status: DC
  Filled 2019-05-05: qty 2

## 2019-05-05 MED ORDER — MAGNESIUM OXIDE 400 (241.3 MG) MG PO TABS
800.0000 mg | ORAL_TABLET | Freq: Once | ORAL | Status: DC
  Filled 2019-05-05: qty 2

## 2019-05-05 NOTE — TOC Progression Note (Signed)
Transition of Care [facility]) - Progression Note    Patient Details  Name: Emily [name] MRN: [id number] Date of Birth: 1933/04/16  [facility] CM/SW Contact  Shelbie Hutching, RN Phone Number: 05/05/2019, 12:03 PM  Clinical Narrative:    RNCM spoke with patient's son [name] about plans for discharge.  Patient came to the hospital from [facility], patient's son reports she was there for rehab but had been at [facility] before that.  Palliative discussed goals of care with patient's son yesterday and he voiced interest in hospice.  [name] today reports that hospice at SNF would be fine with him.  [name] reports that he prefers patient no go back to [facility].  Bed search started and referral given to [facility] with [facility].  Patient is already being followed by outpatient palliative.    Expected Discharge Plan: Long Term Nursing Home Barriers to Discharge: Continued Medical Work up  Expected Discharge Plan and Services Expected Discharge Plan: [place]       Living arrangements for the past 2 months: [place] Expected Discharge Date: 05/29/2019                                     Social Determinants of Health (SDOH) Interventions    Readmission Risk Interventions Readmission Risk Prevention Plan 05/02/2019 05/02/2019  Transportation Screening - Complete  HRI or [place] - Not Complete  HRI or Home Care Consult comments Pt came from SNF rehab. She may return there at dc vs. ALF vs. home with hospice Pt resides at [facility] in [place] for [place] Planning/Counseling - Complete  Some recent data might be hidden

## 2019-05-05 NOTE — Progress Notes (Signed)
[place] at [place] NAME: Emily [name]    MR#:  [id number]  DATE OF BIRTH:  1933-01-18  SUBJECTIVE:  CHIEF COMPLAINT:   Chief Complaint  Patient presents with   Abnormal Lab   No new complaint this morning.  No fevers.  No abdominal pains.  Patient tolerating current diet.   REVIEW OF SYSTEMS:  ROS unobtainable due to patient's mental status  DRUG ALLERGIES:   Allergies  Allergen Reactions   Amlodipine Besylate Other (See Comments)    Patient unaware of any allergy with this medicine   Oxycodone     confusion   VITALS:  Blood pressure 140/76, pulse 99, temperature 97.7 F (36.5 C), temperature source Oral, resp. rate 16, height 5\' 4"  (1.626 m), weight 53.4 kg, SpO2 95 %. PHYSICAL EXAMINATION:  Physical Exam   GENERAL:  85 y.o.-year-old patient lying in the bed with no acute distress.  EYES: Pupils equal, round, reactive to light and accommodation. No scleral icterus. Extraocular muscles intact.  Left frontal scalp hematoma; present on admission. HEENT: Head atraumatic, normocephalic. Oropharynx and nasopharynx clear.  NECK:  Supple, no jugular venous distention. No thyroid enlargement, no tenderness.  LUNGS: Normal breath sounds bilaterally, no wheezing, rales,rhonchi or crepitation. No use of accessory muscles of respiration.  CARDIOVASCULAR: S1, S2 normal. No murmurs, rubs, or gallops.  ABDOMEN: Soft, nontender, nondistended. Bowel sounds present. No organomegaly or mass.  EXTREMITIES: No pedal edema, cyanosis, or clubbing.  NEUROLOGIC: Unable to exam.  Patient pleasantly confused. PSYCHIATRIC: The patient is demented.  LABORATORY PANEL:  Female CBC Recent Labs  Lab 05/05/19 0614  WBC 5.6  HGB 10.0*  HCT 30.9*  PLT 87*   ------------------------------------------------------------------------------------------------------------------ Chemistries  Recent Labs  Lab 04/18/2019 1822  05/05/19 0614  NA 134*    < > 145  K 4.7   < > 3.4*  CL 97*   < > 119*  CO2 25   < > 21*  GLUCOSE 94   < > 195*  BUN 74*   < > 50*  CREATININE 3.59*   < > 1.80*  CALCIUM 8.7*   < > 8.1*  MG  --    < > 1.6*  AST 28  --   --   ALT 16  --   --   ALKPHOS 607*  --   --   BILITOT 0.6  --   --    < > = values in this interval not displayed.   RADIOLOGY:  Ct Abdomen Pelvis Wo Contrast  Result Date: 05/20/2019 CLINICAL DATA:  85 year old female with abdominal pain and cephalopathy. History of rectal and head and neck cancer. EXAM: CT ABDOMEN AND PELVIS WITHOUT CONTRAST TECHNIQUE: Multidetector CT imaging of the abdomen and pelvis was performed following the standard protocol without IV contrast. COMPARISON:  PET-CT 05/14/2016 and earlier. FINDINGS: Lower chest: Small bilateral layering pleural effusions are new since 2017. No pericardial effusion. Minor lung base compressive atelectasis. Hepatobiliary: Large volume ascites with simple fluid density. Small cirrhotic appearing liver which has changed in morphology since 2017. No discrete liver lesion is evident in the absence of contrast. There is a 10 millimeter gallstone identified on series 2, image 27. There is decreased gallbladder detail due to ascites. Pancreas: Mild chronic calcific pancreatitis is stable. Spleen: Spleen size remains normal. Adrenals/Urinary Tract: Negative adrenal glands. There are bilateral renal vascular calcifications. Noncontrast kidneys appears stable since 2017. There are chronic left gonadal vein phleboliths. The ureters are  difficult to delineate. There is a urethral catheter in the bladder which is poorly delineated due to a combination of ascites and streak artifact from right hip arthroplasty. Stomach/Bowel: No dilated large or small bowel loops. The stomach is decompressed. Individual bowel loops are difficult to delineate due to the large volume ascites. No pneumoperitoneum. Vascular/Lymphatic: Severe Aortoiliac calcified atherosclerosis. Vascular  patency is not evaluated in the absence of IV contrast. No lymphadenopathy is evident. Reproductive: Surgically absent. Other: Moderate to large pelvic ascites. Musculoskeletal: There is a new small round sclerotic lesion measuring 4-5 millimeters in the left pedicle of T11 on series 2, image 8. Underlying chronically heterogeneous bone mineralization, but multiple other areas of small new sclerotic foci are noted in the lumbar spine. Right hip arthroplasty is new since 2017. No destructive osseous lesion identified. IMPRESSION: 1. Cirrhotic liver with Large Volume Ascites. Small bilateral layering pleural effusions. No discrete liver lesion in the absence of IV contrast. Spleen size remains normal. 2. Multiple small new sclerotic foci in the spine since 2017 are suspicious for sclerotic bone metastases. 3. Severe Aortic Atherosclerosis (ICD10-I70.0). 4. Cholelithiasis. Electronically Signed   By: Genevie Ann M.D.   On: 05/27/2019 17:16   Ct Head Wo Contrast  Result Date: 05/18/2019 CLINICAL DATA:  85 year old female with encephalopathy, abdominal pain. History of rectal carcinoma and head and neck cancer. EXAM: CT HEAD WITHOUT CONTRAST TECHNIQUE: Contiguous axial images were obtained from the base of the skull through the vertex without intravenous contrast. COMPARISON:  Head CT 04/01/2019 and earlier. FINDINGS: Brain: Stable cerebral volume. No ventriculomegaly. No midline shift, mass effect, or evidence of intracranial mass lesion. Stable and largely normal for age gray-white matter differentiation throughout the brain. No cortically based acute infarct identified. No acute intracranial hemorrhage identified. Vascular: Extensive Calcified atherosclerosis at the skull base. Skull: Stable.  No acute osseous abnormality identified. Sinuses/Orbits: Visualized paranasal sinuses and mastoids are stable and well pneumatized. Other: Questionable mild left forehead scalp hematoma on series 3, image 6. Underlying left  frontal bone intact. Other visible scalp and orbits soft tissues appear negative. IMPRESSION: 1. Questionable mild left forehead scalp hematoma. No underlying skull fracture. 2. Stable noncontrast CT appearance of the brain, with generalized cerebral volume loss but otherwise negative for age. Electronically Signed   By: Genevie Ann M.D.   On: 05/26/2019 16:57   US Liver Doppler  Result Date: 05/05/2019 CLINICAL DATA:  Gastric varices. EXAM: DUPLEX ULTRASOUND OF LIVER TECHNIQUE: Color and duplex Doppler ultrasound was performed to evaluate the hepatic in-flow and out-flow vessels. COMPARISON:  CT abdomen pelvis from yesterday. FINDINGS: Liver: Coarsened heterogeneous parenchymal echogenicity. Nodular contour. No focal lesion, mass or intrahepatic biliary ductal dilatation. Main Portal Vein size: 0.9 cm Portal Vein Velocities Main Prox:  45 cm/sec Main Mid: 52 cm/sec Main Dist:  60 cm/sec Right: 23 cm/sec Left: 13 cm/sec Normal hepatopetal flow in the portal veins. Hepatic Vein Velocities Right:  28 cm/sec Middle:  24 cm/sec Left:  28 cm/sec Normal hepatofugal flow in the hepatic veins. IVC: Present and patent with normal respiratory phasicity. Hepatic Artery Velocity:  113 cm/sec Splenic Vein Velocity:  31 cm/sec Spleen: 5.9 cm x 4.0 cm x 6.0 cm with a total volume of 74 cm^3 (411 cm^3 is upper limit normal) Portal Vein Occlusion/Thrombus: No Splenic Vein Occlusion/Thrombus: No Ascites: Present Varices: None IMPRESSION: 1. Cirrhosis with portal hypertension and ascites. 2. Patent hepatic vasculature with appropriately directed flow. Electronically Signed   By: Orville Govern.D.  On: 05/05/2019 12:06   ASSESSMENT AND PLAN:   1. Acute kidney injury  Felt to be secondary to dehydration.   Being hydrated with IV fluids with gradual improvement in renal function.  Renal ultrasound with evidence of medical renal disease.  Minimal collecting system dilatation bilaterally.Lisinopril placed on hold.  Decreased rate  of IV fluids. Appreciate nephrology input  2. Hyponatremia.   Likely due to dehydration.  Resolved with IV fluid hydration  3. Hypertension.  Hold lisinopril and Lopressor due to low side blood pressure.  Consider resuming Lopressor if blood pressure begins to trend up  4. Dementia.  Aspiration and fall precaution Stable.  Patient pleasantly confused. Patient noted to be currently being followed by outpatient palliative at [facility].  5.  Dysphagia Nursing staff reported coughing while swallowing. *Chest x-ray showed chronic interstitial lung markings.  Increasing patchy density in the left base which could be atelectasis or mild pneumonia.  Clinically no fevers.  No leukocytosis.  No clinical evidence to support pneumonia. Seen by speech therapist and recommended GI evaluation. Discussed with patient's son  who confirmed patient has had prior history of neck cancer status post radiation treatment.  Son reported she had follow-up laryngoscopy couple of months ago which was unremarkable. CT neck without contrast ordered to rule out recurrence of neck cancer. Placed on PPI IV. Patient had EGD done which revealed reflux esophagitis.  There was mild resistance while passing the scope past the vocal cords in the upper esophagus.  No stricture or narrowing.  6.  Acute urinary retention Patient with lower abdominal pain. Patient seen by urologist.  Appreciate input.  Recommendation is to maintain Foley catheter which was placed.  To consider belladonna suppository for bladder spasms.  CT scan reviewed massive ascites and bladder.  Decompressed.  No plans for any further urological procedure at this time.  7.  Liver cirrhosis with massive ascites Patient very comfortable this morning with no abdominal pains.  Abdomen soft. Discussed with son about new diagnosis of liver cirrhosis.  Evidence of portal hypertension on liver ultrasound. Discussed doing paracentesis via ultrasound guidance with  son.  Since patient appears comfortable and is high risk of reaccumulation of ascites, son wishes to hold off on paracentesis for now.  8.  New findings of multiple sclerotic foci in the lumbar spine. Discussed findings with son as well. Prior history of neck cancer status post treatment. Son does not wish to adjust any further since patient cannot withstand any surgery or chemotherapy at this time.  Focus to be primarily on keeping patient comfortable.  Palliative care already consulted. Case manager working on discharge with hospice to skilled nursing facility after the weekend.  DVT prophylaxis; heparin   All the records are reviewed and case discussed with Care Management/Social Worker. Called and discussed with patient's son; Mr. [name] over the phone was monitored and updated him on treatment plans.  All questions answered.  He agrees with plan of care as outlined above  CODE STATUS: DNR  TOTAL TIME TAKING CARE OF THIS PATIENT: 39 minutes.   More than 50% of the time was spent in counseling/coordination of care: YES  POSSIBLE D/C IN 2 DAYS, DEPENDING ON CLINICAL CONDITION.   Aleyza Salmi M.D on 05/05/2019 at 12:49 PM  Between 7am to 6pm - Pager - [phone]  After 6pm go to [email or web address] - Proofreader  [facility]  Office  [phone]  CC: Primary care physician; [name], MD  Note: This  dictation was prepared with Dragon dictation along with smaller phrase technology. Any transcriptional errors that result from this process are unintentional.

## 2019-05-05 NOTE — Progress Notes (Signed)
Emily Lame, MD [facility]   [address] [place], [place] [address] Phone: [phone] Fax : [phone]   Subjective: This patient is being followed up for a distended abdomen with abdominal discomfort and an EGD that showed her to have gastric varices.  The patient had a CT scan yesterday that showed:  IMPRESSION: 1. Cirrhotic liver with Large Volume Ascites. Small bilateral layering pleural effusions. No discrete liver lesion in the absence of IV contrast. Spleen size remains normal. 2. Multiple small new sclerotic foci in the spine since 2017 are suspicious for sclerotic bone metastases. 3. Severe Aortic Atherosclerosis (ICD10-I70.0). 4. Cholelithiasis.  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  The patient had a Doppler ultrasound that showed cirrhosis and portal hypertension with normal direction of the portal blood flow.   Objective: Vital signs in last 24 hours: Vitals:   05/11/2019 1349 05/14/2019 1424 05/23/2019 2052 05/05/19 0518  BP: (!) 94/45 104/60 (!) 121/53 140/76  Pulse: 99 98 94 99  Resp: 13 16    Temp:  97.9 F (36.6 C) (!) 97.5 F (36.4 C) 97.7 F (36.5 C)  TempSrc:  Axillary Oral Oral  SpO2: 94% 94% 97% 95%  Weight:      Height:       Weight change:   Intake/Output Summary (Last 24 hours) at 05/05/2019 1222 Last data filed at 05/05/2019 0545 Gross per 24 hour  Intake 760.83 ml  Output 475 ml  Net 285.83 ml     Exam: Heart:: Regular rate and rhythm, S1S2 present or without murmur or extra heart sounds Lungs: normal and clear to auscultation and percussion Abdomen: Tense, positive diffuse tenderness with distention, normal bowel sounds   Lab Results: @LABTEST2 @ Micro Results: Recent Results (from the past 240 hour(s))  SARS Coronavirus 2 (CEPHEID - Performed in [facility] hospital lab), Hosp Order     Status: None   Collection Time: 04/20/2019  8:37 PM   Specimen: Nasopharyngeal Swab  Result Value Ref Range  Status   SARS Coronavirus 2 NEGATIVE NEGATIVE Final    Comment: (NOTE) If result is NEGATIVE SARS-CoV-2 target nucleic acids are NOT DETECTED. The SARS-CoV-2 RNA is generally detectable in upper and lower  respiratory specimens during the acute phase of infection. The lowest  concentration of SARS-CoV-2 viral copies this assay can detect is 250  copies / mL. A negative result does not preclude SARS-CoV-2 infection  and should not be used as the sole basis for treatment or other  patient management decisions.  A negative result may occur with  improper specimen collection / handling, submission of specimen other  than nasopharyngeal swab, presence of viral mutation(s) within the  areas targeted by this assay, and inadequate number of viral copies  (<250 copies / mL). A negative result must be combined with clinical  observations, patient history, and epidemiological information. If result is POSITIVE SARS-CoV-2 target nucleic acids are DETECTED. The SARS-CoV-2 RNA is generally detectable in upper and lower  respiratory specimens dur ing the acute phase of infection.  Positive  results are indicative of active infection with SARS-CoV-2.  Clinical  correlation with patient history and other diagnostic information is  necessary to determine patient infection status.  Positive results do  not rule out bacterial infection or co-infection with other viruses. If result is PRESUMPTIVE POSTIVE SARS-CoV-2 nucleic acids MAY BE PRESENT.   A presumptive positive result was obtained on the submitted specimen  and confirmed on repeat testing.  While  2019 novel coronavirus  (SARS-CoV-2) nucleic acids may be present in the submitted sample  additional confirmatory testing may be necessary for epidemiological  and / or clinical management purposes  to differentiate between  SARS-CoV-2 and other Sarbecovirus currently known to infect humans.  If clinically indicated additional testing with an alternate  test  methodology [phone]) is advised. The SARS-CoV-2 RNA is generally  detectable in upper and lower respiratory sp ecimens during the acute  phase of infection. The expected result is Negative. Fact Sheet for Patients:  StrictlyIdeas.no Fact Sheet for Healthcare Providers: BankingDealers.co.za This test is not yet approved or cleared by the [place] FDA and has been authorized for detection and/or diagnosis of SARS-CoV-2 by FDA under an Emergency Use Authorization (EUA).  This EUA will remain in effect (meaning this test can be used) for the duration of the COVID-19 declaration under Section 564(b)(1) of the Act, 21 U.S.C. section 360bbb-3(b)(1), unless the authorization is terminated or revoked sooner. Performed at [facility], [address] [place], [place] [address]   MRSA PCR Screening     Status: None   Collection Time: 05/02/19  5:10 PM   Specimen: Nasal Mucosa; Nasopharyngeal  Result Value Ref Range Status   MRSA by PCR NEGATIVE NEGATIVE Final    Comment:        The GeneXpert MRSA Assay (FDA approved for NASAL specimens only), is one component of a comprehensive MRSA colonization surveillance program. It is not intended to diagnose MRSA infection nor to guide or monitor treatment for MRSA infections. Performed at [facility], [place], [place] [address]    Studies/Results: Ct Abdomen Pelvis Wo Contrast  Result Date: 05/26/2019 CLINICAL DATA:  85 year old female with abdominal pain and cephalopathy. History of rectal and head and neck cancer. EXAM: CT ABDOMEN AND PELVIS WITHOUT CONTRAST TECHNIQUE: Multidetector CT imaging of the abdomen and pelvis was performed following the standard protocol without IV contrast. COMPARISON:  PET-CT 05/14/2016 and earlier. FINDINGS: Lower chest: Small bilateral layering pleural effusions are new since 2017. No pericardial effusion. Minor lung base  compressive atelectasis. Hepatobiliary: Large volume ascites with simple fluid density. Small cirrhotic appearing liver which has changed in morphology since 2017. No discrete liver lesion is evident in the absence of contrast. There is a 10 millimeter gallstone identified on series 2, image 27. There is decreased gallbladder detail due to ascites. Pancreas: Mild chronic calcific pancreatitis is stable. Spleen: Spleen size remains normal. Adrenals/Urinary Tract: Negative adrenal glands. There are bilateral renal vascular calcifications. Noncontrast kidneys appears stable since 2017. There are chronic left gonadal vein phleboliths. The ureters are difficult to delineate. There is a urethral catheter in the bladder which is poorly delineated due to a combination of ascites and streak artifact from right hip arthroplasty. Stomach/Bowel: No dilated large or small bowel loops. The stomach is decompressed. Individual bowel loops are difficult to delineate due to the large volume ascites. No pneumoperitoneum. Vascular/Lymphatic: Severe Aortoiliac calcified atherosclerosis. Vascular patency is not evaluated in the absence of IV contrast. No lymphadenopathy is evident. Reproductive: Surgically absent. Other: Moderate to large pelvic ascites. Musculoskeletal: There is a new small round sclerotic lesion measuring 4-5 millimeters in the left pedicle of T11 on series 2, image 8. Underlying chronically heterogeneous bone mineralization, but multiple other areas of small new sclerotic foci are noted in the lumbar spine. Right hip arthroplasty is new since 2017. No destructive osseous lesion identified. IMPRESSION: 1. Cirrhotic liver with Large Volume Ascites. Small bilateral layering  pleural effusions. No discrete liver lesion in the absence of IV contrast. Spleen size remains normal. 2. Multiple small new sclerotic foci in the spine since 2017 are suspicious for sclerotic bone metastases. 3. Severe Aortic Atherosclerosis  (ICD10-I70.0). 4. Cholelithiasis. Electronically Signed   By: Genevie Ann M.D.   On: 05/18/2019 17:16   Dg Abd 1 View  Result Date: 05/20/2019 CLINICAL DATA:  Abdominal distension EXAM: ABDOMEN - 1 VIEW COMPARISON:  05/13/2013 abdominal CT FINDINGS: Relatively gasless abdomen with no evidence of obstruction. No concerning mass effect or gas collection. Postoperative changes in the pelvis with bowel sutures. Atherosclerotic calcification. Clear lung bases. IMPRESSION: Nonobstructive bowel gas pattern.  No abnormal stool retention. Electronically Signed   By: Monte Fantasia M.D.   On: 05/21/2019 08:46   Ct Head Wo Contrast  Result Date: 05/22/2019 CLINICAL DATA:  85 year old female with encephalopathy, abdominal pain. History of rectal carcinoma and head and neck cancer. EXAM: CT HEAD WITHOUT CONTRAST TECHNIQUE: Contiguous axial images were obtained from the base of the skull through the vertex without intravenous contrast. COMPARISON:  Head CT 04/01/2019 and earlier. FINDINGS: Brain: Stable cerebral volume. No ventriculomegaly. No midline shift, mass effect, or evidence of intracranial mass lesion. Stable and largely normal for age gray-white matter differentiation throughout the brain. No cortically based acute infarct identified. No acute intracranial hemorrhage identified. Vascular: Extensive Calcified atherosclerosis at the skull base. Skull: Stable.  No acute osseous abnormality identified. Sinuses/Orbits: Visualized paranasal sinuses and mastoids are stable and well pneumatized. Other: Questionable mild left forehead scalp hematoma on series 3, image 6. Underlying left frontal bone intact. Other visible scalp and orbits soft tissues appear negative. IMPRESSION: 1. Questionable mild left forehead scalp hematoma. No underlying skull fracture. 2. Stable noncontrast CT appearance of the brain, with generalized cerebral volume loss but otherwise negative for age. Electronically Signed   By: Genevie Ann M.D.   On:  05/24/2019 16:57   Ct Soft Tissue Neck Wo Contrast  Result Date: 05/03/2019 CLINICAL DATA:  Neck mass with history of malignancy EXAM: CT NECK WITHOUT CONTRAST TECHNIQUE: Multidetector CT imaging of the neck was performed following the standard protocol without intravenous contrast. COMPARISON:  None. FINDINGS: Pharynx and larynx: No evidence of mass or swelling Salivary glands: No inflammation, mass, or stone. Thyroid: Small gland that is otherwise unremarkable Lymph nodes: History of colorectal cancer. No evidence of adenopathy in the neck with close attention to the left supraclavicular fossa. I do not see any palpable markers to correlate with history of neck mass. Vascular: Atherosclerotic calcification. Limited intracranial: Negative Visualized orbits: Bilateral cataract resection Mastoids and visualized paranasal sinuses: Clear Skeleton: Partially covered sclerotic lesion in the T5 vertebra. Elsewhere bones are osteopenic. Upper chest: Small (where visualized) layering pleural effusions. There is airway thickening and mild ground-glass opacity in the right upper lobe. IMPRESSION: 1. No evidence of malignancy in the neck. 2. Sclerotic focus in the T5 vertebra, partially seen but suspicious for metastatic focus. Most recent staging scans are from 2017, is there outside staging available? 3. Small pleural effusions and mild nonspecific airspace opacity in the right upper lobe. Electronically Signed   By: Monte Fantasia M.D.   On: 05/22/2019 11:09   US Liver Doppler  Result Date: 05/05/2019 CLINICAL DATA:  Gastric varices. EXAM: DUPLEX ULTRASOUND OF LIVER TECHNIQUE: Color and duplex Doppler ultrasound was performed to evaluate the hepatic in-flow and out-flow vessels. COMPARISON:  CT abdomen pelvis from yesterday. FINDINGS: Liver: Coarsened heterogeneous parenchymal echogenicity. Nodular contour. No focal  lesion, mass or intrahepatic biliary ductal dilatation. Main Portal Vein size: 0.9 cm Portal Vein  Velocities Main Prox:  45 cm/sec Main Mid: 52 cm/sec Main Dist:  60 cm/sec Right: 23 cm/sec Left: 13 cm/sec Normal hepatopetal flow in the portal veins. Hepatic Vein Velocities Right:  28 cm/sec Middle:  24 cm/sec Left:  28 cm/sec Normal hepatofugal flow in the hepatic veins. IVC: Present and patent with normal respiratory phasicity. Hepatic Artery Velocity:  113 cm/sec Splenic Vein Velocity:  31 cm/sec Spleen: 5.9 cm x 4.0 cm x 6.0 cm with a total volume of 74 cm^3 (411 cm^3 is upper limit normal) Portal Vein Occlusion/Thrombus: No Splenic Vein Occlusion/Thrombus: No Ascites: Present Varices: None IMPRESSION: 1. Cirrhosis with portal hypertension and ascites. 2. Patent hepatic vasculature with appropriately directed flow. Electronically Signed   By: Titus Dubin M.D.   On: 05/05/2019 12:06   Medications: I have reviewed the patient's current medications. Scheduled Meds:  cholecalciferol  1,000 Units Oral Daily   feeding supplement (ENSURE ENLIVE)  237 mL Oral BID BM   heparin  5,000 Units Subcutaneous Q8H   magnesium oxide  800 mg Oral Once   pantoprazole (PROTONIX) IV  40 mg Intravenous Q12H   potassium chloride  40 mEq Oral Once   senna-docusate  1 tablet Oral Daily   cyanocobalamin  1,000 mcg Oral Daily   Continuous Infusions:  dextrose 5 % and 0.9% NaCl 100 mL/hr at 05/05/19 0627   PRN Meds:.acetaminophen **OR** acetaminophen, albuterol, bisacodyl, ondansetron **OR** ondansetron (ZOFRAN) IV, senna-docusate   Assessment: Active Problems:   ARF (acute renal failure) (HCC) Cirrhosis Portal hypertension Dysphasia   Plan: This patient has a distended abdomen with ascites and a increased creatinine making diuretics not an option for this patient.  Her Doppler ultrasound did not show any sign of portal thrombosis.  The patient's EGD also did not show any strictures and an ENT evaluation was recommended.  A abdominal paracentesis may be considered due to the patient's discomfort.   Nothing further to add from a GI point of view.   LOS: 4 days   Emily [name] 05/05/2019, 12:22 PM

## 2019-05-05 NOTE — Progress Notes (Signed)
PT Cancellation Note  Patient Details Name: Emily [name] MRN: [id number] DOB: [birth year]   Cancelled Treatment:    Reason Eval/Treat Not Completed: Fatigue/lethargy limiting ability to participate(Treatment session attempted.  Patient declined, stating she "feels lousy" and isn't "up to it today".  Visibly uncomfortable and unsettled.  Repositioned to comfort and pain medication requested per RN.  Will continue to follow,re-attempt as appropriate.)   [name] H. Owens Shark, PT, DPT, NCS 05/05/19, 3:44 PM [phone]

## 2019-05-05 NOTE — Progress Notes (Signed)
[facility]  ROUNDING NOTE   Subjective:  Creatinine down to 1.8 today. No acute complaints at the moment.   Objective:  Vital signs in last 24 hours:  Temp:  [97.5 F (36.4 C)-97.9 F (36.6 C)] 97.7 F (36.5 C) (07/03 0518) Pulse Rate:  [94-99] 99 (07/03 0518) Resp:  [16] 16 (07/02 1424) BP: (104-140)/(53-76) 140/76 (07/03 0518) SpO2:  [94 %-97 %] 95 % (07/03 0518)  Weight change:  Filed Weights   04/12/2019 1747 04/17/2019 2300  Weight: 52.8 kg 53.4 kg    Intake/Output: I/O last 3 completed shifts: In: 2411.4 [I.V.:2411.4] Out: 625 [Urine:625]   Intake/Output this shift:  Total I/O In: -  Out: 150 [Urine:150]  Physical Exam: General: No acute distress  Head: L eye ecchymosis. Moist oral mucosal membranes  Eyes: Anicteric  Neck: Supple, trachea midline  Lungs:  Clear to auscultation, normal effort  Heart: S1S2 no rubs  Abdomen:  Soft, nontender, bowel sounds present  Extremities: No peripheral edema.  Neurologic: Awake, alert, following commands  Skin: No lesions       Basic Metabolic Panel: Recent Labs  Lab 04/03/2019 1822 05/02/19 0431 05/03/19 0556 05/07/2019 0418 05/05/19 0614  NA 134* 136 140 142 145  K 4.7 4.1 3.7 3.8 3.4*  CL 97* 103 108 113* 119*  CO2 25 21* 18* 17* 21*  GLUCOSE 94 79 93 86 195*  BUN 74* 76* 70* 62* 50*  CREATININE 3.59* 3.48* 2.67* 2.08* 1.80*  CALCIUM 8.7* 8.0* 8.3* 8.2* 8.1*  MG  --   --  1.8 1.7 1.6*  PHOS  --   --  3.0  --   --     Liver Function Tests: Recent Labs  Lab 04/30/2019 1822  AST 28  ALT 16  ALKPHOS 607*  BILITOT 0.6  PROT 5.9*  ALBUMIN 2.7*   No results for input(s): LIPASE, AMYLASE in the last 168 hours. No results for input(s): AMMONIA in the last 168 hours.  CBC: Recent Labs  Lab 04/23/2019 1822 05/02/19 0431 05/03/19 0556 05/29/2019 2138 05/05/19 0614  WBC 10.4 8.2 6.9  --  5.6  HGB 14.1 11.9* 12.0 10.7* 10.0*  HCT 42.5 36.8 37.8 32.7* 30.9*  MCV 88.9 89.3 91.7  --  90.1  PLT  152 134* 151  --  87*    Cardiac Enzymes: No results for input(s): CKTOTAL, CKMB, CKMBINDEX, TROPONINI in the last 168 hours.  BNP: Invalid input(s): POCBNP  CBG: No results for input(s): GLUCAP in the last 168 hours.  Microbiology: Results for orders placed or performed during the hospital encounter of 04/29/2019  SARS Coronavirus 2 (CEPHEID - Performed in [place] hospital lab), Hosp Order     Status: None   Collection Time: 04/03/2019  8:37 PM   Specimen: Nasopharyngeal Swab  Result Value Ref Range Status   SARS Coronavirus 2 NEGATIVE NEGATIVE Final    Comment: (NOTE) If result is NEGATIVE SARS-CoV-2 target nucleic acids are NOT DETECTED. The SARS-CoV-2 RNA is generally detectable in upper and lower  respiratory specimens during the acute phase of infection. The lowest  concentration of SARS-CoV-2 viral copies this assay can detect is 250  copies / mL. A negative result does not preclude SARS-CoV-2 infection  and should not be used as the sole basis for treatment or other  patient management decisions.  A negative result may occur with  improper specimen collection / handling, submission of specimen other  than nasopharyngeal swab, presence of viral mutation(s) within the  areas targeted by this assay, and inadequate number of viral copies  (<250 copies / mL). A negative result must be combined with clinical  observations, patient history, and epidemiological information. If result is POSITIVE SARS-CoV-2 target nucleic acids are DETECTED. The SARS-CoV-2 RNA is generally detectable in upper and lower  respiratory specimens dur ing the acute phase of infection.  Positive  results are indicative of active infection with SARS-CoV-2.  Clinical  correlation with patient history and other diagnostic information is  necessary to determine patient infection status.  Positive results do  not rule out bacterial infection or co-infection with other viruses. If result is PRESUMPTIVE  POSTIVE SARS-CoV-2 nucleic acids MAY BE PRESENT.   A presumptive positive result was obtained on the submitted specimen  and confirmed on repeat testing.  While 28-Dec-2017 novel coronavirus  (SARS-CoV-2) nucleic acids may be present in the submitted sample  additional confirmatory testing may be necessary for epidemiological  and / or clinical management purposes  to differentiate between  SARS-CoV-2 and other Sarbecovirus currently known to infect humans.  If clinically indicated additional testing with an alternate test  methodology [phone]) is advised. The SARS-CoV-2 RNA is generally  detectable in upper and lower respiratory sp ecimens during the acute  phase of infection. The expected result is Negative. Fact Sheet for Patients:  StrictlyIdeas.no Fact Sheet for Healthcare Providers: BankingDealers.co.za This test is not yet approved or cleared by the [place] FDA and has been authorized for detection and/or diagnosis of SARS-CoV-2 by FDA under an Emergency Use Authorization (EUA).  This EUA will remain in effect (meaning this test can be used) for the duration of the COVID-19 declaration under Section 564(b)(1) of the Act, 21 U.S.C. section 360bbb-3(b)(1), unless the authorization is terminated or revoked sooner. Performed at [facility], [address] [place], [place] [address]   MRSA PCR Screening     Status: None   Collection Time: 05/02/19  5:10 PM   Specimen: Nasal Mucosa; Nasopharyngeal  Result Value Ref Range Status   MRSA by PCR NEGATIVE NEGATIVE Final    Comment:        The GeneXpert MRSA Assay (FDA approved for NASAL specimens only), is one component of a comprehensive MRSA colonization surveillance program. It is not intended to diagnose MRSA infection nor to guide or monitor treatment for MRSA infections. Performed at [facility], [address] [place], [place] [address]      Coagulation Studies: Recent Labs    05/05/2019 December 28, 2136  LABPROT 21.4*  INR 1.9*    Urinalysis: No results for input(s): COLORURINE, LABSPEC, PHURINE, GLUCOSEU, HGBUR, BILIRUBINUR, KETONESUR, PROTEINUR, UROBILINOGEN, NITRITE, LEUKOCYTESUR in the last 72 hours.  Invalid input(s): APPERANCEUR    Imaging: Ct Abdomen Pelvis Wo Contrast  Result Date: 05/26/2019 CLINICAL DATA:  85 year old female with abdominal pain and cephalopathy. History of rectal and head and neck cancer. EXAM: CT ABDOMEN AND PELVIS WITHOUT CONTRAST TECHNIQUE: Multidetector CT imaging of the abdomen and pelvis was performed following the standard protocol without IV contrast. COMPARISON:  PET-CT 05/14/2016 and earlier. FINDINGS: Lower chest: Small bilateral layering pleural effusions are new since 12-29-15. No pericardial effusion. Minor lung base compressive atelectasis. Hepatobiliary: Large volume ascites with simple fluid density. Small cirrhotic appearing liver which has changed in morphology since 2015-12-29. No discrete liver lesion is evident in the absence of contrast. There is a 10 millimeter gallstone identified on series 2, image 27. There is decreased gallbladder detail due to ascites. Pancreas: Mild chronic calcific pancreatitis  is stable. Spleen: Spleen size remains normal. Adrenals/Urinary Tract: Negative adrenal glands. There are bilateral renal vascular calcifications. Noncontrast kidneys appears stable since 2017. There are chronic left gonadal vein phleboliths. The ureters are difficult to delineate. There is a urethral catheter in the bladder which is poorly delineated due to a combination of ascites and streak artifact from right hip arthroplasty. Stomach/Bowel: No dilated large or small bowel loops. The stomach is decompressed. Individual bowel loops are difficult to delineate due to the large volume ascites. No pneumoperitoneum. Vascular/Lymphatic: Severe Aortoiliac calcified atherosclerosis. Vascular patency is not  evaluated in the absence of IV contrast. No lymphadenopathy is evident. Reproductive: Surgically absent. Other: Moderate to large pelvic ascites. Musculoskeletal: There is a new small round sclerotic lesion measuring 4-5 millimeters in the left pedicle of T11 on series 2, image 8. Underlying chronically heterogeneous bone mineralization, but multiple other areas of small new sclerotic foci are noted in the lumbar spine. Right hip arthroplasty is new since 2017. No destructive osseous lesion identified. IMPRESSION: 1. Cirrhotic liver with Large Volume Ascites. Small bilateral layering pleural effusions. No discrete liver lesion in the absence of IV contrast. Spleen size remains normal. 2. Multiple small new sclerotic foci in the spine since 2017 are suspicious for sclerotic bone metastases. 3. Severe Aortic Atherosclerosis (ICD10-I70.0). 4. Cholelithiasis. Electronically Signed   By: H  Hall M.D.   On: 05/06/2019 17:16   Dg Abd 1 View  Result Date: 05/30/2019 CLINICAL DATA:  Abdominal distension EXAM: ABDOMEN - 1 VIEW COMPARISON:  05/13/2013 abdominal CT FINDINGS: Relatively gasless abdomen with no evidence of obstruction. No concerning mass effect or gas collection. Postoperative changes in the pelvis with bowel sutures. Atherosclerotic calcification. Clear lung bases. IMPRESSION: Nonobstructive bowel gas pattern.  No abnormal stool retention. Electronically Signed   By: Jonathon  Watts M.D.   On: 05/14/2019 08:46   Ct Head Wo Contrast  Result Date: 05/23/2019 CLINICAL DATA:  85-year-old female with encephalopathy, abdominal pain. History of rectal carcinoma and head and neck cancer. EXAM: CT HEAD WITHOUT CONTRAST TECHNIQUE: Contiguous axial images were obtained from the base of the skull through the vertex without intravenous contrast. COMPARISON:  Head CT 04/01/2019 and earlier. FINDINGS: Brain: Stable cerebral volume. No ventriculomegaly. No midline shift, mass effect, or evidence of intracranial mass  lesion. Stable and largely normal for age gray-white matter differentiation throughout the brain. No cortically based acute infarct identified. No acute intracranial hemorrhage identified. Vascular: Extensive Calcified atherosclerosis at the skull base. Skull: Stable.  No acute osseous abnormality identified. Sinuses/Orbits: Visualized paranasal sinuses and mastoids are stable and well pneumatized. Other: Questionable mild left forehead scalp hematoma on series 3, image 6. Underlying left frontal bone intact. Other visible scalp and orbits soft tissues appear negative. IMPRESSION: 1. Questionable mild left forehead scalp hematoma. No underlying skull fracture. 2. Stable noncontrast CT appearance of the brain, with generalized cerebral volume loss but otherwise negative for age. Electronically Signed   By: H  Hall M.D.   On: 05/18/2019 16:57   Ct Soft Tissue Neck Wo Contrast  Result Date: 05/08/2019 CLINICAL DATA:  Neck mass with history of malignancy EXAM: CT NECK WITHOUT CONTRAST TECHNIQUE: Multidetector CT imaging of the neck was performed following the standard protocol without intravenous contrast. COMPARISON:  None. FINDINGS: Pharynx and larynx: No evidence of mass or swelling Salivary glands: No inflammation, mass, or stone. Thyroid: Small gland that is otherwise unremarkable Lymph nodes: History of colorectal cancer. No evidence of adenopathy in the neck with close attention to   the left supraclavicular fossa. I do not see any palpable markers to correlate with history of neck mass. Vascular: Atherosclerotic calcification. Limited intracranial: Negative Visualized orbits: Bilateral cataract resection Mastoids and visualized paranasal sinuses: Clear Skeleton: Partially covered sclerotic lesion in the T5 vertebra. Elsewhere bones are osteopenic. Upper chest: Small (where visualized) layering pleural effusions. There is airway thickening and mild ground-glass opacity in the right upper lobe. IMPRESSION: 1. No  evidence of malignancy in the neck. 2. Sclerotic focus in the T5 vertebra, partially seen but suspicious for metastatic focus. Most recent staging scans are from 2017, is there outside staging available? 3. Small pleural effusions and mild nonspecific airspace opacity in the right upper lobe. Electronically Signed   By: Jonathon  Watts M.D.   On: 05/09/2019 11:09   Us Liver Doppler  Result Date: 05/05/2019 CLINICAL DATA:  Gastric varices. EXAM: DUPLEX ULTRASOUND OF LIVER TECHNIQUE: Color and duplex Doppler ultrasound was performed to evaluate the hepatic in-flow and out-flow vessels. COMPARISON:  CT abdomen pelvis from yesterday. FINDINGS: Liver: Coarsened heterogeneous parenchymal echogenicity. Nodular contour. No focal lesion, mass or intrahepatic biliary ductal dilatation. Main Portal Vein size: 0.9 cm Portal Vein Velocities Main Prox:  45 cm/sec Main Mid: 52 cm/sec Main Dist:  60 cm/sec Right: 23 cm/sec Left: 13 cm/sec Normal hepatopetal flow in the portal veins. Hepatic Vein Velocities Right:  28 cm/sec Middle:  24 cm/sec Left:  28 cm/sec Normal hepatofugal flow in the hepatic veins. IVC: Present and patent with normal respiratory phasicity. Hepatic Artery Velocity:  113 cm/sec Splenic Vein Velocity:  31 cm/sec Spleen: 5.9 cm x 4.0 cm x 6.0 cm with a total volume of 74 cm^3 (411 cm^3 is upper limit normal) Portal Vein Occlusion/Thrombus: No Splenic Vein Occlusion/Thrombus: No Ascites: Present Varices: None IMPRESSION: 1. Cirrhosis with portal hypertension and ascites. 2. Patent hepatic vasculature with appropriately directed flow. Electronically Signed   By: William T Derry M.D.   On: 05/05/2019 12:06     Medications:   . dextrose 5 % and 0.9% NaCl 100 mL/hr at 05/05/19 0627   . cholecalciferol  1,000 Units Oral Daily  . feeding supplement (ENSURE ENLIVE)  237 mL Oral BID BM  . magnesium oxide  800 mg Oral Once  . pantoprazole (PROTONIX) IV  40 mg Intravenous Q12H  . potassium chloride  40 mEq  Oral Once  . senna-docusate  1 tablet Oral Daily  . cyanocobalamin  1,000 mcg Oral Daily   acetaminophen **OR** acetaminophen, albuterol, bisacodyl, ondansetron **OR** ondansetron (ZOFRAN) IV, senna-docusate  Assessment/ Plan:  85 y.o. female with a PMHx of colorectal cancer, diverticulosis, hemorrhoids, hypertension, osteopenia, osteoarthritis, who was admitted to [facility] on 04/14/2019 for evaluation of acute renal failure.   1.  Acute renal failure/chronic kidney disease stage III baseline creatinine 1.0 with an EGFR 52/Proteinuria.  Suspect that patient developed volume depletion.  She was on spironolactone prior to admission.  -Serologic work-up negative.  Patient found a negative ANA ANCA antibodies GBM antibodies and normal complement levels. -Continue IV fluid hydration for now.  Creatinine down to 1.8.  2.  Hypoalbuminemia.  Continue protein supplementation.   LOS: 4   7/3/20201:58 PM   

## 2019-05-06 DIAGNOSIS — R4702 Dysphasia: Secondary | ICD-10-CM

## 2019-05-06 LAB — CBC
HCT: 32.9 % — ABNORMAL LOW (ref 36.0–46.0)
Hemoglobin: 10.6 g/dL — ABNORMAL LOW (ref 12.0–15.0)
MCH: 29.1 pg (ref 26.0–34.0)
MCHC: 32.2 g/dL (ref 30.0–36.0)
MCV: 90.4 fL (ref 80.0–100.0)
Platelets: 70 10*3/uL — ABNORMAL LOW (ref 150–400)
RBC: 3.64 MIL/uL — ABNORMAL LOW (ref 3.87–5.11)
RDW: 15.8 % — ABNORMAL HIGH (ref 11.5–15.5)
WBC: 7.5 10*3/uL (ref 4.0–10.5)
nRBC: 0 % (ref 0.0–0.2)

## 2019-05-06 LAB — BASIC METABOLIC PANEL
Anion gap: 8 (ref 5–15)
BUN: 47 mg/dL — ABNORMAL HIGH (ref 8–23)
CO2: 20 mmol/L — ABNORMAL LOW (ref 22–32)
Calcium: 8.1 mg/dL — ABNORMAL LOW (ref 8.9–10.3)
Chloride: 118 mmol/L — ABNORMAL HIGH (ref 98–111)
Creatinine, Ser: 1.46 mg/dL — ABNORMAL HIGH (ref 0.44–1.00)
GFR calc Af Amer: 38 mL/min — ABNORMAL LOW (ref >60)
GFR calc non Af Amer: 33 mL/min — ABNORMAL LOW (ref >60)
Glucose, Bld: 132 mg/dL — ABNORMAL HIGH (ref 70–99)
Potassium: 3.6 mmol/L (ref 3.5–5.1)
Sodium: 146 mmol/L — ABNORMAL HIGH (ref 135–145)

## 2019-05-06 LAB — GLUCOSE, CAPILLARY: Glucose-Capillary: 129 mg/dL — ABNORMAL HIGH (ref 70–99)

## 2019-05-06 LAB — MAGNESIUM: Magnesium: 1.4 mg/dL — ABNORMAL LOW (ref 1.7–2.4)

## 2019-05-06 MED ORDER — MAGNESIUM SULFATE 2 GM/50ML IV SOLN
2.0000 g | Freq: Once | INTRAVENOUS | Status: AC
  Administered 2019-05-06: 2 g via INTRAVENOUS
  Filled 2019-05-06: qty 50

## 2019-05-06 MED ORDER — MORPHINE SULFATE (PF) 2 MG/ML IV SOLN
2.0000 mg | INTRAVENOUS | Status: DC | PRN
  Administered 2019-05-06 – 2019-05-08 (×5): 2 mg via INTRAVENOUS
  Filled 2019-05-06 (×5): qty 1

## 2019-05-06 NOTE — Progress Notes (Signed)
Emily Lame, MD [facility]   [address] [place], [place] [address] Phone: [phone] Fax : [phone]   Subjective: We have been seeing this patient for a consult for dysphasia.  The patient has had an EGD without any sign of the cause of dysphasia.  The patient was found to have gastric varices and had Doppler showing normal flow of the portal vessels.  The patient also was found to have ascites and cirrhosis.  The patient's main complaint at this time is ascites causing her abdominal distention and pain.   Objective: Vital signs in last 24 hours: Vitals:   05/05/19 0518 05/05/19 1611 05/05/19 2048 05/06/19 0429  BP: 140/76 132/63 137/79 (!) 126/59  Pulse: 99 93 84 95  Resp:  20  18  Temp: 97.7 F (36.5 C) 98.6 F (37 C) (!) 97.5 F (36.4 C) 97.9 F (36.6 C)  TempSrc: Oral     SpO2: 95% 99% 97% 100%  Weight:      Height:       Weight change:   Intake/Output Summary (Last 24 hours) at 05/06/2019 1112 Last data filed at 05/06/2019 0500 Gross per 24 hour  Intake 0 ml  Output 400 ml  Net -400 ml     Exam: Heart:: Regular rate and rhythm, S1-S2 without murmurs rubs or gallops Lungs: normal and clear to auscultation and percussion Abdomen: Distended with shifting dullness and fluid wave consistent with ascites   Lab Results: @LABTEST2 @ Micro Results: Recent Results (from the past 240 hour(s))  SARS Coronavirus 2 (CEPHEID - Performed in [place] hospital lab), Hosp Order     Status: None   Collection Time: 04/29/2019  8:37 PM   Specimen: Nasopharyngeal Swab  Result Value Ref Range Status   SARS Coronavirus 2 NEGATIVE NEGATIVE Final    Comment: (NOTE) If result is NEGATIVE SARS-CoV-2 target nucleic acids are NOT DETECTED. The SARS-CoV-2 RNA is generally detectable in upper and lower  respiratory specimens during the acute phase of infection. The lowest  concentration of SARS-CoV-2 viral copies this assay can detect is 250  copies / mL. A negative result  does not preclude SARS-CoV-2 infection  and should not be used as the sole basis for treatment or other  patient management decisions.  A negative result may occur with  improper specimen collection / handling, submission of specimen other  than nasopharyngeal swab, presence of viral mutation(s) within the  areas targeted by this assay, and inadequate number of viral copies  (<250 copies / mL). A negative result must be combined with clinical  observations, patient history, and epidemiological information. If result is POSITIVE SARS-CoV-2 target nucleic acids are DETECTED. The SARS-CoV-2 RNA is generally detectable in upper and lower  respiratory specimens dur ing the acute phase of infection.  Positive  results are indicative of active infection with SARS-CoV-2.  Clinical  correlation with patient history and other diagnostic information is  necessary to determine patient infection status.  Positive results do  not rule out bacterial infection or co-infection with other viruses. If result is PRESUMPTIVE POSTIVE SARS-CoV-2 nucleic acids MAY BE PRESENT.   A presumptive positive result was obtained on the submitted specimen  and confirmed on repeat testing.  While 2019 novel coronavirus  (SARS-CoV-2) nucleic acids may be present in the submitted sample  additional confirmatory testing may be necessary for epidemiological  and / or clinical management purposes  to differentiate between  SARS-CoV-2 and other Sarbecovirus currently known to infect humans.  If clinically  indicated additional testing with an alternate test  methodology [phone]) is advised. The SARS-CoV-2 RNA is generally  detectable in upper and lower respiratory sp ecimens during the acute  phase of infection. The expected result is Negative. Fact Sheet for Patients:  StrictlyIdeas.no Fact Sheet for Healthcare Providers: BankingDealers.co.za This test is not yet approved or  cleared by the [place] FDA and has been authorized for detection and/or diagnosis of SARS-CoV-2 by FDA under an Emergency Use Authorization (EUA).  This EUA will remain in effect (meaning this test can be used) for the duration of the COVID-19 declaration under Section 564(b)(1) of the Act, 21 U.S.C. section 360bbb-3(b)(1), unless the authorization is terminated or revoked sooner. Performed at [facility], [address] [place], [place] [address]   MRSA PCR Screening     Status: None   Collection Time: 05/02/19  5:10 PM   Specimen: Nasal Mucosa; Nasopharyngeal  Result Value Ref Range Status   MRSA by PCR NEGATIVE NEGATIVE Final    Comment:        The GeneXpert MRSA Assay (FDA approved for NASAL specimens only), is one component of a comprehensive MRSA colonization surveillance program. It is not intended to diagnose MRSA infection nor to guide or monitor treatment for MRSA infections. Performed at [facility], [place], [place] [address]    Studies/Results: Ct Abdomen Pelvis Wo Contrast  Result Date: 06/02/2019 CLINICAL DATA:  85 year old female with abdominal pain and cephalopathy. History of rectal and head and neck cancer. EXAM: CT ABDOMEN AND PELVIS WITHOUT CONTRAST TECHNIQUE: Multidetector CT imaging of the abdomen and pelvis was performed following the standard protocol without IV contrast. COMPARISON:  PET-CT 05/14/2016 and earlier. FINDINGS: Lower chest: Small bilateral layering pleural effusions are new since 2017. No pericardial effusion. Minor lung base compressive atelectasis. Hepatobiliary: Large volume ascites with simple fluid density. Small cirrhotic appearing liver which has changed in morphology since 2017. No discrete liver lesion is evident in the absence of contrast. There is a 10 millimeter gallstone identified on series 2, image 27. There is decreased gallbladder detail due to ascites. Pancreas: Mild chronic calcific  pancreatitis is stable. Spleen: Spleen size remains normal. Adrenals/Urinary Tract: Negative adrenal glands. There are bilateral renal vascular calcifications. Noncontrast kidneys appears stable since 2017. There are chronic left gonadal vein phleboliths. The ureters are difficult to delineate. There is a urethral catheter in the bladder which is poorly delineated due to a combination of ascites and streak artifact from right hip arthroplasty. Stomach/Bowel: No dilated large or small bowel loops. The stomach is decompressed. Individual bowel loops are difficult to delineate due to the large volume ascites. No pneumoperitoneum. Vascular/Lymphatic: Severe Aortoiliac calcified atherosclerosis. Vascular patency is not evaluated in the absence of IV contrast. No lymphadenopathy is evident. Reproductive: Surgically absent. Other: Moderate to large pelvic ascites. Musculoskeletal: There is a new small round sclerotic lesion measuring 4-5 millimeters in the left pedicle of T11 on series 2, image 8. Underlying chronically heterogeneous bone mineralization, but multiple other areas of small new sclerotic foci are noted in the lumbar spine. Right hip arthroplasty is new since 2017. No destructive osseous lesion identified. IMPRESSION: 1. Cirrhotic liver with Large Volume Ascites. Small bilateral layering pleural effusions. No discrete liver lesion in the absence of IV contrast. Spleen size remains normal. 2. Multiple small new sclerotic foci in the spine since 2017 are suspicious for sclerotic bone metastases. 3. Severe Aortic Atherosclerosis (ICD10-I70.0). 4. Cholelithiasis. Electronically Signed   By: Lemmie Evens  [place] Crane M.D.   On: 05/19/2019 17:16   Ct Head Wo Contrast  Result Date: 05/16/2019 CLINICAL DATA:  85 year old female with encephalopathy, abdominal pain. History of rectal carcinoma and head and neck cancer. EXAM: CT HEAD WITHOUT CONTRAST TECHNIQUE: Contiguous axial images were obtained from the base of the skull through  the vertex without intravenous contrast. COMPARISON:  Head CT 04/01/2019 and earlier. FINDINGS: Brain: Stable cerebral volume. No ventriculomegaly. No midline shift, mass effect, or evidence of intracranial mass lesion. Stable and largely normal for age gray-white matter differentiation throughout the brain. No cortically based acute infarct identified. No acute intracranial hemorrhage identified. Vascular: Extensive Calcified atherosclerosis at the skull base. Skull: Stable.  No acute osseous abnormality identified. Sinuses/Orbits: Visualized paranasal sinuses and mastoids are stable and well pneumatized. Other: Questionable mild left forehead scalp hematoma on series 3, image 6. Underlying left frontal bone intact. Other visible scalp and orbits soft tissues appear negative. IMPRESSION: 1. Questionable mild left forehead scalp hematoma. No underlying skull fracture. 2. Stable noncontrast CT appearance of the brain, with generalized cerebral volume loss but otherwise negative for age. Electronically Signed   By: Genevie Ann M.D.   On: 05/15/2019 16:57   US Liver Doppler  Result Date: 05/05/2019 CLINICAL DATA:  Gastric varices. EXAM: DUPLEX ULTRASOUND OF LIVER TECHNIQUE: Color and duplex Doppler ultrasound was performed to evaluate the hepatic in-flow and out-flow vessels. COMPARISON:  CT abdomen pelvis from yesterday. FINDINGS: Liver: Coarsened heterogeneous parenchymal echogenicity. Nodular contour. No focal lesion, mass or intrahepatic biliary ductal dilatation. Main Portal Vein size: 0.9 cm Portal Vein Velocities Main Prox:  45 cm/sec Main Mid: 52 cm/sec Main Dist:  60 cm/sec Right: 23 cm/sec Left: 13 cm/sec Normal hepatopetal flow in the portal veins. Hepatic Vein Velocities Right:  28 cm/sec Middle:  24 cm/sec Left:  28 cm/sec Normal hepatofugal flow in the hepatic veins. IVC: Present and patent with normal respiratory phasicity. Hepatic Artery Velocity:  113 cm/sec Splenic Vein Velocity:  31 cm/sec Spleen: 5.9  cm x 4.0 cm x 6.0 cm with a total volume of 74 cm^3 (411 cm^3 is upper limit normal) Portal Vein Occlusion/Thrombus: No Splenic Vein Occlusion/Thrombus: No Ascites: Present Varices: None IMPRESSION: 1. Cirrhosis with portal hypertension and ascites. 2. Patent hepatic vasculature with appropriately directed flow. Electronically Signed   By: Titus Dubin M.D.   On: 05/05/2019 12:06   Medications: I have reviewed the patient's current medications. Scheduled Meds: . cholecalciferol  1,000 Units Oral Daily  . feeding supplement (ENSURE ENLIVE)  237 mL Oral BID BM  . magnesium oxide  800 mg Oral Once  . pantoprazole (PROTONIX) IV  40 mg Intravenous Q12H  . potassium chloride  40 mEq Oral Once  . senna-docusate  1 tablet Oral Daily  . cyanocobalamin  1,000 mcg Oral Daily   Continuous Infusions: . dextrose 5 % and 0.9% NaCl 50 mL/hr at 05/05/19 1542   PRN Meds:.acetaminophen **OR** acetaminophen, albuterol, bisacodyl, ondansetron **OR** ondansetron (ZOFRAN) IV, senna-docusate   Assessment: Active Problems:   ARF (acute renal failure) (HCC)   Abdominal pain   Gastric varices without bleeding    Plan: This patient has portal hypertension with cirrhosis ascites and dysphasia.  The patient was seen for dysphasia and had an EGD that showed no esophageal stricture or cause for the dysphasia.  The patient was thought to have a difficult entry of the scope into the esophagus and an ENT evaluation was recommended.  There is nothing further to add for this patient  at this time from a GI point of view.  The patient cannot go on any diuretics due to her kidney function.  If her ascites continues to become bothersome she may need to undergo a TIPS procedure through interventional radiology.   I will sign off.  Please call if any further GI concerns or questions.  We would like to thank you for the opportunity to participate in the care of Emily [name].     LOS: 5 days   Treana Lacour 05/06/2019,  11:12 AM

## 2019-05-06 NOTE — Progress Notes (Signed)
[place] [facility] [place] NAME: Emily [name]    MR#:  [id number]  DATE OF BIRTH:  07-25-1933  SUBJECTIVE:  CHIEF COMPLAINT:   Chief Complaint  Patient presents with  . Abnormal Lab   No new complaint this morning.  No fevers.  No abdominal pains apart from mild abdominal discomfort..  Patient tolerating current diet.   REVIEW OF SYSTEMS:  ROS unobtainable due to patient's mental status  DRUG ALLERGIES:   Allergies  Allergen Reactions  . Amlodipine Besylate Other (See Comments)    Patient unaware of any allergy with this medicine  . Oxycodone     confusion   VITALS:  Blood pressure (!) 126/59, pulse 95, temperature 97.9 F (36.6 C), resp. rate 18, height 5\' 4"  (1.626 m), weight 53.4 kg, SpO2 100 %. PHYSICAL EXAMINATION:  Physical Exam   GENERAL:  85 y.o.-year-old patient lying in the bed with no acute distress.  EYES: Pupils equal, round, reactive to light and accommodation. No scleral icterus. Extraocular muscles intact.  Left frontal scalp hematoma; present on admission. HEENT: Head atraumatic, normocephalic. Oropharynx and nasopharynx clear.  NECK:  Supple, no jugular venous distention. No thyroid enlargement, no tenderness.  LUNGS: Normal breath sounds bilaterally, no wheezing, rales,rhonchi or crepitation. No use of accessory muscles of respiration.  CARDIOVASCULAR: S1, S2 normal. No murmurs, rubs, or gallops.  ABDOMEN: Soft, nontender, mildly distended . Bowel sounds present. No organomegaly or mass.  EXTREMITIES: No pedal edema, cyanosis, or clubbing.  NEUROLOGIC: Unable to exam.  Patient pleasantly confused. PSYCHIATRIC: The patient is demented.  LABORATORY PANEL:  Female CBC Recent Labs  Lab 05/06/19 0539  WBC 7.5  HGB 10.6*  HCT 32.9*  PLT 70*   ------------------------------------------------------------------------------------------------------------------ Chemistries  Recent Labs  Lab 04/27/2019 1822   05/06/19 0539  NA 134*   < > 146*  K 4.7   < > 3.6  CL 97*   < > 118*  CO2 25   < > 20*  GLUCOSE 94   < > 132*  BUN 74*   < > 47*  CREATININE 3.59*   < > 1.46*  CALCIUM 8.7*   < > 8.1*  MG  --    < > 1.4*  AST 28  --   --   ALT 16  --   --   ALKPHOS 607*  --   --   BILITOT 0.6  --   --    < > = values in this interval not displayed.   RADIOLOGY:  No results found. ASSESSMENT AND PLAN:   1. Acute kidney injury  Felt to be secondary to dehydration.   Being hydrated with IV fluids with continued improvement in renal function.  Renal ultrasound with evidence of medical renal disease.  Minimal collecting system dilatation bilaterally.Lisinopril placed on hold.  Decreased rate of IV fluids. Appreciate nephrology input  2. Hyponatremia.   Likely due to dehydration.  Resolved with IV fluid hydration Hypomagnesemia.  Replaced.  Follow-up on repeat levels in a.m.  3. Hypertension.  Hold lisinopril and Lopressor due to low side blood pressure.  Consider resuming Lopressor if blood pressure begins to trend up  4. Dementia.  Aspiration and fall precaution Stable.  Patient pleasantly confused. Patient noted to be currently being followed by outpatient palliative at [facility].  5.  Dysphagia Nursing staff reported coughing while swallowing. *Chest x-ray showed chronic interstitial lung markings.  Increasing patchy density in the left base which could  be atelectasis or mild pneumonia.  Clinically no fevers.  No leukocytosis.  No clinical evidence to support pneumonia. Seen by speech therapist and recommended GI evaluation. Discussed with patient's son  who confirmed patient has had prior history of neck cancer status post radiation treatment.  Son reported she had follow-up laryngoscopy couple of months ago which was unremarkable. CT neck without contrast ordered to rule out recurrence of neck cancer. Placed on PPI IV. Patient had EGD done which revealed reflux esophagitis.  There was  mild resistance while passing the scope past the vocal cords in the upper esophagus.  No stricture or narrowing.  6.  Acute urinary retention Patient with lower abdominal pain. Patient seen by urologist.  Appreciate input.  Recommendation is to maintain Foley catheter which was placed.  To consider belladonna suppository for bladder spasms.  CT scan reviewed massive ascites and bladder.  Decompressed.  No plans for any further urological procedure at this time.  7.  Liver cirrhosis with massive ascites Patient very comfortable this morning with no abdominal pains.  Abdomen soft. Discussed with son about new diagnosis of liver cirrhosis.  Evidence of portal hypertension on liver ultrasound. Discussed doing paracentesis via ultrasound guidance with son.  Since patient appears comfortable and is high risk of reaccumulation of ascites, son wishes to hold off on paracentesis for now.  To consider paracentesis if patient becomes symptomatic from the ascites with abdominal pains  8.  New findings of multiple sclerotic foci in the lumbar spine. Discussed findings with son as well. Prior history of neck cancer status post treatment. Son does not wish to adjust any further since patient cannot withstand any surgery or chemotherapy at this time.  Focus to be primarily on keeping patient comfortable.  Palliative care already consulted. Case manager working on discharge with hospice to skilled nursing facility after the weekend.  9.  Thrombocytopenia Likely due to underlying liver cirrhosis.  No bleeding.  Follow-up on CBC in a.m.  DVT prophylaxis; heparin discontinued due to thrombocytopenia SCDs  All the records are reviewed and case discussed with Care Management/Social Worker. Called and discussed with patient's son; Mr. [name] over the phone was monitored and updated him on treatment plans.  All questions answered.  He agrees with plan of care as outlined above  CODE STATUS: DNR  TOTAL TIME TAKING  CARE OF THIS PATIENT: 39 minutes.   More than 50% of the time was spent in counseling/coordination of care: YES  POSSIBLE D/C IN 2 DAYS, DEPENDING ON CLINICAL CONDITION.   Letti Towell M.D on 05/06/2019 at 9:36 AM  Between 7am to 6pm - Pager - [phone]  After 6pm go to [email or web address] - Proofreader  [facility]  Office  [phone]  CC: Primary care physician; [name], MD  Note: This dictation was prepared with Dragon dictation along with smaller phrase technology. Any transcriptional errors that result from this process are unintentional.

## 2019-05-06 NOTE — Progress Notes (Signed)
[facility]  ROUNDING NOTE   Subjective:  Patient seen at bedside. Creatinine down to 1.5. Resting comfortably at the moment.   Objective:  Vital signs in last 24 hours:  Temp:  [97.5 F (36.4 C)-98.6 F (37 C)] 98.4 F (36.9 C) (07/04 1234) Pulse Rate:  [66-95] 66 (07/04 1234) Resp:  [18-20] 18 (07/04 1234) BP: (126-137)/(59-79) 128/66 (07/04 1234) SpO2:  [97 %-100 %] 98 % (07/04 1234)  Weight change:  Filed Weights   04/16/2019 1747 04/17/2019 2300  Weight: 52.8 kg 53.4 kg    Intake/Output: I/O last 3 completed shifts: In: 479.4 [I.V.:479.4] Out: 650 [Urine:650]   Intake/Output this shift:  No intake/output data recorded.  Physical Exam: General: No acute distress  Head: L eye ecchymosis. Moist oral mucosal membranes  Eyes: Anicteric  Neck: Supple, trachea midline  Lungs:  Clear to auscultation, normal effort  Heart: S1S2 no rubs  Abdomen:  Soft, nontender, bowel sounds present  Extremities: No peripheral edema.  Neurologic: Awake, alert, following commands  Skin: No lesions       Basic Metabolic Panel: Recent Labs  Lab 05/02/19 0431 05/03/19 0556 05/26/2019 0418 05/05/19 0614 05/06/19 0539  NA 136 140 142 145 146*  K 4.1 3.7 3.8 3.4* 3.6  CL 103 108 113* 119* 118*  CO2 21* 18* 17* 21* 20*  GLUCOSE 79 93 86 195* 132*  BUN 76* 70* 62* 50* 47*  CREATININE 3.48* 2.67* 2.08* 1.80* 1.46*  CALCIUM 8.0* 8.3* 8.2* 8.1* 8.1*  MG  --  1.8 1.7 1.6* 1.4*  PHOS  --  3.0  --   --   --     Liver Function Tests: Recent Labs  Lab 04/15/2019 1822  AST 28  ALT 16  ALKPHOS 607*  BILITOT 0.6  PROT 5.9*  ALBUMIN 2.7*   No results for input(s): LIPASE, AMYLASE in the last 168 hours. No results for input(s): AMMONIA in the last 168 hours.  CBC: Recent Labs  Lab 04/28/2019 1822 05/02/19 0431 05/03/19 0556 05/23/2019 2138 05/05/19 0614 05/06/19 0539  WBC 10.4 8.2 6.9  --  5.6 7.5  HGB 14.1 11.9* 12.0 10.7* 10.0* 10.6*  HCT 42.5 36.8 37.8 32.7*  30.9* 32.9*  MCV 88.9 89.3 91.7  --  90.1 90.4  PLT 152 134* 151  --  87* 70*    Cardiac Enzymes: No results for input(s): CKTOTAL, CKMB, CKMBINDEX, TROPONINI in the last 168 hours.  BNP: Invalid input(s): POCBNP  CBG: No results for input(s): GLUCAP in the last 168 hours.  Microbiology: Results for orders placed or performed during the hospital encounter of 04/21/2019  SARS Coronavirus 2 (CEPHEID - Performed in [place] hospital lab), Hosp Order     Status: None   Collection Time: 04/26/2019  8:37 PM   Specimen: Nasopharyngeal Swab  Result Value Ref Range Status   SARS Coronavirus 2 NEGATIVE NEGATIVE Final    Comment: (NOTE) If result is NEGATIVE SARS-CoV-2 target nucleic acids are NOT DETECTED. The SARS-CoV-2 RNA is generally detectable in upper and lower  respiratory specimens during the acute phase of infection. The lowest  concentration of SARS-CoV-2 viral copies this assay can detect is 250  copies / mL. A negative result does not preclude SARS-CoV-2 infection  and should not be used as the sole basis for treatment or other  patient management decisions.  A negative result may occur with  improper specimen collection / handling, submission of specimen other  than nasopharyngeal swab, presence of viral mutation(s)  within the  areas targeted by this assay, and inadequate number of viral copies  (<250 copies / mL). A negative result must be combined with clinical  observations, patient history, and epidemiological information. If result is POSITIVE SARS-CoV-2 target nucleic acids are DETECTED. The SARS-CoV-2 RNA is generally detectable in upper and lower  respiratory specimens dur ing the acute phase of infection.  Positive  results are indicative of active infection with SARS-CoV-2.  Clinical  correlation with patient history and other diagnostic information is  necessary to determine patient infection status.  Positive results do  not rule out bacterial infection or  co-infection with other viruses. If result is PRESUMPTIVE POSTIVE SARS-CoV-2 nucleic acids MAY BE PRESENT.   A presumptive positive result was obtained on the submitted specimen  and confirmed on repeat testing.  While 01/29/18 novel coronavirus  (SARS-CoV-2) nucleic acids may be present in the submitted sample  additional confirmatory testing may be necessary for epidemiological  and / or clinical management purposes  to differentiate between  SARS-CoV-2 and other Sarbecovirus currently known to infect humans.  If clinically indicated additional testing with an alternate test  methodology [phone]) is advised. The SARS-CoV-2 RNA is generally  detectable in upper and lower respiratory sp ecimens during the acute  phase of infection. The expected result is Negative. Fact Sheet for Patients:  StrictlyIdeas.no Fact Sheet for Healthcare Providers: BankingDealers.co.za This test is not yet approved or cleared by the [place] FDA and has been authorized for detection and/or diagnosis of SARS-CoV-2 by FDA under an Emergency Use Authorization (EUA).  This EUA will remain in effect (meaning this test can be used) for the duration of the COVID-19 declaration under Section 564(b)(1) of the Act, 21 U.S.C. section 360bbb-3(b)(1), unless the authorization is terminated or revoked sooner. Performed at [facility], [address] [place], [place] [address]   MRSA PCR Screening     Status: None   Collection Time: 05/02/19  5:10 PM   Specimen: Nasal Mucosa; Nasopharyngeal  Result Value Ref Range Status   MRSA by PCR NEGATIVE NEGATIVE Final    Comment:        The GeneXpert MRSA Assay (FDA approved for NASAL specimens only), is one component of a comprehensive MRSA colonization surveillance program. It is not intended to diagnose MRSA infection nor to guide or monitor treatment for MRSA infections. Performed at [facility],  [address] [place], [place] [address]     Coagulation Studies: Recent Labs    05/24/2019 2137/01/29  LABPROT 21.4*  INR 1.9*    Urinalysis: No results for input(s): COLORURINE, LABSPEC, PHURINE, GLUCOSEU, HGBUR, BILIRUBINUR, KETONESUR, PROTEINUR, UROBILINOGEN, NITRITE, LEUKOCYTESUR in the last 72 hours.  Invalid input(s): APPERANCEUR    Imaging: Ct Abdomen Pelvis Wo Contrast  Result Date: 05/26/2019 CLINICAL DATA:  85 year old female with abdominal pain and cephalopathy. History of rectal and head and neck cancer. EXAM: CT ABDOMEN AND PELVIS WITHOUT CONTRAST TECHNIQUE: Multidetector CT imaging of the abdomen and pelvis was performed following the standard protocol without IV contrast. COMPARISON:  PET-CT 05/14/2016 and earlier. FINDINGS: Lower chest: Small bilateral layering pleural effusions are new since 01-30-2016. No pericardial effusion. Minor lung base compressive atelectasis. Hepatobiliary: Large volume ascites with simple fluid density. Small cirrhotic appearing liver which has changed in morphology since January 30, 2016. No discrete liver lesion is evident in the absence of contrast. There is a 10 millimeter gallstone identified on series 2, image 27. There is decreased gallbladder detail due to ascites. Pancreas: Mild  chronic calcific pancreatitis is stable. Spleen: Spleen size remains normal. Adrenals/Urinary Tract: Negative adrenal glands. There are bilateral renal vascular calcifications. Noncontrast kidneys appears stable since 2017. There are chronic left gonadal vein phleboliths. The ureters are difficult to delineate. There is a urethral catheter in the bladder which is poorly delineated due to a combination of ascites and streak artifact from right hip arthroplasty. Stomach/Bowel: No dilated large or small bowel loops. The stomach is decompressed. Individual bowel loops are difficult to delineate due to the large volume ascites. No pneumoperitoneum. Vascular/Lymphatic: Severe Aortoiliac calcified  atherosclerosis. Vascular patency is not evaluated in the absence of IV contrast. No lymphadenopathy is evident. Reproductive: Surgically absent. Other: Moderate to large pelvic ascites. Musculoskeletal: There is a new small round sclerotic lesion measuring 4-5 millimeters in the left pedicle of T11 on series 2, image 8. Underlying chronically heterogeneous bone mineralization, but multiple other areas of small new sclerotic foci are noted in the lumbar spine. Right hip arthroplasty is new since 2017. No destructive osseous lesion identified. IMPRESSION: 1. Cirrhotic liver with Large Volume Ascites. Small bilateral layering pleural effusions. No discrete liver lesion in the absence of IV contrast. Spleen size remains normal. 2. Multiple small new sclerotic foci in the spine since 2017 are suspicious for sclerotic bone metastases. 3. Severe Aortic Atherosclerosis (ICD10-I70.0). 4. Cholelithiasis. Electronically Signed   By: Genevie Ann M.D.   On: 05/22/2019 17:16   Ct Head Wo Contrast  Result Date: 05/07/2019 CLINICAL DATA:  85 year old female with encephalopathy, abdominal pain. History of rectal carcinoma and head and neck cancer. EXAM: CT HEAD WITHOUT CONTRAST TECHNIQUE: Contiguous axial images were obtained from the base of the skull through the vertex without intravenous contrast. COMPARISON:  Head CT 04/01/2019 and earlier. FINDINGS: Brain: Stable cerebral volume. No ventriculomegaly. No midline shift, mass effect, or evidence of intracranial mass lesion. Stable and largely normal for age gray-white matter differentiation throughout the brain. No cortically based acute infarct identified. No acute intracranial hemorrhage identified. Vascular: Extensive Calcified atherosclerosis at the skull base. Skull: Stable.  No acute osseous abnormality identified. Sinuses/Orbits: Visualized paranasal sinuses and mastoids are stable and well pneumatized. Other: Questionable mild left forehead scalp hematoma on series 3,  image 6. Underlying left frontal bone intact. Other visible scalp and orbits soft tissues appear negative. IMPRESSION: 1. Questionable mild left forehead scalp hematoma. No underlying skull fracture. 2. Stable noncontrast CT appearance of the brain, with generalized cerebral volume loss but otherwise negative for age. Electronically Signed   By: Genevie Ann M.D.   On: 06/02/2019 16:57   US Liver Doppler  Result Date: 05/05/2019 CLINICAL DATA:  Gastric varices. EXAM: DUPLEX ULTRASOUND OF LIVER TECHNIQUE: Color and duplex Doppler ultrasound was performed to evaluate the hepatic in-flow and out-flow vessels. COMPARISON:  CT abdomen pelvis from yesterday. FINDINGS: Liver: Coarsened heterogeneous parenchymal echogenicity. Nodular contour. No focal lesion, mass or intrahepatic biliary ductal dilatation. Main Portal Vein size: 0.9 cm Portal Vein Velocities Main Prox:  45 cm/sec Main Mid: 52 cm/sec Main Dist:  60 cm/sec Right: 23 cm/sec Left: 13 cm/sec Normal hepatopetal flow in the portal veins. Hepatic Vein Velocities Right:  28 cm/sec Middle:  24 cm/sec Left:  28 cm/sec Normal hepatofugal flow in the hepatic veins. IVC: Present and patent with normal respiratory phasicity. Hepatic Artery Velocity:  113 cm/sec Splenic Vein Velocity:  31 cm/sec Spleen: 5.9 cm x 4.0 cm x 6.0 cm with a total volume of 74 cm^3 (411 cm^3 is upper limit normal) Portal Vein  Occlusion/Thrombus: No Splenic Vein Occlusion/Thrombus: No Ascites: Present Varices: None IMPRESSION: 1. Cirrhosis with portal hypertension and ascites. 2. Patent hepatic vasculature with appropriately directed flow. Electronically Signed   By: Titus Dubin M.D.   On: 05/05/2019 12:06     Medications:   . dextrose 5 % and 0.9% NaCl 50 mL/hr at 05/05/19 1542   . cholecalciferol  1,000 Units Oral Daily  . feeding supplement (ENSURE ENLIVE)  237 mL Oral BID BM  . magnesium oxide  800 mg Oral Once  . pantoprazole (PROTONIX) IV  40 mg Intravenous Q12H  . potassium  chloride  40 mEq Oral Once  . senna-docusate  1 tablet Oral Daily  . cyanocobalamin  1,000 mcg Oral Daily   acetaminophen **OR** acetaminophen, albuterol, bisacodyl, ondansetron **OR** ondansetron (ZOFRAN) IV, senna-docusate  Assessment/ Plan:  85 y.o. female with a PMHx of colorectal cancer, diverticulosis, hemorrhoids, hypertension, osteopenia, osteoarthritis, who was admitted to [facility] on 04/30/2019 for evaluation of acute renal failure.   1.  Acute renal failure/chronic kidney disease stage III baseline creatinine 1.0 with an EGFR 52/Proteinuria.  Suspect that patient developed volume depletion.  She was on spironolactone prior to admission.  -Serologic work-up negative.  Patient found to have negative ANA ANCA antibodies GBM antibodies and normal complement levels. -Creatinine continues to decline and is currently 1.5.  Maintain the patient on IV fluid hydration for now.  2.  Hypoalbuminemia.  Continue protein supplementation.   LOS: 5 Jillana Selph 7/4/20202:51 PM

## 2019-05-07 LAB — BASIC METABOLIC PANEL
Anion gap: 6 (ref 5–15)
BUN: 46 mg/dL — ABNORMAL HIGH (ref 8–23)
CO2: 17 mmol/L — ABNORMAL LOW (ref 22–32)
Calcium: 8.8 mg/dL — ABNORMAL LOW (ref 8.9–10.3)
Chloride: 124 mmol/L — ABNORMAL HIGH (ref 98–111)
Creatinine, Ser: 1.47 mg/dL — ABNORMAL HIGH (ref 0.44–1.00)
GFR calc Af Amer: 38 mL/min — ABNORMAL LOW (ref >60)
GFR calc non Af Amer: 33 mL/min — ABNORMAL LOW (ref >60)
Glucose, Bld: 141 mg/dL — ABNORMAL HIGH (ref 70–99)
Potassium: 3.6 mmol/L (ref 3.5–5.1)
Sodium: 147 mmol/L — ABNORMAL HIGH (ref 135–145)

## 2019-05-07 LAB — CBC
HCT: 34.1 % — ABNORMAL LOW (ref 36.0–46.0)
Hemoglobin: 10.8 g/dL — ABNORMAL LOW (ref 12.0–15.0)
MCH: 28.7 pg (ref 26.0–34.0)
MCHC: 31.7 g/dL (ref 30.0–36.0)
MCV: 90.7 fL (ref 80.0–100.0)
Platelets: 64 10*3/uL — ABNORMAL LOW (ref 150–400)
RBC: 3.76 MIL/uL — ABNORMAL LOW (ref 3.87–5.11)
RDW: 16.1 % — ABNORMAL HIGH (ref 11.5–15.5)
WBC: 9.1 10*3/uL (ref 4.0–10.5)
nRBC: 0.2 % (ref 0.0–0.2)

## 2019-05-07 LAB — MAGNESIUM: Magnesium: 2.1 mg/dL (ref 1.7–2.4)

## 2019-05-07 MED ORDER — PANTOPRAZOLE SODIUM 40 MG PO TBEC
40.0000 mg | DELAYED_RELEASE_TABLET | Freq: Two times a day (BID) | ORAL | Status: DC
  Filled 2019-05-07 (×2): qty 1

## 2019-05-07 NOTE — Progress Notes (Signed)
[place] [facility] [place] NAME: Emily [name]    MR#:  [id number]  DATE OF BIRTH:  August 28, 1933  SUBJECTIVE:  CHIEF COMPLAINT:   Chief Complaint  Patient presents with  . Abnormal Lab    No fevers.  Patient was resting comfortably on my arrival.  Did report some lower abdominal discomfort but does not appear to be in any distress.  Patient tolerating current diet.   REVIEW OF SYSTEMS:  ROS unobtainable due to patient's mental status  DRUG ALLERGIES:   Allergies  Allergen Reactions  . Amlodipine Besylate Other (See Comments)    Patient unaware of any allergy with this medicine  . Oxycodone     confusion   VITALS:  Blood pressure (!) 106/57, pulse 82, temperature 98.4 F (36.9 C), temperature source Oral, resp. rate 18, height 5\' 4"  (1.626 m), weight 53.4 kg, SpO2 93 %. PHYSICAL EXAMINATION:  Physical Exam   GENERAL:  85 y.o.-year-old patient lying in the bed with no acute distress.  EYES: Pupils equal, round, reactive to light and accommodation. No scleral icterus. Extraocular muscles intact.  Left frontal scalp hematoma; present on admission. HEENT: Head atraumatic, normocephalic. Oropharynx and nasopharynx clear.  NECK:  Supple, no jugular venous distention. No thyroid enlargement, no tenderness.  LUNGS: Normal breath sounds bilaterally, no wheezing, rales,rhonchi or crepitation. No use of accessory muscles of respiration.  CARDIOVASCULAR: S1, S2 normal. No murmurs, rubs, or gallops.  ABDOMEN: Soft, nontender, mildly distended . Bowel sounds present. No organomegaly or mass.  EXTREMITIES: No pedal edema, cyanosis, or clubbing.  NEUROLOGIC: Unable to exam.  Patient pleasantly confused. PSYCHIATRIC: The patient is demented.  LABORATORY PANEL:  Female CBC Recent Labs  Lab 05/07/19 0526  WBC 9.1  HGB 10.8*  HCT 34.1*  PLT 64*    ------------------------------------------------------------------------------------------------------------------ Chemistries  Recent Labs  Lab 04/23/2019 1822  05/07/19 0526  NA 134*   < > 147*  K 4.7   < > 3.6  CL 97*   < > 124*  CO2 25   < > 17*  GLUCOSE 94   < > 141*  BUN 74*   < > 46*  CREATININE 3.59*   < > 1.47*  CALCIUM 8.7*   < > 8.8*  MG  --    < > 2.1  AST 28  --   --   ALT 16  --   --   ALKPHOS 607*  --   --   BILITOT 0.6  --   --    < > = values in this interval not displayed.   RADIOLOGY:  No results found. ASSESSMENT AND PLAN:   1. Acute kidney injury  Felt to be secondary to dehydration.   Being hydrated with IV fluids with continued improvement in renal function.  Renal ultrasound with evidence of medical renal disease.  Minimal collecting system dilatation bilaterally.Lisinopril placed on hold.  Decreased rate of IV fluids. Appreciate nephrology input  2. Hyponatremia.   Likely due to dehydration.  Resolved with IV fluid hydration Hypomagnesemia.  Replaced.    3. Hypertension.  Hold lisinopril and Lopressor due to low side blood pressure.  Consider resuming Lopressor if blood pressure begins to trend up  4. Dementia.  Aspiration and fall precaution Stable.  Patient pleasantly confused.  5.  Dysphagia Nursing staff reported coughing while swallowing. Chest x-ray showed chronic interstitial lung markings.  Increasing patchy density in the left base which could be atelectasis or mild pneumonia.  Clinically no fevers.  No leukocytosis.  No clinical evidence to support pneumonia. Seen by speech therapist and recommended GI evaluation. Discussed with patient's son  who confirmed patient has had prior history of neck cancer status post radiation treatment.  Son reported she had follow-up laryngoscopy couple of months ago which was unremarkable. CT neck without contrast ordered to rule out recurrence of neck cancer. Placed on PPI IV. Patient had EGD done which  revealed reflux esophagitis.  There was mild resistance while passing the scope past the vocal cords in the upper esophagus.  No stricture or narrowing.  6.  Acute urinary retention Patient with lower abdominal pain. Patient seen by urologist.  Appreciate input.  Recommendation is to maintain Foley catheter which was placed. CT scan reviewed massive ascites and bladder.  Decompressed.  No plans for any further urological procedure at this time.  7.  Liver cirrhosis with massive ascites; New Patient very comfortable this morning with no abdominal pains but reported some mild discomfort..  Abdomen soft. Discussed with son about new diagnosis of liver cirrhosis.  Evidence of portal hypertension on liver ultrasound. Discussed doing paracentesis via ultrasound guidance with son.  Since patient appears comfortable and is high risk of reaccumulation of ascites, son wishes to hold off on paracentesis for now.  To consider paracentesis if patient becomes symptomatic from the ascites with abdominal pains  8.  New findings of multiple sclerotic foci in the lumbar spine. Discussed findings with son as well. Prior history of neck cancer status post treatment. Son does not wish to adjust any further since patient cannot withstand any surgery or chemotherapy at this time.  Focus to be primarily on keeping patient comfortable.  Palliative care already consulted.  9.  Thrombocytopenia with platelet count of 64 Likely due to underlying liver cirrhosis.  No bleeding.  Follow-up on CBC in a.m.  DVT prophylaxis; heparin previously discontinued due to thrombocytopenia SCDs  All the records are reviewed and case discussed with Care Management/Social Worker. Called and discussed with patient's son this morning; Mr. Emily [name] over the phone was monitored and updated him on treatment plans.  All questions answered.  He agrees with plan of care as outlined above Case manager working on discharge with hospice to skilled  nursing facility after the weekend.  CODE STATUS: DNR  TOTAL TIME TAKING CARE OF THIS PATIENT: 37 minutes.   More than 50% of the time was spent in counseling/coordination of care: YES  POSSIBLE D/C IN 2 DAYS, DEPENDING ON CLINICAL CONDITION.   Emily [name] M.D on 05/07/2019 at 9:49 AM  Between 7am to 6pm - Pager - [phone]  After 6pm go to [email or web address] - Proofreader  [facility]  Office  [phone]  CC: Primary care physician; [name], MD  Note: This dictation was prepared with Dragon dictation along with smaller phrase technology. Any transcriptional errors that result from this process are unintentional.

## 2019-05-08 ENCOUNTER — Other Ambulatory Visit: Payer: Self-pay

## 2019-05-08 LAB — H PYLORI, IGM, IGG, IGA AB
H Pylori IgG: 0.7 Index Value (ref 0.00–0.79)
H. Pylogi, Iga Abs: <9 units (ref 0.0–8.9)
H. Pylogi, Igm Abs: <9 units (ref 0.0–8.9)

## 2019-05-08 MED ORDER — MORPHINE SULFATE (PF) 2 MG/ML IV SOLN
2.0000 mg | INTRAVENOUS | Status: DC | PRN
  Administered 2019-05-09: 2 mg via INTRAVENOUS
  Filled 2019-05-08: qty 1

## 2019-05-08 NOTE — Progress Notes (Signed)
SLP Cancellation Note  Patient Details Name: Emily [name] MRN: [id number] DOB: May 23, 1933   Cancelled treatment:       Reason Eval/Treat Not Completed: Patient not medically ready;Medical issues which prohibited therapy(chart reviewed; consulted NSG then MD re: pt's status). Per NSG report, pt continues to c/o abdominal pain especially if she tries to eat/drink anything - she has not eaten at her meals for ~3 days d/t the discomfort, per NSG report. Per CT of abd. and GI, pt has a "Cirrhotic liver with Large Volume Ascites" - GI suspects this could be of discomfort to pt.  Due to pt's not wanting to eat d/t abdominal discomfort, ST services will just continue to monitor pt's status if any oropharyngeal phase swallowing issues or education needs arise. Recommend frequent oral care for hygiene and stimulation of swallowing. NSG agreed.     Orinda Kenner, MS, CCC-SLP [place] 05/08/2019, 1:00 PM

## 2019-05-08 NOTE — TOC Progression Note (Signed)
Transition of Care [facility]) - Progression Note    Patient Details  Name: Emily [name] MRN: [id number] Date of Birth: 05-11-1933  Transition of Care [facility]) CM/SW Contact  Shelbie Hutching, RN Phone Number: 05/08/2019, 4:24 PM  Clinical Narrative:    Son has been updated by MD.  Palliative will see patient tomorrow.  At this time patient's son would like for patient to be comfortable and go to the hospice home is appropriate.  Referral has already been given to [facility].  Marcene Brawn with [facility] is following.     Expected Discharge Plan: Long Term Nursing Home Barriers to Discharge: Continued Medical Work up  Expected Discharge Plan and Services Expected Discharge Plan: [place]       Living arrangements for the past 2 months: [place] Expected Discharge Date: 05/03/2019                                     Social Determinants of Health (SDOH) Interventions    Readmission Risk Interventions Readmission Risk Prevention Plan 05/02/2019 05/02/2019  Transportation Screening - Complete  HRI or [place] - Not Complete  HRI or Home Care Consult comments Pt came from SNF rehab. She may return there at dc vs. ALF vs. home with hospice Pt resides at [facility] in [place] for [place] Planning/Counseling - Complete  Some recent data might be hidden

## 2019-05-08 NOTE — Progress Notes (Signed)
PT Cancellation Note  Patient Details Name: Emily [name] MRN: [id number] DOB: 1933-09-18   Cancelled Treatment:    Reason Eval/Treat Not Completed: (Chart reviewed for attempted therapy session. Appears patient with slight decline over weekend; per notes not eating/drinking x3 days, planning to reconsult palliative care to discuss goals of care (anticipating some level of hospice involvement per [facility] team).  Will hold at this time until goals of care more clearly defined.)   Felisha Claytor H. Owens Shark, PT, DPT, NCS 05/08/19, 2:02 PM [phone]

## 2019-05-08 NOTE — TOC Progression Note (Signed)
Transition of Care [facility]) - Progression Note    Patient Details  Name: Emily [name] MRN: [id number] Date of Birth: 09-28-1933  Transition of Care [facility]) CM/SW Contact  Shelbie Hutching, RN Phone Number: 05/08/2019, 2:45 PM  Clinical Narrative:    Spoke with patient's son this morning about ongoing plan of care and discharge.  Patient has not been doing well over the weekend and at this time she is no longer a good candidate for rehab for SNF.  [name] is aware that the patient can still go to a facility with hospice care but that the patient and family would have to pay out of pocket.  [name] would like to speak with the MD before making a decision, if patient is hospice home appropriate he would like to do that.  Son does not want the patient to discharge to his home.   RNCM will cont to follow.     Expected Discharge Plan: Long Term Nursing Home Barriers to Discharge: Continued Medical Work up  Expected Discharge Plan and Services Expected Discharge Plan: [place]       Living arrangements for the past 2 months: [place] Expected Discharge Date: 06/02/2019                                     Social Determinants of Health (SDOH) Interventions    Readmission Risk Interventions Readmission Risk Prevention Plan 05/02/2019 05/02/2019  Transportation Screening - Complete  HRI or [place] - Not Complete  HRI or Home Care Consult comments Pt came from SNF rehab. She may return there at dc vs. ALF vs. home with hospice Pt resides at [facility] in [place] for [place] Planning/Counseling - Complete  Some recent data might be hidden

## 2019-05-08 NOTE — Progress Notes (Signed)
[facility] [place] NAME: Rosabelle Jupin    MR#:  [id number]  DATE OF BIRTH:  22-Nov-1932  SUBJECTIVE:  CHIEF COMPLAINT:   Chief Complaint  Patient presents with  . Abnormal Lab    No fevers.  Patient was resting comfortably on my arrival.  Did report some lower abdominal discomfort but does not appear to be in any distress.  As per nursing staff patient did not eat or drink much in the last 2 to 3 days.   REVIEW OF SYSTEMS:  ROS unobtainable due to patient's mental status  DRUG ALLERGIES:   Allergies  Allergen Reactions  . Amlodipine Besylate Other (See Comments)    Patient unaware of any allergy with this medicine  . Oxycodone     confusion   VITALS:  Blood pressure 120/67, pulse 95, temperature (!) 97.3 F (36.3 C), temperature source Axillary, resp. rate 20, height 5\' 4"  (1.626 m), weight 53.4 kg, SpO2 94 %. PHYSICAL EXAMINATION:  Physical Exam   GENERAL:  85 y.o.-year-old patient lying in the bed with no acute distress.  EYES: Pupils equal, round, reactive to light and accommodation. No scleral icterus. Extraocular muscles intact.  Left frontal scalp hematoma; present on admission. HEENT: Head atraumatic, normocephalic. Oropharynx and nasopharynx clear.  NECK:  Supple, no jugular venous distention. No thyroid enlargement, no tenderness.  LUNGS: Normal breath sounds bilaterally, no wheezing, rales,rhonchi or crepitation. No use of accessory muscles of respiration.  CARDIOVASCULAR: S1, S2 normal. No murmurs, rubs, or gallops.  ABDOMEN: Soft, nontender, mildly distended . Bowel sounds present. No organomegaly or mass.  EXTREMITIES: No pedal edema, cyanosis, or clubbing.  NEUROLOGIC: Unable to exam.  Patient pleasantly confused. PSYCHIATRIC: The patient is demented.  LABORATORY PANEL:  Female CBC Recent Labs  Lab 05/07/19 0526  WBC 9.1  HGB 10.8*  HCT 34.1*  PLT 64*    ------------------------------------------------------------------------------------------------------------------ Chemistries  Recent Labs  Lab 04/09/2019 1822  05/07/19 0526  NA 134*   < > 147*  K 4.7   < > 3.6  CL 97*   < > 124*  CO2 25   < > 17*  GLUCOSE 94   < > 141*  BUN 74*   < > 46*  CREATININE 3.59*   < > 1.47*  CALCIUM 8.7*   < > 8.8*  MG  --    < > 2.1  AST 28  --   --   ALT 16  --   --   ALKPHOS 607*  --   --   BILITOT 0.6  --   --    < > = values in this interval not displayed.   RADIOLOGY:  No results found. ASSESSMENT AND PLAN:   1. Acute kidney injury  Felt to be secondary to dehydration.   Being hydrated with IV fluids with continued improvement in renal function.  Renal ultrasound with evidence of medical renal disease.  Minimal collecting system dilatation bilaterally.Lisinopril placed on hold.  Decreased rate of IV fluids. Appreciate nephrology input.  2. Hyponatremia.   Likely due to dehydration.  Resolved with IV fluid hydration Hypomagnesemia.  Replaced.    3. Hypertension.  Hold lisinopril and Lopressor due to low side blood pressure.  Consider resuming Lopressor if blood pressure begins to trend up  4. Dementia.  Aspiration and fall precaution Stable.  Patient pleasantly confused.  5.  Dysphagia Nursing staff reported coughing while swallowing. Chest x-ray showed chronic interstitial lung markings.  Increasing patchy density in the left base which could be atelectasis or mild pneumonia.  Clinically no fevers.  No leukocytosis.  No clinical evidence to support pneumonia. Seen by speech therapist and recommended GI evaluation. Discussed with patient's son  who confirmed patient has had prior history of neck cancer status post radiation treatment.  Son reported she had follow-up laryngoscopy couple of months ago which was unremarkable. CT neck without contrast ordered to rule out recurrence of neck cancer. Placed on PPI IV. Patient had EGD done which  revealed reflux esophagitis.  There was mild resistance while passing the scope past the vocal cords in the upper esophagus.  No stricture or narrowing.  Patient is not eating or drinking much for last 2 to 3 days.  Prognosis is very poor due to this.  6.  Acute urinary retention Patient with lower abdominal pain. Patient seen by urologist.  Appreciate input.  Recommendation is to maintain Foley catheter which was placed. CT scan reviewed massive ascites and bladder.  Decompressed.  No plans for any further urological procedure at this time.  7.  Liver cirrhosis with massive ascites; New Patient very comfortable this morning with no abdominal pains but reported some mild discomfort..  Abdomen soft. Discussed with son about new diagnosis of liver cirrhosis.  Evidence of portal hypertension on liver ultrasound. Discussed doing paracentesis via ultrasound guidance with son.  Since patient appears comfortable and is high risk of reaccumulation of ascites, son wishes to hold off on paracentesis for now.  To consider paracentesis if patient becomes symptomatic from the ascites with abdominal pains. Son is agreeing on comfort measures.  8.  New findings of multiple sclerotic foci in the lumbar spine. Discussed findings with son as well. Prior history of neck cancer status post treatment. Son does not wish to adjust any further since patient cannot withstand any surgery or chemotherapy at this time.  Focus to be primarily on keeping patient comfortable.  Palliative care already consulted.  9.  Thrombocytopenia with platelet count of 64 Likely due to underlying liver cirrhosis.  No bleeding.  Follow-up on CBC in a.m.  DVT prophylaxis; heparin previously discontinued due to thrombocytopenia SCDs  All the records are reviewed and case discussed with Care Management/Social Worker. Called and discussed with patient's son this morning; Mr. [name] over the phone was monitored and updated him on treatment  plans.  All questions answered.  He agrees with plan of care as outlined above Case manager working on discharge with hospice to skilled nursing facility , as now patient is not eating or drinking anything, she seems to be hospice home appropriate.  I have requested revisit by palliative care.  CODE STATUS: DNR  TOTAL TIME TAKING CARE OF THIS PATIENT: 37 minutes.   More than 50% of the time was spent in counseling/coordination of care: YES  POSSIBLE D/C IN 2 DAYS, DEPENDING ON CLINICAL CONDITION.   Vaughan Basta M.D on 05/08/2019 at 2:23 PM  Between 7am to 6pm - Pager - [phone]  After 6pm go to [email or web address] - Proofreader  [facility]  Office  [phone]  CC: Primary care physician; [name], MD  Note: This dictation was prepared with Dragon dictation along with smaller phrase technology. Any transcriptional errors that result from this process are unintentional.

## 2019-05-09 LAB — CBC
HCT: 30.9 % — ABNORMAL LOW (ref 36.0–46.0)
Hemoglobin: 9.8 g/dL — ABNORMAL LOW (ref 12.0–15.0)
MCH: 29.3 pg (ref 26.0–34.0)
MCHC: 31.7 g/dL (ref 30.0–36.0)
MCV: 92.2 fL (ref 80.0–100.0)
Platelets: 44 10*3/uL — ABNORMAL LOW (ref 150–400)
RBC: 3.35 MIL/uL — ABNORMAL LOW (ref 3.87–5.11)
RDW: 17.6 % — ABNORMAL HIGH (ref 11.5–15.5)
WBC: 9.4 10*3/uL (ref 4.0–10.5)
nRBC: 1.5 % — ABNORMAL HIGH (ref 0.0–0.2)

## 2019-05-09 LAB — BASIC METABOLIC PANEL
Anion gap: 6 (ref 5–15)
BUN: 56 mg/dL — ABNORMAL HIGH (ref 8–23)
CO2: 18 mmol/L — ABNORMAL LOW (ref 22–32)
Calcium: 9.2 mg/dL (ref 8.9–10.3)
Chloride: 126 mmol/L — ABNORMAL HIGH (ref 98–111)
Creatinine, Ser: 1.76 mg/dL — ABNORMAL HIGH (ref 0.44–1.00)
GFR calc Af Amer: 30 mL/min — ABNORMAL LOW (ref >60)
GFR calc non Af Amer: 26 mL/min — ABNORMAL LOW (ref >60)
Glucose, Bld: 145 mg/dL — ABNORMAL HIGH (ref 70–99)
Potassium: 3.9 mmol/L (ref 3.5–5.1)
Sodium: 150 mmol/L — ABNORMAL HIGH (ref 135–145)

## 2019-05-09 LAB — SURGICAL PATHOLOGY

## 2019-05-09 MED ORDER — LORAZEPAM 2 MG/ML IJ SOLN
1.0000 mg | INTRAMUSCULAR | Status: DC | PRN
  Administered 2019-05-09 (×2): 1 mg via INTRAVENOUS
  Filled 2019-05-09 (×2): qty 1

## 2019-05-09 MED ORDER — BIOTENE DRY MOUTH MT LIQD: 15.0000 mL | OROMUCOSAL | Status: DC | PRN

## 2019-05-09 MED ORDER — HALOPERIDOL LACTATE 5 MG/ML IJ SOLN: 0.5000 mg | INTRAMUSCULAR | Status: DC | PRN

## 2019-05-09 MED ORDER — GLYCOPYRROLATE 0.2 MG/ML IJ SOLN
0.2000 mg | INTRAMUSCULAR | Status: DC | PRN
  Administered 2019-05-09: 0.2 mg via INTRAVENOUS
  Filled 2019-05-09 (×3): qty 1

## 2019-05-09 MED ORDER — POLYVINYL ALCOHOL 1.4 % OP SOLN
1.0000 [drp] | Freq: Four times a day (QID) | OPHTHALMIC | Status: DC | PRN
  Filled 2019-05-09: qty 15

## 2019-05-09 MED ORDER — MORPHINE SULFATE (PF) 2 MG/ML IV SOLN
2.0000 mg | INTRAVENOUS | Status: DC | PRN
  Administered 2019-05-09 (×2): 2 mg via INTRAVENOUS
  Filled 2019-05-09 (×2): qty 1

## 2019-05-09 MED ORDER — SODIUM CHLORIDE 0.9% FLUSH: 3.0000 mL | INTRAVENOUS | Status: DC | PRN

## 2019-05-09 MED ORDER — SODIUM CHLORIDE 0.9% FLUSH
3.0000 mL | Freq: Two times a day (BID) | INTRAVENOUS | Status: DC
  Administered 2019-05-09: 3 mL via INTRAVENOUS

## 2019-05-09 NOTE — Progress Notes (Signed)
Follow up on current palliative care patient at [facility]. This RN received a call from [place] that Ms. Cardy has had a big change and family is now considering hospice home.  No beds available today- but information faxed to referral intake [facility], for approval for hosp[facility] home.  Will continue to follow up and communicate with [facility] team.  Dimas Aguas, RN Clinical Nurse Liaison [facility] [phone]

## 2019-05-09 NOTE — TOC Progression Note (Signed)
Transition of Care [facility]) - Progression Note    Patient Details  Name: Emily [name] MRN: [id number] Date of Birth: Jul 03, 1933  Transition of Care [facility]) CM/SW Contact  Shelbie Hutching, RN Phone Number: 05/09/2019, 3:13 PM  Clinical Narrative:     [name] [name] came in to visit today.  [name] and his wife [name] noted an old bruise to the patient's face and wanted to know when it happened, [name] states that he was never told about a fall.  RNCM reached out to [facility] to see if they had any notes on the patient falling and they report no falls.  Patient was noted to have the bruise on arrival to the emergency department- documented in the RN to Inpatient Handoff at the bottom of the handoff that old bruise noted to left forehead, pt unable to recall incident.    Expected Discharge Plan: Long Term Nursing Home Barriers to Discharge: Continued Medical Work up  Expected Discharge Plan and Services Expected Discharge Plan: [place]       Living arrangements for the past 2 months: [place] Expected Discharge Date: 05/28/2019                                     Social Determinants of Health (SDOH) Interventions    Readmission Risk Interventions Readmission Risk Prevention Plan 05/02/2019 05/02/2019  Transportation Screening - Complete  HRI or [place] - Not Complete  HRI or Home Care Consult comments Pt came from SNF rehab. She may return there at dc vs. ALF vs. home with hospice Pt resides at [facility] in [place] for [place] Planning/Counseling - Complete  Some recent data might be hidden

## 2019-05-09 NOTE — Progress Notes (Signed)
Witnessed [name], Son/POA with Asencion Gowda, NP in the decision to transition to comfort care measures. Madlyn Frankel, RN

## 2019-05-09 NOTE — TOC Progression Note (Addendum)
Transition of Care [facility]) - Progression Note    Patient Details  Name: Emily [name] MRN: [id number] Date of Birth: 12-22-1932  Transition of Care [facility]) CM/SW Contact  Yeison Sippel, Lenice Llamas Phone Number: [phone]  05/09/2019, 11:11 AM  Clinical Narrative: Per palliative NP patient does meet criteria for the hospice facility. Clinical Education officer, museum (CSW) attempted to contact patient's son [name] however [name]'s wife [name] answered. Per Ellene Route is on the other line with someone at [name]. CSW explained that per palliative NP patient meets criteria for the hospice facility. CSW provided hospice facility choice and [name] chose [facility] on [address], [place]. Per [name] that is where [name] wants patient to go because it is close to their home. CSW left [facility] hospice liaison a voicemail making her aware of above.   Marcene Brawn called CSW back and reported that patient is on the wait list for the hospice facility.    Expected Discharge Plan: Long Term Nursing Home Barriers to Discharge: Continued Medical Work up  Expected Discharge Plan and Services Expected Discharge Plan: [place]       Living arrangements for the past 2 months: [place] Expected Discharge Date: 05/16/2019                                     Social Determinants of Health (SDOH) Interventions    Readmission Risk Interventions Readmission Risk Prevention Plan 05/02/2019 05/02/2019  Transportation Screening - Complete  HRI or [place] - Not Complete  HRI or Home Care Consult comments Pt came from SNF rehab. She may return there at dc vs. ALF vs. home with hospice Pt resides at [facility] in [place] for [place] Planning/Counseling - Complete  Some recent data might be hidden

## 2019-05-09 NOTE — Progress Notes (Addendum)
Daily Progress Note   Patient Name: Emily [name]       Date: 05/09/2019 DOB: 10/02/1933  Age: 85 y.o. MRN#: [id number] Attending Physician: Vaughan Basta, * Primary Care Physician: [name], MD Admit Date: 04/04/2019  Reason for Consultation/Follow-up: Establishing goals of care  Subjective: Patient is resting in bed. She states "my stomach hurts". She was unable to answer my questions. Per staff, she has not been eating or drinking.    Called to speak with son. We discussed her diagnosis, prognosis, GOC, EOL wishes disposition and options.  A detailed discussion was had today regarding advanced directives.  Concepts specific to artifical feeding and hydration, paracentesis, and rehospitalization were discussed.  The difference between an aggressive medical intervention path and a comfort care path was discussed.  Values and goals of care important to patient and family were attempted to be elicited.  Discussed limitations of medical interventions to prolong quality of life in some situations and discussed the concept of human mortality. Natural trajectory and expectations at EOL were discussed. He is concerned for her comfort and quality of life. Mr. Montrose states he has spoken with the primary teams and has decided to proceed with hospice home placement if possible.    Questions and concerns addressed.     Addendum: Spoke with son via phone, confirmed comfort care at this time with plans for hospice facility. Witnessed by primary RN Shae.     Length of Stay: 8  Current Medications: Scheduled Meds:  . cholecalciferol  1,000 Units Oral Daily  . feeding supplement (ENSURE ENLIVE)  237 mL Oral BID BM  . magnesium oxide  800 mg Oral Once  . pantoprazole  40 mg Oral  BID  . potassium chloride  40 mEq Oral Once  . senna-docusate  1 tablet Oral Daily  . cyanocobalamin  1,000 mcg Oral Daily    Continuous Infusions: . dextrose 5 % and 0.9% NaCl 50 mL/hr at 05/09/19 0846    PRN Meds: acetaminophen **OR** acetaminophen, albuterol, bisacodyl, morphine injection, ondansetron **OR** ondansetron (ZOFRAN) IV, senna-docusate  Physical Exam Pulmonary:     Effort: Pulmonary effort is normal.  Neurological:     Mental Status: She is alert.     Comments: Confused.              Vital  Signs: BP 113/62 (BP Location: Left Arm)   Pulse 100   Temp (!) 96.8 F (36 C) (Axillary)   Resp 20   Ht 5\' 4"  (1.626 m)   Wt 53.4 kg   SpO2 100%   BMI 20.21 kg/m  SpO2: SpO2: 100 % O2 Device: O2 Device: Nasal Cannula O2 Flow Rate: O2 Flow Rate (L/min): 2 L/min  Intake/output summary:   Intake/Output Summary (Last 24 hours) at 05/09/2019 1109 Last data filed at 05/09/2019 0846 Gross per 24 hour  Intake 3887.89 ml  Output 200 ml  Net 3687.89 ml   LBM: Last BM Date: 05/03/19 Baseline Weight: Weight: 52.8 kg Most recent weight: Weight: 53.4 kg       Palliative Assessment/Data: 20% at best.      Patient Active Problem List   Diagnosis Date Noted  . Dysphasia   . Abdominal pain   . 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 ARF (acute renal failure) ([place]) 04/24/2019  . Pressure injury of skin 04/01/2019  . UTI (urinary tract infection) 03/31/2019  . Acute on chronic renal failure ([place]) 03/31/2019  . 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 Systolic dysfunction 88/82/8003  . Malnutrition of moderate degree 07/21/2016  . Lower GI bleed 07/18/2016  . History of non-ST elevation myocardial infarction (NSTEMI) 06/21/2016  . Closed right hip fracture ([place]) 06/21/2016  . Ischemic chest pain ([place]) 06/21/2016  . 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 Cancer of contiguous sites of hypopharynx ([place]) 12/23/2015  . History of colon cancer 05/09/2015  .  Diverticulosis 05/09/2015  . Fatigue 05/09/2015  . Hemorrhoid 05/09/2015  . Hypertension 05/09/2015  . Insomnia 05/09/2015  . Osteopenia 05/09/2015  . Skin lesion of back 05/09/2015  . Tobacco abuse 05/09/2015  . Vitamin D deficiency 05/09/2015  . Acid reflux 06/13/2013  . Arthritis, degenerative 06/13/2013  . Disuse syndrome 06/13/2013  . Rectal cancer ([place]) 01/20/2013    Palliative Care Assessment & Plan   Assessment: Patient has dementia at baseline.  Currently has dysphagia,  AKI 2/2 dehydration, electrolyte imbalances, and thrombocytopenia. New diagnosis of cirrhosis with massive ascites.   Recommendations/Plan:  Recommend hospice facility.   Morphine in place for pain Zofran in place for nausea.   Code Status:    Code Status Orders  (From admission, onward)         Start     Ordered   04/04/2019 2256  Do not attempt resuscitation (DNR)  Continuous    Question Answer Comment  In the event of cardiac or respiratory ARREST Do not call a "code blue"   In the event of cardiac or respiratory ARREST Do not perform Intubation, CPR, defibrillation or ACLS   In the event of cardiac or respiratory ARREST Use medication by any route, position, wound care, and other measures to relive pain and suffering. May use oxygen, suction and manual treatment of airway obstruction as needed for comfort.      04/30/2019 2255        Code Status History    Date Active Date Inactive Code Status Order ID Comments User Context   04/02/2019 1146 04/04/2019 2159 DNR [id number]  [name], MD Inpatient   04/01/2019 0026 04/02/2019 1146 Full Code [id number]  Lance Coon, MD Inpatient   07/19/2016 0123 07/21/2016 1905 Full Code [id number]  Harvie Bridge, DO Inpatient   07/19/2016 0031 07/19/2016 0123 DNR [id number]  Harvie Bridge, DO ED   06/24/2016 1607 06/26/2016 1649 DNR [id number]  Hessie Knows, MD Inpatient  06/21/2016 2029 06/22/2016 0900 DNR [id number]  Vaughan Basta, MD  Inpatient   Advance Care Planning Activity    Advance Directive Documentation     Most Recent Value  Type of Advance Directive  Out of facility DNR (pink MOST or yellow form)  Pre-existing out of facility DNR order (yellow form or pink MOST form)  Yellow form placed in chart (order not valid for inpatient use)  "MOST" Form in Place?  -       Prognosis:   < 2 weeks  Sips and bites at most. No paracentesis for ascites.   Discharge Planning:  Hospice facility  Care plan was discussed with CM/SW.   Thank you for allowing the Palliative Medicine Team to assist in the care of this patient.   Time In: 10:40 Time Out: 11:25 Total Time 45 min Prolonged Time Billed no      Greater than 50%  of this time was spent counseling and coordinating care related to the above assessment and plan.  Asencion Gowda, NP  Please contact Palliative Medicine Team phone at [phone] for questions and concerns.

## 2019-05-09 NOTE — Progress Notes (Signed)
PT Cancellation Note  Patient Details Name: Emily [name] MRN: [id number] DOB: 1933/04/04   Cancelled Treatment:    Reason Eval/Treat Not Completed: Medical issues which prohibited therapy(Per chart review, patient/family planning for transition to comfort care with transition to hospice home at discharge.  Will complete PT order at this time. Please re-consult should goals of care/patient needs change.)   Jennessy Sandridge H. Owens Shark, PT, DPT, NCS 05/09/19, 11:41 AM [phone]

## 2019-05-09 NOTE — Progress Notes (Signed)
Nutrition Brief Follow-Up Note  Chart reviewed. Plan is for patient to transition to comfort care. Plan is for discharge to hospice home once bed is available.  No further nutrition interventions warranted at this time.  Recommend discontinuing Ensure Enlive as patient is not eating or drinking per chart. Please consult RD as needed.   Willey Blade, MS, [place], LDN Office: [phone] Pager: [phone] After Hours/Weekend Pager: [phone]

## 2019-05-09 NOTE — Progress Notes (Signed)
[facility] NAME: Emily [name]    MR#:  [id number]  DATE OF BIRTH:  06-Oct-1933  SUBJECTIVE:  CHIEF COMPLAINT:   Chief Complaint  Patient presents with  . Abnormal Lab    No fevers.  Patient was resting comfortably on my arrival.  Did report some lower abdominal discomfort but does not appear to be in any distress.  As per nursing staff patient did not eat or drink much in the last 3-4 days.   REVIEW OF SYSTEMS:  ROS unobtainable due to patient's mental status  DRUG ALLERGIES:   Allergies  Allergen Reactions  . Amlodipine Besylate Other (See Comments)    Patient unaware of any allergy with this medicine  . Oxycodone     confusion   VITALS:  Blood pressure 113/62, pulse 100, temperature (!) 96.8 F (36 C), temperature source Axillary, resp. rate 20, height 5\' 4"  (1.626 m), weight 53.4 kg, SpO2 100 %. PHYSICAL EXAMINATION:  Physical Exam   GENERAL:  85 y.o.-year-old patient lying in the bed with no acute distress.  EYES: Pupils equal, round, reactive to light and accommodation. No scleral icterus. Extraocular muscles intact.  Left frontal scalp hematoma; present on admission. HEENT: Head atraumatic, normocephalic. Oropharynx and nasopharynx clear.  NECK:  Supple, no jugular venous distention. No thyroid enlargement, no tenderness.  LUNGS: Normal breath sounds bilaterally, no wheezing, rales,rhonchi or crepitation. No use of accessory muscles of respiration.  CARDIOVASCULAR: S1, S2 normal. No murmurs, rubs, or gallops.  ABDOMEN: Soft, nontender, mildly distended . Bowel sounds present. No organomegaly or mass.  EXTREMITIES: No pedal edema, cyanosis, or clubbing.  NEUROLOGIC: Unable to exam.  Patient pleasantly confused. PSYCHIATRIC: The patient is demented.  LABORATORY PANEL:  Female CBC Recent Labs  Lab 05/09/19 1123  WBC 9.4  HGB 9.8*  HCT 30.9*  PLT 44*    ------------------------------------------------------------------------------------------------------------------ Chemistries  Recent Labs  Lab 05/07/19 0526 05/09/19 0848  NA 147* 150*  K 3.6 3.9  CL 124* 126*  CO2 17* 18*  GLUCOSE 141* 145*  BUN 46* 56*  CREATININE 1.47* 1.76*  CALCIUM 8.8* 9.2  MG 2.1  --    RADIOLOGY:  No results found. ASSESSMENT AND PLAN:   1. Acute kidney injury  Felt to be secondary to dehydration.   Being hydrated with IV fluids with continued improvement in renal function.  Renal ultrasound with evidence of medical renal disease.  Minimal collecting system dilatation bilaterally.Lisinopril placed on hold.  Decreased rate of IV fluids. Appreciate nephrology input.  2. Hyponatremia.   Likely due to dehydration.  Resolved with IV fluid hydration Hypomagnesemia.  Replaced.    3. Hypertension.  Hold lisinopril and Lopressor due to low side blood pressure.  Consider resuming Lopressor if blood pressure begins to trend up  4. Dementia.  Aspiration and fall precaution Stable.  Patient pleasantly confused.  5.  Dysphagia Nursing staff reported coughing while swallowing. Chest x-ray showed chronic interstitial lung markings.  Increasing patchy density in the left base which could be atelectasis or mild pneumonia.  Clinically no fevers.  No leukocytosis.  No clinical evidence to support pneumonia. Seen by speech therapist and recommended GI evaluation. Discussed with patient's son  who confirmed patient has had prior history of neck cancer status post radiation treatment.  Son reported she had follow-up laryngoscopy couple of months ago which was unremarkable. CT neck without contrast ordered to rule out recurrence of neck cancer. Placed on PPI IV. Patient  had EGD done which revealed reflux esophagitis.  There was mild resistance while passing the scope past the vocal cords in the upper esophagus.  No stricture or narrowing.  Patient is not eating or  drinking much for last 2 to 3 days.  Prognosis is very poor due to this.  6.  Acute urinary retention Patient with lower abdominal pain. Patient seen by urologist.  Appreciate input.  Recommendation is to maintain Foley catheter which was placed. CT scan reviewed massive ascites and bladder.  Decompressed.  No plans for any further urological procedure at this time.  7.  Liver cirrhosis with massive ascites; New Patient very comfortable this morning with no abdominal pains but reported some mild discomfort..  Abdomen soft. Discussed with son about new diagnosis of liver cirrhosis.  Evidence of portal hypertension on liver ultrasound. Discussed doing paracentesis via ultrasound guidance with son.  Since patient appears comfortable and is high risk of reaccumulation of ascites, son wishes to hold off on paracentesis for now.    Son is agreeing on comfort measures.  8.  New findings of multiple sclerotic foci in the lumbar spine. Discussed findings with son as well. Prior history of neck cancer status post treatment. Son does not wish to adjust any further since patient cannot withstand any surgery or chemotherapy at this time.  Focus to be primarily on keeping patient comfortable.  Palliative care already consulted.  9.  Thrombocytopenia with platelet count of 64 Likely due to underlying liver cirrhosis.  No bleeding.  Follow-up on CBC in a.m.  DVT prophylaxis; heparin previously discontinued due to thrombocytopenia SCDs  All the records are reviewed and case discussed with Care Management/Social Worker. Called and discussed with patient's son this morning; Mr. [name] over the phone was monitored and updated him on treatment plans.  All questions answered.  He agrees with plan of care as outlined above Case manager working on discharge with hospice to skilled nursing facility , as now patient is not eating or drinking anything, she seems to be hospice home appropriate.  I have requested revisit  by palliative care. No beds in hospice home today.  CODE STATUS: DNR  TOTAL TIME TAKING CARE OF THIS PATIENT: 32 minutes.   More than 50% of the time was spent in counseling/coordination of care: YES  POSSIBLE D/C IN 2 DAYS, DEPENDING ON CLINICAL CONDITION.   Vaughan Basta M.D on 05/09/2019 at 12:41 PM  Between 7am to 6pm - Pager - [phone]  After 6pm go to [email or web address] - Proofreader  [employer] [facility]  Office  [phone]  CC: Primary care physician; [name], MD  Note: This dictation was prepared with Dragon dictation along with smaller phrase technology. Any transcriptional errors that result from this process are unintentional.

## 2019-06-03 NOTE — Progress Notes (Signed)
Family came to patients bedside to view the body. Family did not take the patients belongings they were left with patient and placed in body bag.

## 2019-06-03 NOTE — Progress Notes (Signed)
Follow up on current palliative care patient of ours ([facility]) who is now looking for a bed at the [facility].  Patient is appropriate, but earlier in the day we did not have a bed.  I called the patient's son, Emily [name], to notify him that we will have a bed tomorrow (May 17, 2019) and are looking at transport at 1230 if Dr. Gilford Rile approves her.  We spoke at length regarding inpatient hospice criteria and hospice philosophy.  We discussed her current meds, the death process and reasons behind the  Medications that are being given. He is concerned that the Morphine is causing her abdominal pain.   I discussed with Emily [name] his mother's current diagnoses and that with large amount of ascites she is having as well as her shortness of breath - the pain is likely related to that and not the morphine. I reviewed terminal agitation- as he was concerned his mother seemed anxious this afternoon when he saw her.  I notified him that they do have meds for anxiety/aggitation and that she was given a dose around 6pm.  He request to come to the hospice home at noon tomorrow to complete paperwork in person with Clenton Pare SW at the hospice home at 1200.  [name] seems to be very intelligent and wants to be told the reasons behind the things we are doing for his mother.  I assured him our team at the hospice home are very transparent in their communication,- and will provide education about the dying process and his mother's care.   Report was given to [place] and Sharlyne Pacas - Unit Coordinator at the [facility].  I will notify [facility] SW in the morning with an official bed offer. Total time spent with patient's son Emily [name])-40 minutes.

## 2019-06-03 NOTE — Death Summary Note (Signed)
Pt died on 05/14/2019 morning  Cause of death- Acute renal failure- dehydration secondary to Dementia, Metastatic cancer unknown primary and liver cirrhosis  Hospital course  1. Acute kidney injury  Felt to be secondary to dehydration.   Being hydrated with IV fluids with continued improvement in renal function.  Renal ultrasound with evidence of medical renal disease.  Minimal collecting system dilatation bilaterally.Lisinopril placed on hold.  Decreased rate of IV fluids. Appreciate nephrology input.  2. Hyponatremia.  Likely due to dehydration.  Resolved with IV fluid hydration Hypomagnesemia.  Replaced.    3. Hypertension. Hold lisinopril and Lopressor due to low side blood pressure.  Consider resuming Lopressor if blood pressure begins to trend up  4. Dementia. Aspiration and fall precaution Stable.  Patient pleasantly confused.  5.  Dysphagia Nursing staff reported coughing while swallowing. Chest x-ray showed chronic interstitial lung markings.  Increasing patchy density in the left base which could be atelectasis or mild pneumonia.  Clinically no fevers.  No leukocytosis.  No clinical evidence to support pneumonia. Seen by speech therapist and recommended GI evaluation. Discussed with patient's son  who confirmed patient has had prior history of neck cancer status post radiation treatment.  Son reported she had follow-up laryngoscopy couple of months ago which was unremarkable. CT neck without contrast ordered to rule out recurrence of neck cancer. Placed on PPI IV. Patient had EGD done which revealed reflux esophagitis.  There was mild resistance while passing the scope past the vocal cords in the upper esophagus.  No stricture or narrowing.  Patient is not eating or drinking much for last 2 to 3 days.  Prognosis is very poor due to this.  6.  Acute urinary retention Patient with lower abdominal pain. Patient seen by urologist.  Appreciate input.  Recommendation is to  maintain Foley catheter which was placed. CT scan reviewed massive ascites and bladder.  Decompressed.  No plans for any further urological procedure at this time.  7.  Liver cirrhosis with massive ascites; New Patient very comfortable this morning with no abdominal pains but reported some mild discomfort..  Abdomen soft. Discussed with son about new diagnosis of liver cirrhosis.  Evidence of portal hypertension on liver ultrasound. Discussed doing paracentesis via ultrasound guidance with son.  Since patient appears comfortable and is high risk of reaccumulation of ascites, son wishes to hold off on paracentesis for now.    Son is agreeing on comfort measures.  8.  New findings of multiple sclerotic foci in the lumbar spine. Discussed findings with son as well. Prior history of neck cancer status post treatment. Son does not wish to adjust any further since patient cannot withstand any surgery or chemotherapy at this time.  Focus to be primarily on keeping patient comfortable.  Palliative care already consulted.  9.  Thrombocytopenia with platelet count of 64 Likely due to underlying liver cirrhosis.  No bleeding.  Follow-up on CBC in a.m.  DVT prophylaxis; heparin previously discontinued due to thrombocytopenia SCDs  All the records are reviewed and case discussed with Care Management/Social Worker. Called and discussed with patient's son this morning; Mr. [name] over the phone was monitored and updated him on treatment plans.  All questions answered.  He agrees with plan of care as outlined above Case manager working on discharge with hospice to skilled nursing facility , as now patient is not eating or drinking anything, she seems to be hospice home appropriate.  I have requested revisit by palliative care. Plan was to  transfer to hospice facility when bed is available, but pt passed away in comfort in hospital.

## 2019-06-03 DEATH — deceased

## 2019-09-02 IMAGING — DX ABDOMEN - 1 VIEW
2 series · 2 of 2 positions shown · non-contrast
Comparison: 05/13/2013 abdominal CT

CLINICAL DATA: Abdominal distension

EXAM:
ABDOMEN - 1 VIEW

[abdomen supine (1 of 2)]
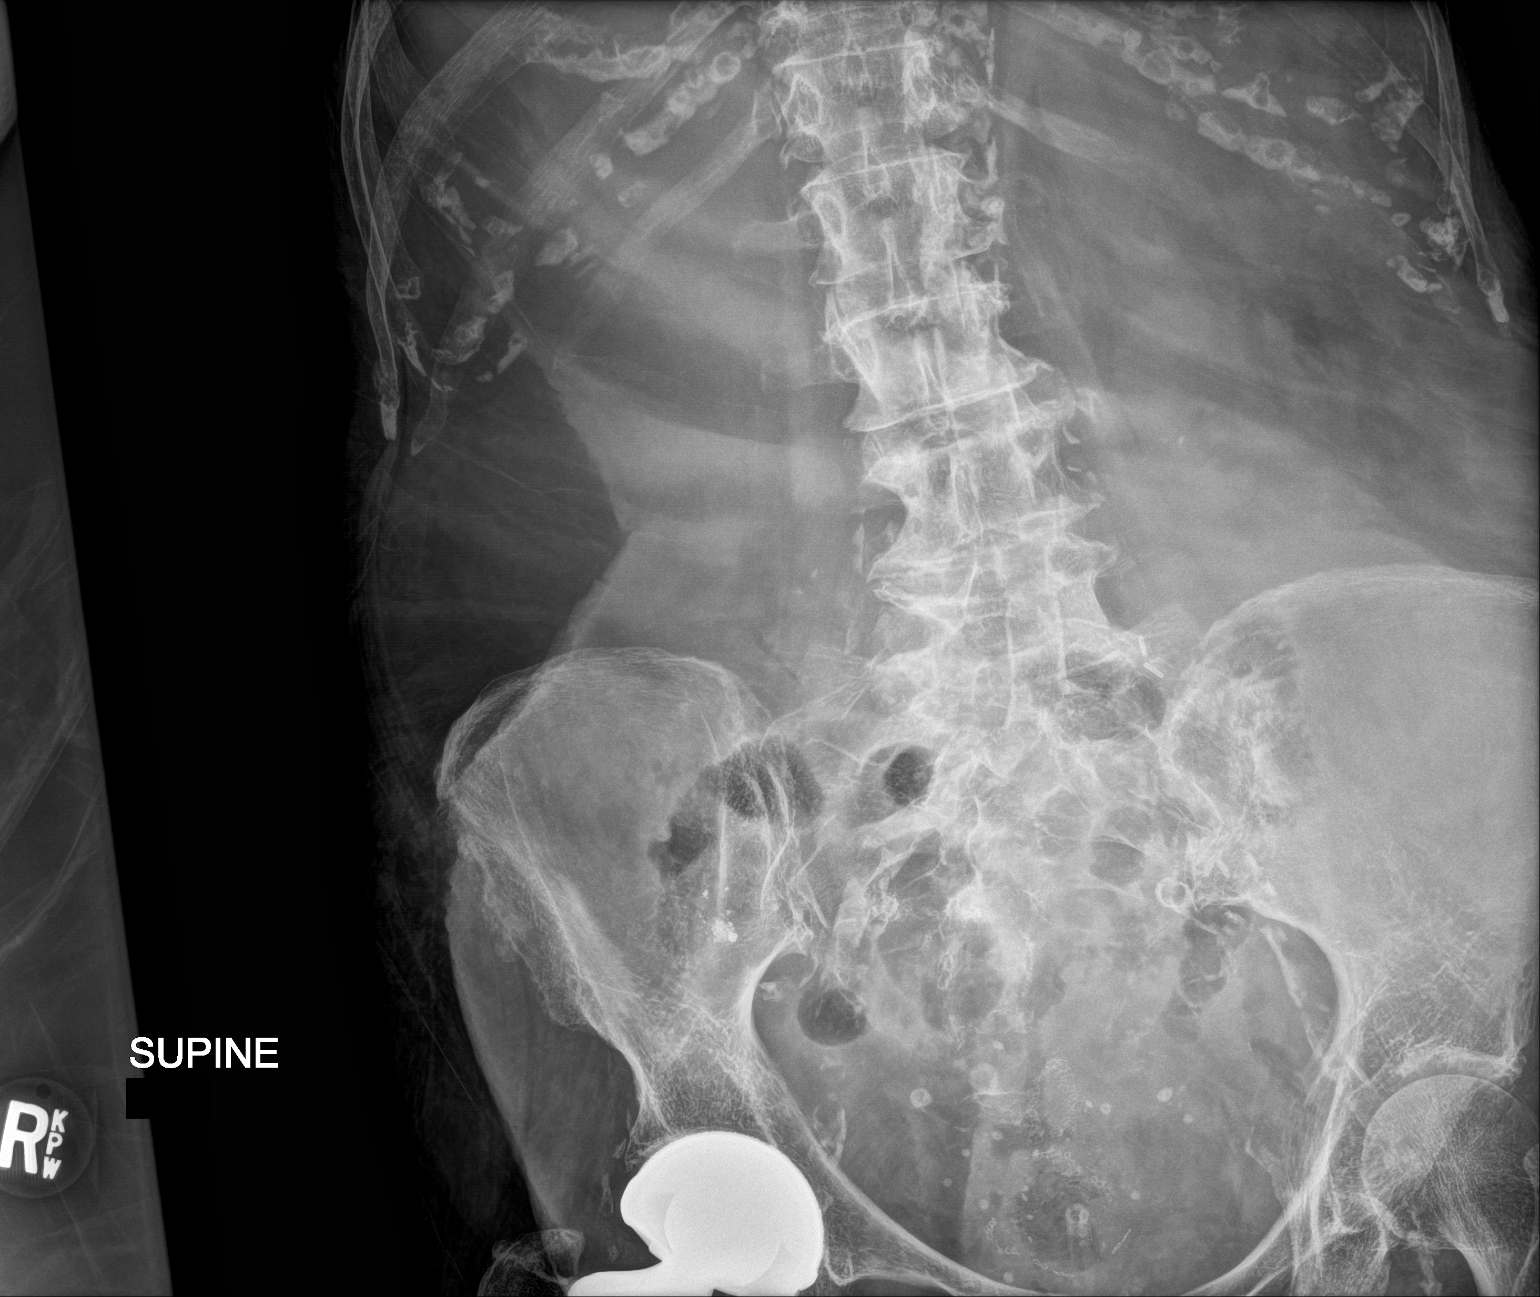

[abdomen supine (2 of 2)]
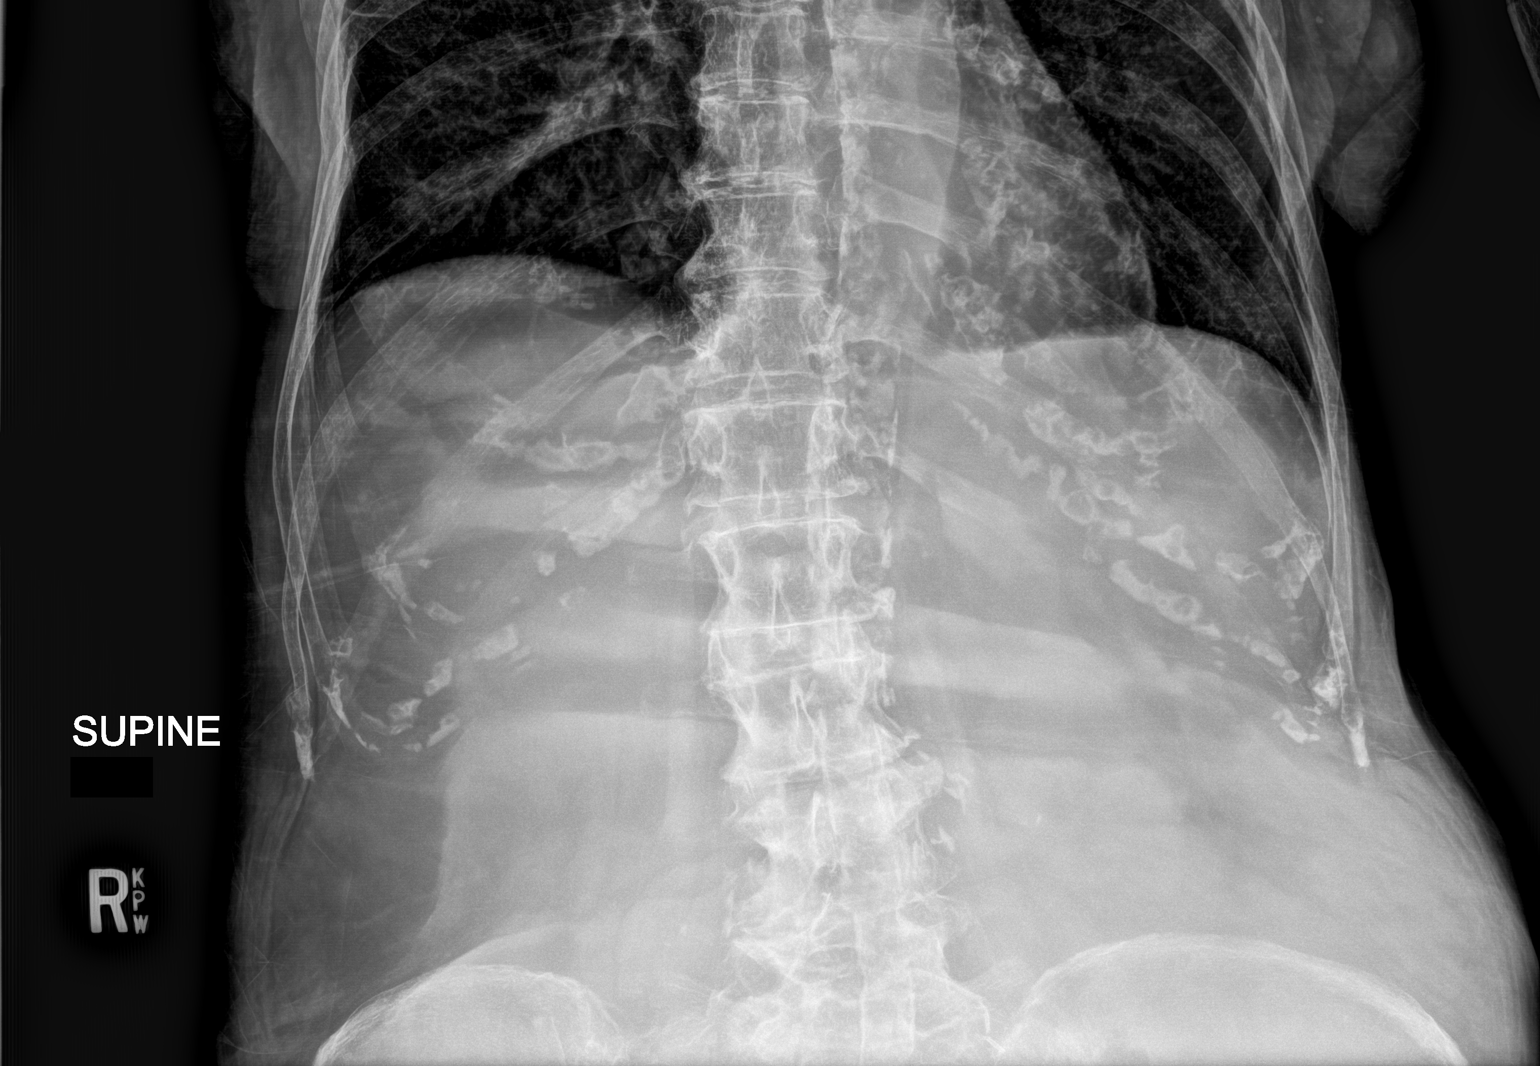

[2 of 2 positions shown; findings below may reference images not displayed]

FINDINGS: Relatively gasless abdomen with no evidence of obstruction. No
concerning mass effect or gas collection. Postoperative changes in
the pelvis with bowel sutures. Atherosclerotic calcification. Clear
lung bases.
IMPRESSION: Nonobstructive bowel gas pattern.  No abnormal stool retention.

## 2019-09-02 IMAGING — CT CT ABDOMEN AND PELVIS WITHOUT CONTRAST
2 of 4 series · 16 of 46 positions shown, 18 images · non-contrast
Comparison: PET-CT 05/14/2016 and earlier.

CLINICAL DATA: 85-year-old female with abdominal pain and
cephalopathy. History of rectal and head and neck cancer.

EXAM:
CT ABDOMEN AND PELVIS WITHOUT CONTRAST
TECHNIQUE: Multidetector CT imaging of the abdomen and pelvis was performed
following the standard protocol without IV contrast.

[Series 2: routine abd/pel wo · axial · 0.75mm/px · z∈[-840,-456]mm · 13 of 85 slices shown, 15 images]
[im 4/85  soft-tissue]
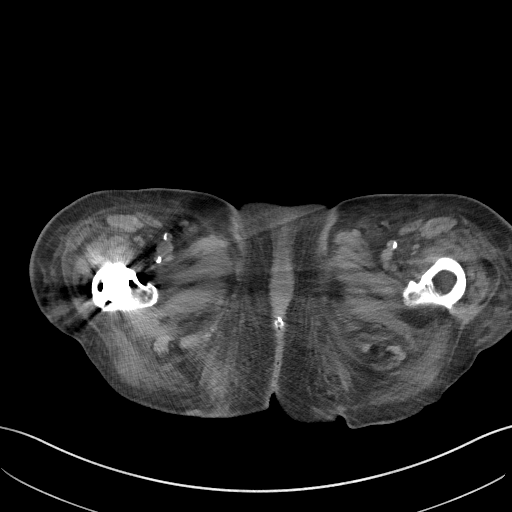
[im 4/85  bone]
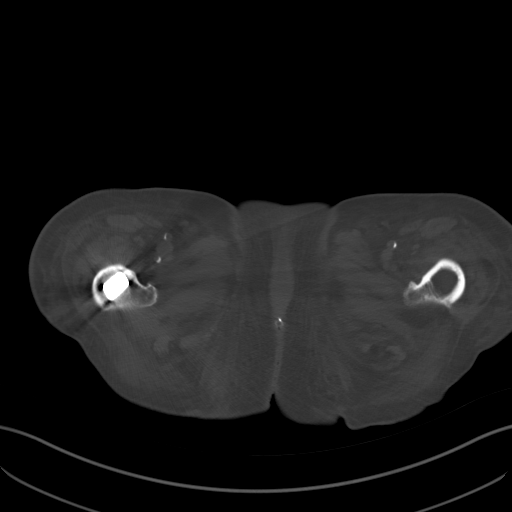
[im 10/85  soft-tissue]
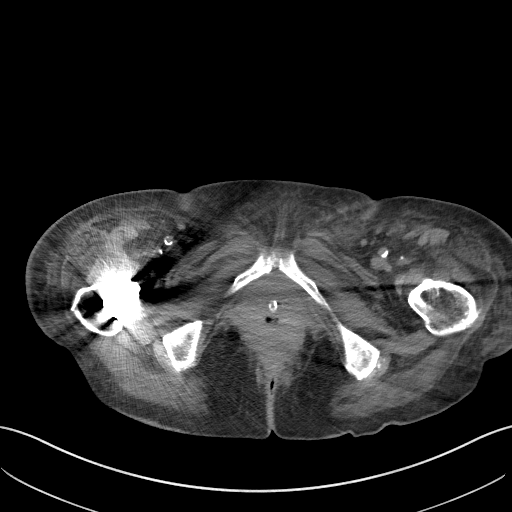
[im 17/85  soft-tissue]
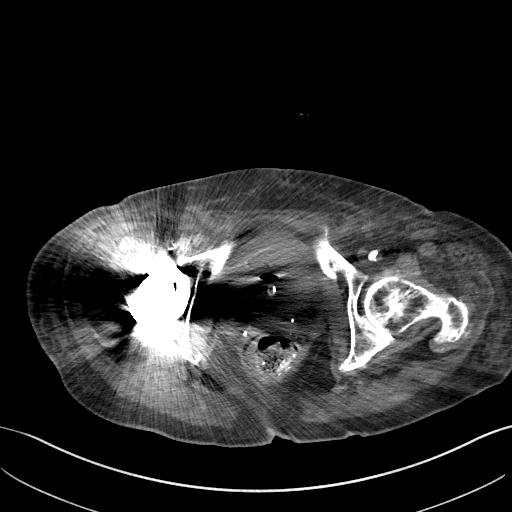
[im 26/85  soft-tissue]
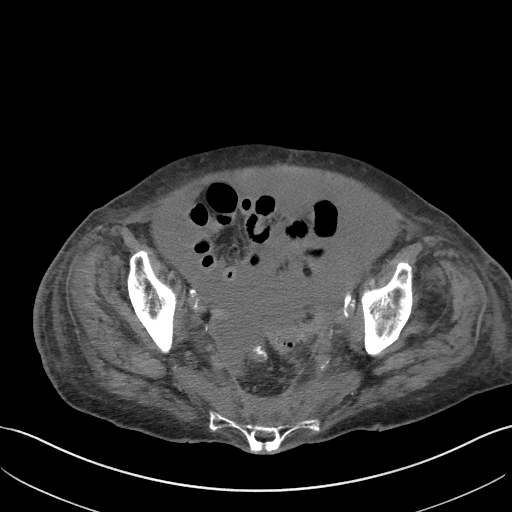
[im 33/85  soft-tissue]
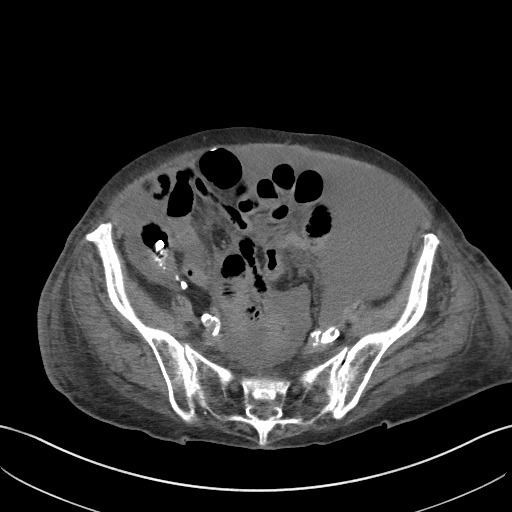
[im 39/85  soft-tissue]
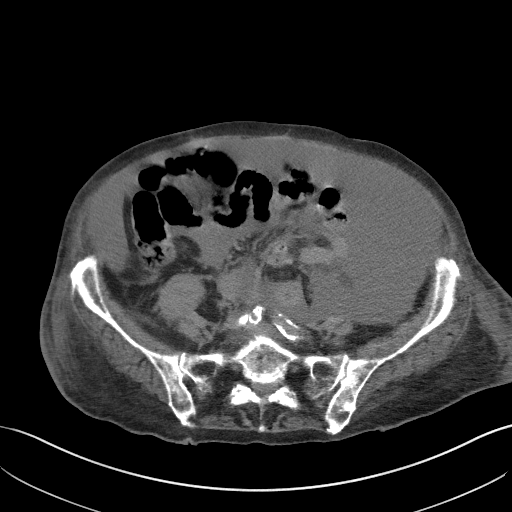
[im 46/85  soft-tissue]
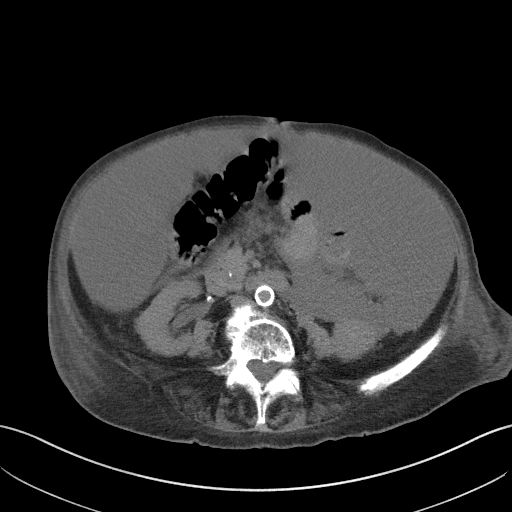
[im 49/85  soft-tissue]
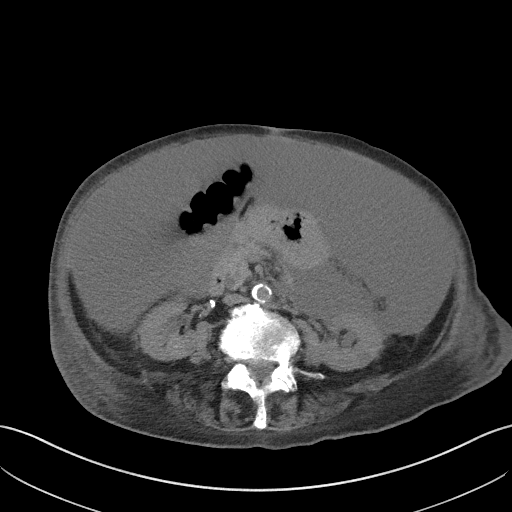
[im 55/85  soft-tissue]
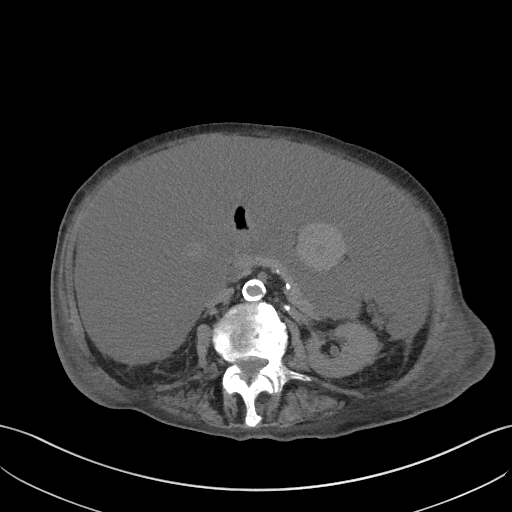
[im 55/85  bone]
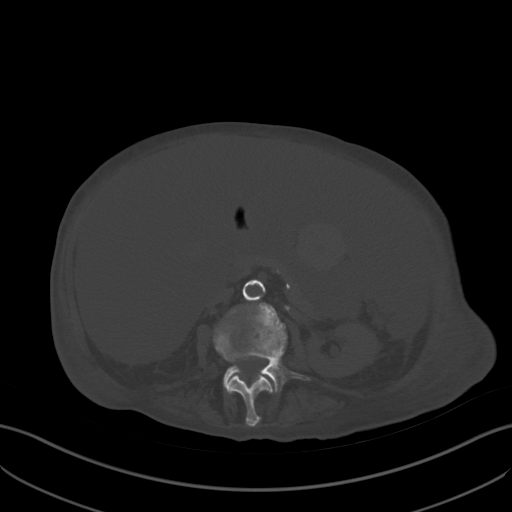
[im 62/85  soft-tissue]
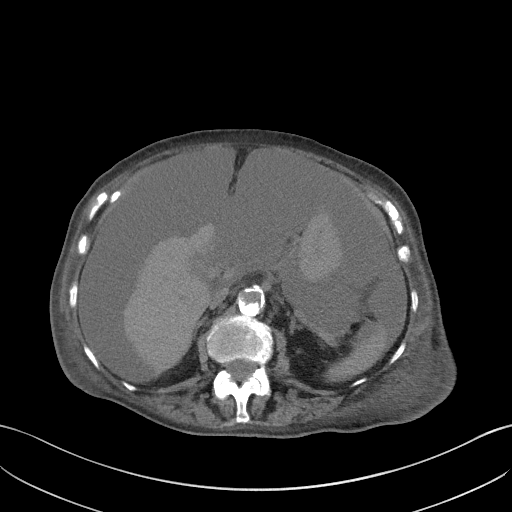
[im 68/85  soft-tissue]
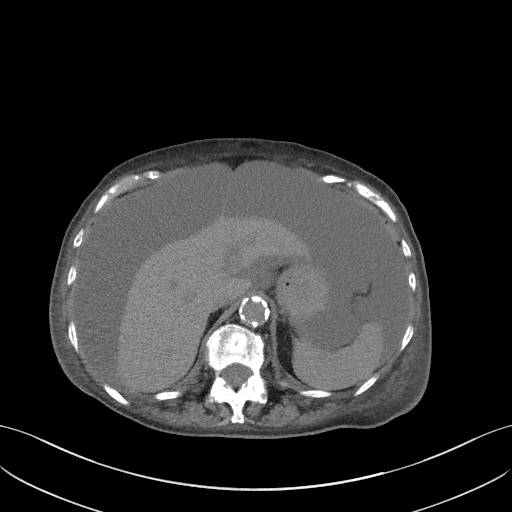
[im 75/85  soft-tissue]
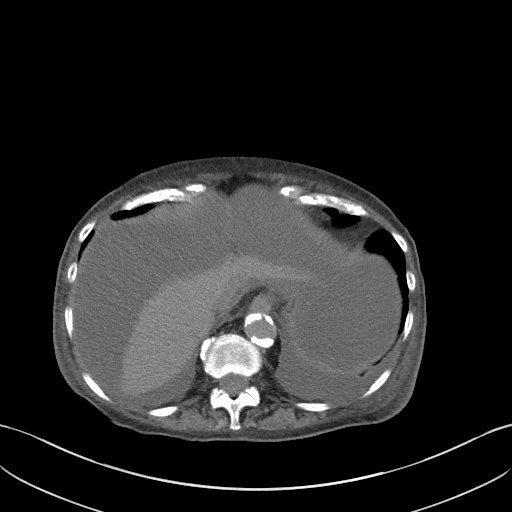
[im 81/85  soft-tissue]
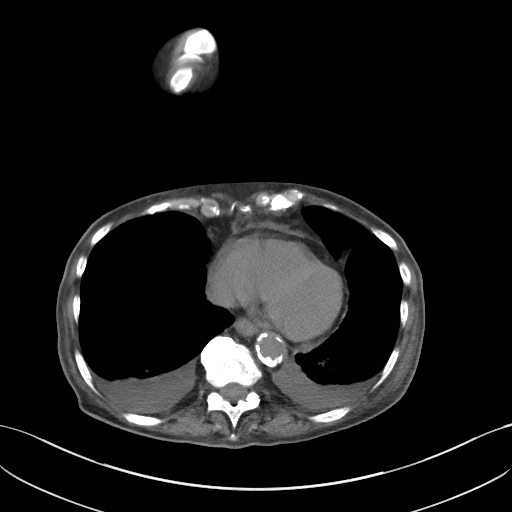

[Series 5: coronal st · coronal · 0.77mm/px · 3 of 86 slices shown]
[im 29/86  soft-tissue]
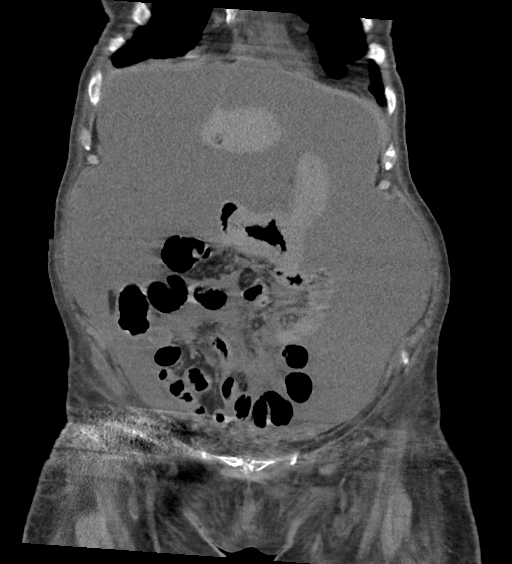
[im 38/86  soft-tissue]
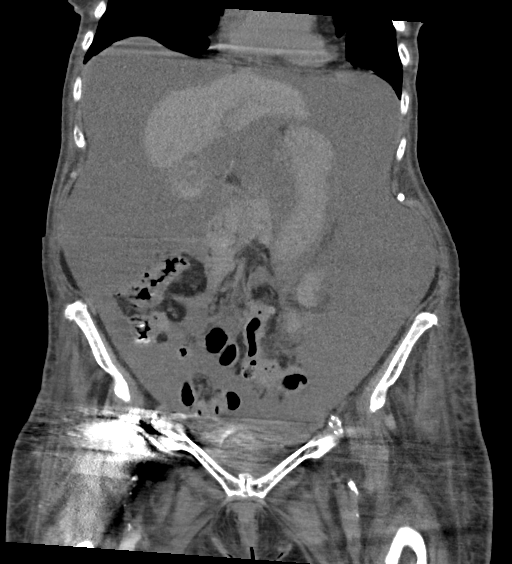
[im 48/86  soft-tissue]
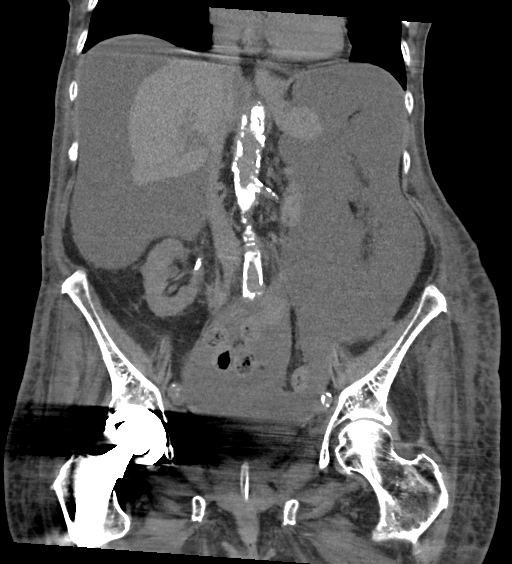

[16 of 46 positions shown; findings below may reference images not displayed]

FINDINGS: Lower chest: Small bilateral layering pleural effusions are new
since 3401. No pericardial effusion. Minor lung base compressive
atelectasis.

Hepatobiliary: Large volume ascites with simple fluid density. Small
cirrhotic appearing liver which has changed in morphology since
3401. No discrete liver lesion is evident in the absence of
contrast. There is a 10 millimeter gallstone identified on series 2,
image 27. There is decreased gallbladder detail due to ascites.

Pancreas: Mild chronic calcific pancreatitis is stable.

Spleen: Spleen size remains normal.

Adrenals/Urinary Tract: Negative adrenal glands. There are bilateral
renal vascular calcifications. Noncontrast kidneys appears stable
since 3401. There are chronic left gonadal vein phleboliths. The
ureters are difficult to delineate. There is a urethral catheter in
the bladder which is poorly delineated due to a combination of
ascites and streak artifact from right hip arthroplasty.

Stomach/Bowel: No dilated large or small bowel loops. The stomach is
decompressed. Individual bowel loops are difficult to delineate due
to the large volume ascites. No pneumoperitoneum.

Vascular/Lymphatic: Severe Aortoiliac calcified atherosclerosis.
Vascular patency is not evaluated in the absence of IV contrast.

No lymphadenopathy is evident.

Reproductive: Surgically absent.

Other: Moderate to large pelvic ascites.

Musculoskeletal: There is a new small round sclerotic lesion
measuring 4-5 millimeters in the left pedicle of T11 on series 2,
image 8. Underlying chronically heterogeneous bone mineralization,
but multiple other areas of small new sclerotic foci are noted in
the lumbar spine. Right hip arthroplasty is new since 3401. No
destructive osseous lesion identified.
IMPRESSION: 1. Cirrhotic liver with Large Volume Ascites. Small bilateral
layering pleural effusions.
No discrete liver lesion in the absence of IV contrast. Spleen size
remains normal.
2. Multiple small new sclerotic foci in the spine since 3401 are
suspicious for sclerotic bone metastases.
3. Severe Aortic Atherosclerosis ([id number]).
4. Cholelithiasis.

## 2019-09-02 IMAGING — CT CT NECK WITHOUT CONTRAST
3 of 4 series · 13 of 33 positions shown, 16 images · non-contrast
Comparison: None.

CLINICAL DATA: Neck mass with history of malignancy

EXAM:
CT NECK WITHOUT CONTRAST
TECHNIQUE: Multidetector CT imaging of the neck was performed following the
standard protocol without intravenous contrast.

[Series 5: sag neck · sagittal · 0.42mm/px · 5 of 136 slices shown, 6 images]
[im 46/136  bone]
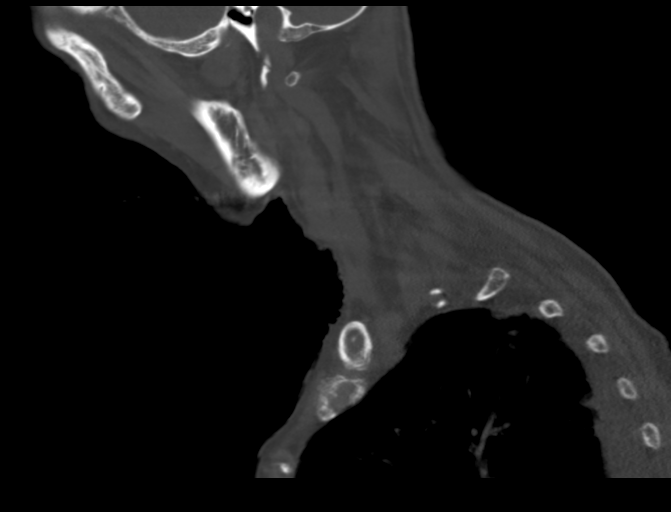
[im 57/136  bone]
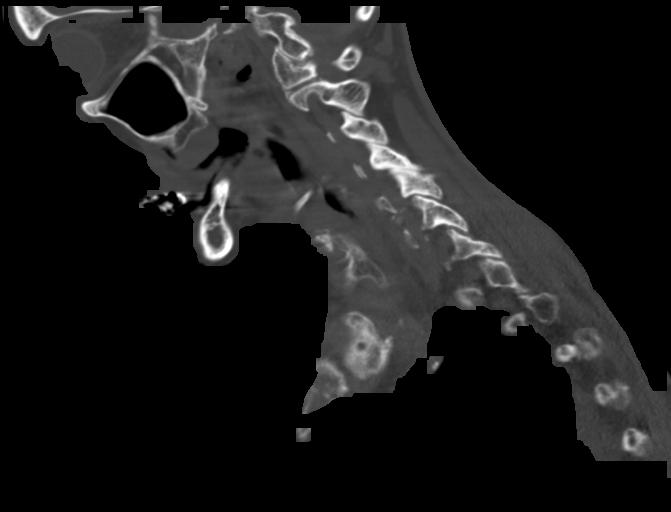
[im 68/136  soft-tissue]
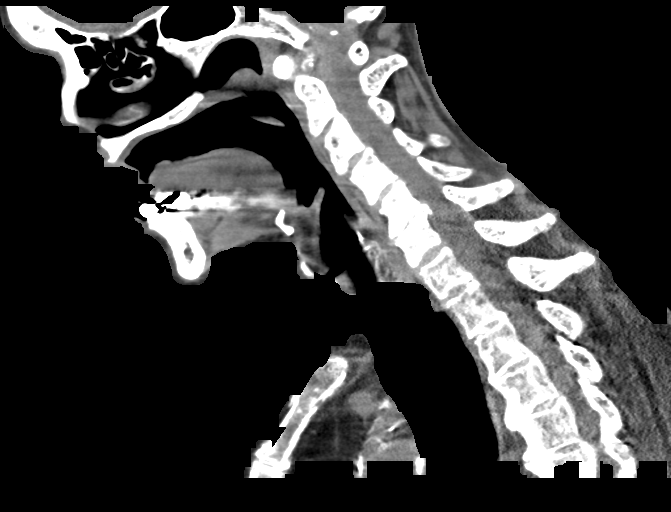
[im 68/136  bone]
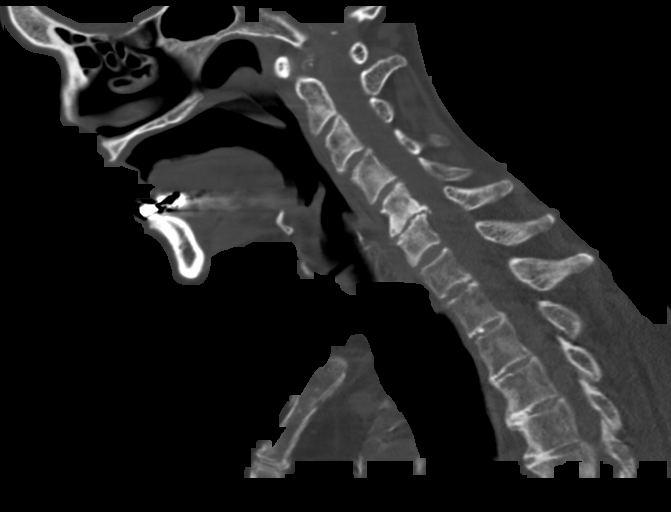
[im 79/136  bone]
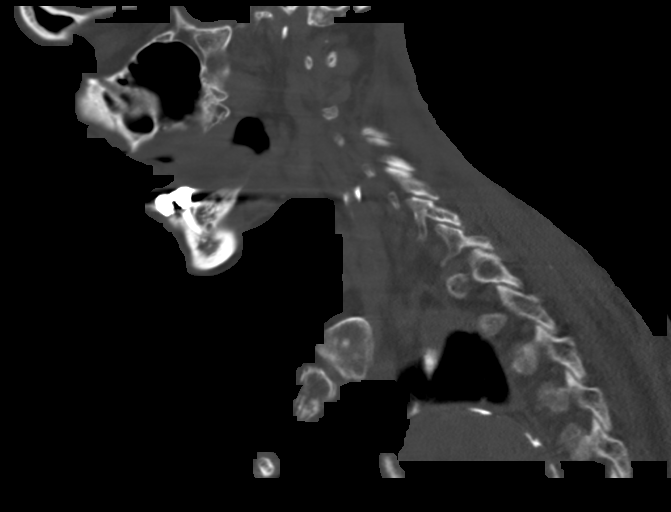
[im 91/136  bone]
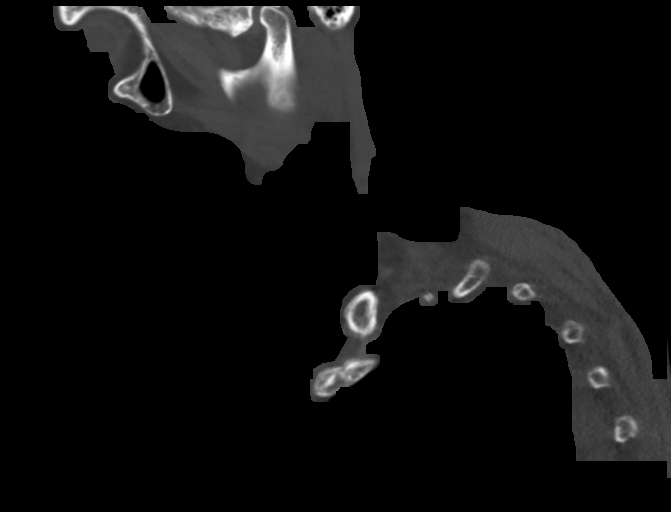

[Series 6: cor neck · coronal · 0.46mm/px · 3 of 96 slices shown]
[im 30/96  bone]
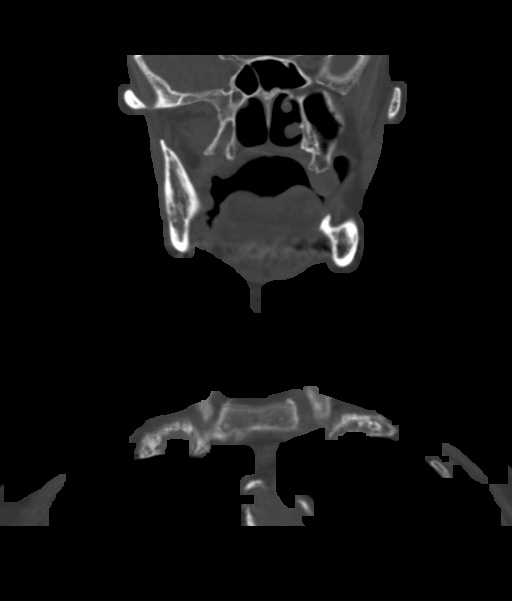
[im 41/96  bone]
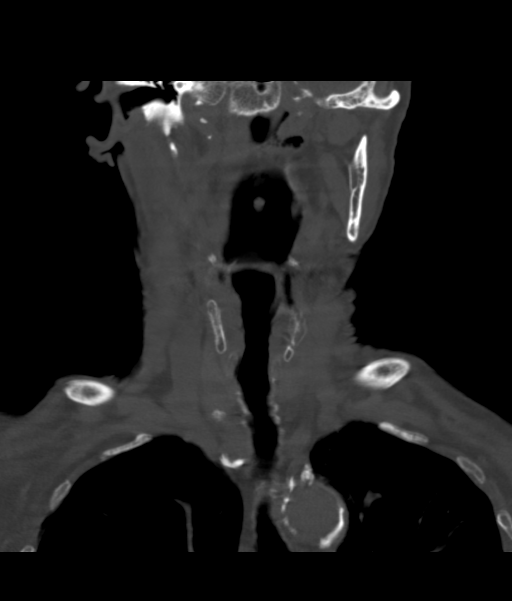
[im 52/96  bone]
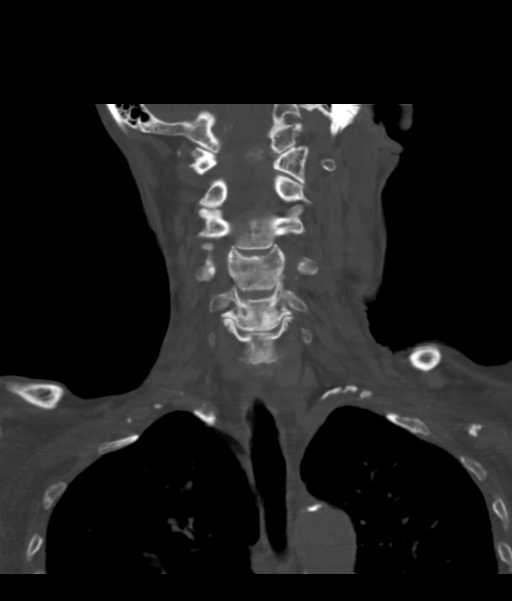

[Series 7: orthogonal ax · axial · 0.39mm/px · z∈[+38,+224]mm · 5 of 146 slices shown, 7 images]
[im 21/146  soft-tissue]
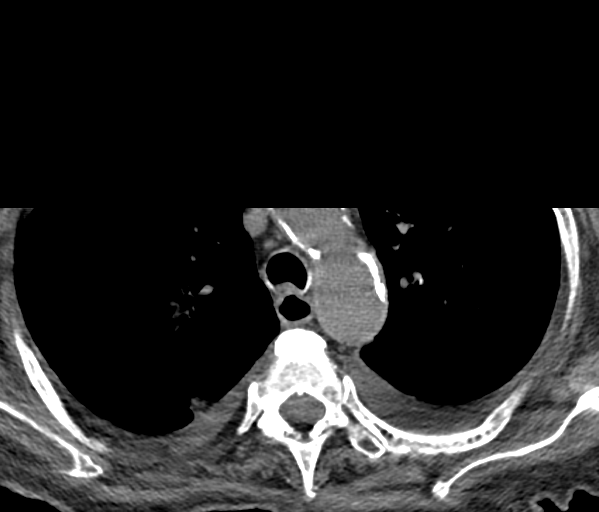
[im 21/146  bone]
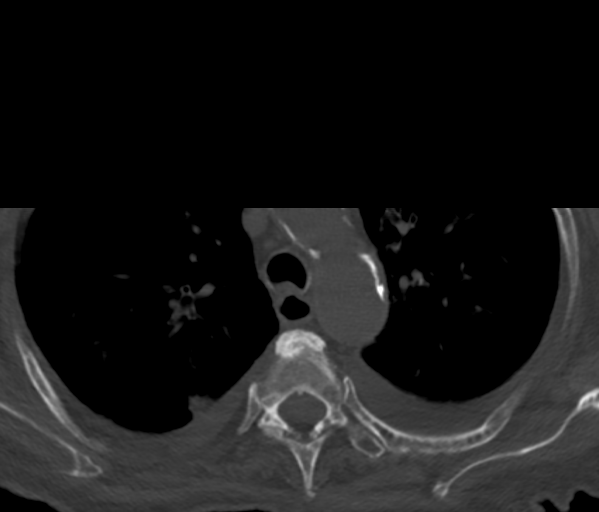
[im 42/146  bone]
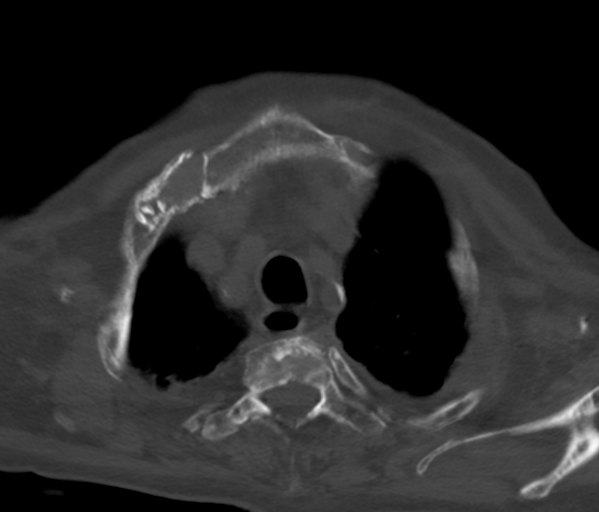
[im 83/146  bone]
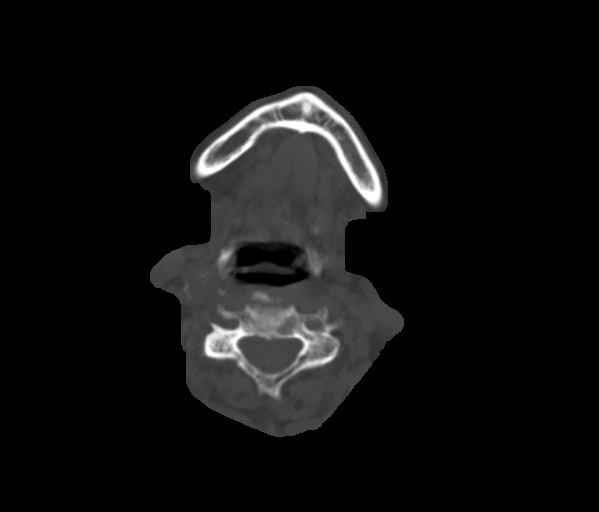
[im 104/146  bone]
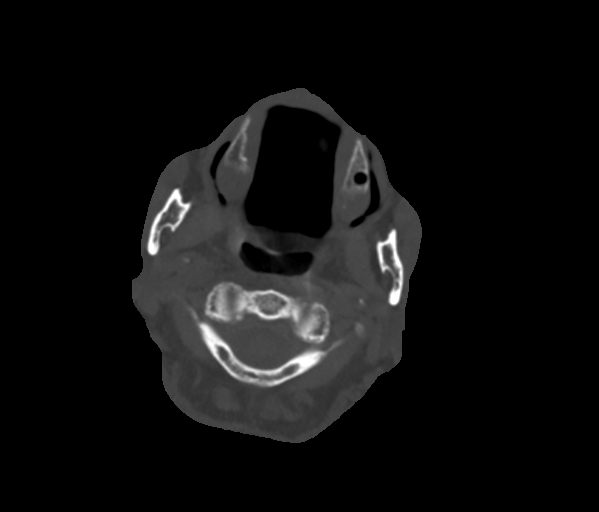
[im 125/146  soft-tissue]
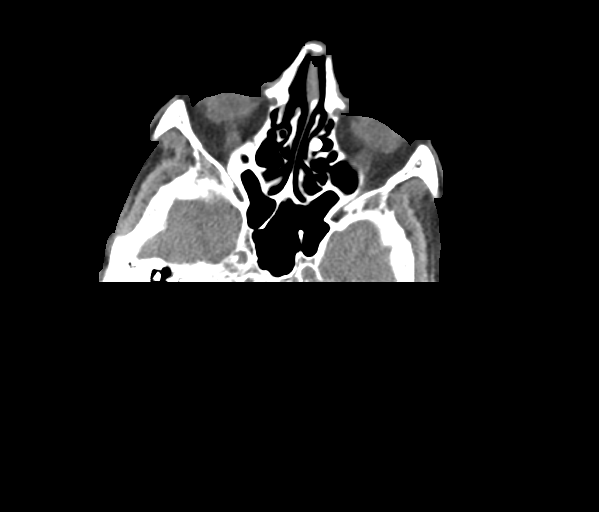
[im 125/146  bone]
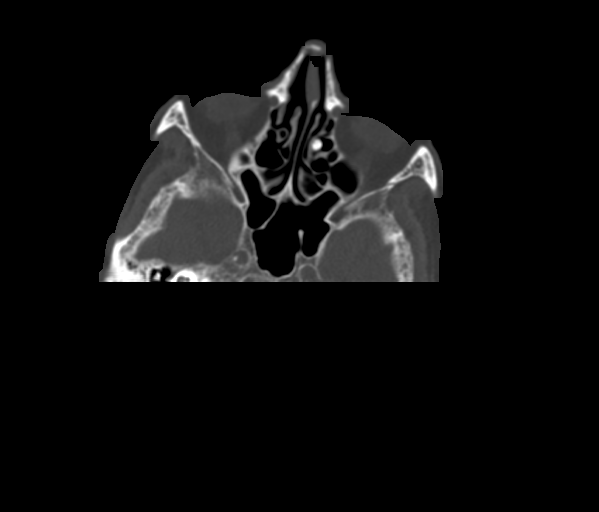

[13 of 33 positions shown; findings below may reference images not displayed]

FINDINGS: Pharynx and larynx: No evidence of mass or swelling

Salivary glands: No inflammation, mass, or stone.

Thyroid: Small gland that is otherwise unremarkable

Lymph nodes: History of colorectal cancer. No evidence of adenopathy
in the neck with close attention to the left supraclavicular fossa.
I do not see any palpable markers to correlate with history of neck
mass.

Vascular: Atherosclerotic calcification.

Limited intracranial: Negative

Visualized orbits: Bilateral cataract resection

Mastoids and visualized paranasal sinuses: Clear

Skeleton: Partially covered sclerotic lesion in the T5 vertebra.
Elsewhere bones are osteopenic.

Upper chest: Small (where visualized) layering pleural effusions.
There is airway thickening and mild ground-glass opacity in the
right upper lobe.
IMPRESSION: 1. No evidence of malignancy in the neck.
2. Sclerotic focus in the T5 vertebra, partially seen but suspicious
for metastatic focus. Most recent staging scans are from 1805, is
there outside staging available?
3. Small pleural effusions and mild nonspecific airspace opacity in
the right upper lobe.
